# Patient Record
Sex: Male | Born: 1990 | Race: Black or African American | Hispanic: No | Marital: Single | Smoking: Never smoker
Health system: Southern US, Community
[De-identification: ages and names within clinical notes are randomized; demographics above are authoritative.]

## PROBLEM LIST (undated history)

## (undated) DIAGNOSIS — F959 Tic disorder, unspecified: Secondary | ICD-10-CM

## (undated) DIAGNOSIS — F79 Unspecified intellectual disabilities: Secondary | ICD-10-CM

## (undated) DIAGNOSIS — Q6689 Other  specified congenital deformities of feet: Secondary | ICD-10-CM

## (undated) DIAGNOSIS — F952 Tourette's disorder: Secondary | ICD-10-CM

## (undated) DIAGNOSIS — R6889 Other general symptoms and signs: Secondary | ICD-10-CM

## (undated) DIAGNOSIS — M419 Scoliosis, unspecified: Secondary | ICD-10-CM

## (undated) HISTORY — PX: EYE MUSCLE SURGERY: SHX370

## (undated) HISTORY — PX: FEMORAL HERNIA REPAIR: SHX632

---
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
## 2012-04-29 ENCOUNTER — Other Ambulatory Visit: Payer: Self-pay | Admitting: Family

## 2012-04-29 DIAGNOSIS — F988 Other specified behavioral and emotional disorders with onset usually occurring in childhood and adolescence: Secondary | ICD-10-CM

## 2012-04-29 MED ORDER — AMPHETAMINE-DEXTROAMPHET ER 30 MG PO CP24: ORAL_CAPSULE | ORAL | Status: DC

## 2012-04-29 NOTE — Telephone Encounter (Signed)
I called Mom, [name], and left her a message that Jurgen's prescription was ready to be picked up.

## 2012-07-17 ENCOUNTER — Other Ambulatory Visit: Payer: Self-pay

## 2012-07-17 DIAGNOSIS — F988 Other specified behavioral and emotional disorders with onset usually occurring in childhood and adolescence: Secondary | ICD-10-CM

## 2012-07-17 MED ORDER — AMPHETAMINE-DEXTROAMPHET ER 30 MG PO CP24: ORAL_CAPSULE | ORAL | Status: DC

## 2012-07-17 NOTE — Telephone Encounter (Signed)
[name] lvm stating that she was in the area taking pt to an appt and wanted to stop by and get Rx. Please call mom at [phone].

## 2012-07-23 ENCOUNTER — Telehealth: Payer: Self-pay

## 2012-07-23 NOTE — Telephone Encounter (Signed)
[name] asking if [name] could mail her the form so that she can apply for another handicap placard. She said that she has lost the placard. Please call [name] at [phone].

## 2012-07-24 NOTE — Telephone Encounter (Signed)
The form is ready. Please call and ask Mom if she wants it mailed to her or if she wants to pick it up. Thanks, Inetta Fermo

## 2012-07-25 NOTE — Telephone Encounter (Signed)
Thank you, I will mail it to her today. TG

## 2012-07-25 NOTE — Telephone Encounter (Signed)
I lvm asking mom to call and let us know.

## 2012-07-25 NOTE — Telephone Encounter (Signed)
Mom called back and would like the form mailed to her home, demographics are correct in the system.

## 2012-08-29 ENCOUNTER — Telehealth: Payer: Self-pay

## 2012-08-29 DIAGNOSIS — F988 Other specified behavioral and emotional disorders with onset usually occurring in childhood and adolescence: Secondary | ICD-10-CM

## 2012-08-29 MED ORDER — AMPHETAMINE-DEXTROAMPHET ER 30 MG PO CP24: ORAL_CAPSULE | ORAL | Status: DC

## 2012-08-29 NOTE — Telephone Encounter (Signed)
Left message on [name]'s answering machine letting her know that the Rx would be maield out Monday and that the letter would not be ready until next week.

## 2012-08-29 NOTE — Telephone Encounter (Addendum)
[name] lvm stating that she would like a Rx for generic Adderall mailed to her address, he is doing well on it. Would like a letter for Medicaid to pay for a motorized scooter for pt. With one leg being shorter then the other this will help him get around easier. Had CAP meeting yesterday and they said if she could get a letter from a provider at our office indicating why he needs it, Medicaid will pay for it. Marylene Land asked that the letter be mailed to her home when it is finished. She can be reached for any questions at [phone].

## 2012-09-02 ENCOUNTER — Encounter: Payer: Self-pay | Admitting: Family

## 2012-09-03 NOTE — Telephone Encounter (Signed)
09/03/2012 - mailed completed letter to home address as requested.

## 2012-09-22 ENCOUNTER — Telehealth: Payer: Self-pay

## 2012-09-22 DIAGNOSIS — G8310 Monoplegia of lower limb affecting unspecified side: Secondary | ICD-10-CM

## 2012-09-22 NOTE — Telephone Encounter (Signed)
Order has been placed. Marcelino Duster, would you follow up on this on Tuesday? Thanks, Inetta Fermo

## 2012-09-22 NOTE — Telephone Encounter (Signed)
Angie lvm stating that she spoke w [facility]. She said that they told her to call and have Dr.H fax over the order for pt to have PT eval. After the eval is complete, they will write the recommendation for pt to get motorized scooter. The fax number they gave her to have orders sent is [phone], the phone number is [phone]. Angie can be reached at [phone].

## 2012-09-23 NOTE — Telephone Encounter (Signed)
I spoke with Alcario Drought at [facility] and she confirmed that she was able to see the order that was put in for this patient by Sula Soda. MB

## 2012-09-26 ENCOUNTER — Other Ambulatory Visit: Payer: Self-pay | Admitting: Family

## 2012-09-26 DIAGNOSIS — G2569 Other tics of organic origin: Secondary | ICD-10-CM

## 2012-09-30 ENCOUNTER — Telehealth: Payer: Self-pay

## 2012-09-30 DIAGNOSIS — G2569 Other tics of organic origin: Secondary | ICD-10-CM

## 2012-09-30 MED ORDER — CLONIDINE HCL 0.1 MG PO TABS: ORAL_TABLET | ORAL | Status: DC

## 2012-09-30 NOTE — Telephone Encounter (Signed)
Rx was sent in electronically. TG 

## 2012-09-30 NOTE — Telephone Encounter (Signed)
[name], mom, called me back and scheduled appt for 10/07/12. I told her to check with her pharmacy later today for the refill.

## 2012-09-30 NOTE — Telephone Encounter (Signed)
[name] lvm stating that pt needs refill on Clonidine and that she will p/u when ready. She also stated that she knew he was due for an office visit and asked that it be scheduled on a Monday or Wednesday after 3:00 om. Called mom and lvm letting her know that Inetta Fermo does not have appts after 3:00 pm on those days but does have a few 2:45 pm appts on Thursdays. I asked her to call me so that we can schedule the appt.

## 2012-10-07 ENCOUNTER — Ambulatory Visit (INDEPENDENT_AMBULATORY_CARE_PROVIDER_SITE_OTHER): Payer: Medicaid Other | Admitting: Family

## 2012-10-07 ENCOUNTER — Encounter: Payer: Self-pay | Admitting: Family

## 2012-10-07 VITALS — BP 134/84 | HR 82 | Ht <= 58 in | Wt 141.0 lb

## 2012-10-07 DIAGNOSIS — G8310 Monoplegia of lower limb affecting unspecified side: Secondary | ICD-10-CM

## 2012-10-07 DIAGNOSIS — G4700 Insomnia, unspecified: Secondary | ICD-10-CM

## 2012-10-07 DIAGNOSIS — H905 Unspecified sensorineural hearing loss: Secondary | ICD-10-CM

## 2012-10-07 DIAGNOSIS — G2569 Other tics of organic origin: Secondary | ICD-10-CM

## 2012-10-07 DIAGNOSIS — F988 Other specified behavioral and emotional disorders with onset usually occurring in childhood and adolescence: Secondary | ICD-10-CM

## 2012-10-07 NOTE — Progress Notes (Signed)
Patient: [name] MRN: [id number] Sex: male DOB: [birth year]  Provider: Elveria Rising, NP Location of Care: [facility]  Note type: Routine return visit  History of Present Illness: Referral Source: Dr. 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 His tics have not been problematic. He is taking Clonidine for the motor tics and for difficulty sleeping. He takes Adderall for his attentional difficulties.  He lives in an apartment with a roommate and is supervised by a Merchandiser, retail. He uses [employer] transportation.  He is enrolled in a [employer] at a program called [employer].  He has been healthy since last seen.  [name] has difficulty with ambulation and is awaiting an evaluation at physical therapy for a motorized scooter.   Review of Systems: 12 system review was unremarkable  No past medical history on file. Hospitalizations: yes, Head Injury: no, Nervous System Infections: no, Immunizations up to date: yes Past Medical History Comments: has been hospitalized in the past for surgeries.  Surgical History Past Surgical History  Procedure Laterality Date  . Foot surgery Right 2002  . Eye muscle surgery Bilateral 1998 and 2002  . Femoral hernia repair      Done when he was an infant  . Harrington rod surgery  2003    For Scoliosis    Family History family history includes Breast cancer in his maternal grandmother; Prostate cancer in his maternal grandfather. Family History is negative migraines, seizures, cognitive impairment, blindness, deafness, birth defects, chromosomal disorder, autism.  Social History History    Social History  . Marital Status: Single    Spouse Name: N/A    Number of Children: N/A  . Years of Education: N/A   Social History Main Topics  . Smoking status: Never Smoker   . Smokeless tobacco: None  . Alcohol Use: No  . Drug Use: No  . Sexual Activity: None   Other Topics Concern  . None   Social History Narrative  . None   Educational level: enrolled in day program at [employer] Attending: Haroldine Laws  Occupation: Student  Living with maternal cousin  School comments [name] is taking Engineer, maintenance (IT) Studies at [employer].  Current Outpatient Prescriptions on File Prior to Visit  Medication Sig Dispense Refill  . amphetamine-dextroamphetamine (ADDERALL XR) 30 MG 24 hr capsule Take one by mouth every morning  30 capsule  0  . cloNIDine (CATAPRES) 0.1 MG tablet TAKE 3 TABLETS BY MOUTH EVERY NIGHT AT BEDTIME  90 tablet  0   No current facility-administered medications on file prior to visit.   The medication list was reviewed and reconciled. All changes or newly prescribed medications were explained.  A complete medication list was provided to the patient/caregiver.  No Known Allergies  Physical Exam BP 134/84  Pulse 82  Ht 4' 7.75" (1.416 m)  Wt 141 lb (63.957 kg)  BMI 31.9 kg/m2 General: alert, well developed, well nourished short statured young man, in no acute distress, right-handed Head: normocephalic, no dysmorphic features Ears, Nose and Throat: Otoscopic: tympanic membranes normal .  Pharynx: oropharynx is pink without exudates or tonsillar hypertrophy. Neck:  short neck, diminished range of motion, no cranial or cervical bruits Respiratory: auscultation clear Cardiovascular: no murmurs,  pulses are normal Musculoskeletal: right hemiatrophy most evident in the distal leg which is wasted. He wears shoes buildup. He is repaired thoracolumbar scoliosis Skin: no rashes or neurocutaneous lesions Trunk:  he has a healed gastrostomy scar in his abdomen..  Neurologic  Exam  Mental Status: alert; Oriented to place and name.He is talkative and attentive during the examination. His speech was rapid but he has no dysphasia or dysarthria Cranial Nerves: visual fields are full to double simultaneous stimuli; extraocular movements are full and conjugate; pupils are round reactive to light; funduscopic examination shows sharp disc margins with normal vessels; symmetric facial strength; midline tongue and uvula; hearing is normal and symmetric Motor: He has mild weakness in the right leg distally but fairly good proximal strength. He has normal strength In both arms and left leg. His fine motor skills are equal. 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 He has difficulty with ambulation and needs a scooter for mobility. He has an upcoming physical therapy evaluation for that. He will continue his medications without change and will return for follow up in 1 year or sooner if needed.

## 2012-10-09 ENCOUNTER — Encounter: Payer: Self-pay | Admitting: Family

## 2012-10-09 DIAGNOSIS — G8310 Monoplegia of lower limb affecting unspecified side: Secondary | ICD-10-CM | POA: Insufficient documentation

## 2012-10-09 DIAGNOSIS — G4700 Insomnia, unspecified: Secondary | ICD-10-CM | POA: Insufficient documentation

## 2012-10-09 DIAGNOSIS — H905 Unspecified sensorineural hearing loss: Secondary | ICD-10-CM | POA: Insufficient documentation

## 2012-10-09 DIAGNOSIS — F988 Other specified behavioral and emotional disorders with onset usually occurring in childhood and adolescence: Secondary | ICD-10-CM | POA: Insufficient documentation

## 2012-10-09 DIAGNOSIS — G2569 Other tics of organic origin: Secondary | ICD-10-CM | POA: Insufficient documentation

## 2012-10-09 NOTE — Patient Instructions (Signed)
Continue your medications without change. Let me know if you need anything else to help get the motorized scooter.  Plan to return for follow up in 1 year or sooner if needed.

## 2012-10-24 ENCOUNTER — Telehealth: Payer: Self-pay

## 2012-10-24 DIAGNOSIS — F988 Other specified behavioral and emotional disorders with onset usually occurring in childhood and adolescence: Secondary | ICD-10-CM

## 2012-10-24 MED ORDER — AMPHETAMINE-DEXTROAMPHET ER 30 MG PO CP24: ORAL_CAPSULE | ORAL | Status: DC

## 2012-10-24 NOTE — Telephone Encounter (Signed)
[name], mom, lvm asking for Rx for generic Adderall be mailed to the home. I called mom and lvm to let her know we received the message and will be mailing it on Monday.

## 2012-11-06 ENCOUNTER — Other Ambulatory Visit: Payer: Self-pay

## 2012-11-06 DIAGNOSIS — G2569 Other tics of organic origin: Secondary | ICD-10-CM

## 2012-11-06 MED ORDER — CLONIDINE HCL 0.1 MG PO TABS: ORAL_TABLET | ORAL | Status: DC

## 2012-11-06 NOTE — Telephone Encounter (Signed)
Angie lvm asking for refills on child's Clonidine 0.1 mg. I called mom and told her to check with her pharmacy later today.

## 2012-11-27 ENCOUNTER — Ambulatory Visit: Payer: Self-pay | Admitting: Rehabilitative and Restorative Service Providers"

## 2012-12-09 ENCOUNTER — Telehealth: Payer: Self-pay

## 2012-12-09 DIAGNOSIS — F988 Other specified behavioral and emotional disorders with onset usually occurring in childhood and adolescence: Secondary | ICD-10-CM

## 2012-12-09 MED ORDER — AMPHETAMINE-DEXTROAMPHET ER 30 MG PO CP24: ORAL_CAPSULE | ORAL | Status: DC

## 2012-12-09 NOTE — Telephone Encounter (Signed)
Angie called and would like Rx for child's generic Adderall mailed to her home. He is doing well on it. I told her that it would be mailed today.

## 2013-01-15 ENCOUNTER — Telehealth: Payer: Self-pay

## 2013-01-15 DIAGNOSIS — F988 Other specified behavioral and emotional disorders with onset usually occurring in childhood and adolescence: Secondary | ICD-10-CM

## 2013-01-15 MED ORDER — AMPHETAMINE-DEXTROAMPHET ER 30 MG PO CP24: ORAL_CAPSULE | ORAL | Status: DC

## 2013-01-15 NOTE — Telephone Encounter (Signed)
[name], mom, lvm stating that pt needed Rx for generic Adderall XR 30 mg. She said that he is doing well on it. I will call mom when ready for p/u at [phone].

## 2013-01-15 NOTE — Telephone Encounter (Signed)
I called [name] and let her know Rx was ready for p/u and told her our hours of operations.

## 2013-01-30 ENCOUNTER — Encounter (HOSPITAL_COMMUNITY): Payer: Self-pay | Admitting: Emergency Medicine

## 2013-01-30 ENCOUNTER — Emergency Department (HOSPITAL_COMMUNITY): Payer: Medicaid Other

## 2013-01-30 ENCOUNTER — Telehealth: Payer: Self-pay | Admitting: *Deleted

## 2013-01-30 ENCOUNTER — Inpatient Hospital Stay (HOSPITAL_COMMUNITY)
Admission: EM | Admit: 2013-01-30 | Discharge: 2013-02-01 | DRG: 071 | Disposition: A | Discharge status: Home / Self Care | Payer: Medicaid Other | Attending: Internal Medicine | Admitting: Internal Medicine

## 2013-01-30 DIAGNOSIS — Q7649 Other congenital malformations of spine, not associated with scoliosis: Secondary | ICD-10-CM

## 2013-01-30 DIAGNOSIS — R4182 Altered mental status, unspecified: Secondary | ICD-10-CM

## 2013-01-30 DIAGNOSIS — M4120 Other idiopathic scoliosis, site unspecified: Secondary | ICD-10-CM | POA: Diagnosis present

## 2013-01-30 DIAGNOSIS — G9340 Encephalopathy, unspecified: Principal | ICD-10-CM | POA: Diagnosis present

## 2013-01-30 DIAGNOSIS — I10 Essential (primary) hypertension: Secondary | ICD-10-CM | POA: Diagnosis present

## 2013-01-30 DIAGNOSIS — Q6689 Other  specified congenital deformities of feet: Secondary | ICD-10-CM

## 2013-01-30 DIAGNOSIS — G4700 Insomnia, unspecified: Secondary | ICD-10-CM | POA: Diagnosis present

## 2013-01-30 DIAGNOSIS — F988 Other specified behavioral and emotional disorders with onset usually occurring in childhood and adolescence: Secondary | ICD-10-CM | POA: Diagnosis present

## 2013-01-30 DIAGNOSIS — F952 Tourette's disorder: Secondary | ICD-10-CM | POA: Diagnosis present

## 2013-01-30 DIAGNOSIS — G8310 Monoplegia of lower limb affecting unspecified side: Secondary | ICD-10-CM

## 2013-01-30 DIAGNOSIS — R030 Elevated blood-pressure reading, without diagnosis of hypertension: Secondary | ICD-10-CM

## 2013-01-30 DIAGNOSIS — F411 Generalized anxiety disorder: Secondary | ICD-10-CM | POA: Diagnosis present

## 2013-01-30 HISTORY — DX: Scoliosis, unspecified: M41.9

## 2013-01-30 HISTORY — DX: Other specified congenital deformities of feet: Q66.89

## 2013-01-30 HISTORY — DX: Other general symptoms and signs: R68.89

## 2013-01-30 HISTORY — DX: Tourette's disorder: F95.2

## 2013-01-30 HISTORY — DX: Tic disorder, unspecified: F95.9

## 2013-01-30 LAB — CBC WITH DIFFERENTIAL/PLATELET
Basophils Relative: 0 % (ref 0–1)
Eosinophils Absolute: 0 10*3/uL (ref 0.0–0.7)
Eosinophils Relative: 0 % (ref 0–5)
Lymphocytes Relative: 15 % (ref 12–46)
Lymphs Abs: 1.5 10*3/uL (ref 0.7–4.0)
MCH: 30.4 pg (ref 26.0–34.0)
MCHC: 33.2 g/dL (ref 30.0–36.0)
MCV: 91.6 fL (ref 78.0–100.0)
Monocytes Relative: 10 % (ref 3–12)
Neutro Abs: 7.2 10*3/uL (ref 1.7–7.7)
Neutrophils Relative %: 74 % (ref 43–77)
Platelets: 258 10*3/uL (ref 150–400)
RDW: 12.6 % (ref 11.5–15.5)
WBC: 9.7 10*3/uL (ref 4.0–10.5)

## 2013-01-30 LAB — COMPREHENSIVE METABOLIC PANEL
ALT: 11 U/L (ref 0–53)
Albumin: 4.6 g/dL (ref 3.5–5.2)
BUN: 13 mg/dL (ref 6–23)
Calcium: 9.3 mg/dL (ref 8.4–10.5)
Chloride: 101 mEq/L (ref 96–112)
GFR calc Af Amer: >90 mL/min (ref >90)
GFR calc non Af Amer: >90 mL/min (ref >90)
Glucose, Bld: 88 mg/dL (ref 70–99)
Potassium: 3.9 mEq/L (ref 3.5–5.1)
Sodium: 139 mEq/L (ref 135–145)
Total Protein: 7.9 g/dL (ref 6.0–8.3)

## 2013-01-30 LAB — GLUCOSE, CAPILLARY: Glucose-Capillary: 89 mg/dL (ref 70–99)

## 2013-01-30 MED ORDER — VANCOMYCIN HCL 1000 MG IV SOLR: 15.0000 mg/kg | Freq: Once | INTRAVENOUS | Status: DC

## 2013-01-30 MED ORDER — HYDROMORPHONE HCL PF 1 MG/ML IJ SOLN: 0.5000 mg | INTRAMUSCULAR | Status: DC | PRN

## 2013-01-30 MED ORDER — DEXTROSE 5 % IV SOLN
2.0000 g | Freq: Once | INTRAVENOUS | Status: AC
  Administered 2013-01-30: 2 g via INTRAVENOUS
  Filled 2013-01-30 (×2): qty 2

## 2013-01-30 MED ORDER — VANCOMYCIN HCL IN DEXTROSE 1-5 GM/200ML-% IV SOLN
1000.0000 mg | Freq: Once | INTRAVENOUS | Status: DC
  Filled 2013-01-30: qty 200

## 2013-01-30 MED ORDER — MIDAZOLAM HCL 5 MG/ML IJ SOLN: 2.5000 mg | Freq: Once | INTRAMUSCULAR | Status: DC

## 2013-01-30 MED ORDER — HYDRALAZINE HCL 20 MG/ML IJ SOLN: 10.0000 mg | Freq: Four times a day (QID) | INTRAMUSCULAR | Status: DC | PRN

## 2013-01-30 MED ORDER — DEXTROSE 5 % IV SOLN
2.0000 g | INTRAVENOUS | Status: DC
  Filled 2013-01-30: qty 2

## 2013-01-30 MED ORDER — OXYCODONE HCL 5 MG PO TABS: 5.0000 mg | ORAL_TABLET | ORAL | Status: DC | PRN

## 2013-01-30 MED ORDER — ACETAMINOPHEN 325 MG PO TABS: 650.0000 mg | ORAL_TABLET | Freq: Four times a day (QID) | ORAL | Status: DC | PRN

## 2013-01-30 MED ORDER — ONDANSETRON HCL 4 MG/2ML IJ SOLN: 4.0000 mg | Freq: Four times a day (QID) | INTRAMUSCULAR | Status: DC | PRN

## 2013-01-30 MED ORDER — LORAZEPAM 2 MG/ML IJ SOLN: 2.0000 mg | Freq: Once | INTRAMUSCULAR | Status: DC

## 2013-01-30 MED ORDER — SODIUM CHLORIDE 0.9 % IV BOLUS (SEPSIS)
1000.0000 mL | Freq: Once | INTRAVENOUS | Status: AC
  Administered 2013-01-30: 1000 mL via INTRAVENOUS

## 2013-01-30 MED ORDER — LORAZEPAM 2 MG/ML IJ SOLN
1.0000 mg | Freq: Once | INTRAMUSCULAR | Status: AC
  Administered 2013-01-30: 1 mg via INTRAVENOUS
  Filled 2013-01-30: qty 1

## 2013-01-30 MED ORDER — ACETAMINOPHEN 650 MG RE SUPP: 650.0000 mg | Freq: Four times a day (QID) | RECTAL | Status: DC | PRN

## 2013-01-30 MED ORDER — DEXTROSE-NACL 5-0.45 % IV SOLN
INTRAVENOUS | Status: DC
  Administered 2013-01-30: 1000 mL via INTRAVENOUS

## 2013-01-30 MED ORDER — ONDANSETRON HCL 4 MG PO TABS: 4.0000 mg | ORAL_TABLET | Freq: Four times a day (QID) | ORAL | Status: DC | PRN

## 2013-01-30 MED ORDER — VANCOMYCIN HCL IN DEXTROSE 750-5 MG/150ML-% IV SOLN
750.0000 mg | Freq: Three times a day (TID) | INTRAVENOUS | Status: DC
  Administered 2013-01-31 – 2013-02-01 (×5): 750 mg via INTRAVENOUS
  Filled 2013-01-30 (×8): qty 150

## 2013-01-30 MED ORDER — ALUM & MAG HYDROXIDE-SIMETH 200-200-20 MG/5ML PO SUSP: 30.0000 mL | Freq: Four times a day (QID) | ORAL | Status: DC | PRN

## 2013-01-30 MED ORDER — ACYCLOVIR SODIUM 50 MG/ML IV SOLN
500.0000 mg | Freq: Three times a day (TID) | INTRAVENOUS | Status: DC
  Administered 2013-01-30 – 2013-02-01 (×5): 500 mg via INTRAVENOUS
  Filled 2013-01-30 (×8): qty 10

## 2013-01-30 NOTE — Progress Notes (Signed)
ANTIBIOTIC CONSULT NOTE - INITIAL  Pharmacy Consult for Vancomycin  Indication: R/O meningitis  No Known Allergies  Patient Measurements: Height: 4' 7.91" (142 cm) Weight: 141 lb 1.5 oz (64 kg) IBW/kg (Calculated) : 40.58  Vital Signs: Temp: 98.1 F (36.7 C) (12/26 2100) Temp src: Axillary (12/26 2100) BP: 142/90 mmHg (12/26 2100) Pulse Rate: 120 (12/26 2100)  Labs:  Recent Labs  01/30/13 1702  WBC 9.7  HGB 16.3  PLT 258  CREATININE 0.78   Estimated Creatinine Clearance: 102.4 ml/min (by C-G formula based on Cr of 0.78). No results found for this basename: VANCOTROUGH, VANCOPEAK, VANCORANDOM, GENTTROUGH, GENTPEAK, GENTRANDOM, TOBRATROUGH, TOBRAPEAK, TOBRARND, AMIKACINPEAK, AMIKACINTROU, AMIKACIN,  in the last 72 hours   Medical History: Past Medical History  Diagnosis Date  . Tourette's syndrome   . Tic disorder   . Scoliosis   . Hemiatrophy of right leg   . Club foot     Right   Assessment: 22 y/o M with AMS, to start empiric antibiotics for r/o meningitis. Was supposed to receive 1000 mg Vancomycin in the ED, but never received. Labs as above.   Goal of Therapy:  Vancomycin trough level 15-20 mcg/ml  Plan:  -Will start vancomycin 750 mg IV q8h -Ceftriaxone per MD (would recommend change to q12h from q24h) -Acyclovir per previous note -Drug levels as indicated  Thank you for allowing me to take part in this patient's care,  Abran Duke, PharmD Clinical Pharmacist Phone: [phone] Pager: [phone] 01/30/2013 11:30 PM

## 2013-01-30 NOTE — Telephone Encounter (Signed)
[name] the patient's mom called and stated that she has taken him to [facility] and he has just completed a CT head, his blood pressure was a little high and they are waiting for the doctor to come back in. Mom states that on yesterday he was complaining of eye pain so she got him some eye drops and then today about 3:30 pm or so his motor skill began to decline rapidly, he was unable to walk his eyes were rolling back in his head. Mom was calling as an FYI for Dr. Sharene Skeans in case the hospital tried to contact him about this he would already be aware. [name] can be reached at [phone] if Dr. Sharene Skeans would like to speak with her regarding this matter.     Thanks,  Belenda Cruise.

## 2013-01-30 NOTE — H&P (Signed)
[facility] History and Physical  [name] ZOX:[id number] DOB: [birth year] DOA: 01/30/2013  Referring physician:   EDP PCP: Provider Not In System  Specialists:  Dr. Amada Jupiter Neurology  Chief Complaint:  Decreased Responsiveness  HPI: [name] is a 22 y.o. male with a history of Tourettes Syndrome who was brought to the ED due to sudden onset of "babbling" and not making sense followed by complaints that he felt like he was drowning and choking.    Then over time in the afternoon , he had a decrease in responsiveness described by his family as not being able to verbalize and not being able to walk or move.   When he was attempting to walk he was described as tightening up all over his body, and he appeared by report to have small seizure like activity.  Per his mother he has not had any history of seizures, and he has not had fevers or chills.  Yesterday, he did have complaints of eye pain.  He was taken to the ED and his Neurologist was contacted Dr. Sharene Skeans and he was seen by Neurology in the ED, by Dr. Amada Jupiter.   A chest X-ray and Ct scan of the Head were performed and were negative for acute findings.  An LP was attempted by the EDp and radiology to no avail and patient was placed on Empiric Antibiotic therapy of IV Vancomycin Rocephin and Acyclovir.      Review of Systems: The patient denies anorexia, fever, chills, headaches, weight loss, vision loss, diplopia, dizziness, decreased hearing, rhinitis, hoarseness, chest pain, syncope, dyspnea on exertion, peripheral edema, balance deficits, cough, hemoptysis, abdominal pain, nausea, vomiting, diarrhea, constipation, hematemesis, melena, hematochezia, severe indigestion/heartburn, dysuria, hematuria, incontinence, muscle weakness, suspicious skin lesions, transient blindness, difficulty walking, depression, unusual weight change, abnormal bleeding, enlarged lymph nodes, angioedema, and breast masses.    Past Medical History:      Tourette's Syndrome  Tic Disorder  Right Hemiatrophy  Scoliosis  Right Club Foot  Insomnia     Past Surgical History  Procedure Laterality Date  . Foot surgery Right 2002  . Eye muscle surgery Bilateral 1998 and 2002  . Femoral hernia repair      Done when he was an infant  . Harrington rod surgery  2003    For Scoliosis    Prior to Admission medications   Medication Sig Start Date End Date Taking? Authorizing Provider  amphetamine-dextroamphetamine (ADDERALL XR) 30 MG 24 hr capsule Take one by mouth every morning 01/15/13  Yes Elveria Rising, NP  cloNIDine (CATAPRES) 0.1 MG tablet Take 0.3 mg by mouth at bedtime.   Yes Historical Provider, MD  Melatonin 5 MG TABS Take 5 mg by mouth at bedtime.   Yes Historical Provider, MD  Multiple Vitamin (MULTIVITAMIN WITH MINERALS) TABS tablet Take 1 tablet by mouth daily.   Yes Historical Provider, MD  Tetrahydroz-Glyc-Hyprom-PEG (VISINE MAXIMUM REDNESS RELIEF) 0.05-0.2-0.36-1 % SOLN Apply 2 drops to eye daily as needed (for dry eyes).   Yes Historical Provider, MD    No Known Allergies  Social History:  reports that he has never smoked. He does not have any smokeless tobacco history on file. He reports that he does not drink alcohol or use illicit drugs.     Family History  Problem Relation Age of Onset  . Breast cancer Maternal Grandmother     Died in her mid 45's  . Prostate cancer Maternal Grandfather     Died in his 54's    (  be sure to complete)   Physical Exam:  GEN:  Pleasant  22 y.o. male  examined  and in no acute distress; cooperative with exam Filed Vitals:   01/30/13 1900 01/30/13 1930 01/30/13 2000 01/30/13 2100  BP:  86/54 96/80 142/90  Pulse:  130 142 120  Temp:    98.1 F (36.7 C)  TempSrc:    Axillary  Resp:  24 30 22   Height: 4' 7.91" (1.42 m)     Weight: 64 kg (141 lb 1.5 oz)     SpO2:  94% 95% 96%   Blood pressure 142/90, pulse 120, temperature 98.1 F (36.7 C), temperature source Axillary,  resp. rate 22, height 4' 7.91" (1.42 m), weight 64 kg (141 lb 1.5 oz), SpO2 96.00%. PSYCH: He is alert and oriented x3; does not appear anxious does not appear depressed; affect is normal HEENT: Normocephalic and Atraumatic, Mucous membranes pink; PERRLA; EOM intact; Fundi:  Benign;  No scleral icterus, Nares: Patent, Oropharynx: Clear,  Fair Dentition, Neck:  FROM, no cervical lymphadenopathy nor thyromegaly or carotid bruit; no JVD; Breasts:: Not examined CHEST WALL: No tenderness CHEST: Normal respiration, clear to auscultation bilaterally HEART: Regular rate and rhythm; no murmurs rubs or gallops BACK: No kyphosis or scoliosis; no CVA tenderness ABDOMEN: Positive Bowel Sounds, soft non-tender; no masses, no organomegaly. Rectal Exam: Not done EXTREMITIES: No cyanosis, clubbing or edema; no ulcerations. Genitalia: not examined PULSES: 2+ and symmetric SKIN: Normal hydration no rash or ulceration CNS: Cranial nerves 2-12 grossly intact no focal neurologic deficit except for deafness in right Ear    Labs on Admission:  Basic Metabolic Panel:  Recent Labs Lab 01/30/13 1702  NA 139  K 3.9  CL 101  CO2 24  GLUCOSE 88  BUN 13  CREATININE 0.78  CALCIUM 9.3   Liver Function Tests:  Recent Labs Lab 01/30/13 1702  AST 20  ALT 11  ALKPHOS 68  BILITOT 0.3  PROT 7.9  ALBUMIN 4.6   No results found for this basename: LIPASE, AMYLASE,  in the last 168 hours No results found for this basename: AMMONIA,  in the last 168 hours CBC:  Recent Labs Lab 01/30/13 1702  WBC 9.7  NEUTROABS 7.2  HGB 16.3  HCT 49.1  MCV 91.6  PLT 258   Cardiac Enzymes: No results found for this basename: CKTOTAL, CKMB, CKMBINDEX, TROPONINI,  in the last 168 hours  BNP (last 3 results) No results found for this basename: PROBNP,  in the last 8760 hours CBG:  Recent Labs Lab 01/30/13 1532  GLUCAP 89    Radiological Exams on Admission: Ct Head Wo Contrast  01/30/2013   CLINICAL DATA:   Altered mental status, slurred speech  EXAM: CT HEAD WITHOUT CONTRAST  TECHNIQUE: Contiguous axial images were obtained from the base of the skull through the vertex without intravenous contrast.  COMPARISON:  None.  FINDINGS: The ventricles are normal in size and position. There is no intracranial hemorrhage nor intracranial mass effect. There is no evidence of an evolving ischemic infarction. The cerebellum and brainstem are normal in density. There are no abnormal intracranial calcifications.  At bone window settings the observed portions of the paranasal sinuses and mastoid air cells are clear. There is no evidence of an acute skull fracture.  IMPRESSION: There is no evidence of an acute intracranial abnormality.   Electronically Signed   By: David  [place]   On: 01/30/2013 16:23   Dg Chest Portable 1 View  01/30/2013  CLINICAL DATA:  Altered mental status.  EXAM: PORTABLE CHEST - 1 VIEW  COMPARISON:  None.  FINDINGS: There are extensive changes from spinal surgery with long stabilization rods spanning from the cervical thoracic junction tube below the included field of view. There are rib deformities, which may be developmental, postsurgical or a combination.  Cardiac silhouette is normal in overall size and configuration. The mediastinum is normal in contour. The lungs are clear.  IMPRESSION: No acute cardiopulmonary disease.   Electronically Signed   By: Amie Portland M.D.   On: 01/30/2013 17:19   Dg Fluoro Guide Lumbar Puncture  01/30/2013   Chauncey Fischer, MD     01/30/2013  9:29 PM Attempted fluoroscopic lumbar puncture.  I prepped and drapped over the lumbar spine.  I anesthetized and  attempted to access the spinal canal at L4-5 and L2-3.  It was  very difficult to identify landmarks after fusion.  I was unable  to reach the spinal canal. It was unclear if the spine is fused  with bone graft over the lamina.  The procedure was discontinued to protect the patient.  I spoke with Dr. Mort Sawyers and recommended a CT lumbar spine  without contrast to evaluate the post fusion anatomy.  If there  is a window through the bone, LP in Interventional Radiology is  recommended to accommodate the angles needed to access the canal.         Assessment/Plan Principal Problem:   Acute encephalopathy Active Problems:   Altered mental status   Tourettes syndrome   Blood pressure elevated    1.  Acute Encephalopathy- admitted to telemetry bed, and Seizure Precautions,  Neuro checks ordered q 2 hours, and placed on Empiric IV Abxs for Possible Encephalitis with IV Vancomycin, Rocephin and Acyclovir.      Dr. Amada Jupiter of Neurology saw in ED.   MRI of brain to eval for ?Encephalitis,  and EEG in AM to eval for possible Seizures.   For CT scan of Lumbar Spine to evaluate for access for LP.  IR to see in AM for LP if indicated as possible by CT scan.     2.  Tourette's Syndrome- Takes Adderall , and see Neurologist Dr. Sharene Skeans as an Outpatient.    3.  Elevated Blood Pressures- Monitor, and IV hydralazine PRN.  He takes Clonidine 0.3 mg PO qhs.     4.  Insomnia-  On Melatonin as outpt.     5.  DVT prophylaxis with SCDs.         Code Status:    FULL CODE Family Communication:   Mother and Sister at Bedside  Disposition Plan:       Inpatient  Time spent:  60 MInutes  Ron Parker [facility] Pager [phone]  If 7PM-7AM, please contact night-coverage [email or web address] Password [facility] 01/30/2013, 10:42 PM

## 2013-01-30 NOTE — Telephone Encounter (Addendum)
I reviewed the CT scan of the brain, and to my surprise it is quite normal given his right hemiatrophy.  I spoke with the emergency room physician, and he has requested a consultation from the neurohospitalist Dr. Amada Jupiter.  I will be available at [phone].  I agree with the plans to work him up for delirium.  It's possible that he is having seizures, but he's never had them before.  At baseline he is a talkative young man with motor tic disorder, right hemi-paresis and hemiatrophy.  I called mother to inform her that I am aware of his condition, and that a neurologist will come to see her son.

## 2013-01-30 NOTE — Progress Notes (Signed)
Report taken from John RN in ED at 2015, pt in IR attempting LP. Room ready and awaiting arrival of pt.

## 2013-01-30 NOTE — ED Notes (Signed)
Pt taken to IR

## 2013-01-30 NOTE — ED Provider Notes (Signed)
CSN: [id number]     Arrival date & time 01/30/13  1449 History   First MD Initiated Contact with Patient 01/30/13 1552     Chief Complaint  Patient presents with  . Altered Mental Status   (Consider location/radiation/quality/duration/timing/severity/associated sxs/prior Treatment) HPI Comments: 22 year old male with a history of Tourette's syndrome presents with gradually worsening mental status over the last 2 days. Family knows that he was talking about strange things, such as being afraid of water, feel like he traveling, and being afraid of dogs. These are not things he typically states. Typically he is able to have a normal conversation and is able to go to college. He has had progressive confusion over these last couple days. No fevers noted. Not had any cough. Today he has also been having more trouble speaking and her last couple hours had intermittent episodes of stiffening of his legs were is unable to walk. He also closes eyes like he is having a seizure. Patient is unable to provide any history due to mental status.   History reviewed. No pertinent past medical history. Past Surgical History  Procedure Laterality Date  . Foot surgery Right 2002  . Eye muscle surgery Bilateral 1998 and 2002  . Femoral hernia repair      Done when he was an infant  . Harrington rod surgery  2003    For Scoliosis   Family History  Problem Relation Age of Onset  . Breast cancer Maternal Grandmother     Died in her mid 76's  . Prostate cancer Maternal Grandfather     Died in his 108's   History  Substance Use Topics  . Smoking status: Never Smoker   . Smokeless tobacco: Not on file  . Alcohol Use: No    Review of Systems  Unable to perform ROS: Mental status change    Allergies  Review of patient's allergies indicates no known allergies.  Home Medications   Current Outpatient Rx  Name  Route  Sig  Dispense  Refill  . amphetamine-dextroamphetamine (ADDERALL XR) 30 MG 24 hr  capsule      Take one by mouth every morning   30 capsule   0   . cloNIDine (CATAPRES) 0.1 MG tablet      TAKE 3 TABLETS BY MOUTH EVERY NIGHT AT BEDTIME   90 tablet   5   . Melatonin 5 MG TABS   Oral   Take 5 mg by mouth at bedtime.          BP 160/94  Pulse 115  Temp(Src) 98.8 F (37.1 C) (Oral)  Resp 20 Physical Exam  Nursing note and vitals reviewed. Constitutional: He is oriented to person, place, and time.  HENT:  Head: Normocephalic and atraumatic.  Right Ear: External ear normal.  Left Ear: External ear normal.  Nose: Nose normal.  Eyes: EOM are normal. Pupils are equal, round, and reactive to light. Right eye exhibits no discharge. Left eye exhibits no discharge.  Neck: Neck supple.  Cardiovascular: Normal rate, regular rhythm, normal heart sounds and intact distal pulses.   Pulmonary/Chest: Tachypnea noted.  Abdominal: Soft. There is no tenderness.  Musculoskeletal: He exhibits no edema.  Neurological: He is alert and oriented to person, place, and time. No cranial nerve deficit or sensory deficit. He exhibits normal muscle tone.  Diffuse weakness, worse in his lower extremities. However he is able to grossly move all 4 extremities. Patient nonverbal but follows commands.  Skin: Skin is warm and dry.  ED Course  Procedures (including critical care time) Labs Review Labs Reviewed  CSF CULTURE  GRAM STAIN  HERPES SIMPLEX VIRUS CULTURE  CULTURE, BLOOD (ROUTINE X 2)  CULTURE, BLOOD (ROUTINE X 2)  GLUCOSE, CAPILLARY  CBC WITH DIFFERENTIAL  COMPREHENSIVE METABOLIC PANEL  URINALYSIS, ROUTINE W REFLEX MICROSCOPIC  URINE RAPID DRUG SCREEN (HOSP PERFORMED)  CSF CELL COUNT WITH DIFFERENTIAL  CSF CELL COUNT WITH DIFFERENTIAL  GLUCOSE, CSF  PROTEIN, CSF  CG4 I-STAT (LACTIC ACID)   Imaging Review Ct Head Wo Contrast  01/30/2013   CLINICAL DATA:  Altered mental status, slurred speech  EXAM: CT HEAD WITHOUT CONTRAST  TECHNIQUE: Contiguous axial images were  obtained from the base of the skull through the vertex without intravenous contrast.  COMPARISON:  None.  FINDINGS: The ventricles are normal in size and position. There is no intracranial hemorrhage nor intracranial mass effect. There is no evidence of an evolving ischemic infarction. The cerebellum and brainstem are normal in density. There are no abnormal intracranial calcifications.  At bone window settings the observed portions of the paranasal sinuses and mastoid air cells are clear. There is no evidence of an acute skull fracture.  IMPRESSION: There is no evidence of an acute intracranial abnormality.   Electronically Signed   By: David  [place]   On: 01/30/2013 16:23   Dg Chest Portable 1 View  01/30/2013   CLINICAL DATA:  Altered mental status.  EXAM: PORTABLE CHEST - 1 VIEW  COMPARISON:  None.  FINDINGS: There are extensive changes from spinal surgery with long stabilization rods spanning from the cervical thoracic junction tube below the included field of view. There are rib deformities, which may be developmental, postsurgical or a combination.  Cardiac silhouette is normal in overall size and configuration. The mediastinum is normal in contour. The lungs are clear.  IMPRESSION: No acute cardiopulmonary disease.   Electronically Signed   By: Amie Portland M.D.   On: 01/30/2013 17:19    EKG Interpretation   None       MDM   1. Altered mental status    The patient here has a nonfocal exam but does not have normal mental status. As her no localizing symptoms and has been on for over 2 days is not a code stroke. The patient has a low-grade temperature of 99.5 rectally, and with no other explanation I feel workup for encephalitis meningitis is indicated. There is no sign of pneumonia the patient has not given [place] a urine sample to suggest UTI. I attempted an LP briefly, but due to patient's physical status he is unable to bend his legs well and he has both a spinal fusion and scoliosis. After 2  attempts was unsuccessful in getting fluid. At this time and discussed with the hospitalist, Dr. Lovell Sheehan, who recommends the full meningitis encephalitis treatment with bank, Rocephin, and acyclovir. Will referr to fluoroscopy for LP under fluoroscopy guidance.    Audree Camel, MD 01/31/13 0100

## 2013-01-30 NOTE — ED Notes (Signed)
DR. Criss Alvine NOTIFIED OF PATIENTS LAB RESULTS OF CG4 LACTIC ACID  = 1.48 mmoI/L, @17 ;45 PM ,01/30/2013.

## 2013-01-30 NOTE — ED Notes (Signed)
Attempted heplock insertion X2 to Lt. Hand, unsuccessful, dressing applied bleeding controlled. Family at the bedside. IV nurse paged.  Dr. Petra Kuba at the bedside talking with the family.

## 2013-01-30 NOTE — ED Notes (Signed)
Per pt family, pt had sudden onset of altered mental status staring a few hours ago. 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 Pt very confused and talking about strange things. Denies any injuries. Pt having slurred speech.

## 2013-01-30 NOTE — Consult Note (Signed)
Neurology Consultation Reason for Consult: Altered mental status Referring Physician: Criss Alvine, S  CC: Altered mental status  History is obtained from: Family  HPI: Marquese Lara is a 22 y.o. male with a history of right hemiatrophy, scoliosis, dysphagia, ADD, tic disorder. At baseline, he is able to converse well. Yesterday, he began having repetitive speech including stating I'm sorry over and over. Today, had intermittent stiffening of his legs and has been unable to walk. He keeps his eyes tightly closed.  Of note, he has no history of seizures. His tics or motor tics.   ROS: A 14 point ROS was performed and is negative except as noted in the HPI.  PMH Right hemiatrophy Tic disorder ADD Right hemiatrophy Dysphagia requiring PEG  Family History: No history of seizures  Social History: Enrolled at the [employer]. program called beyond [employer] with a goal of being able to get a job.  Exam: Current vital signs: BP 96/80  Pulse 142  Temp(Src) 99.4 F (37.4 C) (Rectal)  Resp 30  Ht 4' 7.91" (1.42 m)  Wt 64 kg (141 lb 1.5 oz)  BMI 31.74 kg/m2  SpO2 95% Vital signs in last 24 hours: Temp:  [98.8 F (37.1 C)-99.4 F (37.4 C)] 99.4 F (37.4 C) (12/26 1725) Pulse Rate:  [108-142] 142 (12/26 2000) Resp:  [20-56] 30 (12/26 2000) BP: (86-179)/(54-100) 96/80 mmHg (12/26 2000) SpO2:  [93 %-97 %] 95 % (12/26 2000) Weight:  [64 kg (141 lb 1.5 oz)] 64 kg (141 lb 1.5 oz) (12/26 1900)  General: In bed, keeps eyes tightly closed CV: Regular rate and rhythm Mental Status: Patient is awake, but keeps eyes tightly closed. He does not speak at all during the interview. Cranial Nerves: II: Blinks to threat bilaterally. Pupils are equal, round, and reactive to light.  Discs are difficult to visualize. III,IV, VI: When holding his eyes open, he does look right and left on command. V/VII: Blinks to eyelid stimulation bilaterally VIII, X, XI, XII: Unable to assess secondary to patient's  altered mental status.  Motor: He does not comply with formal testing, but does follow commands to show 2 fingers in both the right and the left hand. He is able to withdrawal all extremities to noxious stimuli. Sensory: Sensation is symmetric to light touch and temperature in the arms and legs. Cerebellar: Patient does not comply with testing Gait: Not tested due to altered mental status  I have reviewed labs in epic and the results pertinent to this consultation are: CBC, CMP unremarkable  I have reviewed the images obtained: CT head-unremarkable  Impression: 22 year old male with history of right hemiatrophy, scoliosis, dysphagia, ADD, tic disorder who presents with progressive altered mental status over the past day. He is afebrile, though his temperature does read 99.4. I do think that infection needs to be ruled out and therefore recommended a lumbar puncture. I do not have a high suspicion for bacterial meningitis, but encephalitis is possible. Psychiatric component is possible but other possibilities need to be excluded prior to this being considered.  Recommendations: 1) lumbar puncture, it appears that ptosis is present then would pursue treatment with acyclovir until HSV by PCR returns 2) EEG in the morning 3) will continue to follow  Ritta Slot, MD [facility] [phone]  If 7pm- 7am, please page neurology on call at [phone].

## 2013-01-30 NOTE — Procedures (Signed)
Attempted fluoroscopic lumbar puncture.  I prepped and drapped over the lumbar spine.  I anesthetized and attempted to access the spinal canal at L4-5 and L2-3.  It was very difficult to identify landmarks after fusion.  I was unable to reach the spinal canal. It was unclear if the spine is fused with bone graft over the lamina.  The procedure was discontinued to protect the patient.  I spoke with Dr. Mort Sawyers and recommended a CT lumbar spine without contrast to evaluate the post fusion anatomy.  If there is a window through the bone, LP in Interventional Radiology is recommended to accommodate the angles needed to access the canal.

## 2013-01-30 NOTE — Progress Notes (Signed)
ANTIBIOTIC CONSULT NOTE - INITIAL  Pharmacy Consult for acyclovir Indication: r/o HSV encephalitis  No Known Allergies  Patient Measurements: Height: 4' 7.91" (142 cm) Weight: 141 lb 1.5 oz (64 kg) IBW/kg (Calculated) : 40.58 Adjusted Body Weight:   Vital Signs: Temp: 99.4 F (37.4 C) (12/26 1725) Temp src: Rectal (12/26 1725) BP: 174/96 mmHg (12/26 1815) Pulse Rate: 115 (12/26 1815) Intake/Output from previous day:   Intake/Output from this shift:    Labs:  Recent Labs  01/30/13 1702  WBC 9.7  HGB 16.3  PLT 258  CREATININE 0.78   Estimated Creatinine Clearance: 102.4 ml/min (by C-G formula based on Cr of 0.78). No results found for this basename: VANCOTROUGH, VANCOPEAK, VANCORANDOM, GENTTROUGH, GENTPEAK, GENTRANDOM, TOBRATROUGH, TOBRAPEAK, TOBRARND, AMIKACINPEAK, AMIKACINTROU, AMIKACIN,  in the last 72 hours   Microbiology: No results found for this or any previous visit (from the past 720 hour(s)).  Medical History: History reviewed. No pertinent past medical history.  Medications:  Anti-infectives   Start     Dose/Rate Route Frequency Ordered Stop   01/30/13 2100  acyclovir (ZOVIRAX) 500 mg in dextrose 5 % 100 mL IVPB     500 mg 110 mL/hr over 60 Minutes Intravenous Every 8 hours 01/30/13 2005     01/30/13 2015  vancomycin (VANCOCIN) IVPB 1000 mg/200 mL premix     1,000 mg 200 mL/hr over 60 Minutes Intravenous  Once 01/30/13 2005     01/30/13 2000  vancomycin (VANCOCIN) 15 mg/kg in sodium chloride 0.9 % 100 mL IVPB  Status:  Discontinued     15 mg/kg 100 mL/hr over 60 Minutes Intravenous  Once 01/30/13 1956 01/30/13 2004   01/30/13 2000  cefTRIAXone (ROCEPHIN) 2 g in dextrose 5 % 50 mL IVPB     2 g 100 mL/hr over 30 Minutes Intravenous  Once 01/30/13 1956       Assessment: 22 yom presented to the ED with AMS. To start empiric acyclovir for possible encephalitis. Also ordered 1x dose of vanc + ceftriaxone. Pt is afebrile and WBC is WNL. Pt with good  renal fxn.   Acyclovir 12/26>> Vanc 12/26 x 1 CTX 12/26 x 1  Goal of Therapy:  Eradication of infection  Plan:  1. Acyclovir 500mg  IV Q8H 2. F/u renal fxn, C&S, clinical status 3. F/u continuation of vancomycin + ceftriaxone   Selwyn Reason, Drake Leach 01/30/2013,8:06 PM

## 2013-01-30 NOTE — ED Notes (Signed)
Report Attempted

## 2013-01-31 ENCOUNTER — Inpatient Hospital Stay (HOSPITAL_COMMUNITY): Payer: Medicaid Other

## 2013-01-31 LAB — URINALYSIS, ROUTINE W REFLEX MICROSCOPIC
Bilirubin Urine: NEGATIVE
Glucose, UA: 500 mg/dL — AB
Leukocytes, UA: NEGATIVE
Nitrite: NEGATIVE
Specific Gravity, Urine: 1.02 (ref 1.005–1.030)
pH: 6 (ref 5.0–8.0)

## 2013-01-31 LAB — RAPID URINE DRUG SCREEN, HOSP PERFORMED
Amphetamines: POSITIVE — AB
Benzodiazepines: NOT DETECTED
Cocaine: NOT DETECTED

## 2013-01-31 LAB — CBC
Hemoglobin: 15.7 g/dL (ref 13.0–17.0)
MCH: 29.9 pg (ref 26.0–34.0)
MCHC: 33.3 g/dL (ref 30.0–36.0)
MCV: 89.7 fL (ref 78.0–100.0)
Platelets: 260 10*3/uL (ref 150–400)

## 2013-01-31 LAB — BASIC METABOLIC PANEL
BUN: 9 mg/dL (ref 6–23)
CO2: 27 mEq/L (ref 19–32)
Calcium: 8.8 mg/dL (ref 8.4–10.5)
GFR calc non Af Amer: >90 mL/min (ref >90)
Glucose, Bld: 120 mg/dL — ABNORMAL HIGH (ref 70–99)
Potassium: 3.5 mEq/L (ref 3.5–5.1)

## 2013-01-31 LAB — URINE MICROSCOPIC-ADD ON

## 2013-01-31 MED ORDER — CLONIDINE HCL 0.3 MG PO TABS
0.3000 mg | ORAL_TABLET | Freq: Every day | ORAL | Status: DC
  Filled 2013-01-31 (×3): qty 1

## 2013-01-31 MED ORDER — ADULT MULTIVITAMIN W/MINERALS CH
1.0000 | ORAL_TABLET | Freq: Every day | ORAL | Status: DC
  Administered 2013-01-31 – 2013-02-01 (×2): 1 via ORAL
  Filled 2013-01-31 (×2): qty 1

## 2013-01-31 MED ORDER — DEXTROSE 5 % IV SOLN
2.0000 g | Freq: Two times a day (BID) | INTRAVENOUS | Status: DC
  Administered 2013-01-31 – 2013-02-01 (×2): 2 g via INTRAVENOUS
  Filled 2013-01-31 (×5): qty 2

## 2013-01-31 MED ORDER — IOHEXOL 300 MG/ML  SOLN
100.0000 mL | Freq: Once | INTRAMUSCULAR | Status: AC | PRN
  Administered 2013-01-31: 100 mL via INTRAVENOUS

## 2013-01-31 NOTE — Progress Notes (Signed)
[facility] PROGRESS NOTE  [name] ZOX:[id number] DOB: 22-Aug-1990 DOA: 01/30/2013 PCP: Provider Not In System   Brief narrative 22 year old male with history of Tourette's syndrome, right hemiatrophy, scoliosis, ADD,  who was brought to the ED for change in mental status with decreased responsiveness and repetitive speech. Patient admitted for further workup Head CT was unremarkable. Chest x-ray was negative. Patient  had a low-grade temperature on presentation. An LP was attempted under fluoroscopy but was unsuccessful. Patient admitted for further workup.    Assessment/Plan: Acute encephalopathy Continue to monitor on telemetry. Seizure precautions. Neuro checks. Patient placed on empiric antibiotic with IV vancomycin, Rocephin and acyclovir for possible encephalitis. CT of the lumbar spine done to evaluate for access for he has fluoroscopy guided therapy was unsuccessful initially. As per radiologist, patient has thoracolumbar dextroscoliosis with multiple segmentation anomalies and solid osseous fusion throughout the thoracic and lumbar spine was does not allow any space for LP. -Discussed with neurology. Plan for MRI been with contrast and following EEG -Ativan when necessary for seizures  Tourette's syndrome Patient on adderall as outpt. Follows with Dr Sharene Skeans.  HTN Prn hydralazine. He is on clonidine 0.3 mg qhs which i will resume   Code Status: Full code Family Communication: Cousin  the bedside Disposition Plan: Home once improved and workup completed   Consultants:  Neurology  Procedures:  LP  Antibiotics:  IV vancomycin, Rocephin and acyclovir  HPI/Subjective: Patient seen and examined this morning. Denies any speech impairment or weakness.  Objective: Filed Vitals:   01/31/13 0851  BP: 152/92  Pulse: 120  Temp: 97.7 F (36.5 C)  Resp: 20    Intake/Output Summary (Last 24 hours) at 01/31/13 1254 Last data filed at 01/31/13 8119  Gross  per 24 hour  Intake    120 ml  Output    125 ml  Net     -5 ml   Filed Weights   01/30/13 1900  Weight: 64 kg (141 lb 1.5 oz)    Exam:   General:  Young male in no acute distress  HEENT: No pallor, moist oral mucosa  Chest: Clear to auscultation bilaterally, no added sounds  CVS: Normal S1-S2, no murmurs rub or gallop  Abdomen: Soft, nontender, nondistended, bowel sounds present  Extremities: Warm, no edema  CNS: AAO x3   Data Reviewed: Basic Metabolic Panel:  Recent Labs Lab 01/30/13 1702  NA 139  K 3.9  CL 101  CO2 24  GLUCOSE 88  BUN 13  CREATININE 0.78  CALCIUM 9.3   Liver Function Tests:  Recent Labs Lab 01/30/13 1702  AST 20  ALT 11  ALKPHOS 68  BILITOT 0.3  PROT 7.9  ALBUMIN 4.6   No results found for this basename: LIPASE, AMYLASE,  in the last 168 hours No results found for this basename: AMMONIA,  in the last 168 hours CBC:  Recent Labs Lab 01/30/13 1702  WBC 9.7  NEUTROABS 7.2  HGB 16.3  HCT 49.1  MCV 91.6  PLT 258   Cardiac Enzymes: No results found for this basename: CKTOTAL, CKMB, CKMBINDEX, TROPONINI,  in the last 168 hours BNP (last 3 results) No results found for this basename: PROBNP,  in the last 8760 hours CBG:  Recent Labs Lab 01/30/13 1532  GLUCAP 89    No results found for this or any previous visit (from the past 240 hour(s)).   Studies: Ct Head Wo Contrast  01/30/2013   CLINICAL DATA:  Altered mental status, slurred speech  EXAM: CT HEAD WITHOUT CONTRAST  TECHNIQUE: Contiguous axial images were obtained from the base of the skull through the vertex without intravenous contrast.  COMPARISON:  None.  FINDINGS: The ventricles are normal in size and position. There is no intracranial hemorrhage nor intracranial mass effect. There is no evidence of an evolving ischemic infarction. The cerebellum and brainstem are normal in density. There are no abnormal intracranial calcifications.  At bone window settings the  observed portions of the paranasal sinuses and mastoid air cells are clear. There is no evidence of an acute skull fracture.  IMPRESSION: There is no evidence of an acute intracranial abnormality.   Electronically Signed   By: David  [place]   On: 01/30/2013 16:23   Ct Lumbar Spine W Contrast  01/31/2013   CLINICAL DATA:  Severe scoliosis and prior lumbar fusion. Unsuccessful LP.  EXAM: CT LUMBAR SPINE WITH CONTRAST  TECHNIQUE: Multidetector CT imaging of the lumbar spine was performed with intravenous contrast administration. Multiplanar CT image reconstructions were also generated.  CONTRAST:  OMNIPAQUE IOHEXOL 300 MG/ML  SOLN  COMPARISON:  None.  FINDINGS: There is moderate lumbar dextroscoliosis with apex at approximately L2-3. 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 Butterfly type vertebra is noted at T11. Harrington rods extend from the thoracic spine into the sacrum with cerclage wires at multiple levels. There is complete, solid osseous fusion of the posterior elements throughout the visualized lower thoracic and lumbar spine. No acute fracture is identified. No listhesis is present. Evaluation of the spinal canal is limited due to streak artifact without gross stenosis identified. Visualized paraspinal soft tissues are unremarkable.  IMPRESSION: Thoracolumbar dextroscoliosis with multiple segmentation anomalies as above. There is solid osseous fusion of the posterior elements throughout the visualized lower thoracic and lumbar spine.   Electronically Signed   By: Sebastian Ache   On: 01/31/2013 08:52   Dg Chest Portable 1 View  01/30/2013   CLINICAL DATA:  Altered mental status.  EXAM: PORTABLE CHEST - 1 VIEW  COMPARISON:  None.  FINDINGS: There are extensive changes from spinal surgery with long stabilization rods spanning from the cervical thoracic junction tube  below the included field of view. There are rib deformities, which may be developmental, postsurgical or a combination.  Cardiac silhouette is normal in overall size and configuration. The mediastinum is normal in contour. The lungs are clear.  IMPRESSION: No acute cardiopulmonary disease.   Electronically Signed   By: Amie Portland M.D.   On: 01/30/2013 17:19   Dg Fluoro Guide Lumbar Puncture  01/30/2013   Chauncey Fischer, MD     01/30/2013  9:29 PM Attempted fluoroscopic lumbar puncture.  I prepped and drapped over the lumbar spine.  I anesthetized and  attempted to access the spinal canal at L4-5 and L2-3.  It was  very difficult to identify landmarks after fusion.  I was unable  to reach the spinal canal. It was unclear if the spine is fused  with bone graft over the lamina.  The procedure was discontinued to protect the patient.  I spoke with Dr. Mort Sawyers and recommended a CT lumbar spine  without contrast to evaluate the post fusion anatomy.  If there  is a window through the bone, LP in Interventional Radiology is  recommended to accommodate the angles needed to access the canal.     Scheduled Meds: . acyclovir  500 mg Intravenous Q8H  . cefTRIAXone (ROCEPHIN)  IV  2 g Intravenous Q12H  . vancomycin  750 mg Intravenous Q8H   Continuous Infusions: . dextrose 5 % and 0.45% NaCl 1,000 mL (01/30/13 2132)      Time spent: 25 minutes    Mahaila Tischer  [facility] Pager [phone] If 7PM-7AM, please contact night-coverage at [email or web address], password [facility] 01/31/2013, 12:54 PM  LOS: 1 day

## 2013-01-31 NOTE — Progress Notes (Signed)
Subjective: LP unable to be performed. He has markedly improved, mother states was improving   Exam: Filed Vitals:   01/31/13 0851  BP: 152/92  Pulse: 120  Temp: 97.7 F (36.5 C)  Resp: 20   Gen: In bed, NAD ZO:XWRUE, alert, answers questions. Appears to have some cognitive difficulty, but mother thinks that he is his normal self.  CN: PERRL, left eye externally deviated compared to right(baseline per mom).  Motor: right hemiatrophy, but able to give good strength to confrontation bilaterally.  Sensory:intact to lt  Impression: 22 yo M with AMS on arrival that has significantly improved. No fever(tmax 99.4) LP not possible. His improvement overnight is argument against meningitis or encephalitis, though without LP it is difficult to be sure. It was a very prolonged episode progressive over a day, so I think that seizure with post-ictal state is unlikely, though seizure could be a possibility. Psychiatric contribution is also difficult to rule out.   Recommendations: 1) EEG 2) MRI brain w/wo contrast 3) Further recommendations following these tests.   Ritta Slot, MD [facility] [phone]  If 7pm- 7am, please page neurology on call at [phone].

## 2013-01-31 NOTE — Progress Notes (Signed)
Routine EEG completed.  

## 2013-01-31 NOTE — Progress Notes (Signed)
During change of shift, writer went to pt room for end of shift assessment and noted pt needing help in walking to the BR. Pt noted to be independent during beginning of shift but walking one at a time with difficulty during this time. Pt was walking in robotic movement needing help with moving BLE. Pt A&O x4 and responsive at time. Pt denies any pain but upon finishing using the BR pt noted to be walking normally with no difficulty. This movement was witness along with NA at time. Incoming RN notified and aware of pt condition.

## 2013-01-31 NOTE — Procedures (Addendum)
History: 22 yo M presented with delirium  Sedation: none  Background: Relatively low voltage study. There is a well defined posterior dominant rhythm of 10 Hz that attenuates with eye opening. The background consists of predominantly alpha frequencies. There was some mild delta associated with drowsiness.   Photic stimulation: Physiologic driving is absen  EEG Abnormalities: None  Clinical Interpretation: This normal EEG is recorded in the waking and drowsy. There was no seizure or seizure predisposition recorded on this study.   Ritta Slot, MD [facility] [phone]  If 7pm- 7am, please page neurology on call at [phone].

## 2013-02-01 ENCOUNTER — Inpatient Hospital Stay (HOSPITAL_COMMUNITY): Payer: Medicaid Other

## 2013-02-01 DIAGNOSIS — I10 Essential (primary) hypertension: Secondary | ICD-10-CM | POA: Diagnosis present

## 2013-02-01 MED ORDER — LORAZEPAM 2 MG/ML IJ SOLN
1.0000 mg | Freq: Once | INTRAMUSCULAR | Status: AC
  Administered 2013-02-01: 1 mg via INTRAMUSCULAR
  Filled 2013-02-01: qty 1

## 2013-02-01 MED ORDER — INFLUENZA VAC SPLIT QUAD 0.5 ML IM SUSP
0.5000 mL | Freq: Once | INTRAMUSCULAR | Status: AC
  Administered 2013-02-01: 0.5 mL via INTRAMUSCULAR
  Filled 2013-02-01: qty 0.5

## 2013-02-01 MED ORDER — GADOBENATE DIMEGLUMINE 529 MG/ML IV SOLN
13.0000 mL | Freq: Once | INTRAVENOUS | Status: AC | PRN
  Administered 2013-02-01: 13 mL via INTRAVENOUS

## 2013-02-01 NOTE — Discharge Summary (Signed)
Physician Discharge Summary  [name] ZOX:[id number] DOB: 1990/10/04 DOA: 01/30/2013  PCP: Provider Not In System  Admit date: 01/30/2013 Discharge date: 02/01/2013  Time spent: 40 minutes  Recommendations for Outpatient Follow-up:  1. Home with outpt follow up with his neurologist in 1 week  Discharge Diagnoses:  Principal Problem:   Acute encephalopathy  Active Problems:   Tourettes syndrome   Accelerated hypertension   Discharge Condition: fair  Diet recommendation: Regular  Filed Weights   01/30/13 1900  Weight: 64 kg (141 lb 1.5 oz)    History of present illness:  22 year old male with history of Tourette's syndrome, right hemiatrophy, scoliosis, ADD, who was brought to the ED for change in mental status with decreased responsiveness and repetitive speech. Patient admitted for further workup  Head CT was unremarkable. Chest x-ray was negative. Patient had a low-grade temperature on presentation. An LP was attempted under fluoroscopy but was unsuccessful. Patient admitted for further workup.   Hospital Course:  Acute encephalopathy  Continue to monitor on telemetry. Seizure precautions. Neuro checks. Patient placed on empiric antibiotic with IV vancomycin, Rocephin and acyclovir for possible encephalitis.  CT of the lumbar spine done to evaluate for access for he has fluoroscopy guided therapy was unsuccessful initially. As per radiologist, patient has thoracolumbar dextroscoliosis with multiple segmentation anomalies and solid osseous fusion throughout the thoracic and lumbar spine was does not allow any space for LP.  -Discussed with neurology. MRI brain with contrast done to check for encephalopathy and was negative. EEG unremarkable. -Upon my discussion with the neurologist it appears that the patient received a dose of Ativan in the ED his symptoms had resolved and since he remains afebrile (MAXIMUM TEMPERATURE since presentation was only 99.30F), symptoms appear  unlikely to be due to meningitis and more likely behavioral due to stress with school and death in his family. Will not continue further antibiotics. -Patient does not have any worsened neurological ranges. Discussed with family and recommended following up with his neurologist in one week.  Tourette's syndrome  Patient on adderall as outpt. Follows with Dr Sharene Skeans.   HTN  Continue home dose clonidine. Blood pressure elevated today was I think is more likely due to anxiety. Followup BP as outpatient.  Code Status: Full code  Family Communication: father and cousin at  the bedside  Disposition Plan: Home with outpatient neurology followup  Consultants:  Neurology Procedures:  LP Antibiotics:  IV vancomycin, Rocephin and acyclovir   Discharge Exam: Filed Vitals:   02/01/13 1437  BP: 116/112  Pulse: 104  Temp: 98 F (36.7 C)  Resp: 20    General: Young male in no acute distress  HEENT: No pallor, moist oral mucosa  Chest: Clear to auscultation bilaterally, no added sounds  CVS: Normal S1-S2, no murmurs rub or gallop  Abdomen: Soft, nontender, nondistended, bowel sounds present  Extremities: Warm, no edema  CNS: Alert and awake, right hemiatrophy   Discharge Instructions     Medication List         amphetamine-dextroamphetamine 30 MG 24 hr capsule  Commonly known as:  ADDERALL XR  Take one by mouth every morning     cloNIDine 0.1 MG tablet  Commonly known as:  CATAPRES  Take 0.3 mg by mouth at bedtime.     Melatonin 5 MG Tabs  Take 5 mg by mouth at bedtime.     multivitamin with minerals Tabs tablet  Take 1 tablet by mouth daily.     VISINE MAXIMUM REDNESS RELIEF  0.05-0.2-0.36-1 % Soln  Generic drug:  Tetrahydroz-Glyc-Hyprom-PEG  Apply 2 drops to eye daily as needed (for dry eyes).       No Known Allergies     Follow-up Information   Follow up with Deetta Perla, MD. Schedule an appointment as soon as possible for a visit in 1 week.    Specialty:  Pediatrics   Contact information:   [address] [place] [address] [phone]        The results of significant diagnostics from this hospitalization (including imaging, microbiology, ancillary and laboratory) are listed below for reference.    Significant Diagnostic Studies: Ct Head Wo Contrast  01/30/2013   CLINICAL DATA:  Altered mental status, slurred speech  EXAM: CT HEAD WITHOUT CONTRAST  TECHNIQUE: Contiguous axial images were obtained from the base of the skull through the vertex without intravenous contrast.  COMPARISON:  None.  FINDINGS: The ventricles are normal in size and position. There is no intracranial hemorrhage nor intracranial mass effect. There is no evidence of an evolving ischemic infarction. The cerebellum and brainstem are normal in density. There are no abnormal intracranial calcifications.  At bone window settings the observed portions of the paranasal sinuses and mastoid air cells are clear. There is no evidence of an acute skull fracture.  IMPRESSION: There is no evidence of an acute intracranial abnormality.   Electronically Signed   By: David  [place]   On: 01/30/2013 16:23   Ct Lumbar Spine W Contrast  01/31/2013   CLINICAL DATA:  Severe scoliosis and prior lumbar fusion. Unsuccessful LP.  EXAM: CT LUMBAR SPINE WITH CONTRAST  TECHNIQUE: Multidetector CT imaging of the lumbar spine was performed with intravenous contrast administration. Multiplanar CT image reconstructions were also generated.  CONTRAST:  OMNIPAQUE IOHEXOL 300 MG/ML  SOLN  COMPARISON:  None.  FINDINGS: There is moderate lumbar dextroscoliosis with apex at approximately L2-3. 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 Butterfly type vertebra is noted at T11. Harrington rods extend from the thoracic spine into the sacrum with  cerclage wires at multiple levels. There is complete, solid osseous fusion of the posterior elements throughout the visualized lower thoracic and lumbar spine. No acute fracture is identified. No listhesis is present. Evaluation of the spinal canal is limited due to streak artifact without gross stenosis identified. Visualized paraspinal soft tissues are unremarkable.  IMPRESSION: Thoracolumbar dextroscoliosis with multiple segmentation anomalies as above. There is solid osseous fusion of the posterior elements throughout the visualized lower thoracic and lumbar spine.   Electronically Signed   By: Sebastian Ache   On: 01/31/2013 08:52   Mr Brain W Wo Contrast  02/01/2013   CLINICAL DATA:  Encephalitis.  Fever.  Mental status changes.  EXAM: MRI HEAD WITHOUT AND WITH CONTRAST  TECHNIQUE: Multiplanar, multiecho pulse sequences of the brain and surrounding structures were obtained without and with intravenous contrast.  CONTRAST:  13mL MULTIHANCE GADOBENATE DIMEGLUMINE 529 MG/ML IV SOLN  COMPARISON:  Head CT 12/26  FINDINGS: Study suffers from motion degradation. The brain has normal appearance without evidence of malformation, atrophy, old or acute infarction, mass lesion, hemorrhage, hydrocephalus or extra-axial collection. No abnormal contrast enhancement occurs. No pituitary mass. Sinuses, middle ears and mastoids are clear.  IMPRESSION: Normal examination.  Moderate motion degradation.   Electronically Signed   By: Paulina Fusi M.D.   On: 02/01/2013 13:58  Dg Chest Portable 1 View  01/30/2013   CLINICAL DATA:  Altered mental status.  EXAM: PORTABLE CHEST - 1 VIEW  COMPARISON:  None.  FINDINGS: There are extensive changes from spinal surgery with long stabilization rods spanning from the cervical thoracic junction tube below the included field of view. There are rib deformities, which may be developmental, postsurgical or a combination.  Cardiac silhouette is normal in overall size and configuration. The  mediastinum is normal in contour. The lungs are clear.  IMPRESSION: No acute cardiopulmonary disease.   Electronically Signed   By: Amie Portland M.D.   On: 01/30/2013 17:19   Dg Fluoro Guide Lumbar Puncture  01/30/2013   Chauncey Fischer, MD     01/30/2013  9:29 PM Attempted fluoroscopic lumbar puncture.  I prepped and drapped over the lumbar spine.  I anesthetized and  attempted to access the spinal canal at L4-5 and L2-3.  It was  very difficult to identify landmarks after fusion.  I was unable  to reach the spinal canal. It was unclear if the spine is fused  with bone graft over the lamina.  The procedure was discontinued to protect the patient.  I spoke with Dr. Mort Sawyers and recommended a CT lumbar spine  without contrast to evaluate the post fusion anatomy.  If there  is a window through the bone, LP in Interventional Radiology is  recommended to accommodate the angles needed to access the canal.     Microbiology: Recent Results (from the past 240 hour(s))  CULTURE, BLOOD (ROUTINE X 2)     Status: None   Collection Time    01/30/13 12:15 AM      Result Value Range Status   Specimen Description BLOOD RIGHT ARM   Final   Special Requests BOTTLES DRAWN AEROBIC ONLY 2CC   Final   Culture  Setup Time     Final   Value: 01/31/2013 12:50     Performed at [facility]   Culture     Final   Value:        BLOOD CULTURE RECEIVED NO GROWTH TO DATE CULTURE WILL BE HELD FOR 5 DAYS BEFORE ISSUING A FINAL NEGATIVE REPORT     Performed at [facility]   Report Status PENDING   Incomplete  CULTURE, BLOOD (ROUTINE X 2)     Status: None   Collection Time    01/30/13 12:24 AM      Result Value Range Status   Specimen Description BLOOD RIGHT HAND   Final   Special Requests BOTTLES DRAWN AEROBIC ONLY 2CC   Final   Culture  Setup Time     Final   Value: 01/31/2013 12:50     Performed at [facility]   Culture     Final   Value:        BLOOD CULTURE RECEIVED NO GROWTH TO  DATE CULTURE WILL BE HELD FOR 5 DAYS BEFORE ISSUING A FINAL NEGATIVE REPORT     Performed at [facility]   Report Status PENDING   Incomplete     Labs: Basic Metabolic Panel:  Recent Labs Lab 01/30/13 1702 01/31/13 1850  NA 139 137  K 3.9 3.5  CL 101 98  CO2 24 27  GLUCOSE 88 120*  BUN 13 9  CREATININE 0.78 0.73  CALCIUM 9.3 8.8   Liver Function Tests:  Recent Labs Lab 01/30/13 1702  AST 20  ALT 11  ALKPHOS 68  BILITOT  0.3  PROT 7.9  ALBUMIN 4.6   No results found for this basename: LIPASE, AMYLASE,  in the last 168 hours No results found for this basename: AMMONIA,  in the last 168 hours CBC:  Recent Labs Lab 01/30/13 1702 01/31/13 1850  WBC 9.7 7.4  NEUTROABS 7.2  --   HGB 16.3 15.7  HCT 49.1 47.1  MCV 91.6 89.7  PLT 258 260   Cardiac Enzymes: No results found for this basename: CKTOTAL, CKMB, CKMBINDEX, TROPONINI,  in the last 168 hours BNP: BNP (last 3 results) No results found for this basename: PROBNP,  in the last 8760 hours CBG:  Recent Labs Lab 01/30/13 1532  GLUCAP 89       Signed:  Rosi Secrist  [facility] 02/01/2013, 3:05 PM

## 2013-02-01 NOTE — Progress Notes (Signed)
Pt had one episode of urinary incontinence during the night, pt's mother insisted pt clean himself up without assistance. Wash clothes and towels provided and pt was able to wash himself with minimal assistance. Pt did appear somewhat unsteady standing up; this writer reminded pt and mom to call for assistance when getting out of bed to prevent fall. Pt's mother voiced understanding.

## 2013-02-01 NOTE — Progress Notes (Signed)
Subjective: Pt back to baseline per mother and father.  Per family, he actually improved shortly after ativan administration, BEFORE, antibiotics were given.   Exam: Filed Vitals:   02/01/13 0600  BP: 124/82  Pulse: 110  Temp: 98.5 F (36.9 C)  Resp: 20   Gen: In bed, NAD YN:WGNFA, alert, answers questions. CN: PERRL, left eye externally deviated compared to right(baseline per mom).  Motor: right hemiatrophy, but able to give good strength to confrontation bilaterally.  Sensory:intact to lt  Impression: 22 yo M with AMS on arrival that has significantly improved. No fever(tmax 99.4) LP not possible. His improvement so quickly is argument against meningitis or encephalitis. His improvement prior to antibiotics I think essentially rules this out.  Though if he were to develop fevers, worsening symptoms as outpatient family knows to bring him back.   Frontal status epilepticus can cause prolonged AMS that resolves with ativan administration, but spontaneous onset usually occurs in the elderly, and he has no other reasons to suspect this. This is his first episode like this.   Finally, I think that the most likely diagnosis is behavioral related to the stress at the end of the semester and recent death in the family.   Recommendations: 1) No further need for antibiotics.  2) Pt can follow up with Dr. Sharene Skeans as an outpatient.    Ritta Slot, MD [facility] [phone]  If 7pm- 7am, please page neurology on call at [phone].

## 2013-02-06 LAB — CULTURE, BLOOD (ROUTINE X 2)
Culture: NO GROWTH
Culture: NO GROWTH

## 2013-02-09 ENCOUNTER — Telehealth: Payer: Self-pay

## 2013-02-09 DIAGNOSIS — F952 Tourette's disorder: Secondary | ICD-10-CM

## 2013-02-09 DIAGNOSIS — G2569 Other tics of organic origin: Secondary | ICD-10-CM

## 2013-02-09 MED ORDER — CLONIDINE HCL 0.1 MG PO TABS: 0.3000 mg | ORAL_TABLET | Freq: Every day | ORAL | Status: DC

## 2013-02-09 NOTE — Telephone Encounter (Signed)
[name], lvm stating that pt needs Cl[facility]. I called mom and told her to check with the pharmacy later today.

## 2013-02-09 NOTE — Telephone Encounter (Signed)
Rx sent electronically. TG 

## 2013-03-03 ENCOUNTER — Encounter: Payer: Self-pay | Admitting: Pediatrics

## 2013-03-03 ENCOUNTER — Ambulatory Visit (INDEPENDENT_AMBULATORY_CARE_PROVIDER_SITE_OTHER): Payer: Medicaid Other | Admitting: Pediatrics

## 2013-03-03 VITALS — BP 110/70 | HR 84 | Ht <= 58 in | Wt 124.8 lb

## 2013-03-03 DIAGNOSIS — H93299 Other abnormal auditory perceptions, unspecified ear: Secondary | ICD-10-CM

## 2013-03-03 DIAGNOSIS — F988 Other specified behavioral and emotional disorders with onset usually occurring in childhood and adolescence: Secondary | ICD-10-CM

## 2013-03-03 DIAGNOSIS — G4700 Insomnia, unspecified: Secondary | ICD-10-CM

## 2013-03-03 DIAGNOSIS — Q675 Congenital deformity of spine: Secondary | ICD-10-CM

## 2013-03-03 DIAGNOSIS — G2569 Other tics of organic origin: Secondary | ICD-10-CM

## 2013-03-03 DIAGNOSIS — F70 Mild intellectual disabilities: Secondary | ICD-10-CM

## 2013-03-03 DIAGNOSIS — F952 Tourette's disorder: Secondary | ICD-10-CM

## 2013-03-03 MED ORDER — AMPHETAMINE-DEXTROAMPHETAMINE 10 MG PO TABS: ORAL_TABLET | ORAL | Status: DC

## 2013-03-03 MED ORDER — CLONIDINE HCL 0.1 MG PO TABS: 0.3000 mg | ORAL_TABLET | Freq: Every day | ORAL | Status: DC

## 2013-03-03 NOTE — Progress Notes (Signed)
Patient: [name] MRN: [id number] Sex: male DOB: August 08, 1990  Provider: Deetta Perla, MD Location of Care: [facility]  Note type: Routine return visit  History of Present Illness: Referral Source: Dr. Lerry Liner History from: caregiver, patient, hospital chart and [facility] chart Chief Complaint: Hospital Follow Up/Altered Mental Status   [name] is a 23 y.o. 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  The patient was seen on March 03, 2013.  He was last evaluated on October 07, 2012.  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  He was hospitalized on January 30, 2013, through February 01, 2013, at [facility] because of an acute encephalopathy with altered mental status.  He presented in the afternoon on January 30, 2013, with a 2-day history of altered mental status.  He showed increasing confusion, difficulty speaking, and episodes of stiffening of his legs without being able to walk.  He talked about being afraid of water and dogs and felt as if he was "traveling".  In the emergency department he appeared to be alert and oriented to person, but he showed weakness that was worse in his lower extremities despite the fact that he could move them.  He was nonverbal and following commands.  He showed no constitutional signs of infection including fever.  He had a workup that included comprehensive metabolic panel that was normal including liver functions.  White blood cell count was not elevated, CBC was otherwise normal.  CT scan of the brain failed to show evidence of an acute intracranial abnormality with normal ventricles.  Attempts to perform a lumbar  puncture were unsuccessful because of previous spinal fusion.  He had thoracolumbar dextroscoliosis with multiple segmentation abnormalities.  He had a solid osseous fusion.  During this time, the patient's mental status improved and a decision was made to not persist and perform a lumbar puncture in his cervical region.  He initially was treated with broad-spectrum acyclovir, Rocephin, and vancomycin.  These were discontinued and he was observed for a day without change.  EEG was performed and was interpreted as normal.  MRI of the brain was normal, although was impaired by movement artifact.  This was performed without and with contrast.  The meninges did not enhance.  He returns today for evaluation.  His mother believes that the delirium was caused because of change from generic Adderall to trade drug Adderall at the same dose.  I think that this is highly unlikely, but she seems determined that the patient should be returned to generic Adderall.  She discontinued the medication and with it, he has experienced increasing problems with attention span at [name].  He attends [employer]  He works out twice a week in [name] on a treadmill and [name].  His weight has dropped over 16 pounds since I saw him last, this is the healthiest that I have seen him.  He tells me that he is eating more carefully and is working out.  I have no explanation for the behavior.  It is known that amphetamines can cause delirium and psychosis, but he has been taking neuro-stimulant medications for years without problem and I doubt very much that the difference between generic and trade drug have anything to do with his delirium.  His motor tics are minimal.  Mother had no other concerns today.  Review of Systems: 12 system review was remarkable for hearing loss, allergies, depression, anxiety, disinterest in past activities and racing thoughts  Past Medical History  Diagnosis Date  . Tourette's  syndrome   . Tic disorder   . Scoliosis   . Hemiatrophy of right leg   . Club foot     Right   Hospitalizations: yes, Head Injury: no, Nervous System Infections: no, Immunizations up to date: yes Past Medical History Comments: Patient was hospitalized in December 2014 due to altered mental status.  Behavior History none  Surgical History Past Surgical History  Procedure Laterality Date  . Foot surgery Right 2002  . Eye muscle surgery Bilateral 1998 and 2002  . Femoral hernia repair      Done when he was an infant  . Harrington rod surgery  2003    For Scoliosis    Family History family history includes Breast cancer in his maternal grandmother; Hypertension in an other family member; Prostate cancer in his maternal grandfather. Family History is negative migraines, seizures, cognitive impairment, blindness, deafness, birth defects, chromosomal disorder, autism.  Social History History   Social History  . Marital Status: Single    Spouse Name: N/A    Number of Children: N/A  . Years of Education: N/A   Social History Main Topics  . Smoking status: Never Smoker   . Smokeless tobacco: Never Used  . Alcohol Use: No  . Drug Use: No  . Sexual Activity: No   Other Topics Concern  . None   Social History Narrative  . None   Educational level [name] Attending: [name] Occupation: Student  Living with [name] his AFL Home provider   Hobbies/Interest: Enjoys going bowling, eating, being on his computer listening to music and playing video games. [name] comments [name] is attending [name], he's an Human resources officerexcellent student and doing very well academically. 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 He graduated from [name] in 2011.  Current Outpatient Prescriptions on File Prior to Visit  Medication Sig Dispense Refill  . cloNIDine (CATAPRES) 0.1 MG tablet Take 3 tablets (0.3 mg total) by  mouth at bedtime.  60 tablet  5  . Melatonin 5 MG TABS Take 5 mg by mouth at bedtime.      . Multiple Vitamin (MULTIVITAMIN WITH MINERALS) TABS tablet Take 1 tablet by mouth daily.       No current facility-administered medications on file prior to visit.   The medication list was reviewed and reconciled. All changes or newly prescribed medications were explained.  A complete medication list was provided to the patient/caregiver.  No Known Allergies  Physical Exam BP 110/70  Pulse 84  Ht 4' 7.75" (1.416 m)  Wt 124 lb 12.8 oz (56.609 kg)  BMI 28.23 kg/m2  General: alert, well developed, well nourished short statured young man, in no acute distress, right-handed  Head: normocephalic, no dysmorphic features  Ears, Nose and Throat: Otoscopic: tympanic membranes normal . Pharynx: oropharynx is pink without exudates or tonsillar hypertrophy,Short with a Klipel- Feil abnormality Neck: short neck, diminished range of motion, no cranial or cervical bruits  Respiratory: auscultation clear  Cardiovascular: no murmurs, pulses are normal  Musculoskeletal: right hemiatrophy most evident in the distal leg which is wasted. He wears shoes buildup. He is repaired dextro thoracolumbar scoliosis  Skin: no rashes or neurocutaneous lesions  Trunk: he has a healed gastrostomy scar in his abdomen. .  Neurologic Exam   Mental Status: alert; Oriented to place and name.He is talkative and attentive during the examination. His speech was rapid but he has no dysphasia.  He has mild dysarthria.  Cranial Nerves: visual fields are full to double simultaneous stimuli; extraocular movements are full and conjugate; pupils are round reactive to light; funduscopic examination shows sharp disc margins with normal vessels; symmetric facial strength; midline tongue and uvula; hearing is normal and symmetric  Motor: He has mild weakness in the right leg distally but fairly good proximal strength. He has normal strength In  both arms and left leg. His fine motor skills are equal.  Sensory: intact responses to cold, vibration, proprioception and stereognosis  Coordination: good finger-to-nose, rapid repetitive alternating movements and finger apposition  Gait and Station: The patient has a right hemiparetic gait with the right leg externally rotated and the right arm held away  Reflexes: Diminished to absent bilaterally; no clonus; bilateral flexor plantar responses   Assessment 1. Attention deficit disorder without mention of hyperactivity (314.00). 2. 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 3. 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 4. Insomnia (780.52). 5. 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 6. 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  Plan Continue clonidine at its current dose and switch to generic Adderall starting at 10 mg a day, we will gradually increase this over the next three weeks to 30 mg a day, if he seems to need the medication and tolerates it.  Once we can determine the appropriate dose, I will shift him from 10 mg tablets to that dose.  I spent 30 minutes of face-to-face time with Mehtab and his mother, more than half of it in consultation.  Deetta Perla MD

## 2013-03-10 ENCOUNTER — Telehealth: Payer: Self-pay | Admitting: Family

## 2013-03-10 NOTE — Telephone Encounter (Addendum)
Mom [name] called. She said that he has been on 10 mg since Thurs and he is slowing down again. He is acting like he is out of it. Mom asks is 10mg  is too much - maybe he doesn't need it any more? Her number is [phone].  I called and talked to mom. She said that after his recent episode of delirium, Dr Sharene SkeansHickling restarted [name] on generic Adderall (IR) with a plan to titrate his dose. Mom said that [name] was doing well off the medication but since restarting it, he has been acting like he did before he was hospitalized. He needs redirection, is slow to speak, is not as alert and engaged. She wonders if they can try for him to not take any of the Adderall for awhile. His workers at school are willing to work with him off medication. I told her that I would talk with Dr Sharene SkeansHickling and one of us would call her back. Mom's number is [phone]. TG

## 2013-03-10 NOTE — Telephone Encounter (Signed)
We will discontinue the Adderall.  I understand why he's having trouble with it.  If he does not improve, then we will reassess him.  He improved last time once he came off the medicine.  What is hard to understand his that he tolerated this medicine previously.Mother will call me back if he does not improve.

## 2013-03-16 ENCOUNTER — Encounter: Payer: Self-pay | Admitting: Family

## 2013-03-16 ENCOUNTER — Telehealth: Payer: Self-pay

## 2013-03-16 NOTE — Telephone Encounter (Signed)
I called and talked to Mom. She said that [name] is just about back to his usual self, not needing help with all his daily activities, more alert etc. Mom is not giving him any medications and is pleased with how he is doing. She feels that he is ready [facility] [employer] [facility] [employer] [facility] and needs a note from Dr Sharene SkeansHickling stating that is ok for him to return. Mom will pick up note tomorrow. Note is written and ready for signature. TG

## 2013-03-16 NOTE — Telephone Encounter (Signed)
Signed and returned to [place] to place in the mail.

## 2013-03-16 NOTE — Telephone Encounter (Signed)
[name], mom, lvm stating that pt's Adderall was dc's 5 days ago. He is with mom today and is doing much better. He was able to perform all of ADL's w/o difficulty. Mom said that pt would like to go back to school on Wednesday. [employer] is asking for release from Dr.H before he can go back. Mom would like to speak w Inetta Fermoina so that she may go into greater detail. Please call Marylene Landngela at [phone].

## 2013-04-21 ENCOUNTER — Telehealth: Payer: Self-pay | Admitting: *Deleted

## 2013-04-21 MED ORDER — FOCALIN XR 5 MG PO CP24: ORAL_CAPSULE | ORAL | Status: DC

## 2013-04-21 NOTE — Telephone Encounter (Signed)
I attempted to call Mom but the voicemail was full and would not accept messages. I will try again later. TG

## 2013-04-21 NOTE — Telephone Encounter (Signed)
Marylene Landngela the patient's mom called and stated that the patient is having problems focusing in class and he needs something mild to help with this issue. Mom says they do not wont to go back on Adderall however Lashon needs something that will help him. Mom can be reached at [phone]. She says that he is doing much better than he was he's just having this small issue at this time.      Thanks,  Belenda CruiseMichelle B.

## 2013-04-21 NOTE — Telephone Encounter (Signed)
What about Focalin XR 5 or 10 mg?

## 2013-04-21 NOTE — Telephone Encounter (Signed)
I called Mom back to get more information. She said that [name] was in a [facility] [employer] [facility] [employer] [employer] and that he had supported work 3 days per week and classes 1 day per week. He is having trouble both on work days and on the class days with focus and attention. His day goes from 8-4. Mom wonders if there is a different medication that he could try. She is very fearful of him going back on Adderall after his recent experience this winter with altered mental status. I told her that I would talk with Dr Sharene SkeansHickling and one of us would call her back. TG

## 2013-04-21 NOTE — Telephone Encounter (Signed)
I called Mom and told her that we would try Focalin XR 5mg . I explained that we were starting at low dose and asked her to let us know how he tolerates it and how it works for attention. She agreed with this plan and will pick up the Rx tomorrow. TG

## 2013-04-21 NOTE — Telephone Encounter (Signed)
Mom called and lvm, said that she was wondering what the Dr.H and Inetta Fermoina have decided. She said that she can be reached at [phone].

## 2013-05-01 ENCOUNTER — Telehealth: Payer: Self-pay

## 2013-05-01 NOTE — Telephone Encounter (Signed)
[name], mom, lvm stating that pt has been taking the Focalin XR 5 mg and is still having a difficult time focusing. She wants to know if he can increase to 10 mg? [name] can be reached at [phone].

## 2013-05-01 NOTE — Telephone Encounter (Signed)
I left a message for Mom, and told her that I will call her back later today. TG

## 2013-05-01 NOTE — Telephone Encounter (Signed)
I called Mom back. She said that [name] had not had any side effects on the Focalin XR 5mg  but that it had also not helped any with focus an attention. She knows that we had intentionally started on low dose to see how he tolerated it. I told her to increase to 2 capsules since he has medication remaining and to call me next week to tell me how he did on the 10mg  dose. She agreed with this plan. TG

## 2013-05-01 NOTE — Telephone Encounter (Signed)
I reviewed the notes and agree with this plan.

## 2013-05-01 NOTE — Telephone Encounter (Signed)
Mom called and lvm asking that you call her back at [phone]. Apologized for missing call. I called mom to let her know that Inetta Fermoina was with pts and would return call later today. She will be sure to have phone with her to receive call.

## 2013-05-06 ENCOUNTER — Telehealth: Payer: Self-pay

## 2013-05-06 MED ORDER — DEXMETHYLPHENIDATE HCL ER 5 MG PO CP24: ORAL_CAPSULE | ORAL | Status: DC

## 2013-05-06 NOTE — Telephone Encounter (Signed)
[name], mom, lvm stating that pt started taking Focalin XR 5 mg 2 po qd. This increase was started on 05/02/13. Mom said that he may be increasing to 3 po on 05/09/13. She is asking for a Rx to accommodate the increase bc they are almost out. Mom said that call when the Rx is ready and that she will pick it up. Her number is [phone].

## 2013-05-06 NOTE — Telephone Encounter (Signed)
I called and talked to Mom. She said that [name] had tolerated the increase to Focalin XR 10mg  without side effects but that it had not helped his focus and attention. I told her that I would talk with Dr Sharene SkeansHickling and call her back. I consulted with Dr Sharene SkeansHickling and we will increase his dose to Focalin XR 15mg , using 5mg  capules so that we can increase it again if needed. I will put a new Rx at front desk for her to pick up. I called Mom and let her know. TG

## 2013-05-26 ENCOUNTER — Telehealth: Payer: Self-pay

## 2013-05-26 MED ORDER — DEXMETHYLPHENIDATE HCL ER 25 MG PO CP24: ORAL_CAPSULE | ORAL | Status: DC

## 2013-05-26 NOTE — Telephone Encounter (Signed)
I called and talked to Mom. She said that [name] has been taking generic Focalin XR 5mg  - 4 capsules every morning. She said that it has helped but that he is still restless, not focused on his work enough. He has had no side effects. She wants to try to increase again. I agreed but told her that if this is not sufficient that he will need to come in for an appointment. Mom agreed to this plan. TG

## 2013-05-26 NOTE — Telephone Encounter (Signed)
[name], mom, lvm stating that she would like to increase pt's Focalin XR from 20 mg to 25 mg. She said that he will also need a refill and to call her when ready for pick up [phone].

## 2013-06-03 ENCOUNTER — Telehealth: Payer: Self-pay | Admitting: *Deleted

## 2013-06-03 ENCOUNTER — Encounter (HOSPITAL_COMMUNITY): Payer: Self-pay | Admitting: Emergency Medicine

## 2013-06-03 ENCOUNTER — Emergency Department (HOSPITAL_COMMUNITY)
Admission: EM | Admit: 2013-06-03 | Discharge: 2013-06-03 | Disposition: A | Discharge status: Home / Self Care | Payer: Medicaid Other | Attending: Emergency Medicine | Admitting: Emergency Medicine

## 2013-06-03 DIAGNOSIS — Z79899 Other long term (current) drug therapy: Secondary | ICD-10-CM | POA: Insufficient documentation

## 2013-06-03 DIAGNOSIS — Y9389 Activity, other specified: Secondary | ICD-10-CM | POA: Insufficient documentation

## 2013-06-03 DIAGNOSIS — T43634A Poisoning by methylphenidate, undetermined, initial encounter: Secondary | ICD-10-CM | POA: Insufficient documentation

## 2013-06-03 DIAGNOSIS — F952 Tourette's disorder: Secondary | ICD-10-CM | POA: Insufficient documentation

## 2013-06-03 DIAGNOSIS — Y929 Unspecified place or not applicable: Secondary | ICD-10-CM | POA: Insufficient documentation

## 2013-06-03 DIAGNOSIS — T50901A Poisoning by unspecified drugs, medicaments and biological substances, accidental (unintentional), initial encounter: Secondary | ICD-10-CM

## 2013-06-03 DIAGNOSIS — R000 Tachycardia, unspecified: Secondary | ICD-10-CM | POA: Insufficient documentation

## 2013-06-03 DIAGNOSIS — Z8739 Personal history of other diseases of the musculoskeletal system and connective tissue: Secondary | ICD-10-CM | POA: Insufficient documentation

## 2013-06-03 DIAGNOSIS — Z8776 Personal history of (corrected) congenital malformations of integument, limbs and musculoskeletal system: Secondary | ICD-10-CM | POA: Insufficient documentation

## 2013-06-03 DIAGNOSIS — T43601A Poisoning by unspecified psychostimulants, accidental (unintentional), initial encounter: Secondary | ICD-10-CM | POA: Insufficient documentation

## 2013-06-03 DIAGNOSIS — M6281 Muscle weakness (generalized): Secondary | ICD-10-CM | POA: Insufficient documentation

## 2013-06-03 DIAGNOSIS — Z87768 Personal history of other specified (corrected) congenital malformations of integument, limbs and musculoskeletal system: Secondary | ICD-10-CM | POA: Insufficient documentation

## 2013-06-03 NOTE — Telephone Encounter (Signed)
At 10:23 am, Marylene Landngela called because [name] took 5 pills of the Focalin this morning. [facility] was [facility]. They flushed his system out. He is doing well, he is drinking fluids and resting. She is with him at the hospital now, Rm 10A. You can reach her at [phone]

## 2013-06-03 NOTE — Discharge Instructions (Signed)

## 2013-06-03 NOTE — Telephone Encounter (Signed)
I have attempted to call Mom again - continue to receive messages saying that her voicemail is full. I will try again later. TG

## 2013-06-03 NOTE — ED Provider Notes (Signed)
CSN: [id number]     Arrival date & time 06/03/13  0813 History   First MD Initiated Contact with Patient 06/03/13 [phone]     Chief Complaint  Patient presents with  . Ingestion     (Consider location/radiation/quality/duration/timing/severity/associated sxs/prior Treatment) Patient is a 23 y.o. male presenting with Ingested Medication. The history is provided by the patient.  Ingestion This is a new problem. Pertinent negatives include no chest pain, no abdominal pain, no headaches and no shortness of breath.   patient is on Focalyn XR, he was on 5 25 mg tablets a day. His pills were changed to 25 mg tabs yesterday and today he still took his 5 pills, not knowing that it was changed and took 125 mg just prior to arrival instead of his 25 mg. He feels heart going a little fast but it was feels good. No nausea vomiting. No chest pain. No other ingestion.  Past Medical History  Diagnosis Date  . Tourette's syndrome   . Tic disorder   . Scoliosis   . Hemiatrophy of right leg   . Club foot     Right   Past Surgical History  Procedure Laterality Date  . Foot surgery Right 2002  . Eye muscle surgery Bilateral 1998 and 2002  . Femoral hernia repair      Done when he was an infant  . Harrington rod surgery  2003    For Scoliosis   Family History  Problem Relation Age of Onset  . Breast cancer Maternal Grandmother     Died in her mid 1670's  . Prostate cancer Maternal Grandfather     Died in his 3980's  . Hypertension     History  Substance Use Topics  . Smoking status: Never Smoker   . Smokeless tobacco: Never Used  . Alcohol Use: No    Review of Systems  Constitutional: Negative for activity change and appetite change.  Eyes: Negative for pain.  Respiratory: Negative for chest tightness and shortness of breath.   Cardiovascular: Negative for chest pain and leg swelling.  Gastrointestinal: Negative for nausea, vomiting, abdominal pain and diarrhea.  Genitourinary: Negative for  flank pain.  Musculoskeletal: Negative for back pain and neck stiffness.  Skin: Negative for rash.  Neurological: Negative for weakness, numbness and headaches.  Psychiatric/Behavioral: Negative for behavioral problems.      Allergies  Review of patient's allergies indicates no known allergies.  Home Medications   Prior to Admission medications   Medication Sig Start Date End Date Taking? Authorizing Provider  cloNIDine (CATAPRES) 0.1 MG tablet Take 3 tablets (0.3 mg total) by mouth at bedtime. 03/03/13  Yes Deetta PerlaWilliam H Hickling, MD  Dexmethylphenidate HCl (FOCALIN XR) 25 MG CP24 Take 1 capsule every morning 05/26/13  Yes Elveria Risingina Goodpasture, NP  Melatonin 5 MG TABS Take 5 mg by mouth at bedtime.   Yes Historical Provider, MD   BP 136/85  Pulse 114  Temp(Src) 98.8 F (37.1 C) (Oral)  Resp 24  SpO2 96% Physical Exam  Constitutional: He is oriented to person, place, and time. He appears well-developed and well-nourished.  Eyes: Pupils are equal, round, and reactive to light.  Neck: Neck supple.  Cardiovascular:  Tachycardia  Pulmonary/Chest: Effort normal and breath sounds normal.  Musculoskeletal:  Previous history scoliosis. Chronically weak on the left side  Neurological: He is alert and oriented to person, place, and time.  Skin: Skin is warm.    ED Course  Procedures (including critical care time) Labs  Review Labs Reviewed - No data to display  Imaging Review No results found.   EKG Interpretation   Date/Time:  Wednesday June 03 2013 08:47:38 EDT Ventricular Rate:  121 PR Interval:  146 QRS Duration: 93 QT Interval:  323 QTC Calculation: 458 R Axis:   78 Text Interpretation:  Sinus tachycardia LVH by voltage Nonspecific T  abnormalities, inferior leads Confirmed by Rubin PayorPICKERING  MD, Yeraldy Spike [phone]([id number])  on 06/03/2013 9:34:22 AM      MDM   Final diagnoses:  Accidental overdose    Patient with accidental overdose on his stimulant medication. Discussed with  poison control. Recommended 3 hours of watching. Patient's tachycardia has improved somewhat. He will likely get another peak later. He'll be discharged home he'll followup with his neurologist. He'll skip his medication tomorrow.    Juliet RudeNathan R. Rubin PayorPickering, MD 06/03/13 [phone]

## 2013-06-03 NOTE — ED Notes (Signed)
Pt drinking ginger ale at the time. No signs of distress noted. Placed on cardiac monitor. Family at bedside.

## 2013-06-03 NOTE — ED Notes (Signed)
Dr. Rubin PayorPickering aware of pt HR. Ok to discharge.

## 2013-06-03 NOTE — ED Notes (Signed)
Pt was brought back to room with family in tow; pt instructed to undress and place on gown; NT aware

## 2013-06-03 NOTE — ED Notes (Signed)
Pt states he accidentally took 125mg  of Focalin this morning. Now states he feels anxious and nervous. Denies pain. He is alert and oriented x4. Respirations unlabored. Family is concerned that he could of overdosed.

## 2013-06-03 NOTE — ED Notes (Signed)
Pt presents to department for evaluation of accidental overdose. Pt states he took (5) capsules of Focalin (125mg  total). States he was unaware that dosage was changed. Now states he feels anxious/nervous. Denies pain. Respirations unlabored. Pt is alert and oriented x4. NAD.

## 2013-06-03 NOTE — Telephone Encounter (Signed)
I tried again to call Mom but again reached a message saying that her voicemail was full. I will try again tomorrow. TG

## 2013-06-03 NOTE — Telephone Encounter (Signed)
I attempted to call Mom - received message saying mailbox was full. I will try again later. TG

## 2013-06-04 NOTE — Telephone Encounter (Signed)
I left a message for Mom and asked her to call back. TG 

## 2013-06-05 ENCOUNTER — Encounter: Payer: Self-pay | Admitting: Family

## 2013-06-05 NOTE — Telephone Encounter (Addendum)
I attempted to call Jerrard's mother again this morning and received a message saying that the mailbox was full. I will mail a letter asking her to call me. TG

## 2013-06-26 ENCOUNTER — Telehealth: Payer: Self-pay | Admitting: *Deleted

## 2013-06-26 MED ORDER — DEXMETHYLPHENIDATE HCL ER 25 MG PO CP24: ORAL_CAPSULE | ORAL | Status: DC

## 2013-06-26 NOTE — Telephone Encounter (Signed)
I called mom and she would like to pick the Rx up when ready. I told her that Inetta Fermo would like to speak to her and to be expecting a call from her. She expressed understanding.

## 2013-06-26 NOTE — Telephone Encounter (Signed)
I called and talked with Mom. She said that [name] was doing fine and that he was doing well on this dose. She will pick up this Rx later today. TG

## 2013-06-26 NOTE — Telephone Encounter (Signed)
The mother stated she received your letter and the pt is doing great on the Focalin 25 mg. She is happy about the medication, he is  and does not need to go up on it. She needs a refill on the Focalin. The mother can be reached at [phone].

## 2013-07-31 ENCOUNTER — Telehealth: Payer: Self-pay

## 2013-07-31 DIAGNOSIS — F909 Attention-deficit hyperactivity disorder, unspecified type: Secondary | ICD-10-CM

## 2013-07-31 MED ORDER — DEXMETHYLPHENIDATE HCL ER 25 MG PO CP24: ORAL_CAPSULE | ORAL | Status: DC

## 2013-07-31 NOTE — Telephone Encounter (Signed)
I called mom and let her know the Rx was at the front desk for p/u.

## 2013-07-31 NOTE — Telephone Encounter (Signed)
[name], mom, lvm stating that pt needs Rx for his Focalin XR. She stated that she can pick it up on Monday. I will call mom to let her know when it is ready [phone].

## 2013-09-04 ENCOUNTER — Other Ambulatory Visit: Payer: Self-pay | Admitting: Family

## 2013-09-04 DIAGNOSIS — F909 Attention-deficit hyperactivity disorder, unspecified type: Secondary | ICD-10-CM

## 2013-09-04 MED ORDER — DEXMETHYLPHENIDATE HCL ER 25 MG PO CP24: ORAL_CAPSULE | ORAL | Status: DC

## 2013-09-04 NOTE — Telephone Encounter (Signed)
Wants to pick up Rx for generic Focalin this AM. I called Mom to pick up Rx. TG

## 2013-09-25 ENCOUNTER — Other Ambulatory Visit: Payer: Self-pay | Admitting: Family

## 2013-09-25 DIAGNOSIS — F909 Attention-deficit hyperactivity disorder, unspecified type: Secondary | ICD-10-CM

## 2013-09-25 MED ORDER — DEXMETHYLPHENIDATE HCL ER 25 MG PO CP24: ORAL_CAPSULE | ORAL | Status: DC

## 2013-09-25 NOTE — Telephone Encounter (Signed)
[name] left a message that he needed to pick up a refill on generic Focalin. I called him back and let him know that he could pick Rx up. TG

## 2013-09-25 NOTE — Telephone Encounter (Signed)
Previous Rx did not print. TG 

## 2013-09-25 NOTE — Addendum Note (Signed)
Addended by: Princella IonGOODPASTURE, Martino Tompson P on: 09/25/2013 06:24 PM   Modules accepted: Orders

## 2013-09-29 ENCOUNTER — Other Ambulatory Visit: Payer: Self-pay | Admitting: Family

## 2013-09-29 DIAGNOSIS — G2569 Other tics of organic origin: Secondary | ICD-10-CM

## 2013-09-29 MED ORDER — CLONIDINE HCL 0.1 MG PO TABS: ORAL_TABLET | ORAL | Status: DC

## 2013-10-01 ENCOUNTER — Telehealth: Payer: Self-pay | Admitting: Family

## 2013-10-01 ENCOUNTER — Encounter: Payer: Self-pay | Admitting: Family

## 2013-10-01 MED ORDER — DEXMETHYLPHENIDATE HCL ER 5 MG PO CP24: ORAL_CAPSULE | ORAL | Status: DC

## 2013-10-01 NOTE — Telephone Encounter (Signed)
Mom Malka So left message saying that Jernard was having behavioral problems and wanted to know if Focalin XR dose could increase. I called her back and she said that he had been doing very well on Focalin for a while, but recently Floyde has been less focused, not able to listen and pay attention to instructions, has had outbursts with supervisors, argues with adults/leaders. None of these things are usual behaviors for him when the medication works well. Mom said that it occurs all during the day and doesn't seem to be a wearing off effect. He is sleeping at night with Melatonin and Clonidine, and has been otherwise healthy. 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 He was doing well at it, being paid on hourly wage, but recently has had the problems above a mentioned. He is taking Focalin XR  and Mom asked if the dose could increase slightly to see if it would help until his revisit here on Sept 11, 2015. Mom can be reached at [phone].   Dr Sharene Skeans, are you ok with increasing to Focalin XR 35 by adding XR  for 1 week and then 2 of the XR  on the following week if his behavior has not improved? He has almost a month's supply of the Focalin XR  to use so we could add to that until his appointment if you agree. Inetta Fermo

## 2013-10-01 NOTE — Telephone Encounter (Signed)
I called instructions to Mom. She will pick up Rx tomorrow. TG

## 2013-10-01 NOTE — Telephone Encounter (Signed)
That would be fine, I wonder whether or not we should consider Intuniv.  I also wonder if there other issues such as anxiety, depression, or some other reason why he is acting out and argumentative.

## 2013-10-16 ENCOUNTER — Ambulatory Visit (INDEPENDENT_AMBULATORY_CARE_PROVIDER_SITE_OTHER): Payer: Medicaid Other | Admitting: Family

## 2013-10-16 ENCOUNTER — Encounter: Payer: Self-pay | Admitting: Family

## 2013-10-16 VITALS — BP 134/84 | HR 90 | Ht <= 58 in | Wt 144.8 lb

## 2013-10-16 DIAGNOSIS — G2569 Other tics of organic origin: Secondary | ICD-10-CM

## 2013-10-16 DIAGNOSIS — G8310 Monoplegia of lower limb affecting unspecified side: Secondary | ICD-10-CM

## 2013-10-16 DIAGNOSIS — F988 Other specified behavioral and emotional disorders with onset usually occurring in childhood and adolescence: Secondary | ICD-10-CM

## 2013-10-16 DIAGNOSIS — H905 Unspecified sensorineural hearing loss: Secondary | ICD-10-CM

## 2013-10-16 DIAGNOSIS — G4700 Insomnia, unspecified: Secondary | ICD-10-CM

## 2013-10-16 MED ORDER — CLONIDINE HCL 0.1 MG PO TABS: ORAL_TABLET | ORAL | Status: DC

## 2013-10-16 NOTE — Progress Notes (Signed)
Patient: [name] MRN: [id number] Sex: male DOB: Jun 07, 1990  Provider: Elveria Rising, NP Location of Care: [facility]  Note type: Routine return visit  History of Present Illness: Referral Source: Dr. Lerry Liner History from: his mother Chief Complaint: ADHD  [name] is a 23 y.o. 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 He was last seen March 03, 2013. [name] used to take Adderall for his problems with attention but had and episode of delirium that his mother thought was related to the change from generic to brand Adderall. He was tried off any neurostimulant medication and had increasing difficulties with attention span. Ultimately, [name] was started on generic Focalin XR. The dose has been gradually increased and he has tolerated it well. His mother called on October 01, 2013 saying that [name] was having problems with behavior. She said that he was less focused, having angry outbursts, and was arguing with his supervisors and other adults. These behaviors were not typical for him and he had been terminated from his employment as a result of the behaviors. Mom asked if the Focalin XR dose could increase. The dose was increased but I asked Mom to bring [name] in for an evaluation as several increases had occurred as a result of phone calls.   Today Mom reports that the recent increase to generic Focalin XR 35 has been effective. She said that [name] mood and behavior has been appropriate and that he has been more attentive and less argumentative. She has been helping him to look for new employment. [name] has not had an increase in tics as a result of increase in generic Focalin XR.   [name] has been healthy since last seen. He says that he has been exercising  in a gym regularly.  He takes Clonidine to help him sleep and says that the current dose works well.   Review of Systems: 12 system review was unremarkable  Past Medical History  Diagnosis Date  . Tourette's syndrome   . Tic disorder   . Scoliosis   . Hemiatrophy of right leg   . Club foot     Right   Hospitalizations: No., Head Injury: No., Nervous System Infections: No., Immunizations up to date: Yes.   Past Medical History Comments: see Hx.  Surgical History Past Surgical History  Procedure Laterality Date  . Foot surgery Right 2002  . Eye muscle surgery Bilateral 1998 and 2002  . Femoral hernia repair      Done when he was an infant  . Harrington rod surgery  2003    For Scoliosis    Family History family history includes Breast cancer in his maternal grandmother; Hypertension in an other family member; Prostate cancer in his maternal grandfather. Family History is otherwise negative for migraines, seizures, cognitive impairment, blindness, deafness, birth defects, chromosomal disorder, autism.  Social History History   Social History  . Marital Status: Single    Spouse Name: N/A    Number of Children: N/A  . Years of Education: N/A   Social History Main Topics  . Smoking status: Never Smoker   . Smokeless tobacco: Never Used  . Alcohol Use: No  . Drug Use: No  . Sexual Activity: No   Other Topics Concern  . None   Social History Narrative  . 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  Physical Exam BP 134/84  Pulse 90  Ht 4' 7.75" (1.416 m)  Wt 144 lb 12.8 oz (65.681 kg)  BMI 32.76 kg/m2 General: alert, well developed, well nourished short statured young man, in no acute distress, right-handed  Head: normocephalic, no dysmorphic features  Ears, Nose and Throat: Otoscopic:  tympanic membranes normal . Pharynx: oropharynx is pink without exudates or tonsillar hypertrophy.  Neck: short neck, diminished range of motion, no cranial or cervical bruits  Respiratory: auscultation clear  Cardiovascular: no murmurs, pulses are normal  Musculoskeletal: right hemiatrophy most evident in the distal leg which is wasted. He wears custom orthopedic lift shoes. He has repaired thoracolumbar scoliosis  Skin: no rashes or neurocutaneous lesions  Trunk: he has a healed gastrostomy scar in his abdomen.  Neurologic Exam  Mental Status: alert; Oriented to place and name. He was quiet and attentive during the examination. His speech was rapid but he has no dysphasia or dysarthria  Cranial Nerves: visual fields are full to double simultaneous stimuli; extraocular movements are full and conjugate; pupils are round reactive to light; funduscopic examination shows sharp disc margins with normal vessels; symmetric facial strength; midline tongue and uvula; hearing is normal and symmetric  Motor: He has mild weakness in the right leg distally but fairly good proximal strength. He has normal strength In both arms and left leg. His fine motor skills are equal.  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 He has been having increasing problems with attention span and the generic Focalin XR dose was  increased on October 01, 2013. Thus far, this has worked to help with attention and has decreased problems with outbursts and defiance. If he continues to have problems with behavior, the next medication we will consider for him will be Intuniv. Mikael's blood pressure today was 134/84, which is higher than he has had in the past. I asked his mother to recheck it at a later time to see if it is still elevated. If it is, I instructed her to have him follow up with his PCP. I will see Lloyd back in follow up in 6 months or sooner if needed. Mom agreed with these plans.

## 2013-10-18 ENCOUNTER — Encounter: Payer: Self-pay | Admitting: Family

## 2013-10-18 NOTE — Patient Instructions (Signed)
Continue [name]'s generic Focalin XR  (using  and  capsules to total ). When he needs a refill again, I will give him a new prescription for generic Focalin XR 35 mg so he will not have to take so many pills.   Let me know if he has problems with attention or behavior. If that occurs, the next medication that we will try is adding Intuniv to the generic Focalin XR.   [name]'s blood pressure is slightly elevated today at 134/84. This is higher than it has been at this office in the past. I recommend rechecking that in the next few days and if it remains elevated, follow up with is primary care provider.   I will see [name] back in follow up in 6 months or sooner if needed. Call me for any questions or concerns.

## 2013-11-03 ENCOUNTER — Other Ambulatory Visit: Payer: Self-pay | Admitting: Family

## 2013-11-03 MED ORDER — DEXMETHYLPHENIDATE HCL ER 35 MG PO CP24: ORAL_CAPSULE | ORAL | Status: DC

## 2013-11-03 NOTE — Telephone Encounter (Signed)
I called Mom to pick up the generic Focalin XR 35mg  Rx TG

## 2013-11-09 ENCOUNTER — Telehealth: Payer: Self-pay | Admitting: Family

## 2013-11-09 NOTE — Telephone Encounter (Signed)
I called and no one picked up.  I let the phone ring for a minute.

## 2013-11-09 NOTE — Telephone Encounter (Signed)
[name] returned the call. She said she will pick up the next call. She can be reached at [phone].

## 2013-11-09 NOTE — Telephone Encounter (Signed)
Mom Malka Songela Lane left message about [name]. She said that he is showing very disturbing behavior. He is very emotional and is "all over the place". He has been cursing people, calling them bad names, has struck out at some people. But then over past day says that he wants to kill people and saying how he is going to do it. Mom says that his behavior has been different since he had the problem with the Adderall back in December, and Mom wonders if he needs he needs to have tests done or psychiatric evaluation. Mom said that she called [facility] and they told her to call Dr Sharene SkeansHickling since you prescribe Focalin. Mom said that family that was allowing him to do part time work and spend time during the day with him, now feels unsafe with him. He has had a niece as an [employer] provider and he has been directing aggression and hostility toward her - something he would have not done in the past. He doesn't tend to be as bad with his parents as he is with everyone else. Mom said that he does sleep at night - is not up pacing or aggressive at night. Mom says that he is very angry about not having a job and not getting paid for tasks that he does, and doesn't understand that he is not going to be paid with the behavior he is showing, but that does not seem to be all that is causing the angry behavior. The [employer] worker doesn't feel safe to keep him any more and parents are taking him back home until his behavior improves. Mom feels that they are in crisis and do not know what to do since Behavioral Health said to call this office. Mom wants to talk to Dr Sharene SkeansHickling. She can be reached at [phone]. TG

## 2013-11-09 NOTE — Telephone Encounter (Signed)
I have attempted to contact [name] unsuccessfully, because I was not able to leave a message.  My recommendation would be that the patient be taken to [name].  If they won't see him, then he should be taken to the emergency room for evaluation.  This is not an issue of Focalin.  It is clearly an issue of behavior, and we need the assistance of [name] with this young man.  If she calls back, I will be happy to speak with her.

## 2013-11-10 NOTE — Telephone Encounter (Signed)
I spoke with mother, [name] and recommended that she contact her primary physician for a psychiatric evaluation.  If the situation escalates, they may need to bring him to the emergency room for evaluation.  If the situation escalates beyond that, they may need need to call the police to help diffuses situation and transport him.

## 2013-12-09 ENCOUNTER — Telehealth: Payer: Self-pay

## 2013-12-09 MED ORDER — DEXMETHYLPHENIDATE HCL ER 35 MG PO CP24: ORAL_CAPSULE | ORAL | Status: DC

## 2013-12-09 NOTE — Telephone Encounter (Signed)
[name], mom, lvm requesting Rx for patient's Dexmethylphenidate HCI 35 mg. She stated that he is doing well and she will p/u Rx at our office tomorrow.

## 2013-12-09 NOTE — Telephone Encounter (Signed)
Called [name] and let her know the Rx was placed at the front desk for p/u.

## 2013-12-16 ENCOUNTER — Telehealth: Payer: Self-pay

## 2013-12-16 DIAGNOSIS — G2569 Other tics of organic origin: Secondary | ICD-10-CM

## 2013-12-16 MED ORDER — CLONIDINE HCL 0.1 MG PO TABS: ORAL_TABLET | ORAL | Status: DC

## 2013-12-16 NOTE — Telephone Encounter (Signed)
Rx sent electronically. TG 

## 2013-12-16 NOTE — Telephone Encounter (Signed)
Marylene LandAngela, mom, called requesting refill on pt's Clonidine and wants [facility] [address]. Mom was told to check with pharmacy later today for the refill.

## 2014-01-11 ENCOUNTER — Telehealth: Payer: Self-pay

## 2014-01-11 MED ORDER — DEXMETHYLPHENIDATE HCL ER 35 MG PO CP24: ORAL_CAPSULE | ORAL | Status: DC

## 2014-01-11 NOTE — Telephone Encounter (Signed)
Mom said that she would be in tomorrow to get the Rx.

## 2014-01-11 NOTE — Telephone Encounter (Signed)
I put Focalin Rx at front desk for Mom. Would you let her know? Thanks [facility]

## 2014-01-11 NOTE — Telephone Encounter (Signed)
[name], mom, called requesting Rx for son's generic Focalin 35 mg. She asked that it be placed at the front desk and she will pick it up tomorrow.

## 2014-02-08 ENCOUNTER — Other Ambulatory Visit: Payer: Self-pay

## 2014-02-08 MED ORDER — DEXMETHYLPHENIDATE HCL ER 35 MG PO CP24: ORAL_CAPSULE | ORAL | Status: DC

## 2014-02-08 NOTE — Telephone Encounter (Signed)
Placed at front desk for pick up.

## 2014-02-08 NOTE — Telephone Encounter (Signed)
[name], mom, called and requested Rx for Focalin 35 mg, and Clonidine 0.1 mg. I let her know that the Clonidine still has refills on it and refill can be requested through the pharmacy. She expressed understanding. [name] would like to pick up the Focalin Rx at our office on Wednesday. I told her that it will be placed at the front desk for her.

## 2014-03-11 ENCOUNTER — Other Ambulatory Visit: Payer: Self-pay | Admitting: Family

## 2014-03-11 ENCOUNTER — Telehealth: Payer: Self-pay | Admitting: Family

## 2014-03-11 MED ORDER — DEXMETHYLPHENIDATE HCL ER 35 MG PO CP24: ORAL_CAPSULE | ORAL | Status: DC

## 2014-03-11 NOTE — Telephone Encounter (Signed)
Mom Malka Songela Lane left message about [name]. Mom said that she wants to pick up Focalin Rx tomorrow. I called MOm and let her know that the Rx was ready. TG

## 2014-03-31 ENCOUNTER — Emergency Department (HOSPITAL_COMMUNITY): Payer: Medicaid Other

## 2014-03-31 ENCOUNTER — Other Ambulatory Visit (HOSPITAL_COMMUNITY): Payer: Self-pay

## 2014-03-31 ENCOUNTER — Inpatient Hospital Stay (HOSPITAL_COMMUNITY)
Admission: EM | Admit: 2014-03-31 | Discharge: 2014-04-28 | DRG: 004 | Disposition: A | Discharge status: Home / Self Care | Payer: Medicaid Other | Attending: Internal Medicine | Admitting: Internal Medicine

## 2014-03-31 ENCOUNTER — Inpatient Hospital Stay (HOSPITAL_COMMUNITY): Payer: Medicaid Other

## 2014-03-31 ENCOUNTER — Encounter (HOSPITAL_COMMUNITY): Payer: Self-pay | Admitting: Emergency Medicine

## 2014-03-31 DIAGNOSIS — J9602 Acute respiratory failure with hypercapnia: Secondary | ICD-10-CM

## 2014-03-31 DIAGNOSIS — Q6689 Other  specified congenital deformities of feet: Secondary | ICD-10-CM

## 2014-03-31 DIAGNOSIS — G4700 Insomnia, unspecified: Secondary | ICD-10-CM | POA: Diagnosis present

## 2014-03-31 DIAGNOSIS — M2609 Other specified anomalies of jaw size: Secondary | ICD-10-CM | POA: Diagnosis present

## 2014-03-31 DIAGNOSIS — I10 Essential (primary) hypertension: Secondary | ICD-10-CM | POA: Diagnosis present

## 2014-03-31 DIAGNOSIS — K5900 Constipation, unspecified: Secondary | ICD-10-CM | POA: Diagnosis present

## 2014-03-31 DIAGNOSIS — J9601 Acute respiratory failure with hypoxia: Secondary | ICD-10-CM | POA: Diagnosis present

## 2014-03-31 DIAGNOSIS — M419 Scoliosis, unspecified: Secondary | ICD-10-CM | POA: Diagnosis present

## 2014-03-31 DIAGNOSIS — R1313 Dysphagia, pharyngeal phase: Secondary | ICD-10-CM | POA: Diagnosis present

## 2014-03-31 DIAGNOSIS — J189 Pneumonia, unspecified organism: Secondary | ICD-10-CM

## 2014-03-31 DIAGNOSIS — R069 Unspecified abnormalities of breathing: Secondary | ICD-10-CM | POA: Diagnosis present

## 2014-03-31 DIAGNOSIS — J9691 Respiratory failure, unspecified with hypoxia: Secondary | ICD-10-CM | POA: Diagnosis present

## 2014-03-31 DIAGNOSIS — N179 Acute kidney failure, unspecified: Secondary | ICD-10-CM | POA: Diagnosis present

## 2014-03-31 DIAGNOSIS — E872 Acidosis: Secondary | ICD-10-CM | POA: Diagnosis present

## 2014-03-31 DIAGNOSIS — F952 Tourette's disorder: Secondary | ICD-10-CM | POA: Diagnosis present

## 2014-03-31 DIAGNOSIS — R1310 Dysphagia, unspecified: Secondary | ICD-10-CM | POA: Diagnosis not present

## 2014-03-31 DIAGNOSIS — J9600 Acute respiratory failure, unspecified whether with hypoxia or hypercapnia: Secondary | ICD-10-CM

## 2014-03-31 DIAGNOSIS — R1311 Dysphagia, oral phase: Secondary | ICD-10-CM | POA: Diagnosis present

## 2014-03-31 DIAGNOSIS — J984 Other disorders of lung: Secondary | ICD-10-CM | POA: Diagnosis present

## 2014-03-31 DIAGNOSIS — D649 Anemia, unspecified: Secondary | ICD-10-CM | POA: Diagnosis present

## 2014-03-31 DIAGNOSIS — R451 Restlessness and agitation: Secondary | ICD-10-CM | POA: Diagnosis present

## 2014-03-31 DIAGNOSIS — E870 Hyperosmolality and hypernatremia: Secondary | ICD-10-CM | POA: Diagnosis present

## 2014-03-31 DIAGNOSIS — Z981 Arthrodesis status: Secondary | ICD-10-CM

## 2014-03-31 DIAGNOSIS — Q761 Klippel-Feil syndrome: Secondary | ICD-10-CM

## 2014-03-31 DIAGNOSIS — R0600 Dyspnea, unspecified: Secondary | ICD-10-CM | POA: Diagnosis not present

## 2014-03-31 DIAGNOSIS — R400 Somnolence: Secondary | ICD-10-CM | POA: Insufficient documentation

## 2014-03-31 DIAGNOSIS — T85598A Other mechanical complication of other gastrointestinal prosthetic devices, implants and grafts, initial encounter: Secondary | ICD-10-CM

## 2014-03-31 DIAGNOSIS — R0902 Hypoxemia: Secondary | ICD-10-CM

## 2014-03-31 DIAGNOSIS — Z4659 Encounter for fitting and adjustment of other gastrointestinal appliance and device: Secondary | ICD-10-CM

## 2014-03-31 DIAGNOSIS — Z9289 Personal history of other medical treatment: Secondary | ICD-10-CM | POA: Diagnosis not present

## 2014-03-31 DIAGNOSIS — G9341 Metabolic encephalopathy: Secondary | ICD-10-CM | POA: Diagnosis present

## 2014-03-31 DIAGNOSIS — F988 Other specified behavioral and emotional disorders with onset usually occurring in childhood and adolescence: Secondary | ICD-10-CM | POA: Diagnosis present

## 2014-03-31 DIAGNOSIS — J80 Acute respiratory distress syndrome: Secondary | ICD-10-CM | POA: Diagnosis not present

## 2014-03-31 DIAGNOSIS — R0602 Shortness of breath: Secondary | ICD-10-CM

## 2014-03-31 DIAGNOSIS — J9690 Respiratory failure, unspecified, unspecified whether with hypoxia or hypercapnia: Secondary | ICD-10-CM

## 2014-03-31 DIAGNOSIS — Z930 Tracheostomy status: Secondary | ICD-10-CM

## 2014-03-31 LAB — BASIC METABOLIC PANEL
ANION GAP: 11 (ref 5–15)
BUN: 16 mg/dL (ref 6–23)
CALCIUM: 9.2 mg/dL (ref 8.4–10.5)
CHLORIDE: 101 mmol/L (ref 96–112)
CO2: 30 mmol/L (ref 19–32)
Creatinine, Ser: 0.95 mg/dL (ref 0.50–1.35)
GFR calc Af Amer: >90 mL/min (ref >90)
GFR calc non Af Amer: >90 mL/min (ref >90)
GLUCOSE: 97 mg/dL (ref 70–99)
Potassium: 4.8 mmol/L (ref 3.5–5.1)
SODIUM: 142 mmol/L (ref 135–145)

## 2014-03-31 LAB — CBC WITH DIFFERENTIAL/PLATELET
BASOS PCT: 0 % (ref 0–1)
Basophils Absolute: 0 10*3/uL (ref 0.0–0.1)
EOS PCT: 1 % (ref 0–5)
Eosinophils Absolute: 0.1 10*3/uL (ref 0.0–0.7)
HEMATOCRIT: 53.4 % — AB (ref 39.0–52.0)
Hemoglobin: 16.6 g/dL (ref 13.0–17.0)
Lymphocytes Relative: 16 % (ref 12–46)
Lymphs Abs: 1.3 10*3/uL (ref 0.7–4.0)
MCH: 29.5 pg (ref 26.0–34.0)
MCHC: 31.1 g/dL (ref 30.0–36.0)
MCV: 95 fL (ref 78.0–100.0)
MONO ABS: 1.2 10*3/uL — AB (ref 0.1–1.0)
Monocytes Relative: 15 % — ABNORMAL HIGH (ref 3–12)
NEUTROS PCT: 68 % (ref 43–77)
Neutro Abs: 5.7 10*3/uL (ref 1.7–7.7)
Platelets: 227 10*3/uL (ref 150–400)
RBC: 5.62 MIL/uL (ref 4.22–5.81)
RDW: 14 % (ref 11.5–15.5)
WBC: 8.3 10*3/uL (ref 4.0–10.5)

## 2014-03-31 LAB — I-STAT ARTERIAL BLOOD GAS, ED
Acid-Base Excess: 4 mmol/L — ABNORMAL HIGH (ref 0.0–2.0)
Acid-Base Excess: 3 mmol/L — ABNORMAL HIGH (ref 0.0–2.0)
Bicarbonate: 34.6 mEq/L — ABNORMAL HIGH (ref 20.0–24.0)
Bicarbonate: 34.7 mEq/L — ABNORMAL HIGH (ref 20.0–24.0)
O2 Saturation: 100 %
O2 Saturation: 79 %
PH ART: 7.275 — AB (ref 7.350–7.450)
PO2 ART: 315 mmHg — AB (ref 80.0–100.0)
PO2 ART: 51 mmHg — AB (ref 80.0–100.0)
Patient temperature: 97.2
TCO2: 37 mmol/L (ref 0–100)
TCO2: 37 mmol/L (ref 0–100)
pCO2 arterial: 74.4 mmHg (ref 35.0–45.0)
pCO2 arterial: 79.5 mmHg (ref 35.0–45.0)
pH, Arterial: 7.243 — ABNORMAL LOW (ref 7.350–7.450)

## 2014-03-31 LAB — BLOOD GAS, ARTERIAL
Acid-Base Excess: 2.2 mmol/L — ABNORMAL HIGH (ref 0.0–2.0)
Bicarbonate: 30.4 mEq/L — ABNORMAL HIGH (ref 20.0–24.0)
Drawn by: 39866
FIO2: 1 %
MECHVT: 320 mL
O2 Saturation: 99 %
PCO2 ART: 86.1 mmHg — AB (ref 35.0–45.0)
PEEP: 5 cmH2O
Patient temperature: 96.9
RATE: 18 resp/min
TCO2: 33.2 mmol/L (ref 0–100)
pH, Arterial: 7.166 — CL (ref 7.350–7.450)
pO2, Arterial: 353 mmHg — ABNORMAL HIGH (ref 80.0–100.0)

## 2014-03-31 LAB — I-STAT CG4 LACTIC ACID, ED: Lactic Acid, Venous: 1.27 mmol/L (ref 0.5–2.0)

## 2014-03-31 MED ORDER — PROPOFOL 10 MG/ML IV BOLUS
100.0000 mg | Freq: Once | INTRAVENOUS | Status: AC
  Administered 2014-03-31: 100 mg via INTRAVENOUS

## 2014-03-31 MED ORDER — SUCCINYLCHOLINE CHLORIDE 20 MG/ML IJ SOLN
INTRAMUSCULAR | Status: AC
  Administered 2014-03-31: 120 mg via INTRAVENOUS
  Filled 2014-03-31: qty 1

## 2014-03-31 MED ORDER — ROCURONIUM BROMIDE 50 MG/5ML IV SOLN
INTRAVENOUS | Status: AC
  Filled 2014-03-31: qty 2

## 2014-03-31 MED ORDER — FENTANYL CITRATE 0.05 MG/ML IJ SOLN
100.0000 ug | INTRAMUSCULAR | Status: DC | PRN
  Administered 2014-03-31: 100 ug via INTRAVENOUS
  Filled 2014-03-31: qty 2

## 2014-03-31 MED ORDER — PROPOFOL 10 MG/ML IV EMUL
0.0000 ug/kg/min | INTRAVENOUS | Status: DC
  Administered 2014-03-31: 10 ug/kg/min via INTRAVENOUS
  Administered 2014-04-01: 50 ug/kg/min via INTRAVENOUS
  Filled 2014-03-31 (×2): qty 100

## 2014-03-31 MED ORDER — ETOMIDATE 2 MG/ML IV SOLN: 20.0000 mg | Freq: Once | INTRAVENOUS | Status: AC

## 2014-03-31 MED ORDER — ETOMIDATE 2 MG/ML IV SOLN
INTRAVENOUS | Status: AC
  Filled 2014-03-31: qty 20

## 2014-03-31 MED ORDER — IOHEXOL 350 MG/ML SOLN
80.0000 mL | Freq: Once | INTRAVENOUS | Status: AC | PRN
  Administered 2014-03-31: 80 mL via INTRAVENOUS

## 2014-03-31 MED ORDER — ETOMIDATE 2 MG/ML IV SOLN
INTRAVENOUS | Status: DC | PRN
  Administered 2014-03-31 (×2): 20 mg via INTRAVENOUS

## 2014-03-31 MED ORDER — PROPOFOL 10 MG/ML IV EMUL
INTRAVENOUS | Status: AC
  Administered 2014-03-31: 100 mg via INTRAVENOUS
  Filled 2014-03-31: qty 100

## 2014-03-31 MED ORDER — IOHEXOL 350 MG/ML SOLN: 100.0000 mL | Freq: Once | INTRAVENOUS | Status: AC | PRN

## 2014-03-31 MED ORDER — HEPARIN SODIUM (PORCINE) 5000 UNIT/ML IJ SOLN
5000.0000 [IU] | Freq: Three times a day (TID) | INTRAMUSCULAR | Status: DC
  Administered 2014-04-01 – 2014-04-09 (×26): 5000 [IU] via SUBCUTANEOUS
  Filled 2014-03-31 (×29): qty 1

## 2014-03-31 MED ORDER — PANTOPRAZOLE SODIUM 40 MG IV SOLR
40.0000 mg | Freq: Every day | INTRAVENOUS | Status: DC
  Administered 2014-04-01 (×2): 40 mg via INTRAVENOUS
  Filled 2014-03-31 (×3): qty 40

## 2014-03-31 MED ORDER — SODIUM CHLORIDE 0.9 % IV SOLN
INTRAVENOUS | Status: DC
  Administered 2014-03-31: 100 mL/h via INTRAVENOUS

## 2014-03-31 MED ORDER — LIDOCAINE HCL (CARDIAC) 20 MG/ML IV SOLN
INTRAVENOUS | Status: AC
  Filled 2014-03-31: qty 5

## 2014-03-31 MED ORDER — CHLORHEXIDINE GLUCONATE 0.12 % MT SOLN
15.0000 mL | Freq: Two times a day (BID) | OROMUCOSAL | Status: DC
  Administered 2014-04-01 – 2014-04-24 (×45): 15 mL via OROMUCOSAL
  Filled 2014-03-31 (×49): qty 15

## 2014-03-31 MED ORDER — CETYLPYRIDINIUM CHLORIDE 0.05 % MT LIQD
7.0000 mL | Freq: Four times a day (QID) | OROMUCOSAL | Status: DC
  Administered 2014-03-31 – 2014-04-25 (×93): 7 mL via OROMUCOSAL

## 2014-03-31 MED ORDER — SUCCINYLCHOLINE CHLORIDE 20 MG/ML IJ SOLN
120.0000 mg | Freq: Once | INTRAMUSCULAR | Status: AC
  Administered 2014-03-31: 120 mg via INTRAVENOUS

## 2014-03-31 MED ORDER — SODIUM CHLORIDE 0.9 % IV SOLN
250.0000 mL | INTRAVENOUS | Status: DC | PRN
  Administered 2014-04-05: 250 mL via INTRAVENOUS

## 2014-03-31 MED ORDER — PROPOFOL 10 MG/ML IV BOLUS
INTRAVENOUS | Status: AC
  Filled 2014-03-31: qty 20

## 2014-03-31 NOTE — H&P (Signed)
PULMONARY / CRITICAL CARE MEDICINE   Name: [name] MRN: [id number] DOB: 07/05/1990    ADMISSION DATE:  03/31/2014 CONSULTATION DATE:  03/31/2014  REFERRING MD :  EDP  CHIEF COMPLAINT:  SOB  INITIAL PRESENTATION:  24 y.o. M [facility] with SOB.  In ED, he was hypoxic with sats in 60's.  He was placed on BiPAP but repeat ABG 3 hours later showed progressive respiratory decline.  Given his hx of cervical fusion, micrognathia, prior trach, etc. (which all predispose him to difficult airway), he was intubated prior to ICU admission.  STUDIES:  CTA chest 2/24 >>> negative for PE, limited study due to motion.  SIGNIFICANT EVENTS: 2/24 - admit   HISTORY OF PRESENT ILLNESS:  Pt is encephalopathic; therefore, this HPI is obtained from chart review. [name] is a 24 y.o. 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  [facility] with SOB that began earlier that day.  Mother reports that he was unable to walk more than a few feet before becoming SOB and that this is not typical for him despite his musculoskeletal deformities.  She reports that although he has shorter right leg compared to left and right club foot, he still walks and goes about daily activities independently. When his SOB began and did not resolve, mother decided to bring him to ED for further evaluation. In triage, his SpO2 was noted to be in 60's on room air.  ABG revealed hypercarbic respiratory failure for which he was placed on BiPAP.  After 3 hours on BiPAP, repeat ABG revealed worsening hypercarbia and pt was gradually beginning to get anxious due to BiPAP discomfort.  He was requesting that he have a break from mask and began trying to manipulate face mask. Due to his underlying medical problems including multiple musculoskeletal  deformities, micrognathia, and hx of tracheostomy, I had discussion with pt's mother and expressed my concern for the potential of continued respiratory decline leading to possibility of requiring emergent intubation in a patient who more than likely will be a difficult airway. She understood the concern and agreed that elective intubation would be safer option.  Pt was intubated by EDP prior to being transported to the ICU.   PAST MEDICAL HISTORY :   has a past medical history of Tourette's syndrome; Tic disorder; Scoliosis; Hemiatrophy of right leg; and Club foot.  has past surgical history that includes Foot surgery (Right, 2002); Eye muscle surgery (Bilateral, 1998 and 2002); Femoral hernia repair; and Harrington Rod Surgery (2003). Prior to Admission medications   Medication Sig Start Date End Date Taking? Authorizing Provider  amphetamine-dextroamphetamine (ADDERALL) 10 MG tablet Take 5 mg by mouth daily with breakfast.   Yes Historical Provider, MD  cloNIDine (CATAPRES) 0.1 MG tablet TAKE 3 TABLETS BY MOUTH AT BEDTIME 03/11/14  Yes Elveria Risingina Goodpasture, NP  Dexmethylphenidate HCl 35 MG CP24 Take 1 capsule every morning 03/11/14  Yes Elveria Risingina Goodpasture, NP   No Known Allergies  FAMILY HISTORY:  Family History  Problem Relation Age of Onset  . Breast cancer Maternal Grandmother     Died in her mid 6070's  . Prostate cancer Maternal Grandfather     Died in his 2980's  . Hypertension      SOCIAL HISTORY:  reports that he has never smoked. He has never  used smokeless tobacco. He reports that he does not drink alcohol or use illicit drugs.  REVIEW OF SYSTEMS:  Unable to obtain as pt is on BiPAP and not able to communicate clearly.  SUBJECTIVE:   VITAL SIGNS: Temp:  [97.2 F (36.2 C)] 97.2 F (36.2 C) (02/24 1823) Pulse Rate:  [84-123] 84 (02/24 2130) Resp:  [20-38] 20 (02/24 2130) BP: (129-175)/(88-134) 155/100 mmHg (02/24 2130) SpO2:  [63 %-100 %] 95 % (02/24 2130) FiO2 (%):  [50 %-70 %]  50 % (02/24 2031) Weight:  [68.7 kg (151 lb 7.3 oz)] 68.7 kg (151 lb 7.3 oz) (02/24 2130) HEMODYNAMICS:   VENTILATOR SETTINGS: Vent Mode:  [-] BIPAP FiO2 (%):  [50 %-70 %] 50 % Set Rate:  [8 bmp] 8 bmp PEEP:  [5 cmH20] 5 cmH20 INTAKE / OUTPUT: Intake/Output      02/24 0701 - 02/25 0700   Urine (mL/kg/hr) 150   Total Output 150   Net -150         PHYSICAL EXAMINATION: General: Young male, in NAD. Neuro: Somnolent but awakens to physical stimuli. HEENT: [place]/AT. PERRL, sclerae anicteric.  Micrognathia. Cardiovascular: RRR, no M/R/G.  Lungs: Respirations shallow and unlabored, decreased BS on L.  Otherwise CTA. Abdomen: BS x 4, soft, NT/ND.  Musculoskeletal: R club foot, R hemiatrophy. Skin: Intact, warm, no rashes.  LABS:  CBC  Recent Labs Lab 03/31/14 1815  WBC 8.3  HGB 16.6  HCT 53.4*  PLT 227   Coag's No results for input(s): APTT, INR in the last 168 hours. BMET  Recent Labs Lab 03/31/14 1815  NA 142  K 4.8  CL 101  CO2 30  BUN 16  CREATININE 0.95  GLUCOSE 97   Electrolytes  Recent Labs Lab 03/31/14 1815  CALCIUM 9.2   Sepsis Markers  Recent Labs Lab 03/31/14 1837  LATICACIDVEN 1.27   ABG  Recent Labs Lab 03/31/14 1919 03/31/14 2211  PHART 7.275* 7.243*  PCO2ART 74.4* 79.5*  PO2ART 315.0* 51.0*   Liver Enzymes No results for input(s): AST, ALT, ALKPHOS, BILITOT, ALBUMIN in the last 168 hours. Cardiac Enzymes No results for input(s): TROPONINI, PROBNP in the last 168 hours. Glucose No results for input(s): GLUCAP in the last 168 hours.  Imaging Ct Angio Chest Pe W/cm &/or Wo Cm  03/31/2014   CLINICAL DATA:  Shortness of breath, hypoxia  EXAM: CT ANGIOGRAPHY CHEST WITH CONTRAST  TECHNIQUE: Multidetector CT imaging of the chest was performed using the standard protocol during bolus administration of intravenous contrast. Multiplanar CT image reconstructions and MIPs were obtained to evaluate the vascular anatomy.  CONTRAST:  80mL  OMNIPAQUE IOHEXOL 350 MG/ML SOLN  COMPARISON:  None.  FINDINGS: Evaluation is severely limited due to motion degradation as well as streak artifact from the patient's spinal fixation hardware.  Within that constraint, there is no evidence of pulmonary embolism.  Mediastinum/Nodes: Cardiomegaly.  No pericardial effusion.  No suspicious mediastinal, hilar, or axillary lymphadenopathy.  Lungs/Pleura: Evaluation of the lung parenchyma is constrained by respiratory motion.  Mild patchy opacity in the lingula/lateral left lower lobe and superior right lower lobe, likely atelectasis.  No pleural effusion or pneumothorax.  Upper abdomen: Visualized upper abdomen is unremarkable.  Musculoskeletal: Thoracolumbar scoliosis with spinal fixation rods. Multiple chronic rib deformities.  Review of the MIP images confirms the above findings.  IMPRESSION: Superior limited evaluation due to motion degradation.  No evidence of pulmonary embolism.  No evidence of acute cardiopulmonary disease.   Electronically Signed   By:  Charline Bills M.D.   On: 03/31/2014 21:00   Dg Chest Portable 1 View  03/31/2014   CLINICAL DATA:  Extreme shortness of breath  EXAM: PORTABLE CHEST - 1 VIEW  COMPARISON:  01/30/2013  FINDINGS: Low lung volumes.  Left lung base is obscured.  Cardiomegaly.  Multiple rib deformities, chronic.  Harrington spinal fixation rods.  IMPRESSION: Low lung volumes.  No evidence of acute cardiopulmonary disease.   Electronically Signed   By: Charline Bills M.D.   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  Wean as able. VAP bundle. SBT in AM if able. ABG and CXR in AM.  CARDIOVASCULAR A:  No acute issues P:  Monitor hemodynamics.  RENAL A:   No acute issues P:    NS @ 100. BMP in AM.  GASTROINTESTINAL A:   GI prophylaxis Nutrition P:   SUP: Pantoprazole. NPO. TF if remains NPO > 24 hours.  HEMATOLOGIC A:   VTE Prophylaxis P:  SCD's / Heparin. CBC in AM.  INFECTIOUS A:   No indication of infection P:   Monitor clinically.  ENDOCRINE A:   No known issues P:   SSI if glucose consistently > 180.  NEUROLOGIC A:   Acute metabolic encephalopathy Hx Insomnia, Tourette's syndrome, tic disorder, scoliosis s/p surgical fixation, right hemiatrophy, right club foot P:   Sedation:  Propofol gtt / Fentanyl PRN. RASS goal: 0 to -1. Daily WUA. Hold outpatient clonidine, adderall, dexmethylphenidate.  Family updated: Mother and grandmother at bedside.  Interdisciplinary Family Meeting v Palliative Care Meeting:  Due by: 3/1.  CC time: 40 minutes.   Rutherford Guys, [place] - [address] [facility] Pager: [phone]  or [phone] 03/31/2014, 10:39 PM

## 2014-03-31 NOTE — ED Notes (Signed)
Report attempted 

## 2014-03-31 NOTE — ED Notes (Signed)
Pt c/o sob x 3-4 days with difficulty sleeping dt breathing. Pt tachypnic taking short shallow breathes. Father sts pt has a significally smaller lung and a significantly larger lung.

## 2014-03-31 NOTE — ED Provider Notes (Signed)
CSN: [id number]     Arrival date & time 03/31/14  1736 History   First MD Initiated Contact with Patient 03/31/14 1801     Chief Complaint  Patient presents with  . Shortness of Breath   (Consider location/radiation/quality/duration/timing/severity/associated sxs/prior Treatment) Patient is a 24 y.o. male presenting with shortness of breath. The history is provided by the EMS personnel and a parent. No language interpreter was used.  Shortness of Breath Severity:  Severe Onset quality:  Gradual Duration: 2 weeks, worse in the last 3 days. Timing:  Constant Progression:  Worsening Context: URI   Relieved by:  Nothing Worsened by:  Nothing tried Ineffective treatments:  Position changes, rest and sitting up Associated symptoms: chest pain and cough   Associated symptoms: no abdominal pain, no diaphoresis, no fever, no headaches, no hemoptysis, no neck pain, no rash, no sore throat, no sputum production and no vomiting   Chest pain:    Quality:  Aching   Severity:  Mild   Onset quality:  Gradual   Duration:  3 days   Timing:  Constant   Progression:  Unchanged   Chronicity:  New Cough:    Cough characteristics:  Non-productive   Severity:  Moderate   Onset quality:  Gradual   Cough duration: 2 weeks, worse in the last 3 days.   Timing:  Constant   Progression:  Worsening   Chronicity:  New Risk factors: no family hx of DVT, no hx of PE/DVT, no obesity, no prolonged immobilization and no recent surgery     Past Medical History  Diagnosis Date  . Tourette's syndrome   . Tic disorder   . Scoliosis   . Hemiatrophy of right leg   . Club foot     Right   Past Surgical History  Procedure Laterality Date  . Foot surgery Right 2002  . Eye muscle surgery Bilateral 1998 and 2002  . Femoral hernia repair      Done when he was an infant  . Harrington rod surgery  2003    For Scoliosis   Family History  Problem Relation Age of Onset  . Breast cancer Maternal Grandmother     Died in her mid 25's  . Prostate cancer Maternal Grandfather     Died in his 16's  . Hypertension     History  Substance Use Topics  . Smoking status: Never Smoker   . Smokeless tobacco: Never Used  . Alcohol Use: No    Review of Systems  Constitutional: Negative for fever and diaphoresis.  HENT: Negative for sore throat.   Respiratory: Positive for cough and shortness of breath. Negative for hemoptysis and sputum production.   Cardiovascular: Positive for chest pain.  Gastrointestinal: Negative for nausea, vomiting, abdominal pain and diarrhea.  Musculoskeletal: Negative for neck pain.  Skin: Negative for rash.  Neurological: Negative for dizziness, weakness, light-headedness, numbness and headaches.  Hematological: Negative for adenopathy. Does not bruise/bleed easily.  All other systems reviewed and are negative.     Allergies  Review of patient's allergies indicates no known allergies.  Home Medications   Prior to Admission medications   Medication Sig Start Date End Date Taking? Authorizing Provider  amphetamine-dextroamphetamine (ADDERALL) 10 MG tablet Take 5 mg by mouth daily with breakfast.   Yes Historical Provider, MD  cloNIDine (CATAPRES) 0.1 MG tablet TAKE 3 TABLETS BY MOUTH AT BEDTIME 03/11/14  Yes Elveria Rising, NP  Dexmethylphenidate HCl 35 MG CP24 Take 1 capsule every morning 03/11/14  Yes Elveria Risingina Goodpasture, NP   BP 129/110 mmHg  Pulse 109  Temp(Src) 97.2 F (36.2 C) (Oral)  Resp 32  Ht 4\' 8"  (1.422 m)  SpO2 100% Physical Exam  Constitutional: He is oriented to person, place, and time.  Small for age  HENT:  Head: Normocephalic and atraumatic.  Right Ear: External ear normal.  Left Ear: External ear normal.  Mouth/Throat: Oropharynx is clear and moist.  Eyes: Conjunctivae and EOM are normal. Pupils are equal, round, and reactive to light.  Neck:  Short neck, limited flexion and extension  Cardiovascular: Regular rhythm, normal heart sounds and  intact distal pulses.   Tachycardia  Pulmonary/Chest: He is in respiratory distress. He has no wheezes. He has no rales. He exhibits no tenderness.  Tachypnea, respiratory distress, decreased breath sounds bilaterally   Abdominal: Soft. Bowel sounds are normal. He exhibits no distension and no mass. There is no tenderness. There is no rebound and no guarding.  Musculoskeletal:  Atrophy of R leg  Neurological: He is alert and oriented to person, place, and time.  Skin: Skin is warm and dry.  Nursing note and vitals reviewed.   ED Course  INTUBATION Date/Time: 04/01/2014 2:17 AM Performed by: Jon GillsWEBB, Deantre Bourdon Authorized by: Jon GillsWEBB, Caelan Branden Consent: Verbal consent obtained. Risks and benefits: risks, benefits and alternatives were discussed Consent given by: parent Required items: required blood products, implants, devices, and special equipment available Patient identity confirmed: verbally with patient, arm band, provided demographic data and hospital-assigned identification number Time out: Immediately prior to procedure a "time out" was called to verify the correct patient, procedure, equipment, support staff and site/side marked as required. Indications: respiratory distress Intubation method: video-assisted Patient status: paralyzed (RSI) Preoxygenation: BVM Sedatives: etomidate Paralytic: succinylcholine Laryngoscope size: Glidescope 3. Tube size: 7.5 mm Tube type: cuffed Number of attempts: 2 Ventilation between attempts: BVM Cricoid pressure: no Cords visualized: yes Post-procedure assessment: chest rise and CO2 detector Breath sounds: equal and absent over the epigastrium Cuff inflated: yes ETT to lip: 23 cm Tube secured with: ETT holder Chest x-ray interpreted by me and radiologist. Chest x-ray findings: endotracheal tube in appropriate position Patient tolerance: Patient tolerated the procedure well with no immediate complications   (including critical care time) Labs  Review Labs Reviewed  CBC WITH DIFFERENTIAL/PLATELET - Abnormal; Notable for the following:    HCT 53.4 (*)    Monocytes Relative 15 (*)    Monocytes Absolute 1.2 (*)    All other components within normal limits  URINALYSIS, ROUTINE W REFLEX MICROSCOPIC - Abnormal; Notable for the following:    Hgb urine dipstick TRACE (*)    All other components within normal limits  BLOOD GAS, ARTERIAL - Abnormal; Notable for the following:    pH, Arterial 7.166 (*)    pCO2 arterial 86.1 (*)    pO2, Arterial 353.0 (*)    Bicarbonate 30.4 (*)    Acid-Base Excess 2.2 (*)    All other components within normal limits  GLUCOSE, CAPILLARY - Abnormal; Notable for the following:    Glucose-Capillary 134 (*)    All other components within normal limits  I-STAT ARTERIAL BLOOD GAS, ED - Abnormal; Notable for the following:    pH, Arterial 7.275 (*)    pCO2 arterial 74.4 (*)    pO2, Arterial 315.0 (*)    Bicarbonate 34.6 (*)    Acid-Base Excess 4.0 (*)    All other components within normal limits  I-STAT ARTERIAL BLOOD GAS, ED - Abnormal; Notable for the  following:    pH, Arterial 7.243 (*)    pCO2 arterial 79.5 (*)    pO2, Arterial 51.0 (*)    Bicarbonate 34.7 (*)    Acid-Base Excess 3.0 (*)    All other components within normal limits  CULTURE, BLOOD (ROUTINE X 2)  CULTURE, BLOOD (ROUTINE X 2)  MRSA PCR SCREENING  CULTURE, RESPIRATORY (NON-EXPECTORATED)  BASIC METABOLIC PANEL  CBC  CREATININE, SERUM  TRIGLYCERIDES  URINE MICROSCOPIC-ADD ON  BLOOD GAS, ARTERIAL  CBC  BASIC METABOLIC PANEL  MAGNESIUM  PHOSPHORUS  BLOOD GAS, ARTERIAL  INFLUENZA PANEL BY PCR (TYPE A & B, H1N1)  STREP PNEUMONIAE URINARY ANTIGEN  I-STAT CG4 LACTIC ACID, ED    Imaging Review Ct Angio Chest Pe W/cm &/or Wo Cm  03/31/2014   CLINICAL DATA:  Shortness of breath, hypoxia  EXAM: CT ANGIOGRAPHY CHEST WITH CONTRAST  TECHNIQUE: Multidetector CT imaging of the chest was performed using the standard protocol during  bolus administration of intravenous contrast. Multiplanar CT image reconstructions and MIPs were obtained to evaluate the vascular anatomy.  CONTRAST:  80mL OMNIPAQUE IOHEXOL 350 MG/ML SOLN  COMPARISON:  None.  FINDINGS: Evaluation is severely limited due to motion degradation as well as streak artifact from the patient's spinal fixation hardware.  Within that constraint, there is no evidence of pulmonary embolism.  Mediastinum/Nodes: Cardiomegaly.  No pericardial effusion.  No suspicious mediastinal, hilar, or axillary lymphadenopathy.  Lungs/Pleura: Evaluation of the lung parenchyma is constrained by respiratory motion.  Mild patchy opacity in the lingula/lateral left lower lobe and superior right lower lobe, likely atelectasis.  No pleural effusion or pneumothorax.  Upper abdomen: Visualized upper abdomen is unremarkable.  Musculoskeletal: Thoracolumbar scoliosis with spinal fixation rods. Multiple chronic rib deformities.  Review of the MIP images confirms the above findings.  IMPRESSION: Superior limited evaluation due to motion degradation.  No evidence of pulmonary embolism.  No evidence of acute cardiopulmonary disease.   Electronically Signed   By: Charline Bills M.D.   On: 03/31/2014 21:00   Dg Chest Port 1 View  03/31/2014   CLINICAL DATA:  Endotracheal tube and central line placements.  EXAM: PORTABLE CHEST - 1 VIEW  COMPARISON:  03/31/2014  FINDINGS: Postoperative changes with posterior fixation in the thoracolumbar spine. Fixation hardware limits visualization of the mediastinum. Endotracheal tube tip is visualized and appears to rest about 2.4 cm above the carinal. Low lung volumes. Cardiac enlargement. Suggestion of infiltration or atelectasis in the right mid lung. Left lung base is obscured. No pneumothorax. Multiple rib deformities.  IMPRESSION: Enteric tube tip measures 2.4 cm above the carina. Otherwise no change in appearance to the chest. Cardiac enlargement. Possible infiltration or  atelectasis in the right base.   Electronically Signed   By: Burman Nieves M.D.   On: 03/31/2014 23:11   Dg Chest Portable 1 View  03/31/2014   CLINICAL DATA:  Extreme shortness of breath  EXAM: PORTABLE CHEST - 1 VIEW  COMPARISON:  01/30/2013  FINDINGS: Low lung volumes.  Left lung base is obscured.  Cardiomegaly.  Multiple rib deformities, chronic.  Harrington spinal fixation rods.  IMPRESSION: Low lung volumes.  No evidence of acute cardiopulmonary disease.   Electronically Signed   By: Charline Bills M.D.   On: 03/31/2014 18:39   Dg Abd Portable 1v  04/01/2014   CLINICAL DATA:  OG tube placement  EXAM: PORTABLE ABDOMEN - 1 VIEW  COMPARISON:  None.  FINDINGS: Enteric tube in place, tip and side port below the  diaphragm in the stomach. There are no dilated bowel loops to suggest bowel obstruction. Small volume of colonic stool. A catheter projects over the pelvis. Extensive thoracolumbar fusion hardware is present.  IMPRESSION: Tip and side port of the enteric tube below the diaphragm in the stomach.   Electronically Signed   By: Rubye Oaks M.D.   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   Father states pt has been ill with cough and some SOB over the last 2 weeks, thought he just had a cold.  Over the last three days symptoms have worsened, and pt became acutely dyspneic at rest today. Pt came through triage with O2 sats of 60% on RA.  Pt placed on NRB with improvement to 100% sats.  Pt transitioned to BiPAP.  CXR did not show any acute disease per radiology read.  Initial blood gas demonstrated hypercarbic respiratory acidosis.  Other labs demonstrated  normal LA, normal BMP, Normal CBC.  CT PE study ordered, which did not demonstrate PE, PNA, or pulmonary edema.    Critical care consulted, given unknown source of patient's acute respiratory failure.  Pt initially tolerated BiPAP, but then worsened while on it.  Decision made to intubate pt made.  Pt was intubated with etomidate, succinylcholine, and propofol as above.  Post intubation CXR demonstrates good tube position.    Pt to be admitted to Critical Care for further evaluation and management.  Family updated and understands plan.   Jon Gills  Discussed pt with my attending Dr. Fayrene Fearing.    Jon Gills, MD 04/01/14 1610  Rolland Porter, MD 04/10/14 [phone]

## 2014-03-31 NOTE — ED Notes (Signed)
Admit Doctor at bedside.  

## 2014-03-31 NOTE — ED Notes (Signed)
EDP,  Resident,  at bedside talking to family member regarding intubation

## 2014-03-31 NOTE — ED Notes (Signed)
Respiratory, Resident, Nurse and Father at bedside attempting bipap.

## 2014-03-31 NOTE — ED Notes (Signed)
Pt transported to CT scan with BiPap and respiratory therapist Morrie Sheldonshley at bedside.

## 2014-03-31 NOTE — Progress Notes (Signed)
eLink Physician-Brief Progress Note Patient Name: Scot [name] DOB: 02/19/1990 MRN: [id number]   Date of Service  03/31/2014  HPI/Events of Note  Called by EDP for admission to the ICU for a patient who per description of the airway by EDP and from reviewing the scan will be an extremely difficult airway.  Patient has an extremely complicated PMH that was reviewed with EDP over the phone.  The patient has improved with use of BiPAP.  Patient has a fused neck per EDP.  He is microacnathic and will be an extremely difficult airway.  eICU Interventions  Given EDP's description, the fact that patient is now stable to transport per EDP, [facility] and the fact that difficult airway teams will be more readily available in [facility], for patient's safety due to how difficult his airway is, I would feel much safer [facility] where difficult [facility] teams will be more readily available.  [facility].        [name],WESAM 03/31/2014, 9:42 PM

## 2014-04-01 ENCOUNTER — Inpatient Hospital Stay (HOSPITAL_COMMUNITY): Payer: Medicaid Other

## 2014-04-01 DIAGNOSIS — J189 Pneumonia, unspecified organism: Secondary | ICD-10-CM

## 2014-04-01 DIAGNOSIS — J810 Acute pulmonary edema: Secondary | ICD-10-CM

## 2014-04-01 LAB — BLOOD GAS, ARTERIAL
Acid-Base Excess: 5 mmol/L — ABNORMAL HIGH (ref 0.0–2.0)
Bicarbonate: 29.6 mEq/L — ABNORMAL HIGH (ref 20.0–24.0)
DRAWN BY: 36496
FIO2: 0.5 %
LHR: 26 {breaths}/min
MECHVT: 320 mL
O2 SAT: 99 %
PEEP/CPAP: 5 cmH2O
Patient temperature: 98.6
TCO2: 31.1 mmol/L (ref 0–100)
pCO2 arterial: 48.3 mmHg — ABNORMAL HIGH (ref 35.0–45.0)
pH, Arterial: 7.404 (ref 7.350–7.450)
pO2, Arterial: 160 mmHg — ABNORMAL HIGH (ref 80.0–100.0)

## 2014-04-01 LAB — CBC
HCT: 51 % (ref 39.0–52.0)
HEMATOCRIT: 47.3 % (ref 39.0–52.0)
Hemoglobin: 15.9 g/dL (ref 13.0–17.0)
Hemoglobin: 14.7 g/dL (ref 13.0–17.0)
MCH: 29.6 pg (ref 26.0–34.0)
MCH: 28.8 pg (ref 26.0–34.0)
MCHC: 31.2 g/dL (ref 30.0–36.0)
MCHC: 31.1 g/dL (ref 30.0–36.0)
MCV: 95 fL (ref 78.0–100.0)
MCV: 92.7 fL (ref 78.0–100.0)
PLATELETS: 237 10*3/uL (ref 150–400)
Platelets: 214 10*3/uL (ref 150–400)
RBC: 5.37 MIL/uL (ref 4.22–5.81)
RBC: 5.1 MIL/uL (ref 4.22–5.81)
RDW: 13.9 % (ref 11.5–15.5)
RDW: 14 % (ref 11.5–15.5)
WBC: 8.9 10*3/uL (ref 4.0–10.5)
WBC: 8.7 10*3/uL (ref 4.0–10.5)

## 2014-04-01 LAB — URINALYSIS, ROUTINE W REFLEX MICROSCOPIC
BILIRUBIN URINE: NEGATIVE
Glucose, UA: NEGATIVE mg/dL
Ketones, ur: NEGATIVE mg/dL
Leukocytes, UA: NEGATIVE
Nitrite: NEGATIVE
PH: 5 (ref 5.0–8.0)
Protein, ur: NEGATIVE mg/dL
SPECIFIC GRAVITY, URINE: 1.015 (ref 1.005–1.030)
Urobilinogen, UA: 0.2 mg/dL (ref 0.0–1.0)

## 2014-04-01 LAB — MRSA PCR SCREENING: MRSA BY PCR: NEGATIVE

## 2014-04-01 LAB — BASIC METABOLIC PANEL
ANION GAP: 10 (ref 5–15)
BUN: 15 mg/dL (ref 6–23)
CALCIUM: 8.7 mg/dL (ref 8.4–10.5)
CO2: 27 mmol/L (ref 19–32)
CREATININE: 0.94 mg/dL (ref 0.50–1.35)
Chloride: 106 mmol/L (ref 96–112)
GFR calc non Af Amer: >90 mL/min (ref >90)
Glucose, Bld: 105 mg/dL — ABNORMAL HIGH (ref 70–99)
Potassium: 3.8 mmol/L (ref 3.5–5.1)
SODIUM: 143 mmol/L (ref 135–145)

## 2014-04-01 LAB — INFLUENZA PANEL BY PCR (TYPE A & B)
H1N1 flu by pcr: NOT DETECTED
INFLBPCR: NEGATIVE
Influenza A By PCR: NEGATIVE

## 2014-04-01 LAB — GLUCOSE, CAPILLARY
GLUCOSE-CAPILLARY: 115 mg/dL — AB (ref 70–99)
GLUCOSE-CAPILLARY: 134 mg/dL — AB (ref 70–99)
GLUCOSE-CAPILLARY: 89 mg/dL (ref 70–99)

## 2014-04-01 LAB — CREATININE, SERUM
Creatinine, Ser: 0.87 mg/dL (ref 0.50–1.35)
GFR calc Af Amer: >90 mL/min (ref >90)
GFR calc non Af Amer: >90 mL/min (ref >90)

## 2014-04-01 LAB — URINE MICROSCOPIC-ADD ON

## 2014-04-01 LAB — TRIGLYCERIDES: TRIGLYCERIDES: 29 mg/dL (ref <150)

## 2014-04-01 LAB — STREP PNEUMONIAE URINARY ANTIGEN: Strep Pneumo Urinary Antigen: NEGATIVE

## 2014-04-01 LAB — MAGNESIUM: Magnesium: 2 mg/dL (ref 1.5–2.5)

## 2014-04-01 LAB — PHOSPHORUS: Phosphorus: 3.1 mg/dL (ref 2.3–4.6)

## 2014-04-01 MED ORDER — MIDAZOLAM HCL 2 MG/2ML IJ SOLN
1.0000 mg | INTRAMUSCULAR | Status: DC | PRN
  Administered 2014-04-01 – 2014-04-09 (×27): 2 mg via INTRAVENOUS
  Filled 2014-04-01 (×29): qty 2

## 2014-04-01 MED ORDER — AMPHETAMINE-DEXTROAMPHETAMINE 10 MG PO TABS
5.0000 mg | ORAL_TABLET | Freq: Every day | ORAL | Status: DC
  Administered 2014-04-01 – 2014-04-05 (×5): 5 mg via ORAL
  Filled 2014-04-01 (×5): qty 1

## 2014-04-01 MED ORDER — VITAL AF 1.2 CAL PO LIQD
1000.0000 mL | ORAL | Status: DC
  Administered 2014-04-01 – 2014-04-07 (×7): 1000 mL
  Filled 2014-04-01 (×11): qty 1000

## 2014-04-01 MED ORDER — AZITHROMYCIN 500 MG IV SOLR
500.0000 mg | Freq: Every day | INTRAVENOUS | Status: AC
  Administered 2014-04-01 – 2014-04-05 (×6): 500 mg via INTRAVENOUS
  Filled 2014-04-01 (×6): qty 500

## 2014-04-01 MED ORDER — PRO-STAT SUGAR FREE PO LIQD
30.0000 mL | Freq: Two times a day (BID) | ORAL | Status: DC
  Administered 2014-04-01 – 2014-04-08 (×15): 30 mL
  Filled 2014-04-01 (×16): qty 30

## 2014-04-01 MED ORDER — MIDAZOLAM HCL 5 MG/ML IJ SOLN
1.0000 mg/h | INTRAMUSCULAR | Status: DC
  Administered 2014-04-01 (×2): 2 mg/h via INTRAVENOUS
  Filled 2014-04-01 (×5): qty 10

## 2014-04-01 MED ORDER — CEFTRIAXONE SODIUM IN DEXTROSE 20 MG/ML IV SOLN
1.0000 g | Freq: Every day | INTRAVENOUS | Status: DC
  Administered 2014-04-01 – 2014-04-04 (×5): 1 g via INTRAVENOUS
  Filled 2014-04-01 (×6): qty 50

## 2014-04-01 MED ORDER — POTASSIUM CHLORIDE 20 MEQ/15ML (10%) PO SOLN
40.0000 meq | Freq: Three times a day (TID) | ORAL | Status: AC
  Administered 2014-04-01 (×2): 40 meq
  Filled 2014-04-01 (×2): qty 30

## 2014-04-01 MED ORDER — FENTANYL CITRATE 0.05 MG/ML IJ SOLN
25.0000 ug | INTRAMUSCULAR | Status: DC | PRN
  Administered 2014-04-01 – 2014-04-06 (×3): 100 ug via INTRAVENOUS
  Administered 2014-04-08 (×2): 50 ug via INTRAVENOUS
  Filled 2014-04-01: qty 2

## 2014-04-01 MED ORDER — SODIUM CHLORIDE 0.9 % IV SOLN
0.0000 ug/h | INTRAVENOUS | Status: DC
  Administered 2014-04-01: 200 ug/h via INTRAVENOUS
  Administered 2014-04-01 – 2014-04-02 (×2): 50 ug/h via INTRAVENOUS
  Administered 2014-04-02: 200 ug/h via INTRAVENOUS
  Administered 2014-04-03: 150 ug/h via INTRAVENOUS
  Administered 2014-04-03 – 2014-04-08 (×9): 200 ug/h via INTRAVENOUS
  Filled 2014-04-01 (×14): qty 50

## 2014-04-01 MED ORDER — PROPOFOL 10 MG/ML IV EMUL
INTRAVENOUS | Status: AC
  Filled 2014-04-01: qty 100

## 2014-04-01 MED ORDER — VITAL HIGH PROTEIN PO LIQD
1000.0000 mL | ORAL | Status: DC
  Filled 2014-04-01 (×4): qty 1000

## 2014-04-01 MED ORDER — FUROSEMIDE 10 MG/ML IJ SOLN
20.0000 mg | Freq: Three times a day (TID) | INTRAMUSCULAR | Status: AC
  Administered 2014-04-01 (×2): 20 mg via INTRAVENOUS
  Filled 2014-04-01 (×2): qty 2

## 2014-04-01 NOTE — Progress Notes (Signed)
PULMONARY / CRITICAL CARE MEDICINE   Name: [name] MRN: [id number] DOB: 05/17/1990    ADMISSION DATE:  03/31/2014 CONSULTATION DATE:  04/01/2014  REFERRING MD :  EDP  CHIEF COMPLAINT:  SOB  INITIAL PRESENTATION:  24 y.o. M [facility] with [facility].  In ED, he was hypoxic with sats in 60's.  He was placed on BiPAP but repeat ABG 3 hours later showed progressive respiratory decline.  Given his hx of cervical fusion, micrognathia, prior trach, etc. (which all predispose him to difficult airway), he was intubated prior to ICU admission.  STUDIES:  CTA chest 2/24 >>> negative for PE, limited study due to motion.  SIGNIFICANT EVENTS: 2/24 - admit  SUBJECTIVE: no events overnight, remains sedated and intubated.  VITAL SIGNS: Temp:  [94 F (34.4 C)-97.8 F (36.6 C)] 97.8 F (36.6 C) (02/25 0741) Pulse Rate:  [61-123] 61 (02/25 0845) Resp:  [18-38] 26 (02/25 0800) BP: (95-175)/(54-134) 95/61 mmHg (02/25 0845) SpO2:  [63 %-100 %] 100 % (02/25 0845) FiO2 (%):  [40 %-100 %] 40 % (02/25 0845) Weight:  [68 kg (149 lb 14.6 oz)-68.7 kg (151 lb 7.3 oz)] 68 kg (149 lb 14.6 oz) (02/25 0451) HEMODYNAMICS:   VENTILATOR SETTINGS: Vent Mode:  [-] PRVC FiO2 (%):  [40 %-100 %] 40 % Set Rate:  [8 bmp-26 bmp] 26 bmp Vt Set:  [320 mL] 320 mL PEEP:  [5 cmH20] 5 cmH20 Plateau Pressure:  [22 cmH20-28 cmH20] 23 cmH20 INTAKE / OUTPUT: Intake/Output      02/24 0701 - 02/25 0700 02/25 0701 - 02/26 0700   I.V. (mL/kg) 1103.2 (16.2) 140.6 (2.1)   IV Piggyback 300    Total Intake(mL/kg) 1403.2 (20.6) 140.6 (2.1)   Urine (mL/kg/hr) 365 60 (0.2)   Emesis/NG output 400    Total Output 765 60   Net +638.2 +80.6          PHYSICAL EXAMINATION: General: Young male, in NAD. Neuro: Somnolent but awakens to physical stimuli. HEENT: /AT. PERRL, sclerae anicteric.  Micrognathia. Cardiovascular: RRR, no M/R/G.  Lungs: Respirations shallow and unlabored, decreased BS on L.  Otherwise CTA. Abdomen:  BS x 4, soft, NT/ND.  Musculoskeletal: R club foot, R hemiatrophy. Skin: Intact, warm, no rashes.  LABS:  CBC  Recent Labs Lab 03/31/14 1815 03/31/14 2235 04/01/14 0236  WBC 8.3 8.9 8.7  HGB 16.6 15.9 14.7  HCT 53.4* 51.0 47.3  PLT 227 214 237   Coag's No results for input(s): APTT, INR in the last 168 hours. BMET  Recent Labs Lab 03/31/14 1815 03/31/14 2235 04/01/14 0236  NA 142  --  143  K 4.8  --  3.8  CL 101  --  106  CO2 30  --  27  BUN 16  --  15  CREATININE 0.95 0.87 0.94  GLUCOSE 97  --  105*   Electrolytes  Recent Labs Lab 03/31/14 1815 04/01/14 0236  CALCIUM 9.2 8.7  MG  --  2.0  PHOS  --  3.1   Sepsis Markers  Recent Labs Lab 03/31/14 1837  LATICACIDVEN 1.27   ABG  Recent Labs Lab 03/31/14 2211 03/31/14 2345 04/01/14 0442  PHART 7.243* 7.166* 7.404  PCO2ART 79.5* 86.1* 48.3*  PO2ART 51.0* 353.0* 160.0*   Liver Enzymes No results for input(s): AST, ALT, ALKPHOS, BILITOT, ALBUMIN in the last 168 hours. Cardiac Enzymes No results for input(s): TROPONINI, PROBNP in the last 168 hours. Glucose  Recent Labs Lab 03/31/14 2348 04/01/14 0421  GLUCAP 134* 115*    Imaging Ct Angio Chest Pe W/cm &/or Wo Cm  03/31/2014   CLINICAL DATA:  Shortness of breath, hypoxia  EXAM: CT ANGIOGRAPHY CHEST WITH CONTRAST  TECHNIQUE: Multidetector CT imaging of the chest was performed using the standard protocol during bolus administration of intravenous contrast. Multiplanar CT image reconstructions and MIPs were obtained to evaluate the vascular anatomy.  CONTRAST:  80mL OMNIPAQUE IOHEXOL 350 MG/ML SOLN  COMPARISON:  None.  FINDINGS: Evaluation is severely limited due to motion degradation as well as streak artifact from the patient's spinal fixation hardware.  Within that constraint, there is no evidence of pulmonary embolism.  Mediastinum/Nodes: Cardiomegaly.  No pericardial effusion.  No suspicious mediastinal, hilar, or axillary lymphadenopathy.   Lungs/Pleura: Evaluation of the lung parenchyma is constrained by respiratory motion.  Mild patchy opacity in the lingula/lateral left lower lobe and superior right lower lobe, likely atelectasis.  No pleural effusion or pneumothorax.  Upper abdomen: Visualized upper abdomen is unremarkable.  Musculoskeletal: Thoracolumbar scoliosis with spinal fixation rods. Multiple chronic rib deformities.  Review of the MIP images confirms the above findings.  IMPRESSION: Superior limited evaluation due to motion degradation.  No evidence of pulmonary embolism.  No evidence of acute cardiopulmonary disease.   Electronically Signed   By: Charline Bills M.D.   On: 03/31/2014 21:00   Dg Chest Port 1 View  04/01/2014   CLINICAL DATA:  Assess endotracheal tube position  EXAM: PORTABLE CHEST - 1 VIEW  COMPARISON:  Portable chest x-ray of March 31, 2014  FINDINGS: The endotracheal tube tip is obscured by the superimposition of the spinal hardware. The tip may lie at the level of the inferior margin of the clavicular heads which would place it 4.3 cm above thecarina. The esophagogastric tube tip projects below the inferior margin of the image. There are cardiac monitoring leads present. The lung volumes remain low. The retrocardiac region remains dense.  IMPRESSION: As best as can be determined the tip of the endotracheal tube lies approximately 4.3 cm above the crotch of the carina. There has not been significant interval change in the appearance of the chest since yesterday's study. Persistent bibasilar atelectasis or pneumonia.   Electronically Signed   By: David  [place]   On: 04/01/2014 07:35   Dg Chest Port 1 View  03/31/2014   CLINICAL DATA:  Endotracheal tube and central line placements.  EXAM: PORTABLE CHEST - 1 VIEW  COMPARISON:  03/31/2014  FINDINGS: Postoperative changes with posterior fixation in the thoracolumbar spine. Fixation hardware limits visualization of the mediastinum. Endotracheal tube tip is  visualized and appears to rest about 2.4 cm above the carinal. Low lung volumes. Cardiac enlargement. Suggestion of infiltration or atelectasis in the right mid lung. Left lung base is obscured. No pneumothorax. Multiple rib deformities.  IMPRESSION: Enteric tube tip measures 2.4 cm above the carina. Otherwise no change in appearance to the chest. Cardiac enlargement. Possible infiltration or atelectasis in the right base.   Electronically Signed   By: Burman Nieves M.D.   On: 03/31/2014 23:11   Dg Chest Portable 1 View  03/31/2014   CLINICAL DATA:  Extreme shortness of breath  EXAM: PORTABLE CHEST - 1 VIEW  COMPARISON:  01/30/2013  FINDINGS: Low lung volumes.  Left lung base is obscured.  Cardiomegaly.  Multiple rib deformities, chronic.  Harrington spinal fixation rods.  IMPRESSION: Low lung volumes.  No evidence of acute cardiopulmonary disease.   Electronically Signed   By: Charline Bills  M.D.   On: 03/31/2014 18:39   Dg Abd Portable 1v  04/01/2014   CLINICAL DATA:  OG tube placement  EXAM: PORTABLE ABDOMEN - 1 VIEW  COMPARISON:  None.  FINDINGS: Enteric tube in place, tip and side port below the diaphragm in the stomach. There are no dilated bowel loops to suggest bowel obstruction. Small volume of colonic stool. A catheter projects over the pelvis. Extensive thoracolumbar fusion hardware is present.  IMPRESSION: Tip and side port of the enteric tube below the diaphragm in the stomach.   Electronically Signed   By: Rubye Oaks M.D.   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  Wean as able. VAP bundle. SBT in AM if able. ABG and CXR in AM. Diureses as below.  CARDIOVASCULAR A:  Marginal BP with propofol. P:  Monitor hemodynamics. D/C  propofol. Monitor BP.  RENAL A:   No acute issues P:   KVO IVF. BMP in AM. Lasix 20 mg IV q8 x2 doses. Replace electrolytes as indicated.  GASTROINTESTINAL A:   GI prophylaxis Nutrition P:   SUP: Pantoprazole. TF as per nutrition.  HEMATOLOGIC A:   VTE Prophylaxis P:  SCD's / Heparin. CBC in AM.  INFECTIOUS A:   No indication of infection P:   Monitor clinically.  ENDOCRINE A:   No known issues P:   SSI if glucose consistently > 180.  NEUROLOGIC A:   Acute metabolic encephalopathy Hx Insomnia, Tourette's syndrome, tic disorder, scoliosis s/p surgical fixation, right hemiatrophy, right club foot P:   Sedation:  D/C Propofol for versed / Fentanyl drip. RASS goal: 0 to -1. Daily WUA. Hold outpatient clonidine and dexmethylphenidate Restart adderall 5 mg PO daily.  Family updated: Father updated bedside  Interdisciplinary Family Meeting v Palliative Care Meeting:  Due by: 3/1.  The patient is critically ill with multiple organ systems failure and requires high complexity decision making for assessment and support, frequent evaluation and titration of therapies, application of advanced monitoring technologies and extensive interpretation of multiple databases.   Critical Care Time devoted to patient care services described in this note is  35  Minutes. This time reflects time of care of this signee Dr Koren Bound. This critical care time does not reflect procedure time, or teaching time or supervisory time of PA/NP/Med student/Med Resident etc but could involve care discussion time.  Alyson Reedy, M.D. [facility] Pulmonary/Critical Care Medicine. Pager: [phone]. After hours pager: [phone].

## 2014-04-01 NOTE — Progress Notes (Signed)
UR Completed.  [phone]  

## 2014-04-01 NOTE — Progress Notes (Signed)
eLink Physician-Brief Progress Note Patient Name: [name] DOB: 04/07/1990 MRN: [id number]   Date of Service  04/01/2014  HPI/Events of Note  Respiratory acidosis on first ABG post intubation  eICU Interventions  Vent settings adjusted     Intervention Category Major Interventions: Acid-Base disturbance - evaluation and management;Respiratory failure - evaluation and management  Billy FischerDavid Simonds 04/01/2014, 12:01 AM

## 2014-04-01 NOTE — Progress Notes (Signed)
Patient ID: [name], male   DOB: 05/14/1990, 24 y.o.   MRN: [id number]   Extreme agitation not controlled with max dose of propofol and intermittent fentanyl  Fentanyl infusion ordered  Billy Fischeravid Shevy Yaney, MD ; [facility] [phone].  After 5:30 PM or weekends, call [phone]

## 2014-04-01 NOTE — Progress Notes (Signed)
INITIAL NUTRITION ASSESSMENT  DOCUMENTATION CODES Per approved criteria  -Not Applicable   INTERVENTION: Initiate TF via OGT with Vital AF at 25 ml/hr and Prostat 30 ml BID on day 1; on day 2, provide Prostat 30 ml BID and increase to goal rate of 30 ml/hr (720 ml per day) to provide 1064 kcals, 84 grams of protein, 584 ml free water daily.   Above TF and propofol provides 1608 kcal (100% of estimated needs)  NUTRITION DIAGNOSIS: Inadequate oral intake related to inability to eat as evidenced by NPO status.   Goal: Pt to meet >/= 90% of estimated energy requirements  Monitor:  Tube feed tolerance, weight, labs  Reason for Assessment: C/s for enteral/tube feeding initiation and management  24 y.o. male  Admitting Dx: SOB  ASSESSMENT:  24 [facility]. He was intubated prior to ICU admission. PMH of Tourette's, Tic disorder, Scoliosis, hemiatrophy of right leg, and Club foot.   Labs and medications reviewed. Pt is currently intubated on ventilator support. Minute Ventilation: 8.1 L/min Temperature: 36.7 Propofol: 20.6 ml/hr (544 kcal)  Current needs difficult to assess due to scoliosis and hemiatrophy of right foot.  NFPE did not show subcutaneous body fat or muscle mass depletion. Pt appeared well developed.   Height: Ht Readings from Last 1 Encounters:  03/31/14 4\' 10"  (1.473 m)    Weight: Wt Readings from Last 1 Encounters:  04/01/14 149 lb 14.6 oz (68 kg)    Ideal Body Weight: 94 lb (42.7 kg)  % Ideal Body Weight: 158%  Wt Readings from Last 10 Encounters:  04/01/14 149 lb 14.6 oz (68 kg)  10/16/13 144 lb 12.8 oz (65.681 kg)  03/03/13 124 lb 12.8 oz (56.609 kg)  01/30/13 141 lb 1.5 oz (64 kg)  10/07/12 141 lb (63.957 kg)    Usual Body Weight: unknown  % Usual Body Weight: -  BMI:  Body mass index is 31.34 kg/(m^2).   Estimated Nutritional Needs: Kcal: 1609 Protein: 82-95 grams Fluid: >/= 1.5L daily  Skin: intact  Diet  Order: Diet NPO time specified  EDUCATION NEEDS: -No education needs identified at this time   Intake/Output Summary (Last 24 hours) at 04/01/14 1120 Last data filed at 04/01/14 0800  Gross per 24 hour  Intake 1543.79 ml  Output    825 ml  Net 718.79 ml    Last BM: not recorded  Labs:   Recent Labs Lab 03/31/14 1815 03/31/14 2235 04/01/14 0236  NA 142  --  143  K 4.8  --  3.8  CL 101  --  106  CO2 30  --  27  BUN 16  --  15  CREATININE 0.95 0.87 0.94  CALCIUM 9.2  --  8.7  MG  --   --  2.0  PHOS  --   --  3.1  GLUCOSE 97  --  105*    CBG (last 3)   Recent Labs  03/31/14 2348 04/01/14 0421  GLUCAP 134* 115*    Scheduled Meds: . amphetamine-dextroamphetamine  5 mg Oral Q breakfast  . antiseptic oral rinse  7 mL Mouth Rinse QID  . azithromycin  500 mg Intravenous QHS  . cefTRIAXone (ROCEPHIN)  IV  1 g Intravenous QHS  . chlorhexidine  15 mL Mouth Rinse BID  . furosemide  20 mg Intravenous Q8H  . heparin  5,000 Units Subcutaneous 3 times per day  . pantoprazole (PROTONIX) IV  40 mg Intravenous QHS  .  potassium chloride  40 mEq Per Tube TID    Continuous Infusions: . fentaNYL infusion INTRAVENOUS 200 mcg/hr (04/01/14 0800)    Past Medical History  Diagnosis Date  . Tourette's syndrome   . Tic disorder   . Scoliosis   . Hemiatrophy of right leg   . Club foot     Right    Past Surgical History  Procedure Laterality Date  . Foot surgery Right 2002  . Eye muscle surgery Bilateral 1998 and 2002  . Femoral hernia repair      Done when he was an infant  . Harrington rod surgery  2003    For Scoliosis    Cristela Felt, MS Dietetic Intern Pager: [phone]

## 2014-04-02 ENCOUNTER — Inpatient Hospital Stay (HOSPITAL_COMMUNITY): Payer: Medicaid Other

## 2014-04-02 DIAGNOSIS — I959 Hypotension, unspecified: Secondary | ICD-10-CM

## 2014-04-02 LAB — GLUCOSE, CAPILLARY
GLUCOSE-CAPILLARY: 112 mg/dL — AB (ref 70–99)
GLUCOSE-CAPILLARY: 159 mg/dL — AB (ref 70–99)
GLUCOSE-CAPILLARY: 99 mg/dL (ref 70–99)
Glucose-Capillary: 106 mg/dL — ABNORMAL HIGH (ref 70–99)
Glucose-Capillary: 140 mg/dL — ABNORMAL HIGH (ref 70–99)
Glucose-Capillary: 121 mg/dL — ABNORMAL HIGH (ref 70–99)

## 2014-04-02 LAB — BLOOD GAS, ARTERIAL
ACID-BASE EXCESS: 5.2 mmol/L — AB (ref 0.0–2.0)
BICARBONATE: 29 meq/L — AB (ref 20.0–24.0)
Drawn by: 252031
FIO2: 0.4 %
O2 SAT: 98.2 %
PATIENT TEMPERATURE: 98.6
PEEP: 5 cmH2O
PH ART: 7.464 — AB (ref 7.350–7.450)
RATE: 26 resp/min
TCO2: 30.3 mmol/L (ref 0–100)
VT: 320 mL
pCO2 arterial: 40.9 mmHg (ref 35.0–45.0)
pO2, Arterial: 115 mmHg — ABNORMAL HIGH (ref 80.0–100.0)

## 2014-04-02 LAB — CBC
HCT: 49.1 % (ref 39.0–52.0)
HEMOGLOBIN: 15.3 g/dL (ref 13.0–17.0)
MCH: 28.4 pg (ref 26.0–34.0)
MCHC: 31.2 g/dL (ref 30.0–36.0)
MCV: 91.3 fL (ref 78.0–100.0)
PLATELETS: 254 10*3/uL (ref 150–400)
RBC: 5.38 MIL/uL (ref 4.22–5.81)
RDW: 14.5 % (ref 11.5–15.5)
WBC: 9.4 10*3/uL (ref 4.0–10.5)

## 2014-04-02 LAB — MAGNESIUM: Magnesium: 2.3 mg/dL (ref 1.5–2.5)

## 2014-04-02 LAB — BASIC METABOLIC PANEL
ANION GAP: 9 (ref 5–15)
BUN: 17 mg/dL (ref 6–23)
CO2: 28 mmol/L (ref 19–32)
Calcium: 8.9 mg/dL (ref 8.4–10.5)
Chloride: 106 mmol/L (ref 96–112)
Creatinine, Ser: 1.16 mg/dL (ref 0.50–1.35)
GFR calc Af Amer: >90 mL/min (ref >90)
GFR calc non Af Amer: 88 mL/min — ABNORMAL LOW (ref >90)
Glucose, Bld: 102 mg/dL — ABNORMAL HIGH (ref 70–99)
POTASSIUM: 3.8 mmol/L (ref 3.5–5.1)
Sodium: 143 mmol/L (ref 135–145)

## 2014-04-02 LAB — PHOSPHORUS: PHOSPHORUS: 2.8 mg/dL (ref 2.3–4.6)

## 2014-04-02 MED ORDER — FUROSEMIDE 10 MG/ML IJ SOLN
20.0000 mg | Freq: Three times a day (TID) | INTRAMUSCULAR | Status: AC
  Administered 2014-04-02 (×2): 20 mg via INTRAVENOUS
  Filled 2014-04-02 (×2): qty 2

## 2014-04-02 MED ORDER — POTASSIUM CHLORIDE 20 MEQ/15ML (10%) PO SOLN
40.0000 meq | Freq: Three times a day (TID) | ORAL | Status: AC
  Administered 2014-04-02 (×2): 40 meq
  Filled 2014-04-02 (×3): qty 30

## 2014-04-02 MED ORDER — DEXMEDETOMIDINE HCL IN NACL 200 MCG/50ML IV SOLN
0.4000 ug/kg/h | INTRAVENOUS | Status: DC
Start: 2014-04-02 — End: 2014-04-03
  Administered 2014-04-02: 0.6 ug/kg/h via INTRAVENOUS
  Administered 2014-04-02: 0.4 ug/kg/h via INTRAVENOUS
  Administered 2014-04-03: 1.2 ug/kg/h via INTRAVENOUS
  Administered 2014-04-03: 0.6 ug/kg/h via INTRAVENOUS
  Administered 2014-04-03: 0.4 ug/kg/h via INTRAVENOUS
  Filled 2014-04-02 (×6): qty 50

## 2014-04-02 MED ORDER — PANTOPRAZOLE SODIUM 40 MG PO PACK
40.0000 mg | PACK | Freq: Every day | ORAL | Status: DC
  Administered 2014-04-02 – 2014-04-21 (×12): 40 mg
  Filled 2014-04-02 (×21): qty 20

## 2014-04-02 NOTE — Progress Notes (Signed)
Placed patient on PSV per MD.  Patient titrated to 20ps, 5 peep per his comfort level and Vt.  VTR 368, Ve 5.0 RR 14.  RN is aware and patient is tolerating at this time, although slightly agitated.  Will continue on PSV as patient tolerates.

## 2014-04-02 NOTE — Progress Notes (Signed)
   04/02/14 1640  Vent Select  Invasive or Noninvasive Invasive  Adult Vent Y  Airway 7.5 mm  Placement Date/Time: 03/31/14 2256   Difficult airway due to:: Difficulty was anticipated;Difficult airway - due to reduced neck mobility  Grade View: Grade 2  Placed By: ED Physician  Airway Device: Endotracheal Tube  Laryngoscope Blade: MAC;3  ETT T...  Secured at (cm) 21 cm  Measured From Lips  Secured Location Left  Secured By Brewing technologistCommercial Tube Holder  Tube Holder Repositioned Yes  Adult Ventilator Settings  Vent Type Servo i  Humidity HME  Vent Mode PRVC  Vt Set 320 mL  Set Rate 18 bmp  FiO2 (%) 40 %  I Time 0.8 Sec(s)  PEEP 5 cmH20  Adult Ventilator Measurements  Peak Airway Pressure 20 L/min  Mean Airway Pressure 9 cmH20  Resp Rate Spontaneous 18 br/min  Resp Rate Total 19 br/min  Exhaled Vt 347 mL  Measured Ve 6.4 mL  HOB> 30 Degrees Y  Adult Ventilator Alarms  Alarms On Y  Ve High Alarm 15 L/min  Ve Low Alarm 4 L/min  Resp Rate High Alarm 35 br/min  Resp Rate Low Alarm 8  PEEP Low Alarm 4 cmH2O  Press High Alarm 45 cmH2O  T Apnea 20 sec(s)  Placed patient back on PRVC due to agitation

## 2014-04-02 NOTE — Progress Notes (Signed)
   04/02/14 1559  Vent Select  Invasive or Noninvasive Invasive  Adult Vent Y  Airway 7.5 mm  Placement Date/Time: 03/31/14 2256   Difficult airway due to:: Difficulty was anticipated;Difficult airway - due to reduced neck mobility  Grade View: Grade 2  Placed By: ED Physician  Airway Device: Endotracheal Tube  Laryngoscope Blade: MAC;3  ETT T...  Secured at (cm) 21 cm  Measured From Lips  Secured Location Left  Secured By Brewing technologistCommercial Tube Holder  Tube Holder Repositioned Yes  Adult Ventilator Settings  Vent Type Servo i  Humidity HME  Vent Mode PSV  FiO2 (%) 40 %  Pressure Support 15 cmH20  PEEP 5 cmH20  Adult Ventilator Alarms  Alarms On Y  Ve High Alarm 15 L/min  Ve Low Alarm 4 L/min  Resp Rate High Alarm 35 br/min  Resp Rate Low Alarm 8  PEEP Low Alarm 4 cmH2O  Press High Alarm 45 cmH2O  T Apnea 20 sec(s)  Placed patient back on pressure support.  Patient is more awake and tolerating well.

## 2014-04-02 NOTE — Progress Notes (Signed)
PULMONARY / CRITICAL CARE MEDICINE   Name: [name] MRN: [id number] DOB: 1991/02/01    ADMISSION DATE:  03/31/2014 CONSULTATION DATE:  04/02/2014  REFERRING MD :  EDP  CHIEF COMPLAINT:  SOB  INITIAL PRESENTATION:  24 y.o. M brought to [facility] ED 2/24 with SOB.  In ED, he was hypoxic with sats in 60's.  He was placed on BiPAP but repeat ABG 3 hours later showed progressive respiratory decline.  Given his hx of cervical fusion, micrognathia, prior trach, etc. (which all predispose him to difficult airway), he was intubated prior to ICU admission.  STUDIES:  CTA chest 2/24 >>> negative for PE, limited study due to motion.  SIGNIFICANT EVENTS: 2/24 - admit  SUBJECTIVE: no events overnight, remains sedated and intubated.  VITAL SIGNS: Temp:  [98.1 F (36.7 C)-100 F (37.8 C)] 100 F (37.8 C) (02/26 0839) Pulse Rate:  [56-114] 96 (02/26 1000) Resp:  [23-28] 26 (02/26 1000) BP: (88-131)/(46-77) 101/56 mmHg (02/26 1000) SpO2:  [94 %-100 %] 98 % (02/26 1000) FiO2 (%):  [40 %] 40 % (02/26 0800) Weight:  [66.9 kg (147 lb 7.8 oz)] 66.9 kg (147 lb 7.8 oz) (02/26 0409) HEMODYNAMICS:   VENTILATOR SETTINGS: Vent Mode:  [-] PRVC FiO2 (%):  [40 %] 40 % Set Rate:  [26 bmp] 26 bmp Vt Set:  [320 mL] 320 mL PEEP:  [5 cmH20] 5 cmH20 Plateau Pressure:  [20 cmH20-25 cmH20] 21 cmH20 INTAKE / OUTPUT: Intake/Output      02/25 0701 - 02/26 0700 02/26 0701 - 02/27 0700   I.V. (mL/kg) 621.1 (9.3) 72 (1.1)   NG/GT 265 90   IV Piggyback 300    Total Intake(mL/kg) 1186.1 (17.7) 162 (2.4)   Urine (mL/kg/hr) 2295 (1.4) 100 (0.4)   Emesis/NG output     Total Output 2295 100   Net -1108.9 +62          PHYSICAL EXAMINATION: General: Young male, in NAD. Neuro: Somnolent but awakens to physical stimuli. HEENT: [place]/AT. PERRL, sclerae anicteric.  Micrognathia. Cardiovascular: RRR, no M/R/G.  Lungs: Respirations shallow and unlabored, decreased BS on L.  Otherwise CTA. Abdomen: BS x 4, soft, NT/ND.   Musculoskeletal: R club foot, R hemiatrophy. Skin: Intact, warm, no rashes.  LABS:  CBC  Recent Labs Lab 03/31/14 2235 04/01/14 0236 04/02/14 0240  WBC 8.9 8.7 9.4  HGB 15.9 14.7 15.3  HCT 51.0 47.3 49.1  PLT 214 237 254   Coag's No results for input(s): APTT, INR in the last 168 hours. BMET  Recent Labs Lab 03/31/14 1815 03/31/14 2235 04/01/14 0236 04/02/14 0240  NA 142  --  143 143  K 4.8  --  3.8 3.8  CL 101  --  106 106  CO2 30  --  27 28  BUN 16  --  15 17  CREATININE 0.95 0.87 0.94 1.16  GLUCOSE 97  --  105* 102*   Electrolytes  Recent Labs Lab 03/31/14 1815 04/01/14 0236 04/02/14 0240  CALCIUM 9.2 8.7 8.9  MG  --  2.0 2.3  PHOS  --  3.1 2.8   Sepsis Markers  Recent Labs Lab 03/31/14 1837  LATICACIDVEN 1.27   ABG  Recent Labs Lab 03/31/14 2345 04/01/14 0442 04/02/14 0313  PHART 7.166* 7.404 7.464*  PCO2ART 86.1* 48.3* 40.9  PO2ART 353.0* 160.0* 115.0*   Liver Enzymes No results for input(s): AST, ALT, ALKPHOS, BILITOT, ALBUMIN in the last 168 hours. Cardiac Enzymes No results for input(s): TROPONINI, PROBNP in the  last 168 hours. Glucose  Recent Labs Lab 03/31/14 2348 04/01/14 0421 04/01/14 1952 04/02/14 0009 04/02/14 0352 04/02/14 0838  GLUCAP 134* 115* 89 159* 99 106*    Imaging Ct Angio Chest Pe W/cm &/or Wo Cm  03/31/2014   CLINICAL DATA:  Shortness of breath, hypoxia  EXAM: CT ANGIOGRAPHY CHEST WITH CONTRAST  TECHNIQUE: Multidetector CT imaging of the chest was performed using the standard protocol during bolus administration of intravenous contrast. Multiplanar CT image reconstructions and MIPs were obtained to evaluate the vascular anatomy.  CONTRAST:  80mL OMNIPAQUE IOHEXOL 350 MG/ML SOLN  COMPARISON:  None.  FINDINGS: Evaluation is severely limited due to motion degradation as well as streak artifact from the patient's spinal fixation hardware.  Within that constraint, there is no evidence of pulmonary embolism.   Mediastinum/Nodes: Cardiomegaly.  No pericardial effusion.  No suspicious mediastinal, hilar, or axillary lymphadenopathy.  Lungs/Pleura: Evaluation of the lung parenchyma is constrained by respiratory motion.  Mild patchy opacity in the lingula/lateral left lower lobe and superior right lower lobe, likely atelectasis.  No pleural effusion or pneumothorax.  Upper abdomen: Visualized upper abdomen is unremarkable.  Musculoskeletal: Thoracolumbar scoliosis with spinal fixation rods. Multiple chronic rib deformities.  Review of the MIP images confirms the above findings.  IMPRESSION: Superior limited evaluation due to motion degradation.  No evidence of pulmonary embolism.  No evidence of acute cardiopulmonary disease.   Electronically Signed   By: Charline BillsSriyesh  Krishnan M.D.   On: 03/31/2014 21:00   Dg Chest Port 1 View  04/02/2014   CLINICAL DATA:  Hypoxia  EXAM: PORTABLE CHEST - 1 VIEW  COMPARISON:  April 01, 2014  FINDINGS: The tip of the endotracheal tube is difficult to confirm given the overlying metallic fixation in the thoracic spine. The endotracheal tube does not extend to the level of the carina and is probably not significantly changed compared to 1 day prior. Nasogastric tube extends below the diaphragm. No pneumothorax. There is stable cardiomegaly. There is no appreciable edema or consolidation. The pulmonary vascularity is normal. There is evidence of old rib trauma on the right.  IMPRESSION: The tip of the endotracheal tube is difficult to discern due to the overlying metallic fixation in the thoracic spine. The tip is above the carina. Nasogastric tube tip and side port below the diaphragm. No pneumothorax. Stable cardiomegaly. No edema or consolidation.   Electronically Signed   By: Bretta BangWilliam  Woodruff III M.D.   On: 04/02/2014 07:13   Dg Chest Port 1 View  04/01/2014   CLINICAL DATA:  Assess endotracheal tube position  EXAM: PORTABLE CHEST - 1 VIEW  COMPARISON:  Portable chest x-ray of March 31, 2014  FINDINGS: The endotracheal tube tip is obscured by the superimposition of the spinal hardware. The tip may lie at the level of the inferior margin of the clavicular heads which would place it 4.3 cm above thecarina. The esophagogastric tube tip projects below the inferior margin of the image. There are cardiac monitoring leads present. The lung volumes remain low. The retrocardiac region remains dense.  IMPRESSION: As best as can be determined the tip of the endotracheal tube lies approximately 4.3 cm above the crotch of the carina. There has not been significant interval change in the appearance of the chest since yesterday's study. Persistent bibasilar atelectasis or pneumonia.   Electronically Signed   By: David  SwazilandJordan   On: 04/01/2014 07:35   Dg Chest Port 1 View  03/31/2014   CLINICAL DATA:  Endotracheal  tube and central line placements.  EXAM: PORTABLE CHEST - 1 VIEW  COMPARISON:  03/31/2014  FINDINGS: Postoperative changes with posterior fixation in the thoracolumbar spine. Fixation hardware limits visualization of the mediastinum. Endotracheal tube tip is visualized and appears to rest about 2.4 cm above the carinal. Low lung volumes. Cardiac enlargement. Suggestion of infiltration or atelectasis in the right mid lung. Left lung base is obscured. No pneumothorax. Multiple rib deformities.  IMPRESSION: Enteric tube tip measures 2.4 cm above the carina. Otherwise no change in appearance to the chest. Cardiac enlargement. Possible infiltration or atelectasis in the right base.   Electronically Signed   By: Burman Nieves M.D.   On: 03/31/2014 23:11   Dg Chest Portable 1 View  03/31/2014   CLINICAL DATA:  Extreme shortness of breath  EXAM: PORTABLE CHEST - 1 VIEW  COMPARISON:  01/30/2013  FINDINGS: Low lung volumes.  Left lung base is obscured.  Cardiomegaly.  Multiple rib deformities, chronic.  Harrington spinal fixation rods.  IMPRESSION: Low lung volumes.  No evidence of acute  cardiopulmonary disease.   Electronically Signed   By: Charline Bills M.D.   On: 03/31/2014 18:39   Dg Abd Portable 1v  04/01/2014   CLINICAL DATA:  OG tube placement  EXAM: PORTABLE ABDOMEN - 1 VIEW  COMPARISON:  None.  FINDINGS: Enteric tube in place, tip and side port below the diaphragm in the stomach. There are no dilated bowel loops to suggest bowel obstruction. Small volume of colonic stool. A catheter projects over the pelvis. Extensive thoracolumbar fusion hardware is present.  IMPRESSION: Tip and side port of the enteric tube below the diaphragm in the stomach.   Electronically Signed   By: Rubye Oaks M.D.   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 VAP bundle. PS trials now and SBT in AM, extubation only when completely ready. ABG and CXR in AM. Diureses as below.  CARDIOVASCULAR A:  Marginal BP with propofol. P:  Monitor hemodynamics. Monitor BP.  RENAL A:   No acute issues P:   KVO IVF. BMP in AM. Lasix 20 mg IV q8 x2 doses. Replace electrolytes as indicated.  GASTROINTESTINAL A:   GI prophylaxis Nutrition P:   SUP: Pantoprazole. TF as per nutrition.  HEMATOLOGIC A:   VTE Prophylaxis P:  SCD's / Heparin. CBC in AM.  INFECTIOUS A:   No indication of infection P:   Monitor clinically.  ENDOCRINE A:   No known issues P:   SSI if glucose consistently > 180.  NEUROLOGIC A:   Acute metabolic encephalopathy Hx Insomnia, Tourette's syndrome, tic disorder, scoliosis s/p surgical fixation, right hemiatrophy, right club foot P:   Versed/fentanyl drips as ordered.  Decrease dose for weaning trials. RASS goal: 0 to -1. Daily WUA. Hold outpatient clonidine and dexmethylphenidate Continue adderall 5 mg PO  daily.  Family updated: No family bedside.  Interdisciplinary Family Meeting v Palliative Care Meeting:  Due by: 3/1.  Goal for today is to continue gentle diureses, will begin PS trials assuming mental status allows, SBT in AM, if not clear that extubation will be successful would rather trach and perform a slow wean rather than extubate given difficulty of airway.  The patient is critically ill with multiple organ systems failure and requires  high complexity decision making for assessment and support, frequent evaluation and titration of therapies, application of advanced monitoring technologies and extensive interpretation of multiple databases.   Critical Care Time devoted to patient care services described in this note is  35  Minutes. This time reflects time of care of this signee Dr Jennet Maduro. This critical care time does not reflect procedure time, or teaching time or supervisory time of PA/NP/Med student/Med Resident etc but could involve care discussion time.  Rush Farmer, M.D. [facility] Pulmonary/Critical Care Medicine. Pager: [phone]. After hours pager: [phone].

## 2014-04-02 NOTE — Progress Notes (Signed)
   04/02/14 1046  Adult Ventilator Settings  Set Rate 18 bmp  Decreased rate from 26 to 18 per MD order

## 2014-04-02 NOTE — Progress Notes (Signed)
   04/02/14 1430  Vent Select  Invasive or Noninvasive Invasive  Adult Vent Y  Adult Ventilator Settings  Vent Type Servo i  Humidity HME  Vent Mode PSV  Vt Set 320 mL  Set Rate 18 bmp  FiO2 (%) 40 %  I Time 0.8 Sec(s)  PEEP 5 cmH20  Returned patient to previous settings.  He does not have an adequate minute ventilation to remain on PSV.  Will coordinate with RN to try PSV at a later time.

## 2014-04-02 NOTE — Progress Notes (Signed)
   04/02/14 1413  Vent Select  Invasive or Noninvasive Invasive  Adult Vent Y  Adult Ventilator Settings  Vent Type Servo i  Humidity HME  Vent Mode PSV  FiO2 (%) 40 %  Pressure Support 20 cmH20  PEEP 5 cmH20  Adult Ventilator Measurements  Resp Rate Spontaneous 12 br/min  Resp Rate Total 12 br/min  Exhaled Vt 392 mL  Measured Ve 5 mL  HOB> 30 Degrees Y  Adult Ventilator Alarms  Alarms On Y  Ve High Alarm 15 L/min  Ve Low Alarm 4 L/min  Resp Rate High Alarm 35 br/min  Resp Rate Low Alarm 8  PEEP Low Alarm 4 cmH2O  Press High Alarm 45 cmH2O  T Apnea 20 sec(s)  Placed patient on PSV per MD

## 2014-04-02 NOTE — Progress Notes (Deleted)
Placed patient on PSV 15/5.  Patient is tolerating well.

## 2014-04-03 ENCOUNTER — Inpatient Hospital Stay (HOSPITAL_COMMUNITY): Payer: Medicaid Other

## 2014-04-03 DIAGNOSIS — J9621 Acute and chronic respiratory failure with hypoxia: Secondary | ICD-10-CM

## 2014-04-03 LAB — BLOOD GAS, ARTERIAL
Acid-Base Excess: 3.3 mmol/L — ABNORMAL HIGH (ref 0.0–2.0)
Bicarbonate: 28.8 mEq/L — ABNORMAL HIGH (ref 20.0–24.0)
DRAWN BY: 36496
FIO2: 0.4 %
LHR: 18 {breaths}/min
MECHVT: 320 mL
O2 Saturation: 93.7 %
PATIENT TEMPERATURE: 98.8
PEEP/CPAP: 5 cmH2O
PO2 ART: 77.4 mmHg — AB (ref 80.0–100.0)
TCO2: 30.6 mmol/L (ref 0–100)
pCO2 arterial: 57.5 mmHg (ref 35.0–45.0)
pH, Arterial: 7.322 — ABNORMAL LOW (ref 7.350–7.450)

## 2014-04-03 LAB — MAGNESIUM: Magnesium: 2.6 mg/dL — ABNORMAL HIGH (ref 1.5–2.5)

## 2014-04-03 LAB — CBC
HEMATOCRIT: 48.4 % (ref 39.0–52.0)
HEMOGLOBIN: 14.7 g/dL (ref 13.0–17.0)
MCH: 28.7 pg (ref 26.0–34.0)
MCHC: 30.4 g/dL (ref 30.0–36.0)
MCV: 94.3 fL (ref 78.0–100.0)
Platelets: 235 10*3/uL (ref 150–400)
RBC: 5.13 MIL/uL (ref 4.22–5.81)
RDW: 14.5 % (ref 11.5–15.5)
WBC: 13.7 10*3/uL — ABNORMAL HIGH (ref 4.0–10.5)

## 2014-04-03 LAB — GLUCOSE, CAPILLARY
Glucose-Capillary: 101 mg/dL — ABNORMAL HIGH (ref 70–99)
Glucose-Capillary: 117 mg/dL — ABNORMAL HIGH (ref 70–99)
Glucose-Capillary: 121 mg/dL — ABNORMAL HIGH (ref 70–99)
Glucose-Capillary: 121 mg/dL — ABNORMAL HIGH (ref 70–99)
Glucose-Capillary: 190 mg/dL — ABNORMAL HIGH (ref 70–99)

## 2014-04-03 LAB — PHOSPHORUS: PHOSPHORUS: 6.2 mg/dL — AB (ref 2.3–4.6)

## 2014-04-03 LAB — BASIC METABOLIC PANEL
Anion gap: 10 (ref 5–15)
BUN: 34 mg/dL — ABNORMAL HIGH (ref 6–23)
CO2: 29 mmol/L (ref 19–32)
Calcium: 8.7 mg/dL (ref 8.4–10.5)
Chloride: 109 mmol/L (ref 96–112)
Creatinine, Ser: 1.77 mg/dL — ABNORMAL HIGH (ref 0.50–1.35)
GFR calc Af Amer: 61 mL/min — ABNORMAL LOW (ref >90)
GFR, EST NON AFRICAN AMERICAN: 53 mL/min — AB (ref >90)
Glucose, Bld: 113 mg/dL — ABNORMAL HIGH (ref 70–99)
Potassium: 4.9 mmol/L (ref 3.5–5.1)
Sodium: 148 mmol/L — ABNORMAL HIGH (ref 135–145)

## 2014-04-03 LAB — CULTURE, RESPIRATORY W GRAM STAIN

## 2014-04-03 LAB — CULTURE, RESPIRATORY

## 2014-04-03 LAB — TRIGLYCERIDES: Triglycerides: 109 mg/dL (ref <150)

## 2014-04-03 MED ORDER — SODIUM CHLORIDE 0.9 % IV BOLUS (SEPSIS)
500.0000 mL | Freq: Once | INTRAVENOUS | Status: AC
  Administered 2014-04-03: 500 mL via INTRAVENOUS

## 2014-04-03 MED ORDER — PROPOFOL 10 MG/ML IV EMUL
0.0000 ug/kg/min | INTRAVENOUS | Status: DC
  Administered 2014-04-03: 10 ug/kg/min via INTRAVENOUS
  Administered 2014-04-03: 20 ug/kg/min via INTRAVENOUS
  Administered 2014-04-04 (×2): 50 ug/kg/min via INTRAVENOUS
  Administered 2014-04-04: 40 ug/kg/min via INTRAVENOUS
  Administered 2014-04-04: 50 ug/kg/min via INTRAVENOUS
  Administered 2014-04-05: 45 ug/kg/min via INTRAVENOUS
  Administered 2014-04-05 – 2014-04-07 (×13): 50 ug/kg/min via INTRAVENOUS
  Administered 2014-04-08: 40 ug/kg/min via INTRAVENOUS
  Administered 2014-04-08: 50 ug/kg/min via INTRAVENOUS
  Filled 2014-04-03 (×23): qty 100

## 2014-04-03 NOTE — Progress Notes (Signed)
I came into pts room and vent was alarming.  According to the vent pt wasn't receiving his set Vt and his sats were slowly dropping.  I took the pt off the vent and started bagging him.  Sats quickly returned to upper 90s.   No secretions noted, RN was at bedside and ordered stat CXR.  BBS heard on auscultation but slightly better on the right side.  ETT pulled back 2cm and pt was not auto peeping.  ETT secured at 19cm at lip.  Pt placed on a new ventilator on same vent settings. RT will continue to monitor and MD will be notified.  VSS at this time, awaiting CXR.  RT will continue to monitor.

## 2014-04-03 NOTE — Progress Notes (Signed)
PULMONARY / CRITICAL CARE MEDICINE   Name: [name] MRN: [id number] DOB: 03/27/1990    ADMISSION DATE:  03/31/2014 CONSULTATION DATE:  04/03/2014  REFERRING MD :  EDP  CHIEF COMPLAINT:  SOB  INITIAL PRESENTATION:  24 y.o. M brought to [facility] ED 2/24 with SOB.  In ED, he was hypoxic with sats in 60's.  He was placed on BiPAP but repeat ABG 3 hours later showed progressive respiratory decline.  Given his hx of cervical fusion, micrognathia, prior trach, etc. (which all predispose him to difficult airway), he was intubated prior to ICU admission.  STUDIES:  CTA chest 2/24 >>> negative for PE, limited study due to motion.  SIGNIFICANT EVENTS: 2/24  Admit 2/27  Awake, follows commands on fent / precedex   SUBJECTIVE: RN reports intermittent agitation despite fentanyl / precedex gtt's.  Weaning on 15/5 - note ABG  VITAL SIGNS: Temp:  [97.6 F (36.4 C)-100.4 F (38 C)] 99.1 F (37.3 C) (02/27 0743) Pulse Rate:  [86-110] 91 (02/27 0820) Resp:  [11-28] 28 (02/27 0820) BP: (82-109)/(45-73) 100/54 mmHg (02/27 0820) SpO2:  [92 %-99 %] 97 % (02/27 0820) FiO2 (%):  [40 %] 40 % (02/27 0820) Weight:  [139 lb 12.4 oz (63.4 kg)] 139 lb 12.4 oz (63.4 kg) (02/27 0500)   HEMODYNAMICS:     VENTILATOR SETTINGS: Vent Mode:  [-] CPAP;PSV FiO2 (%):  [40 %] 40 % Set Rate:  [18 bmp] 18 bmp Vt Set:  [320 mL] 320 mL PEEP:  [5 cmH20] 5 cmH20 Pressure Support:  [15 cmH20-20 cmH20] 15 cmH20 Plateau Pressure:  [18 cmH20-23 cmH20] 23 cmH20   INTAKE / OUTPUT: Intake/Output      02/26 0701 - 02/27 0700 02/27 0701 - 02/28 0700   I.V. (mL/kg) 398 (6.3) 54.6 (0.9)   NG/GT 320    IV Piggyback 300    Total Intake(mL/kg) 1018 (16.1) 54.6 (0.9)   Urine (mL/kg/hr) 720 (0.5)    Emesis/NG output 200 (0.1)    Total Output 920     Net +98 +54.6          PHYSICAL EXAMINATION: General: Young male, in NAD. Neuro: awake, alert, follows commands, MAE HEENT: [place]/AT. PERRL, sclerae anicteric.   Micrognathia. Cardiovascular: RRR, no M/R/G.  Lungs: Respirations shallow and unlabored, decreased BS on L.  Otherwise CTA. Abdomen: BS x 4, soft, NT/ND.  Musculoskeletal: R club foot, R hemiatrophy. Skin: Intact, warm, no rashes.  LABS:  CBC  Recent Labs Lab 04/01/14 0236 04/02/14 0240 04/03/14 0313  WBC 8.7 9.4 13.7*  HGB 14.7 15.3 14.7  HCT 47.3 49.1 48.4  PLT 237 254 235   Coag's No results for input(s): APTT, INR in the last 168 hours.   BMET  Recent Labs Lab 04/01/14 0236 04/02/14 0240 04/03/14 0313  NA 143 143 148*  K 3.8 3.8 4.9  CL 106 106 109  CO2 BUN 15 17 34*  CREATININE 0.94 1.16 1.77*  GLUCOSE 105* 102* 113*   Electrolytes  Recent Labs Lab 04/01/14 0236 04/02/14 0240 04/03/14 0313  CALCIUM 8.7 8.9 8.7  MG 2.0 2.3 2.6*  PHOS 3.1 2.8 6.2*   Sepsis Markers  Recent Labs Lab 03/31/14 1837  LATICACIDVEN 1.27   ABG  Recent Labs Lab 04/01/14 0442 04/02/14 0313 04/03/14 0500  PHART 7.404 7.464* 7.322*  PCO2ART 48.3* 40.9 57.5*  PO2ART 160.0* 115.0* 77.4*   Liver Enzymes No results for input(s): AST, ALT, ALKPHOS, BILITOT, ALBUMIN in the last 168 hours.  Cardiac Enzymes No results for input(s): TROPONINI, PROBNP in the last 168 hours.   Glucose  Recent Labs Lab 04/02/14 0838 04/02/14 1209 04/02/14 1554 04/02/14 1927 04/02/14 2358 04/03/14 0713  GLUCAP 106* 112* 140* 121* 190* 117*    Imaging Dg Chest Port 1 View  04/03/2014   CLINICAL DATA:  Shortness of breath  EXAM: PORTABLE CHEST - 1 VIEW  COMPARISON:  04/02/2014  FINDINGS: Nasogastric catheter and endotracheal tube are again identified and stable in appearance. Significant postoperative change in the thoracic spine is noted. Chest wall deformity is again seen and stable. The overall inspiratory effort is poor. Cardiac shadow remains enlarged. No focal confluent infiltrate is seen.  IMPRESSION: Poor inspiratory effort.  No definitive acute abnormality is noted.   Tubes and lines as described.   Electronically Signed   By: Alcide CleverMark  Lukens M.D.   On: 04/03/2014 06:48   Dg Chest Port 1 View  04/02/2014   CLINICAL DATA:  Hypoxia  EXAM: PORTABLE CHEST - 1 VIEW  COMPARISON:  April 01, 2014  FINDINGS: The tip of the endotracheal tube is difficult to confirm given the overlying metallic fixation in the thoracic spine. The endotracheal tube does not extend to the level of the carina and is probably not significantly changed compared to 1 day prior. Nasogastric tube extends below the diaphragm. No pneumothorax. There is stable cardiomegaly. There is no appreciable edema or consolidation. The pulmonary vascularity is normal. There is evidence of old rib trauma on the right.  IMPRESSION: The tip of the endotracheal tube is difficult to discern due to the overlying metallic fixation in the thoracic spine. The tip is above the carina. Nasogastric tube tip and side port below the diaphragm. No pneumothorax. Stable cardiomegaly. No edema or consolidation.   Electronically Signed   By: Bretta BangWilliam  Woodruff III M.D.   On: 04/02/2014 07:13   ASSESSMENT / PLAN:  PULMONARY OETT 2/24 >>> A: Acute hypercarbic and hypoxic respiratory failure - failed BiPAP and required intubation Restrictive lung disease due to severe scoliosis s/p surgical fixation Concern for CAP  P:   PRVC, 320/18/5 Wean PEEP/FiO2 for sats > 90% Daily SBT / WUA  See ID  Cautious extubation given difficult airway.  Extubation vs early trach.  Does not meet criteria for extubation 2/27.    CARDIOVASCULAR A:  Marginal BP with propofol. P:  Monitor hemodynamics. Monitor BP.  RENAL A:   AKI  P:   KVO IVF. Monitor BMP  Lasix 20 mg IV q8 x2 on 2/26, hold further with rise in sr cr  Replace electrolytes as indicated.  GASTROINTESTINAL A:   GI prophylaxis Nutrition P:   SUP: Pantoprazole. TF per nutrition.  HEMATOLOGIC A:   VTE Prophylaxis P:  SCD's / Heparin. Trend CBC   INFECTIOUS A:    Possible CAP - question of prior infiltrate on CXR.   P:   Rocephin, start date 2/25, D3/x Azithro, start date 2/25, D3/x  Consider d/c abx 2/28 if cxr unchanged  ENDOCRINE A:   No known issues P:   SSI if glucose consistently > 180.  NEUROLOGIC A:   Acute metabolic encephalopathy Hx Insomnia, Tourette's syndrome, tic disorder, scoliosis s/p surgical fixation, right hemiatrophy, right club foot P:   Change to propofol  Fentanyl drip for pain  RASS goal: 0 to -1. Daily WUA. Hold outpatient clonidine and dexmethylphenidate Continue adderall 5 mg PO daily.  Family updated: No family bedside 2/27  Interdisciplinary Family Meeting v Palliative Care Meeting:  Due by: 3/1.    Canary Brim, NP-C [place] [facility] Pgr: [phone] or [phone]  Reviewed above, examined.  He remains with respiratory acidosis >> vent settings adjusted.  Changed sedation >> will give fluid to adjust for hypotension.  Don't think he will be able to come off vent on his own >> likely will need trach.  Continue full vent support for now.  CC time by me independent of APP time is 35 minutes.  Coralyn Helling, MD [facility] Pulmonary/Critical Care 04/03/2014, 12:07 PM Pager:  [phone] After 3pm call: [phone]

## 2014-04-03 NOTE — Progress Notes (Signed)
Elink MD notified re: ABG results

## 2014-04-03 NOTE — Progress Notes (Signed)
Patient thrasing around in bed, agitated. OGT taped to ETT pulled out, end of OGT in patient mouth. OGT removed. Will attempt reinsertion when patient stops thrashing in bed.

## 2014-04-04 ENCOUNTER — Inpatient Hospital Stay (HOSPITAL_COMMUNITY): Payer: Medicaid Other

## 2014-04-04 LAB — GLUCOSE, CAPILLARY
GLUCOSE-CAPILLARY: 112 mg/dL — AB (ref 70–99)
GLUCOSE-CAPILLARY: 90 mg/dL (ref 70–99)
Glucose-Capillary: 102 mg/dL — ABNORMAL HIGH (ref 70–99)
Glucose-Capillary: 107 mg/dL — ABNORMAL HIGH (ref 70–99)
Glucose-Capillary: 109 mg/dL — ABNORMAL HIGH (ref 70–99)

## 2014-04-04 LAB — CBC
HCT: 46.5 % (ref 39.0–52.0)
HEMOGLOBIN: 13.9 g/dL (ref 13.0–17.0)
MCH: 29.1 pg (ref 26.0–34.0)
MCHC: 29.9 g/dL — ABNORMAL LOW (ref 30.0–36.0)
MCV: 97.3 fL (ref 78.0–100.0)
PLATELETS: 241 10*3/uL (ref 150–400)
RBC: 4.78 MIL/uL (ref 4.22–5.81)
RDW: 14.7 % (ref 11.5–15.5)
WBC: 11.6 10*3/uL — ABNORMAL HIGH (ref 4.0–10.5)

## 2014-04-04 LAB — BLOOD GAS, ARTERIAL
ACID-BASE EXCESS: 3.3 mmol/L — AB (ref 0.0–2.0)
Bicarbonate: 28.8 mEq/L — ABNORMAL HIGH (ref 20.0–24.0)
Drawn by: 404151
FIO2: 0.4 %
MECHVT: 320 mL
O2 SAT: 90.1 %
PCO2 ART: 56.6 mmHg — AB (ref 35.0–45.0)
PEEP: 5 cmH2O
PO2 ART: 63.5 mmHg — AB (ref 80.0–100.0)
Patient temperature: 99.1
RATE: 18 resp/min
TCO2: 30.5 mmol/L (ref 0–100)
pH, Arterial: 7.328 — ABNORMAL LOW (ref 7.350–7.450)

## 2014-04-04 LAB — BASIC METABOLIC PANEL
ANION GAP: 10 (ref 5–15)
BUN: 42 mg/dL — ABNORMAL HIGH (ref 6–23)
CHLORIDE: 112 mmol/L (ref 96–112)
CO2: 29 mmol/L (ref 19–32)
CREATININE: 1.41 mg/dL — AB (ref 0.50–1.35)
Calcium: 8.4 mg/dL (ref 8.4–10.5)
GFR, EST AFRICAN AMERICAN: 80 mL/min — AB (ref >90)
GFR, EST NON AFRICAN AMERICAN: 69 mL/min — AB (ref >90)
Glucose, Bld: 118 mg/dL — ABNORMAL HIGH (ref 70–99)
POTASSIUM: 4.7 mmol/L (ref 3.5–5.1)
Sodium: 151 mmol/L — ABNORMAL HIGH (ref 135–145)

## 2014-04-04 NOTE — Progress Notes (Signed)
CRITICAL VALUE ALERT  Critical value received:  PCO2 56.6 PO2 63.5   Date of notification:  04/04/14  Time of notification: 0620  Critical value read back: No Nurse who received alert: Hazel Samshristian Annabeth Tortora RN   MD notified (1st page): MD Deterding   Time of first page:  [phone]  MD notified (2nd page):  Time of second page:  Responding MD: MD Deterding   Time MD responded: [phone]

## 2014-04-04 NOTE — Progress Notes (Signed)
PULMONARY / CRITICAL CARE MEDICINE   Name: [name] MRN: [id number] DOB: December 12, 1990    ADMISSION DATE:  03/31/2014 CONSULTATION DATE:  04/04/2014  REFERRING MD :  EDP  CHIEF COMPLAINT:  SOB  INITIAL PRESENTATION:  24 y.o. M brought to [facility] ED 2/24 with SOB.  In ED, he was hypoxic with sats in 60's.  He was placed on BiPAP but repeat ABG 3 hours later showed progressive respiratory decline.  Given his hx of cervical fusion, micrognathia, prior trach, etc. (which all predispose him to difficult airway), he was intubated prior to ICU admission.  STUDIES:  CTA chest 2/24 >>> negative for PE, limited study due to motion.  SIGNIFICANT EVENTS: 2/24  Admit 2/27  Awake, follows commands on fent / precedex  2/28  Remains on PRVC, agitation / sedation issues.  Weaned briefly on 15/5  SUBJECTIVE: RN reports no acute events. Tolerating propofol / fent but continues to have periods of intermittent agitation  VITAL SIGNS: Temp:  [98.9 F (37.2 C)-100.6 F (38.1 C)] 98.9 F (37.2 C) (02/28 0727) Pulse Rate:  [78-99] 78 (02/28 0807) Resp:  [0-20] 18 (02/28 0807) BP: (81-106)/(43-79) 100/55 mmHg (02/28 0800) SpO2:  [62 %-100 %] 99 % (02/28 0807) FiO2 (%):  [40 %] 40 % (02/28 0807) Weight:  [148 lb 5.9 oz (67.3 kg)] 148 lb 5.9 oz (67.3 kg) (02/28 0500)   HEMODYNAMICS:     VENTILATOR SETTINGS: Vent Mode:  [-] PRVC FiO2 (%):  [40 %] 40 % Set Rate:  [18 bmp] 18 bmp Vt Set:  [320 mL] 320 mL PEEP:  [5 cmH20] 5 cmH20 Plateau Pressure:  [22 cmH20-26 cmH20] 22 cmH20   INTAKE / OUTPUT: Intake/Output      02/27 0701 - 02/28 0700 02/28 0701 - 02/29 0700   I.V. (mL/kg) 688.8 (10.2) 19 (0.3)   NG/GT 630 30   IV Piggyback 300    Total Intake(mL/kg) 1618.8 (24.1) 49 (0.7)   Urine (mL/kg/hr) 1380 (0.9) 40 (0.2)   Emesis/NG output     Total Output 1380 40   Net +238.8 +9          PHYSICAL EXAMINATION: General: Young male, in NAD. Neuro: sedate on vent  HEENT: [place]/AT. PERRL, sclerae  anicteric.  Micrognathia. Cardiovascular: RRR, no M/R/G.  Lungs: Respirations shallow and unlabored, decreased BS on L.  Otherwise CTA. Abdomen: BS x 4, soft, NT/ND.  Musculoskeletal: R club foot, R hemiatrophy. Skin: Intact, warm, no rashes.  LABS:  CBC  Recent Labs Lab 04/02/14 0240 04/03/14 0313 04/04/14 0314  WBC 9.4 13.7* 11.6*  HGB 15.3 14.7 13.9  HCT 49.1 48.4 46.5  PLT 254 235 241   Coag's No results for input(s): APTT, INR in the last 168 hours.   BMET  Recent Labs Lab 04/02/14 0240 04/03/14 0313 04/04/14 0314  NA 143 148* 151*  K 3.8 4.9 4.7  CL 106 109 112  CO2 BUN 17 34* 42*  CREATININE 1.16 1.77* 1.41*  GLUCOSE 102* 113* 118*   Electrolytes  Recent Labs Lab 04/01/14 0236 04/02/14 0240 04/03/14 0313 04/04/14 0314  CALCIUM 8.7 8.9 8.7 8.4  MG 2.0 2.3 2.6*  --   PHOS 3.1 2.8 6.2*  --    Sepsis Markers  Recent Labs Lab 03/31/14 1837  LATICACIDVEN 1.27   ABG  Recent Labs Lab 04/02/14 0313 04/03/14 0500 04/04/14 0510  PHART 7.464* 7.322* 7.328*  PCO2ART 40.9 57.5* 56.6*  PO2ART 115.0* 77.4* 63.5*   Liver Enzymes  No results for input(s): AST, ALT, ALKPHOS, BILITOT, ALBUMIN in the last 168 hours.   Cardiac Enzymes No results for input(s): TROPONINI, PROBNP in the last 168 hours.   Glucose  Recent Labs Lab 04/03/14 0713 04/03/14 1115 04/03/14 1527 04/03/14 1946 04/04/14 0016 04/04/14 0705  GLUCAP 117* 121* 121* 101* 107* 109*    Imaging Dg Chest Port 1 View  04/04/2014   CLINICAL DATA:  Acute respiratory failure  EXAM: PORTABLE CHEST - 1 VIEW  COMPARISON:  Radiograph 04/03/2014  FINDINGS: Endotracheal tip is difficult to define again posterior spinal fusion. NG tube extends to the stomach. Stable cardiac silhouette low lung volumes. Left basilar atelectasis.  IMPRESSION: 1. No interval change. 2. Support apparatus appears stable. 3. Low lung volumes and left basilar atelectasis.   Electronically Signed   By:  Genevive BiStewart  Edmunds M.D.   On: 04/04/2014 09:07   Dg Chest Port 1 View  04/03/2014   CLINICAL DATA:  ETT adjusted  EXAM: PORTABLE CHEST - 1 VIEW  COMPARISON:  04/03/2014  FINDINGS: Endotracheal tube is poorly visualized but likely terminates 5 cm above the carina.  Low lung volumes. Left lung base is obscured. No pleural effusion or pneumothorax.  Cardiomegaly.  Enteric tube terminates in the stomach.  Multiple rib deformities, chronic.  Thoracic spinal rods.  IMPRESSION: Endotracheal tube is poorly visualized but likely terminates 5 cm above the carina.   Electronically Signed   By: Charline BillsSriyesh  Krishnan M.D.   On: 04/03/2014 13:42   Dg Chest Port 1 View  04/03/2014   CLINICAL DATA:  Shortness of breath  EXAM: PORTABLE CHEST - 1 VIEW  COMPARISON:  04/02/2014  FINDINGS: Nasogastric catheter and endotracheal tube are again identified and stable in appearance. Significant postoperative change in the thoracic spine is noted. Chest wall deformity is again seen and stable. The overall inspiratory effort is poor. Cardiac shadow remains enlarged. No focal confluent infiltrate is seen.  IMPRESSION: Poor inspiratory effort.  No definitive acute abnormality is noted.  Tubes and lines as described.   Electronically Signed   By: Alcide CleverMark  Lukens M.D.   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  Extubation vs early trach.  Does not meet criteria for extubation 2/28.    CARDIOVASCULAR A:  Marginal BP with propofol. P:  Monitor hemodynamics. Monitor BP  RENAL A:   AKI  P:   KVO IVF. Monitor BMP  Replace electrolytes as indicated.  GASTROINTESTINAL A:   GI prophylaxis Nutrition P:    SUP: Pantoprazole. TF per nutrition.  HEMATOLOGIC A:   VTE Prophylaxis P:  SCD's / Heparin. Trend CBC   INFECTIOUS A:   Possible CAP - question of prior infiltrate on CXR.   P:   Rocephin, start date 2/25, D4/4 Azithro, start date 2/25, D4/4  CXR unchanged, d/c abx 2/28 and monitor clinically   ENDOCRINE A:   No known issues P:   SSI if glucose consistently > 180.  NEUROLOGIC A:   Acute metabolic encephalopathy Hx Insomnia, Tourette's syndrome, tic disorder, scoliosis s/p surgical fixation, right hemiatrophy, right club foot P:   Continue propofol  Fentanyl drip for pain  RASS goal: 0 to -1.  Daily WUA. Hold outpatient clonidine and dexmethylphenidate Continue adderall 5 mg PO daily.  Family updated: No family bedside 2/28.  Spoke with Aunt in late afternoon 2/27.  Pt will likely need tracheostomy per ENT.    Interdisciplinary Family Meeting v Palliative Care Meeting:  Due by: 3/1.   Canary Brim, NP-C [place] [facility] Pgr: [phone] or [phone]   Reviewed above, examined.  Remains on full vent support and sedation.  RASS -1.  Scattered rhonchi.  Continue vent support >> will continue to d/w family about probable need for tracheostomy and long term intermittent vent support.  CC time by me independent of APP time 35 minutes.  Updated pt's family at bedside.  Coralyn Helling, MD [facility] Pulmonary/Critical Care 04/04/2014, 12:01 PM Pager:  [phone] After 3pm call: [phone]

## 2014-04-05 ENCOUNTER — Inpatient Hospital Stay (HOSPITAL_COMMUNITY): Payer: Medicaid Other

## 2014-04-05 DIAGNOSIS — R0600 Dyspnea, unspecified: Secondary | ICD-10-CM

## 2014-04-05 DIAGNOSIS — J9600 Acute respiratory failure, unspecified whether with hypoxia or hypercapnia: Secondary | ICD-10-CM | POA: Diagnosis present

## 2014-04-05 DIAGNOSIS — Z4659 Encounter for fitting and adjustment of other gastrointestinal appliance and device: Secondary | ICD-10-CM | POA: Diagnosis present

## 2014-04-05 DIAGNOSIS — Z9289 Personal history of other medical treatment: Secondary | ICD-10-CM

## 2014-04-05 DIAGNOSIS — J9601 Acute respiratory failure with hypoxia: Secondary | ICD-10-CM | POA: Diagnosis present

## 2014-04-05 LAB — BASIC METABOLIC PANEL
Anion gap: 6 (ref 5–15)
BUN: 28 mg/dL — ABNORMAL HIGH (ref 6–23)
CO2: 31 mmol/L (ref 19–32)
CREATININE: 1.12 mg/dL (ref 0.50–1.35)
Calcium: 8.3 mg/dL — ABNORMAL LOW (ref 8.4–10.5)
Chloride: 113 mmol/L — ABNORMAL HIGH (ref 96–112)
GFR calc Af Amer: >90 mL/min (ref >90)
GFR calc non Af Amer: >90 mL/min (ref >90)
GLUCOSE: 114 mg/dL — AB (ref 70–99)
Potassium: 4.5 mmol/L (ref 3.5–5.1)
Sodium: 150 mmol/L — ABNORMAL HIGH (ref 135–145)

## 2014-04-05 LAB — GLUCOSE, CAPILLARY
GLUCOSE-CAPILLARY: 87 mg/dL (ref 70–99)
GLUCOSE-CAPILLARY: 122 mg/dL — AB (ref 70–99)
Glucose-Capillary: 102 mg/dL — ABNORMAL HIGH (ref 70–99)
Glucose-Capillary: 108 mg/dL — ABNORMAL HIGH (ref 70–99)
Glucose-Capillary: 117 mg/dL — ABNORMAL HIGH (ref 70–99)
Glucose-Capillary: 160 mg/dL — ABNORMAL HIGH (ref 70–99)

## 2014-04-05 LAB — URINALYSIS, ROUTINE W REFLEX MICROSCOPIC
BILIRUBIN URINE: NEGATIVE
Glucose, UA: NEGATIVE mg/dL
Ketones, ur: NEGATIVE mg/dL
LEUKOCYTES UA: NEGATIVE
NITRITE: NEGATIVE
PH: 5.5 (ref 5.0–8.0)
Protein, ur: NEGATIVE mg/dL
SPECIFIC GRAVITY, URINE: 1.027 (ref 1.005–1.030)
UROBILINOGEN UA: 0.2 mg/dL (ref 0.0–1.0)

## 2014-04-05 LAB — CBC
HEMATOCRIT: 43.9 % (ref 39.0–52.0)
Hemoglobin: 13 g/dL (ref 13.0–17.0)
MCH: 28.5 pg (ref 26.0–34.0)
MCHC: 29.6 g/dL — ABNORMAL LOW (ref 30.0–36.0)
MCV: 96.3 fL (ref 78.0–100.0)
Platelets: 238 10*3/uL (ref 150–400)
RBC: 4.56 MIL/uL (ref 4.22–5.81)
RDW: 14.7 % (ref 11.5–15.5)
WBC: 9.7 10*3/uL (ref 4.0–10.5)

## 2014-04-05 LAB — URINE MICROSCOPIC-ADD ON

## 2014-04-05 MED ORDER — VANCOMYCIN HCL 10 G IV SOLR
1250.0000 mg | Freq: Once | INTRAVENOUS | Status: AC
  Administered 2014-04-05: 1250 mg via INTRAVENOUS
  Filled 2014-04-05: qty 1250

## 2014-04-05 MED ORDER — ACETAMINOPHEN 160 MG/5ML PO SOLN
650.0000 mg | ORAL | Status: DC | PRN
  Administered 2014-04-05 – 2014-04-08 (×5): 650 mg
  Filled 2014-04-05 (×6): qty 20.3

## 2014-04-05 MED ORDER — RISPERIDONE 1 MG/ML PO SOLN
1.0000 mg | Freq: Two times a day (BID) | ORAL | Status: DC
  Administered 2014-04-05 – 2014-04-06 (×4): 1 mg
  Filled 2014-04-05 (×7): qty 1

## 2014-04-05 MED ORDER — VANCOMYCIN HCL IN DEXTROSE 750-5 MG/150ML-% IV SOLN
750.0000 mg | Freq: Two times a day (BID) | INTRAVENOUS | Status: DC
  Administered 2014-04-06 – 2014-04-07 (×3): 750 mg via INTRAVENOUS
  Filled 2014-04-05 (×4): qty 150

## 2014-04-05 MED ORDER — DEXTROSE 5 % IV SOLN
1.0000 g | Freq: Three times a day (TID) | INTRAVENOUS | Status: DC
  Administered 2014-04-05 – 2014-04-07 (×6): 1 g via INTRAVENOUS
  Filled 2014-04-05 (×8): qty 1

## 2014-04-05 MED ORDER — FREE WATER
200.0000 mL | Freq: Four times a day (QID) | Status: DC
  Administered 2014-04-05 – 2014-04-06 (×3): 200 mL

## 2014-04-05 NOTE — Progress Notes (Signed)
PULMONARY / CRITICAL CARE MEDICINE   Name: Graciano Gittins MRN: [id number] DOB: [birth year]    ADMISSION DATE:  03/31/2014 CONSULTATION DATE:  04/05/2014  REFERRING MD :  EDP  CHIEF COMPLAINT:  SOB  INITIAL PRESENTATION:  24 y.o. M brought to [facility] ED 2/24 with SOB.  In ED, he was hypoxic with sats in 60's.  He was placed on BiPAP but repeat ABG 3 hours later showed progressive respiratory decline.  Given his hx of cervical fusion, micrognathia, prior trach, etc. (which all predispose him to difficult airway), he was intubated prior to ICU admission.  STUDIES:  CTA chest 2/24 >>> negative for PE, limited study due to motion.  SIGNIFICANT EVENTS: 2/24  Admit 2/27  Awake, follows commands on fent / precedex  2/28  Remains on PRVC, agitation / sedation issues.  Weaned briefly on 15/5  SUBJECTIVE: RN reports no acute events. Tolerating propofol / fent but continues to have periods of intermittent agitation  VITAL SIGNS: Temp:  [98.2 F (36.8 C)-100.9 F (38.3 C)] 100.6 F (38.1 C) (02/29 1100) Pulse Rate:  [80-124] 94 (02/29 1130) Resp:  [16-33] 20 (02/29 1130) BP: (99-130)/(43-73) 111/50 mmHg (02/29 1130) SpO2:  [92 %-100 %] 99 % (02/29 1130) FiO2 (%):  [40 %] 40 % (02/29 0826) Weight:  [65.3 kg (143 lb 15.4 oz)-66.5 kg (146 lb 9.7 oz)] 65.3 kg (143 lb 15.4 oz) (02/29 0900)   HEMODYNAMICS:     VENTILATOR SETTINGS: Vent Mode:  [-] PRVC FiO2 (%):  [40 %] 40 % Set Rate:  [20 bmp] 20 bmp Vt Set:  [320 mL] 320 mL PEEP:  [5 cmH20] 5 cmH20 Plateau Pressure:  [23 cmH20-27 cmH20] 24 cmH20   INTAKE / OUTPUT: Intake/Output      02/28 0701 - 02/29 0700 02/29 0701 - 03/01 0700   I.V. (mL/kg) 858 (12.9) 153.5 (2.4)   NG/GT 660 210   IV Piggyback 300    Total Intake(mL/kg) 1818 (27.3) 363.5 (5.6)   Urine (mL/kg/hr) 945 (0.6) 75 (0.2)   Emesis/NG output 500 (0.3)    Total Output 1445 75   Net +373 +288.5          PHYSICAL EXAMINATION: General: Young male, in NAD. Neuro: sedate on  vent rass -2 HEENT: [place]/AT. PERRL, sclerae anicteric.  Micrognathia. Cardiovascular: RRR, no M/R/G.  Lungs: reduced Abdomen: BS x 4, soft, NT/ND.  Musculoskeletal: R club foot, R hemiatrophy. Skin: Intact, warm, no rashes.  LABS:  CBC  Recent Labs Lab 04/03/14 0313 04/04/14 0314 04/05/14 0230  WBC 13.7* 11.6* 9.7  HGB 14.7 13.9 13.0  HCT 48.4 46.5 43.9  PLT 235 241 238   Coag's No results for input(s): APTT, INR in the last 168 hours.   BMET  Recent Labs Lab 04/03/14 0313 04/04/14 0314 04/05/14 0230  NA 148* 151* 150*  K 4.9 4.7 4.5  CL 109 112 113*  CO2 BUN 34* 42* 28*  CREATININE 1.77* 1.41* 1.12  GLUCOSE 113* 118* 114*   Electrolytes  Recent Labs Lab 04/01/14 0236 04/02/14 0240 04/03/14 0313 04/04/14 0314 04/05/14 0230  CALCIUM 8.7 8.9 8.7 8.4 8.3*  MG 2.0 2.3 2.6*  --   --   PHOS 3.1 2.8 6.2*  --   --    Sepsis Markers  Recent Labs Lab 03/31/14 1837  LATICACIDVEN 1.27   ABG  Recent Labs Lab 04/02/14 0313 04/03/14 0500 04/04/14 0510  PHART 7.464* 7.322* 7.328*  PCO2ART 40.9 57.5* 56.6*  PO2ART 115.0*  77.4* 63.5*   Liver Enzymes No results for input(s): AST, ALT, ALKPHOS, BILITOT, ALBUMIN in the last 168 hours.   Cardiac Enzymes No results for input(s): TROPONINI, PROBNP in the last 168 hours.   Glucose  Recent Labs Lab 04/04/14 1123 04/04/14 1507 04/04/14 1938 04/05/14 0003 04/05/14 0406 04/05/14 0751  GLUCAP 112* 102* 90 160* 87 108*    Imaging Dg Chest Port 1 View  04/05/2014   CLINICAL DATA:  Acute respiratory failure, shortness of breath  EXAM: PORTABLE CHEST - 1 VIEW  COMPARISON:  Portable chest x-ray of April 04, 2014  FINDINGS: The lung volumes remain low. The cardiac silhouette remains enlarged. The retrocardiac region remains dense and the left hemidiaphragm obscured. Somewhat increased interstitial density in the right mid and lower lung has developed. The endotracheal tube tip lies 6.7 cm above the  carina. The esophagogastric tube tip projects below the inferior margin of the image. The proximal port is at the level of the GE junction. The Harrington rods are unchanged in appearance.  IMPRESSION: There has been slight interval deterioration in the appearance of the pulmonary interstitium consistent with pulmonary interstitial edema with bibasilar atelectasis or pneumonia.   Electronically Signed   By: David  [place]   On: 04/05/2014 07:17   Dg Chest Port 1 View  04/04/2014   CLINICAL DATA:  Acute respiratory failure  EXAM: PORTABLE CHEST - 1 VIEW  COMPARISON:  Radiograph 04/03/2014  FINDINGS: Endotracheal tip is difficult to define again posterior spinal fusion. NG tube extends to the stomach. Stable cardiac silhouette low lung volumes. Left basilar atelectasis.  IMPRESSION: 1. No interval change. 2. Support apparatus appears stable. 3. Low lung volumes and left basilar atelectasis.   Electronically Signed   By: Genevive Bi M.D.   On: 04/04/2014 09:07   Dg Chest Port 1 View  04/03/2014   CLINICAL DATA:  ETT adjusted  EXAM: PORTABLE CHEST - 1 VIEW  COMPARISON:  04/03/2014  FINDINGS: Endotracheal tube is poorly visualized but likely terminates 5 cm above the carina.  Low lung volumes. Left lung base is obscured. No pleural effusion or pneumothorax.  Cardiomegaly.  Enteric tube terminates in the stomach.  Multiple rib deformities, chronic.  Thoracic spinal rods.  IMPRESSION: Endotracheal tube is poorly visualized but likely terminates 5 cm above the carina.   Electronically Signed   By: Charline Bills M.D.   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 P:  Monitor hemodynamics. Monitor BP consider echo assessment with pcxr findings  RENAL A:   AKI, hypernatremia P:   Add free water  Chem in am  May need d5w  GASTROINTESTINAL A:   GI prophylaxis Nutrition P:   SUP: Pantoprazole. TF per nutrition.  HEMATOLOGIC A:   VTE Prophylaxis P:  SCD's / Heparin. Trend CBC   INFECTIOUS A:   Possible CAP - question of prior infiltrate on CXR.   P:   Rocephin, start date 2/25>>>2/29 ceftaz 2/29>>> Azithro, start date 2/25>>>2/29  Remains with NF, culture neg but has fever, increased infiltrates Change ceftr  to ceftaz Add vanc Repeat sputum  ENDOCRINE A:   No known issues P:   SSI if glucose consistently > 180.  NEUROLOGIC A:   Acute metabolic encephalopathy Hx Insomnia, Tourette's syndrome, tic disorder, scoliosis s/p surgical fixation, right hemiatrophy, right club foot P:   Continue propofol with WUA Fentanyl drip for pain  RASS goal: 0 to -1. Hold outpatient clonidine and dexmethylphenidate Dc adderall 5 mg PO daily. Add Risperdal   Family updated: updated family 2/29  Interdisciplinary Family Meeting v Palliative Care Meeting:  Due by: 3/1.  Ccm time 30 min   Mcarthur Rossettianiel J. Tyson AliasFeinstein, MD, FACP Pgr: [phone] [place] [facility]

## 2014-04-05 NOTE — Progress Notes (Signed)
ANTIBIOTIC CONSULT NOTE - INITIAL  Pharmacy Consult for vancomycin + ceftazidime Indication: rule out pneumonia  No Known Allergies  Patient Measurements: Height: 4\' 10"  (147.3 cm) Weight: 143 lb 15.4 oz (65.3 kg) (new weight- propofol setup updated) IBW/kg (Calculated) : 45.4 Adjusted Body Weight:   Vital Signs: Temp: 100.6 F (38.1 C) (02/29 1100) Temp Source: Oral (02/29 1100) BP: 111/50 mmHg (02/29 1130) Pulse Rate: 94 (02/29 1130) Intake/Output from previous day: 02/28 0701 - 02/29 0700 In: 1818 [I.V.:858; NG/GT:660; IV Piggyback:300] Out: 1445 [Urine:945; Emesis/NG output:500] Intake/Output from this shift: Total I/O In: 363.5 [I.V.:153.5; NG/GT:210] Out: 75 [Urine:75]  Labs:  Recent Labs  04/03/14 0313 04/04/14 0314 04/05/14 0230  WBC 13.7* 11.6* 9.7  HGB 14.7 13.9 13.0  PLT 235 241 238  CREATININE 1.77* 1.41* 1.12   Estimated Creatinine Clearance: 77.5 mL/min (by C-G formula based on Cr of 1.12). No results for input(s): VANCOTROUGH, VANCOPEAK, VANCORANDOM, GENTTROUGH, GENTPEAK, GENTRANDOM, TOBRATROUGH, TOBRAPEAK, TOBRARND, AMIKACINPEAK, AMIKACINTROU, AMIKACIN in the last 72 hours.   Microbiology: Recent Results (from the past 720 hour(s))  Blood culture (routine x 2)     Status: None (Preliminary result)   Collection Time: 03/31/14  7:50 PM  Result Value Ref Range Status   Specimen Description BLOOD RIGHT HAND  Final   Special Requests BOTTLES DRAWN AEROBIC ONLY 10CC  Final   Culture   Final           BLOOD CULTURE RECEIVED NO GROWTH TO DATE CULTURE WILL BE HELD FOR 5 DAYS BEFORE ISSUING A FINAL NEGATIVE REPORT Performed at [facility]    Report Status PENDING  Incomplete  Blood culture (routine x 2)     Status: None (Preliminary result)   Collection Time: 03/31/14  8:00 PM  Result Value Ref Range Status   Specimen Description BLOOD LEFT ARM  Final   Special Requests BOTTLES DRAWN AEROBIC ONLY 10CC  Final   Culture   Final           BLOOD  CULTURE RECEIVED NO GROWTH TO DATE CULTURE WILL BE HELD FOR 5 DAYS BEFORE ISSUING A FINAL NEGATIVE REPORT Performed at [facility]    Report Status PENDING  Incomplete  MRSA PCR Screening     Status: None   Collection Time: 03/31/14 11:47 PM  Result Value Ref Range Status   MRSA by PCR NEGATIVE NEGATIVE Final    Comment:        The GeneXpert MRSA Assay (FDA approved for NASAL specimens only), is one component of a comprehensive MRSA colonization surveillance program. It is not intended to diagnose MRSA infection nor to guide or monitor treatment for MRSA infections.   Culture, respiratory (NON-Expectorated)     Status: None   Collection Time: 04/01/14  2:33 AM  Result Value Ref Range Status   Specimen Description TRACHEAL ASPIRATE  Final   Special Requests NONE  Final   Gram Stain   Final    FEW WBC PRESENT, PREDOMINANTLY PMN FEW SQUAMOUS EPITHELIAL CELLS PRESENT FEW GRAM POSITIVE COCCI IN PAIRS FEW GRAM POSITIVE RODS Performed at [facility]    Culture   Final    Non-Pathogenic Oropharyngeal-type Flora Isolated. Performed at [facility]    Report Status 04/03/2014 FINAL  Final    Medical History: Past Medical History  Diagnosis Date  . Tourette's syndrome   . Tic disorder   . Scoliosis   . Hemiatrophy of right leg   . Club foot  Right    Medications:  Anti-infectives    Start     Dose/Rate Route Frequency Ordered Stop   04/06/14 0300  vancomycin (VANCOCIN) IVPB 750 mg/150 ml premix     750 mg 150 mL/hr over 60 Minutes Intravenous Every 12 hours 04/05/14 1240     04/05/14 1400  cefTAZidime (FORTAZ) 1 g in dextrose 5 % 50 mL IVPB     1 g 100 mL/hr over 30 Minutes Intravenous 3 times per day 04/05/14 1238     04/05/14 1330  vancomycin (VANCOCIN) 1,250 mg in sodium chloride 0.9 % 250 mL IVPB     1,250 mg 166.7 mL/hr over 90 Minutes Intravenous  Once 04/05/14 1240     04/01/14 0230  cefTRIAXone (ROCEPHIN) 1 g in dextrose 5 % 50 mL  IVPB - Premix  Status:  Discontinued     1 g 100 mL/hr over 30 Minutes Intravenous Daily at bedtime 04/01/14 0155 04/05/14 1234   04/01/14 0230  azithromycin (ZITHROMAX) 500 mg in dextrose 5 % 250 mL IVPB     500 mg 250 mL/hr over 60 Minutes Intravenous Daily at bedtime 04/01/14 0155 04/05/14 2359     Assessment: 23 yom presented to the hospital with SOB. Initially started on ceftriaxone + azithromycin for treatment of CAP. Now broadening coverage due to increasing infiltrates per MD and low-grade fever. WBC is WNL. Scr has now normalized.   Ceftaz 2/29>> Vanc 2/29>> Rocephin 2/25>>2/29 Azith 2/25>>2/29  Goal of Therapy:  Vancomycin trough level 15-20 mcg/ml  Plan:  1. Vancomycin  IV x 1 then  IV Q12H 2. Ceftazidime 1gm IV Q8H 3. F/u renal fxn, C&S, clinical status and trough at [facility], Drake Leach 04/05/2014,12:43 PM

## 2014-04-05 NOTE — Progress Notes (Signed)
  Echocardiogram 2D Echocardiogram has been performed.  Leta JunglingCooper, Taeshaun Rames M 04/05/2014, 4:33 PM

## 2014-04-06 ENCOUNTER — Inpatient Hospital Stay (HOSPITAL_COMMUNITY): Payer: Medicaid Other

## 2014-04-06 DIAGNOSIS — R0602 Shortness of breath: Secondary | ICD-10-CM

## 2014-04-06 LAB — COMPREHENSIVE METABOLIC PANEL
ALBUMIN: 3.2 g/dL — AB (ref 3.5–5.2)
ALK PHOS: 48 U/L (ref 39–117)
ALT: 30 U/L (ref 0–53)
AST: 55 U/L — ABNORMAL HIGH (ref 0–37)
Anion gap: 7 (ref 5–15)
BUN: 24 mg/dL — ABNORMAL HIGH (ref 6–23)
CO2: 32 mmol/L (ref 19–32)
Calcium: 8.7 mg/dL (ref 8.4–10.5)
Chloride: 112 mmol/L (ref 96–112)
Creatinine, Ser: 1.05 mg/dL (ref 0.50–1.35)
GFR calc Af Amer: >90 mL/min (ref >90)
GFR calc non Af Amer: >90 mL/min (ref >90)
GLUCOSE: 152 mg/dL — AB (ref 70–99)
Potassium: 4.5 mmol/L (ref 3.5–5.1)
Sodium: 151 mmol/L — ABNORMAL HIGH (ref 135–145)
TOTAL PROTEIN: 6.7 g/dL (ref 6.0–8.3)
Total Bilirubin: 0.6 mg/dL (ref 0.3–1.2)

## 2014-04-06 LAB — CBC WITH DIFFERENTIAL/PLATELET
BASOS PCT: 0 % (ref 0–1)
Basophils Absolute: 0 10*3/uL (ref 0.0–0.1)
EOS PCT: 2 % (ref 0–5)
Eosinophils Absolute: 0.2 10*3/uL (ref 0.0–0.7)
HCT: 47.5 % (ref 39.0–52.0)
HEMOGLOBIN: 13.6 g/dL (ref 13.0–17.0)
Lymphocytes Relative: 13 % (ref 12–46)
Lymphs Abs: 1.4 10*3/uL (ref 0.7–4.0)
MCH: 28.5 pg (ref 26.0–34.0)
MCHC: 28.6 g/dL — AB (ref 30.0–36.0)
MCV: 99.6 fL (ref 78.0–100.0)
Monocytes Absolute: 1.7 10*3/uL — ABNORMAL HIGH (ref 0.1–1.0)
Monocytes Relative: 16 % — ABNORMAL HIGH (ref 3–12)
NEUTROS PCT: 69 % (ref 43–77)
Neutro Abs: 7.3 10*3/uL (ref 1.7–7.7)
PLATELETS: 244 10*3/uL (ref 150–400)
RBC: 4.77 MIL/uL (ref 4.22–5.81)
RDW: 14.4 % (ref 11.5–15.5)
WBC: 10.6 10*3/uL — ABNORMAL HIGH (ref 4.0–10.5)

## 2014-04-06 LAB — GLUCOSE, CAPILLARY
GLUCOSE-CAPILLARY: 91 mg/dL (ref 70–99)
GLUCOSE-CAPILLARY: 109 mg/dL — AB (ref 70–99)
GLUCOSE-CAPILLARY: 102 mg/dL — AB (ref 70–99)
Glucose-Capillary: 120 mg/dL — ABNORMAL HIGH (ref 70–99)
Glucose-Capillary: 163 mg/dL — ABNORMAL HIGH (ref 70–99)
Glucose-Capillary: 98 mg/dL (ref 70–99)

## 2014-04-06 LAB — TRIGLYCERIDES: Triglycerides: 174 mg/dL — ABNORMAL HIGH (ref <150)

## 2014-04-06 MED ORDER — CHLORHEXIDINE GLUCONATE 4 % EX LIQD
1.0000 "application " | Freq: Once | CUTANEOUS | Status: AC
  Administered 2014-04-06: 1 via TOPICAL
  Filled 2014-04-06: qty 15

## 2014-04-06 MED ORDER — DEXTROSE 5 % IV SOLN
INTRAVENOUS | Status: DC
  Administered 2014-04-06: 17:00:00 via INTRAVENOUS

## 2014-04-06 MED ORDER — POLYETHYLENE GLYCOL 3350 17 G PO PACK
17.0000 g | PACK | Freq: Every day | ORAL | Status: DC
  Administered 2014-04-06 – 2014-04-28 (×10): 17 g via ORAL
  Filled 2014-04-06 (×23): qty 1

## 2014-04-06 MED ORDER — FREE WATER
200.0000 mL | Freq: Four times a day (QID) | Status: DC
  Administered 2014-04-06 – 2014-04-08 (×8): 200 mL

## 2014-04-06 MED ORDER — INFLUENZA VAC SPLIT QUAD 0.5 ML IM SUSY
0.5000 mL | PREFILLED_SYRINGE | INTRAMUSCULAR | Status: AC
  Administered 2014-04-07: 0.5 mL via INTRAMUSCULAR
  Filled 2014-04-06: qty 0.5

## 2014-04-06 MED ORDER — PNEUMOCOCCAL VAC POLYVALENT 25 MCG/0.5ML IJ INJ
0.5000 mL | INJECTION | INTRAMUSCULAR | Status: AC
  Administered 2014-04-07: 0.5 mL via INTRAMUSCULAR
  Filled 2014-04-06: qty 0.5

## 2014-04-06 NOTE — Progress Notes (Addendum)
PULMONARY / CRITICAL CARE MEDICINE   Name: [name] MRN: [id number] DOB: 04-Oct-1990    ADMISSION DATE:  03/31/2014 CONSULTATION DATE:  04/06/2014  REFERRING MD :  EDP  CHIEF COMPLAINT:  SOB  INITIAL PRESENTATION:  24 y.o. M brought to [facility] ED 2/24 with SOB.  In ED, he was hypoxic with sats in 60's.  He was placed on BiPAP but repeat ABG 3 hours later showed progressive respiratory decline.  Given his hx of cervical fusion, micrognathia, prior trach, etc. (which all predispose him to difficult airway), he was intubated prior to ICU admission.  STUDIES:  CTA chest 2/24 >>> negative for PE, limited study due to motion.  SIGNIFICANT EVENTS: 2/24  Admit 2/27  Awake, follows commands on fent / precedex  2/28  Remains on PRVC, agitation / sedation issues.  Weaned briefly on 15/5  SUBJECTIVE: NO BM, remains vented, ent consulted  VITAL SIGNS: Temp:  [98.2 F (36.8 C)-101.8 F (38.8 C)] 98.2 F (36.8 C) (03/01 1500) Pulse Rate:  [79-124] 80 (03/01 1500) Resp:  [14-27] 19 (03/01 1500) BP: (85-146)/(47-91) 119/74 mmHg (03/01 1500) SpO2:  [94 %-100 %] 100 % (03/01 1500) FiO2 (%):  [40 %] 40 % (03/01 1205) Weight:  [66.5 kg (146 lb 9.7 oz)] 66.5 kg (146 lb 9.7 oz) (03/01 0400)   HEMODYNAMICS:     VENTILATOR SETTINGS: Vent Mode:  [-] PRVC FiO2 (%):  [40 %] 40 % Set Rate:  [20 bmp] 20 bmp Vt Set:  [320 mL] 320 mL PEEP:  [5 cmH20] 5 cmH20 Plateau Pressure:  [23 cmH20-25 cmH20] 24 cmH20   INTAKE / OUTPUT: Intake/Output      02/29 0701 - 03/01 0700 03/01 0701 - 03/02 0700   I.V. (mL/kg) 992.8 (14.9) 441 (6.6)   NG/GT 1600 530   IV Piggyback 800 150   Total Intake(mL/kg) 3392.8 (51) 1121 (16.9)   Urine (mL/kg/hr) 1020 (0.6) 410 (0.7)   Emesis/NG output     Other 400 (0.3)    Total Output 1420 410   Net +1972.8 +711          PHYSICAL EXAMINATION: General: Young male, in NAD. Neuro: sedate on vent rass -3 HEENT:  PERRL, sclerae anicteric.  Micrognathia, unable to see  JVD Cardiovascular: RRR, no M/R/G.  Lungs: reduced anterior clear Abdomen: BS x 4, soft, NT/ND.  Musculoskeletal: R club foot, R hemiatrophy. Skin: Intact, warm, no rashes.  LABS:  CBC  Recent Labs Lab 04/04/14 0314 04/05/14 0230 04/06/14 0222  WBC 11.6* 9.7 10.6*  HGB 13.9 13.0 13.6  HCT 46.5 43.9 47.5  PLT 241 238 244   Coag's No results for input(s): APTT, INR in the last 168 hours.   BMET  Recent Labs Lab 04/04/14 0314 04/05/14 0230 04/06/14 0222  NA 151* 150* 151*  K 4.7 4.5 4.5  CL 112 113* 112  CO2 29 31 32  BUN 42* 28* 24*  CREATININE 1.41* 1.12 1.05  GLUCOSE 118* 114* 152*   Electrolytes  Recent Labs Lab 04/01/14 0236 04/02/14 0240 04/03/14 0313 04/04/14 0314 04/05/14 0230 04/06/14 0222  CALCIUM 8.7 8.9 8.7 8.4 8.3* 8.7  MG 2.0 2.3 2.6*  --   --   --   PHOS 3.1 2.8 6.2*  --   --   --    Sepsis Markers  Recent Labs Lab 03/31/14 1837  LATICACIDVEN 1.27   ABG  Recent Labs Lab 04/02/14 0313 04/03/14 0500 04/04/14 0510  PHART 7.464* 7.322* 7.328*  PCO2ART 40.9 57.5*  56.6*  PO2ART 115.0* 77.4* 63.5*   Liver Enzymes  Recent Labs Lab 04/06/14 0222  AST 55*  ALT 30  ALKPHOS 48  BILITOT 0.6  ALBUMIN 3.2*     Cardiac Enzymes No results for input(s): TROPONINI, PROBNP in the last 168 hours.   Glucose  Recent Labs Lab 04/05/14 1549 04/05/14 1908 04/05/14 2330 04/06/14 0355 04/06/14 0751 04/06/14 1158  GLUCAP 117* 102* 163* 91 98 109*    Imaging Dg Chest Port 1 View  04/06/2014   CLINICAL DATA:  Pneumonia.  EXAM: PORTABLE CHEST - 1 VIEW  COMPARISON:  04/05/2014.  FINDINGS: Endotracheal tube and NG tube in stable position. Persistent severe cardiomegaly. Interim slight clearing of bilateral pulmonary infiltrates consistent with partial clearing of pulmonary edema and/or pneumonia. No pleural effusion or pneumothorax. Prior cervicothoracic and thoracolumbar spine fusion.  IMPRESSION: 1. Lines and tubes in stable position. 2.  Persistent severe cardiomegaly with interim partial clearing of bilateral pulmonary infiltrates consistent with partial clearing of pulmonary edema and/or pneumonia.   Electronically Signed   By: Maisie Fus  Register   On: 04/06/2014 07:34   Dg Chest Port 1 View  04/05/2014   CLINICAL DATA:  Acute respiratory failure, shortness of breath  EXAM: PORTABLE CHEST - 1 VIEW  COMPARISON:  Portable chest x-ray of April 04, 2014  FINDINGS: The lung volumes remain low. The cardiac silhouette remains enlarged. The retrocardiac region remains dense and the left hemidiaphragm obscured. Somewhat increased interstitial density in the right mid and lower lung has developed. The endotracheal tube tip lies 6.7 cm above the carina. The esophagogastric tube tip projects below the inferior margin of the image. The proximal port is at the level of the GE junction. The Harrington rods are unchanged in appearance.  IMPRESSION: There has been slight interval deterioration in the appearance of the pulmonary interstitium consistent with pulmonary interstitial edema with bibasilar atelectasis or pneumonia.   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 P:  Echo reviewed, EF wnl tele  RENAL A:   AKI, hypernatremia P:   Add free water  Add d5w Chem in am   GASTROINTESTINAL A:   GI prophylaxis Nutrition constipation P:   SUP: Pantoprazole. TF per nutrition. Npo for trach , ent Add miralax,  colace  HEMATOLOGIC A:   VTE Prophylaxis P:  SCD's / Heparin. Trend CBC   INFECTIOUS A:   Possible CAP - question of prior infiltrate on CXR.   P:   Rocephin, start date 2/25>>>2/29 ceftaz 2/29>>> Azithro, start date 2/25>>>2/29 Keep same abx regimen And follow pcxr   ENDOCRINE A:   No known issues P:   SSI if glucose consistently > 180.  NEUROLOGIC A:   Acute metabolic encephalopathy Hx Insomnia, Tourette's syndrome, tic disorder, scoliosis s/p surgical fixation, right hemiatrophy, right club foot P:   Continue propofol with WUA Fentanyl drip for pain  RASS goal: -1. Hold outpatient clonidine and dexmethylphenidate Dc  adderall 5 mg PO daily. Maintain Risperdal atempt some reduction prop   Family updated: updated family 2/29  Interdisciplinary Family Meeting v Palliative Care Meeting:  Due by: 3/1.  Ccm time 30 min   Mcarthur Rossettianiel J. Tyson AliasFeinstein, MD, FACP Pgr: [phone] [facility]

## 2014-04-06 NOTE — Plan of Care (Signed)
Problem: Phase I Progression Outcomes Goal: VTE prophylaxis Outcome: Completed/Met Date Met:  04/06/14 SCD and Heparin SQ Goal: GIProphysixis Outcome: Completed/Met Date Met:  04/06/14 Protonix Goal: Pneumonia/flu vaccination screen completed Outcome: Completed/Met Date Met:  04/06/14 Will discuss indication for PNA vaccine with MD today 04/06/14 Goal: Pain controlled with appropriate interventions Outcome: Progressing Difficult to sedate during this critical illness. Goal: Patient tolerating weaning plan Outcome: Not Progressing Agitation and copious oral secretions are barriers Goal: Optimized method of communication. Outcome: Not Progressing Unable to wake patient to state to follow commands related to severe agitation

## 2014-04-06 NOTE — Consult Note (Signed)
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  Required intubation.  Requiring substantial sedation for agitation.    Had tracheostomy in  Infancy.  Had scoliosis repair.    ENT called for assistance with surgical tracheostomy.  CT angiogram shows no clear evidence of PE. On my review, the tracheal caliber appears nl.  Innominate artery just below sternal notch.  Possible small residual thyroid gland.  PMH: Past Medical History  Diagnosis Date  . Tourette's syndrome   . Tic disorder   . Scoliosis   . Hemiatrophy of right leg   . Club foot     Right    Surg Hx: Past Surgical History  Procedure Laterality Date  . Foot surgery Right 2002  . Eye muscle surgery Bilateral 1998 and 2002  . Femoral hernia repair      Done when he was an infant  . Harrington rod surgery  2003    For Scoliosis    FHx:   Family History  Problem Relation Age of Onset  . Breast cancer Maternal Grandmother     Died in her mid 51's  . Prostate cancer Maternal Grandfather     Died in his 29's  . Hypertension     SocHx:  reports that he has never smoked. He has never used smokeless tobacco. He reports that he does not drink alcohol or use illicit drugs.  ALLERGIES: No Known Allergies  Medications Prior to Admission  Medication Sig Dispense Refill  . amphetamine-dextroamphetamine (ADDERALL) 10 MG tablet Take 5 mg by mouth daily with breakfast.    . cloNIDine (CATAPRES) 0.1 MG tablet TAKE 3 TABLETS BY MOUTH AT BEDTIME 270 tablet 0  . Dexmethylphenidate HCl 35 MG CP24 Take 1 capsule every morning 30 capsule 0    Results for orders placed or performed during the hospital encounter of 03/31/14 (from the past 48 hour(s))  Glucose, capillary     Status: Abnormal   Collection Time: 04/04/14 11:23 AM  Result Value Ref Range   Glucose-Capillary 112 (H) 70 - 99 mg/dL   Glucose, capillary     Status: Abnormal   Collection Time: 04/04/14  3:07 PM  Result Value Ref Range   Glucose-Capillary 102 (H) 70 - 99 mg/dL  Glucose, capillary     Status: None   Collection Time: 04/04/14  7:38 PM  Result Value Ref Range   Glucose-Capillary 90 70 - 99 mg/dL  Glucose, capillary     Status: Abnormal   Collection Time: 04/05/14 12:03 AM  Result Value Ref Range   Glucose-Capillary 160 (H) 70 - 99 mg/dL   Comment 1 Notify RN   Basic metabolic panel     Status: Abnormal   Collection Time: 04/05/14  2:30 AM  Result Value Ref Range   Sodium 150 (H) 135 - 145 mmol/L   Potassium 4.5 3.5 - 5.1 mmol/L   Chloride 113 (H) 96 - 112 mmol/L   CO2 31 19 - 32 mmol/L   Glucose, Bld 114 (H) 70 - 99 mg/dL   BUN 28 (H) 6 - 23 mg/dL   Creatinine, Ser 1.12 0.50 - 1.35 mg/dL   Calcium 8.3 (L) 8.4 - 10.5 mg/dL   GFR calc non Af Amer >90 >90 mL/min   GFR calc Af Amer >90 >90 mL/min    Comment: (NOTE) The eGFR has been calculated using  the CKD EPI equation. This calculation has not been validated in all clinical situations. eGFR's persistently <90 mL/min signify possible Chronic Kidney Disease.    Anion gap 6 5 - 15  CBC     Status: Abnormal   Collection Time: 04/05/14  2:30 AM  Result Value Ref Range   WBC 9.7 4.0 - 10.5 K/uL   RBC 4.56 4.22 - 5.81 MIL/uL   Hemoglobin 13.0 13.0 - 17.0 g/dL   HCT 43.9 39.0 - 52.0 %   MCV 96.3 78.0 - 100.0 fL   MCH 28.5 26.0 - 34.0 pg   MCHC 29.6 (L) 30.0 - 36.0 g/dL   RDW 14.7 11.5 - 15.5 %   Platelets 238 150 - 400 K/uL  Glucose, capillary     Status: None   Collection Time: 04/05/14  4:06 AM  Result Value Ref Range   Glucose-Capillary 87 70 - 99 mg/dL  Glucose, capillary     Status: Abnormal   Collection Time: 04/05/14  7:51 AM  Result Value Ref Range   Glucose-Capillary 108 (H) 70 - 99 mg/dL  Culture, respiratory (NON-Expectorated)     Status: None (Preliminary result)   Collection Time: 04/05/14 12:55 PM  Result Value Ref Range    Specimen Description SPUTUM    Special Requests ET TUBE    Gram Stain      ABUNDANT WBC PRESENT,BOTH PMN AND MONONUCLEAR NO SQUAMOUS EPITHELIAL CELLS SEEN NO ORGANISMS SEEN Performed at [facility]    Culture PENDING    Report Status PENDING   Glucose, capillary     Status: Abnormal   Collection Time: 04/05/14  1:48 PM  Result Value Ref Range   Glucose-Capillary 122 (H) 70 - 99 mg/dL  Urinalysis, Routine w reflex microscopic     Status: Abnormal   Collection Time: 04/05/14  2:45 PM  Result Value Ref Range   Color, Urine YELLOW YELLOW   APPearance HAZY (A) CLEAR   Specific Gravity, Urine 1.027 1.005 - 1.030   pH 5.5 5.0 - 8.0   Glucose, UA NEGATIVE NEGATIVE mg/dL   Hgb urine dipstick LARGE (A) NEGATIVE   Bilirubin Urine NEGATIVE NEGATIVE   Ketones, ur NEGATIVE NEGATIVE mg/dL   Protein, ur NEGATIVE NEGATIVE mg/dL   Urobilinogen, UA 0.2 0.0 - 1.0 mg/dL   Nitrite NEGATIVE NEGATIVE   Leukocytes, UA NEGATIVE NEGATIVE  Urine microscopic-add on     Status: Abnormal   Collection Time: 04/05/14  2:45 PM  Result Value Ref Range   Squamous Epithelial / LPF RARE RARE   WBC, UA 0-2 <3 WBC/hpf   RBC / HPF TOO NUMEROUS TO COUNT <3 RBC/hpf   Bacteria, UA FEW (A) RARE   Crystals URIC ACID CRYSTALS (A) NEGATIVE   Urine-Other MUCOUS PRESENT   Glucose, capillary     Status: Abnormal   Collection Time: 04/05/14  3:49 PM  Result Value Ref Range   Glucose-Capillary 117 (H) 70 - 99 mg/dL  Glucose, capillary     Status: Abnormal   Collection Time: 04/05/14  7:08 PM  Result Value Ref Range   Glucose-Capillary 102 (H) 70 - 99 mg/dL  Glucose, capillary     Status: Abnormal   Collection Time: 04/05/14 11:30 PM  Result Value Ref Range   Glucose-Capillary 163 (H) 70 - 99 mg/dL   Comment 1 Notify RN   Comprehensive metabolic panel     Status: Abnormal   Collection Time: 04/06/14  2:22 AM  Result Value Ref Range   Sodium 151 (  H) 135 - 145 mmol/L   Potassium 4.5 3.5 - 5.1 mmol/L    Chloride 112 96 - 112 mmol/L   CO2 32 19 - 32 mmol/L   Glucose, Bld 152 (H) 70 - 99 mg/dL   BUN 24 (H) 6 - 23 mg/dL   Creatinine, Ser 1.05 0.50 - 1.35 mg/dL   Calcium 8.7 8.4 - 10.5 mg/dL   Total Protein 6.7 6.0 - 8.3 g/dL   Albumin 3.2 (L) 3.5 - 5.2 g/dL   AST 55 (H) 0 - 37 U/L   ALT 30 0 - 53 U/L   Alkaline Phosphatase 48 39 - 117 U/L   Total Bilirubin 0.6 0.3 - 1.2 mg/dL   GFR calc non Af Amer >90 >90 mL/min   GFR calc Af Amer >90 >90 mL/min    Comment: (NOTE) The eGFR has been calculated using the CKD EPI equation. This calculation has not been validated in all clinical situations. eGFR's persistently <90 mL/min signify possible Chronic Kidney Disease.    Anion gap 7 5 - 15  CBC with Differential/Platelet     Status: Abnormal   Collection Time: 04/06/14  2:22 AM  Result Value Ref Range   WBC 10.6 (H) 4.0 - 10.5 K/uL   RBC 4.77 4.22 - 5.81 MIL/uL   Hemoglobin 13.6 13.0 - 17.0 g/dL   HCT 47.5 39.0 - 52.0 %   MCV 99.6 78.0 - 100.0 fL   MCH 28.5 26.0 - 34.0 pg   MCHC 28.6 (L) 30.0 - 36.0 g/dL   RDW 14.4 11.5 - 15.5 %   Platelets 244 150 - 400 K/uL   Neutrophils Relative % 69 43 - 77 %   Lymphocytes Relative 13 12 - 46 %   Monocytes Relative 16 (H) 3 - 12 %   Eosinophils Relative 2 0 - 5 %   Basophils Relative 0 0 - 1 %   Neutro Abs 7.3 1.7 - 7.7 K/uL   Lymphs Abs 1.4 0.7 - 4.0 K/uL   Monocytes Absolute 1.7 (H) 0.1 - 1.0 K/uL   Eosinophils Absolute 0.2 0.0 - 0.7 K/uL   Basophils Absolute 0.0 0.0 - 0.1 K/uL   WBC Morphology ATYPICAL LYMPHOCYTES   Triglycerides     Status: Abnormal   Collection Time: 04/06/14  2:22 AM  Result Value Ref Range   Triglycerides 174 (H) <150 mg/dL  Glucose, capillary     Status: None   Collection Time: 04/06/14  3:55 AM  Result Value Ref Range   Glucose-Capillary 91 70 - 99 mg/dL  Glucose, capillary     Status: None   Collection Time: 04/06/14  7:51 AM  Result Value Ref Range   Glucose-Capillary 98 70 - 99 mg/dL   Dg Chest Port 1  View  04/06/2014   CLINICAL DATA:  Pneumonia.  EXAM: PORTABLE CHEST - 1 VIEW  COMPARISON:  04/05/2014.  FINDINGS: Endotracheal tube and NG tube in stable position. Persistent severe cardiomegaly. Interim slight clearing of bilateral pulmonary infiltrates consistent with partial clearing of pulmonary edema and/or pneumonia. No pleural effusion or pneumothorax. Prior cervicothoracic and thoracolumbar spine fusion.  IMPRESSION: 1. Lines and tubes in stable position. 2. Persistent severe cardiomegaly with interim partial clearing of bilateral pulmonary infiltrates consistent with partial clearing of pulmonary edema and/or pneumonia.   Electronically Signed   By: Marcello Moores  Register   On: 04/06/2014 07:34   Dg Chest Port 1 View  04/05/2014   CLINICAL DATA:  Acute respiratory failure, shortness of breath  EXAM:  PORTABLE CHEST - 1 VIEW  COMPARISON:  Portable chest x-ray of April 04, 2014  FINDINGS: The lung volumes remain low. The cardiac silhouette remains enlarged. The retrocardiac region remains dense and the left hemidiaphragm obscured. Somewhat increased interstitial density in the right mid and lower lung has developed. The endotracheal tube tip lies 6.7 cm above the carina. The esophagogastric tube tip projects below the inferior margin of the image. The proximal port is at the level of the GE junction. The Harrington rods are unchanged in appearance.  IMPRESSION: There has been slight interval deterioration in the appearance of the pulmonary interstitium consistent with pulmonary interstitial edema with bibasilar atelectasis or pneumonia.   Electronically Signed   By: David  [place]   On: 04/05/2014 07:17    Blood pressure 140/74, pulse 115, temperature 100.7 F (38.2 C), temperature source Core (Comment), resp. rate 20, height $RemoveBe'4\' 10"'[id number]$  (1.473 m), weight 66.5 kg (146 lb 9.7 oz), SpO2 100 %.  PHYSICAL EXAM: Overall appearance:  Sedated.  OT tube in position. Head:  NCAT Ears:  Not examined Nose:  Not  examined Oral Cavity:  Not examined Oral Pharynx/Hypopharynx/Larynx:  Not examined Neuro: could not assess Neck: poor extension.  Deep scar above sternal notch c/w  Prior tracheostomy.  Cricoid cartilage palpable above sternal notch.    Studies Reviewed:  CXR, CT chest    Assessment/Plan Prolonged intubation.  Anticipate difficult airway management and further duration of need.    Concerned about poor neck extension, and previous tracheostomy.  Standard tracheostomy tube may reach to carina.  May need adjustable length tube.  Trach flange may also cause soft tissue impingement and erosion externally.  Discussed with Dr. Titus Mould.  Will discuss with family as desired.    On schedule tomorrow AM.  Orders written.    Jodi Marble 02/09/5206, 9:36 AM

## 2014-04-07 ENCOUNTER — Inpatient Hospital Stay (HOSPITAL_COMMUNITY): Payer: Medicaid Other

## 2014-04-07 ENCOUNTER — Encounter (HOSPITAL_COMMUNITY): Payer: Self-pay | Admitting: Certified Registered Nurse Anesthetist

## 2014-04-07 ENCOUNTER — Encounter (HOSPITAL_COMMUNITY)
Admission: EM | Disposition: A | Discharge status: Home / Self Care | Payer: Self-pay | Source: Home / Self Care | Attending: Internal Medicine

## 2014-04-07 ENCOUNTER — Inpatient Hospital Stay (HOSPITAL_COMMUNITY): Payer: Medicaid Other | Admitting: Anesthesiology

## 2014-04-07 DIAGNOSIS — J989 Respiratory disorder, unspecified: Secondary | ICD-10-CM

## 2014-04-07 DIAGNOSIS — R069 Unspecified abnormalities of breathing: Secondary | ICD-10-CM | POA: Diagnosis present

## 2014-04-07 DIAGNOSIS — J189 Pneumonia, unspecified organism: Secondary | ICD-10-CM | POA: Diagnosis present

## 2014-04-07 HISTORY — PX: TRACHEOSTOMY TUBE PLACEMENT: SHX814

## 2014-04-07 LAB — POCT I-STAT 3, ART BLOOD GAS (G3+)
Acid-Base Excess: 6 mmol/L — ABNORMAL HIGH (ref 0.0–2.0)
Bicarbonate: 34.5 mEq/L — ABNORMAL HIGH (ref 20.0–24.0)
O2 Saturation: 89 %
PCO2 ART: 69.9 mmHg — AB (ref 35.0–45.0)
TCO2: 37 mmol/L (ref 0–100)
pH, Arterial: 7.301 — ABNORMAL LOW (ref 7.350–7.450)
pO2, Arterial: 64 mmHg — ABNORMAL LOW (ref 80.0–100.0)

## 2014-04-07 LAB — CBC WITH DIFFERENTIAL/PLATELET
BASOS ABS: 0 10*3/uL (ref 0.0–0.1)
Basophils Relative: 0 % (ref 0–1)
EOS PCT: 1 % (ref 0–5)
Eosinophils Absolute: 0.1 10*3/uL (ref 0.0–0.7)
HCT: 50.4 % (ref 39.0–52.0)
Hemoglobin: 14.8 g/dL (ref 13.0–17.0)
LYMPHS ABS: 1.1 10*3/uL (ref 0.7–4.0)
Lymphocytes Relative: 11 % — ABNORMAL LOW (ref 12–46)
MCH: 29.3 pg (ref 26.0–34.0)
MCHC: 29.4 g/dL — ABNORMAL LOW (ref 30.0–36.0)
MCV: 99.8 fL (ref 78.0–100.0)
MONO ABS: 1 10*3/uL (ref 0.1–1.0)
Monocytes Relative: 10 % (ref 3–12)
NEUTROS PCT: 78 % — AB (ref 43–77)
Neutro Abs: 7.7 10*3/uL (ref 1.7–7.7)
PLATELETS: 230 10*3/uL (ref 150–400)
RBC: 5.05 MIL/uL (ref 4.22–5.81)
RDW: 14.5 % (ref 11.5–15.5)
WBC: 9.9 10*3/uL (ref 4.0–10.5)

## 2014-04-07 LAB — BASIC METABOLIC PANEL
ANION GAP: 9 (ref 5–15)
BUN: 17 mg/dL (ref 6–23)
CO2: 31 mmol/L (ref 19–32)
Calcium: 8.7 mg/dL (ref 8.4–10.5)
Chloride: 105 mmol/L (ref 96–112)
Creatinine, Ser: 0.83 mg/dL (ref 0.50–1.35)
GFR calc Af Amer: >90 mL/min (ref >90)
GLUCOSE: 161 mg/dL — AB (ref 70–99)
POTASSIUM: 4.6 mmol/L (ref 3.5–5.1)
SODIUM: 145 mmol/L (ref 135–145)

## 2014-04-07 LAB — GLUCOSE, CAPILLARY
GLUCOSE-CAPILLARY: 120 mg/dL — AB (ref 70–99)
GLUCOSE-CAPILLARY: 87 mg/dL (ref 70–99)
GLUCOSE-CAPILLARY: 87 mg/dL (ref 70–99)
Glucose-Capillary: 101 mg/dL — ABNORMAL HIGH (ref 70–99)
Glucose-Capillary: 104 mg/dL — ABNORMAL HIGH (ref 70–99)
Glucose-Capillary: 117 mg/dL — ABNORMAL HIGH (ref 70–99)

## 2014-04-07 LAB — POCT I-STAT 7, (LYTES, BLD GAS, ICA,H+H)
ACID-BASE EXCESS: 8 mmol/L — AB (ref 0.0–2.0)
BICARBONATE: 33.4 meq/L — AB (ref 20.0–24.0)
Calcium, Ion: 1.11 mmol/L — ABNORMAL LOW (ref 1.12–1.23)
HCT: 43 % (ref 39.0–52.0)
Hemoglobin: 14.6 g/dL (ref 13.0–17.0)
O2 SAT: 100 %
PO2 ART: 251 mmHg — AB (ref 80.0–100.0)
Patient temperature: 35
Potassium: 3.4 mmol/L — ABNORMAL LOW (ref 3.5–5.1)
Sodium: 144 mmol/L (ref 135–145)
TCO2: 35 mmol/L (ref 0–100)
pCO2 arterial: 43.1 mmHg (ref 35.0–45.0)
pH, Arterial: 7.489 — ABNORMAL HIGH (ref 7.350–7.450)

## 2014-04-07 LAB — CULTURE, RESPIRATORY W GRAM STAIN

## 2014-04-07 LAB — CULTURE, RESPIRATORY: CULTURE: NO GROWTH

## 2014-04-07 LAB — CULTURE, BLOOD (ROUTINE X 2)
CULTURE: NO GROWTH
Culture: NO GROWTH

## 2014-04-07 SURGERY — CREATION, TRACHEOSTOMY
Anesthesia: General | Site: Neck

## 2014-04-07 MED ORDER — 0.9 % SODIUM CHLORIDE (POUR BTL) OPTIME
TOPICAL | Status: DC | PRN
  Administered 2014-04-07: 1000 mL

## 2014-04-07 MED ORDER — FENTANYL CITRATE 0.05 MG/ML IJ SOLN
INTRAMUSCULAR | Status: AC
  Filled 2014-04-07: qty 5

## 2014-04-07 MED ORDER — MIDAZOLAM HCL 2 MG/2ML IJ SOLN
INTRAMUSCULAR | Status: AC
  Filled 2014-04-07: qty 2

## 2014-04-07 MED ORDER — ROCURONIUM BROMIDE 50 MG/5ML IV SOLN
INTRAVENOUS | Status: AC
  Filled 2014-04-07: qty 1

## 2014-04-07 MED ORDER — CLONIDINE HCL 0.3 MG PO TABS
0.3000 mg | ORAL_TABLET | Freq: Every day | ORAL | Status: DC
  Administered 2014-04-07 – 2014-04-08 (×2): 0.3 mg via ORAL
  Filled 2014-04-07 (×3): qty 1

## 2014-04-07 MED ORDER — HEPARIN SODIUM (PORCINE) 5000 UNIT/ML IJ SOLN: 5000.0000 [IU] | Freq: Three times a day (TID) | INTRAMUSCULAR | Status: DC

## 2014-04-07 MED ORDER — LIDOCAINE HCL (CARDIAC) 20 MG/ML IV SOLN
INTRAVENOUS | Status: AC
  Filled 2014-04-07: qty 5

## 2014-04-07 MED ORDER — LIDOCAINE-EPINEPHRINE 1 %-1:100000 IJ SOLN
INTRAMUSCULAR | Status: AC
  Filled 2014-04-07: qty 1

## 2014-04-07 MED ORDER — POTASSIUM CHLORIDE 20 MEQ/15ML (10%) PO SOLN
40.0000 meq | Freq: Once | ORAL | Status: AC
Start: 2014-04-07 — End: 2014-04-07
  Administered 2014-04-07: 40 meq
  Filled 2014-04-07: qty 30

## 2014-04-07 MED ORDER — PROPOFOL 10 MG/ML IV BOLUS
INTRAVENOUS | Status: AC
  Filled 2014-04-07: qty 20

## 2014-04-07 MED ORDER — ARTIFICIAL TEARS OP OINT
TOPICAL_OINTMENT | OPHTHALMIC | Status: AC
  Filled 2014-04-07: qty 3.5

## 2014-04-07 MED ORDER — RISPERIDONE 1 MG/ML PO SOLN
2.0000 mg | Freq: Two times a day (BID) | ORAL | Status: DC
  Administered 2014-04-07 – 2014-04-08 (×4): 2 mg
  Filled 2014-04-07 (×7): qty 2

## 2014-04-07 MED ORDER — ROCURONIUM BROMIDE 100 MG/10ML IV SOLN
INTRAVENOUS | Status: DC | PRN
  Administered 2014-04-07: 30 mg via INTRAVENOUS

## 2014-04-07 MED ORDER — BISACODYL 10 MG RE SUPP
10.0000 mg | Freq: Every day | RECTAL | Status: DC | PRN
  Filled 2014-04-07: qty 1

## 2014-04-07 MED ORDER — LACTATED RINGERS IV SOLN
INTRAVENOUS | Status: DC | PRN
  Administered 2014-04-07: 10:00:00 via INTRAVENOUS

## 2014-04-07 MED ORDER — FENTANYL CITRATE 0.05 MG/ML IJ SOLN
INTRAMUSCULAR | Status: DC | PRN
  Administered 2014-04-07 (×3): 50 ug via INTRAVENOUS

## 2014-04-07 MED ORDER — AMPHETAMINE-DEXTROAMPHETAMINE 10 MG PO TABS: 5.0000 mg | ORAL_TABLET | Freq: Every day | ORAL | Status: DC

## 2014-04-07 MED ORDER — FENTANYL 50 MCG/HR TD PT72
75.0000 ug | MEDICATED_PATCH | TRANSDERMAL | Status: DC
  Administered 2014-04-07 – 2014-04-19 (×5): 75 ug via TRANSDERMAL
  Filled 2014-04-07 (×9): qty 1

## 2014-04-07 MED ORDER — MIDAZOLAM HCL 5 MG/5ML IJ SOLN
INTRAMUSCULAR | Status: DC | PRN
  Administered 2014-04-07: 2 mg via INTRAVENOUS

## 2014-04-07 MED ORDER — LIDOCAINE-EPINEPHRINE 1 %-1:100000 IJ SOLN
INTRAMUSCULAR | Status: DC | PRN
  Administered 2014-04-07: 10 mL

## 2014-04-07 SURGICAL SUPPLY — 34 items
BLADE SURG ROTATE 9660 (MISCELLANEOUS) IMPLANT
CANISTER SUCTION 2500CC (MISCELLANEOUS) ×3 IMPLANT
CLEANER TIP ELECTROSURG 2X2 (MISCELLANEOUS) ×3 IMPLANT
COVER SURGICAL LIGHT HANDLE (MISCELLANEOUS) ×3 IMPLANT
CRADLE DONUT ADULT HEAD (MISCELLANEOUS) IMPLANT
DECANTER SPIKE VIAL GLASS SM (MISCELLANEOUS) ×3 IMPLANT
ELECT COATED BLADE 2.86 ST (ELECTRODE) ×3 IMPLANT
ELECT REM PT RETURN 9FT ADLT (ELECTROSURGICAL) ×3
ELECTRODE REM PT RTRN 9FT ADLT (ELECTROSURGICAL) ×1 IMPLANT
GLOVE BIOGEL PI IND STRL 6.5 (GLOVE) IMPLANT
GLOVE BIOGEL PI INDICATOR 6.5 (GLOVE) ×2
GLOVE ECLIPSE 6.5 STRL STRAW (GLOVE) ×2 IMPLANT
GLOVE ECLIPSE 8.0 STRL XLNG CF (GLOVE) ×6 IMPLANT
GOWN STRL REUS W/ TWL LRG LVL3 (GOWN DISPOSABLE) ×1 IMPLANT
GOWN STRL REUS W/ TWL XL LVL3 (GOWN DISPOSABLE) ×1 IMPLANT
GOWN STRL REUS W/TWL LRG LVL3 (GOWN DISPOSABLE) ×3
GOWN STRL REUS W/TWL XL LVL3 (GOWN DISPOSABLE) ×3
KIT BASIN OR (CUSTOM PROCEDURE TRAY) ×3 IMPLANT
KIT ROOM TURNOVER OR (KITS) ×3 IMPLANT
NDL HYPO 25GX1X1/2 BEV (NEEDLE) IMPLANT
NEEDLE HYPO 25GX1X1/2 BEV (NEEDLE) ×3 IMPLANT
NS IRRIG 1000ML POUR BTL (IV SOLUTION) ×3 IMPLANT
PAD ARMBOARD 7.5X6 YLW CONV (MISCELLANEOUS) ×6 IMPLANT
PENCIL BUTTON HOLSTER BLD 10FT (ELECTRODE) ×3 IMPLANT
SPONGE DRAIN TRACH 4X4 STRL 2S (GAUZE/BANDAGES/DRESSINGS) ×3 IMPLANT
SUT CHROMIC 2 0 SH (SUTURE) ×3 IMPLANT
SUT ETHILON 2 0 FS 18 (SUTURE) ×2 IMPLANT
SUT SILK 2 0 SH CR/8 (SUTURE) ×3 IMPLANT
SYRINGE 10CC LL (SYRINGE) ×2 IMPLANT
TOWEL OR 17X24 6PK STRL BLUE (TOWEL DISPOSABLE) ×3 IMPLANT
TOWEL OR 17X26 10 PK STRL BLUE (TOWEL DISPOSABLE) ×3 IMPLANT
TRAY ENT MC OR (CUSTOM PROCEDURE TRAY) ×3 IMPLANT
TUBE TRACH SHILEY  6 DIST  CUF (TUBING) ×2 IMPLANT
WATER STERILE IRR 1000ML POUR (IV SOLUTION) ×3 IMPLANT

## 2014-04-07 NOTE — OR Nursing (Signed)
Ulis Rias McKeown Rn  In icu reported patient has no allergies and has been NPO since 12 midnight

## 2014-04-07 NOTE — Transfer of Care (Signed)
Immediate Anesthesia Transfer of Care Note  Patient: [name]  Procedure(s) Performed: Procedure(s): TRACHEOSTOMY (N/A)  Patient Location: PACU  Anesthesia Type:General  Level of Consciousness: sedated  Airway & Oxygen Therapy: Patient placed on Ventilator (see vital sign flow sheet for setting), s/p trach placement  Post-op Assessment: Report given to RN and Post -op Vital signs reviewed and stable  Post vital signs: Reviewed and stable  Last Vitals:  Filed Vitals:   04/07/14 0600  BP: 113/60  Pulse: 84  Temp: 36.8 C  Resp: 20    Complications: No apparent anesthesia complications

## 2014-04-07 NOTE — Anesthesia Postprocedure Evaluation (Signed)
Anesthesia Post Note  Patient: [name]  Procedure(s) Performed: Procedure(s) (LRB): TRACHEOSTOMY (N/A)  Anesthesia type: general  Patient location: PACU  Post pain: Pain level controlled  Post assessment: Patient's Cardiovascular Status Stable  Last Vitals:  Filed Vitals:   04/07/14 1300  BP: 150/87  Pulse: 89  Temp: 37.7 C  Resp: 27    Post vital signs: Reviewed and stable  Level of consciousness: sedated  Complications: No apparent anesthesia complications

## 2014-04-07 NOTE — Progress Notes (Signed)
PULMONARY / CRITICAL CARE MEDICINE   Name: [name] MRN: [id number] DOB: 07/29/1990    ADMISSION DATE:  03/31/2014 CONSULTATION DATE:  04/07/2014  REFERRING MD :  EDP  CHIEF COMPLAINT:  SOB  INITIAL PRESENTATION:  24 y.o. M [facility] 2/24 with SOB.  In ED, he was hypoxic with sats in 60's.  He was placed on BiPAP but repeat ABG 3 hours later showed progressive respiratory decline.  Given his hx of cervical fusion, micrognathia, prior trach, etc. (which all predispose him to difficult airway), he was intubated prior to ICU admission.  STUDIES:  CTA chest 2/24 >>> negative for PE, limited study due to motion.  SIGNIFICANT EVENTS: 2/24  Admit 2/27  Awake, follows commands on fent / precedex  2/28  Remains on PRVC, agitation / sedation issues.  Weaned briefly on 15/5 3/02  Trach planned per ENT    SUBJECTIVE: No acute events overnight.  Planned trach per ENT.  Remains on propofol / fentanyl gtt   VITAL SIGNS: Temp:  [98.2 F (36.8 C)-102.1 F (38.9 C)] 98.3 F (36.8 C) (03/02 0600) Pulse Rate:  [79-109] 84 (03/02 0600) Resp:  [19-23] 20 (03/02 0600) BP: (103-141)/(53-78) 113/60 mmHg (03/02 0600) SpO2:  [97 %-100 %] 97 % (03/02 0600) FiO2 (%):  [40 %] 40 % (03/02 0600) Weight:  [147 lb 11.3 oz (67 kg)] 147 lb 11.3 oz (67 kg) (03/02 0322)   HEMODYNAMICS:     VENTILATOR SETTINGS: Vent Mode:  [-] PRVC FiO2 (%):  [40 %] 40 % Set Rate:  [20 bmp] 20 bmp Vt Set:  [320 mL] 320 mL PEEP:  [5 cmH20] 5 cmH20 Plateau Pressure:  [24 cmH20-26 cmH20] 26 cmH20   INTAKE / OUTPUT: Intake/Output      03/01 0701 - 03/02 0700 03/02 0701 - 03/03 0700   I.V. (mL/kg) 1998 (29.8)    NG/GT 1230    IV Piggyback 400    Total Intake(mL/kg) 3628 (54.1)    Urine (mL/kg/hr) 1095 (0.7)    Other     Total Output 1095     Net +2533            PHYSICAL EXAMINATION: General: Young male, in NAD on vent Neuro: sedate on vent rass -3 HEENT:  PERRL, sclerae anicteric.  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  Lungs: even/non-labored, diminished breath sounds but clear Abdomen: BS x 4, soft, NT/ND.  Musculoskeletal: R club foot, R hemiatrophy. Skin: Intact, warm, no rashes.  LABS:  CBC  Recent Labs Lab 04/05/14 0230 04/06/14 0222 04/07/14 0320  WBC 9.7 10.6* 9.9  HGB 13.0 13.6 14.8  HCT 43.9 47.5 50.4  PLT 238 244 230   BMET  Recent Labs Lab 04/05/14 0230 04/06/14 0222 04/07/14 0320  NA 150* 151* 145  K 4.5 4.5 4.6  CL 113* 112 105  CO2 31 32 31  BUN 28* 24* 17  CREATININE 1.12 1.05 0.83  GLUCOSE 114* 152* 161*   Electrolytes  Recent Labs Lab 04/01/14 0236 04/02/14 0240 04/03/14 0313  04/05/14 0230 04/06/14 0222 04/07/14 0320  CALCIUM 8.7 8.9 8.7  < > 8.3* 8.7 8.7  MG 2.0 2.3 2.6*  --   --   --   --   PHOS 3.1 2.8 6.2*  --   --   --   --   < > = values in this interval not displayed.   Sepsis Markers  Recent Labs Lab 03/31/14 1837  LATICACIDVEN 1.27  ABG  Recent Labs Lab 04/02/14 0313 04/03/14 0500 04/04/14 0510  PHART 7.464* 7.322* 7.328*  PCO2ART 40.9 57.5* 56.6*  PO2ART 115.0* 77.4* 63.5*   Liver Enzymes  Recent Labs Lab 04/06/14 0222  AST 55*  ALT 30  ALKPHOS 48  BILITOT 0.6  ALBUMIN 3.2*    Glucose  Recent Labs Lab 04/06/14 1158 04/06/14 1537 04/06/14 1928 04/06/14 2352 04/07/14 0413 04/07/14 0830  GLUCAP 109* 120* 102* 104* 120* 101*    Imaging Dg Chest Port 1 View  04/07/2014   CLINICAL DATA:  Pneumonia.  EXAM: PORTABLE CHEST - 1 VIEW  COMPARISON:  04/06/2014.  FINDINGS: Endotracheal tube and NG tube in stable position. Stable cardiomegaly. No pulmonary venous congestion on today's exam. Left lower lobe atelectasis and or infiltrate. No pneumothorax. Prior spinal fusion. Stable deformity of the chest wall.  IMPRESSION: 1. Lines and tubes in stable position. 2. Stable cardiomegaly. No evidence of pulmonary venous congestion on today's exam. 3. Persistent left lower lobe  atelectasis and/or infiltrate. Left lower lobe pneumonia should be considered.   Electronically Signed   By: Maisie Fus  Register   On: 04/07/2014 07:18   Dg Chest Port 1 View  04/06/2014   CLINICAL DATA:  Pneumonia.  EXAM: PORTABLE CHEST - 1 VIEW  COMPARISON:  04/05/2014.  FINDINGS: Endotracheal tube and NG tube in stable position. Persistent severe cardiomegaly. Interim slight clearing of bilateral pulmonary infiltrates consistent with partial clearing of pulmonary edema and/or pneumonia. No pleural effusion or pneumothorax. Prior cervicothoracic and thoracolumbar spine fusion.  IMPRESSION: 1. Lines and tubes in stable position. 2. Persistent severe cardiomegaly with interim partial clearing of bilateral pulmonary infiltrates consistent with partial clearing of pulmonary edema and/or pneumonia.   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 TF per nutrition. NPO for trach per ENT 3/2 Continue miralax, colace - hold for diarrhea May need dulc May need peg  HEMATOLOGIC A:   VTE Prophylaxis P:  SCD's / Heparin. Trend CBC   INFECTIOUS A:   Possible CAP - question of prior infiltrate on CXR.   P:   Rocephin, start date 2/25>>>2/29 Azithro, start date 2/25>>>2/29 Ceftaz  2/29>>>  Abx as above, D7/x.  Consider d/c  Flu neg, dc isolation  ENDOCRINE A:   No known issues P:   SSI if glucose consistently > 180.  NEUROLOGIC A:   Acute metabolic encephalopathy Hx Insomnia, Tourette's syndrome, tic disorder, scoliosis s/p surgical fixation, right hemiatrophy, right club foot P:   Continue propofol  Fentanyl drip for pain  RASS goal: -1. Hold outpatient clonidine, dexmethylphenidate and adderall Risperdal, increase  Family updated: no family at bedside 3/2   Canary Brim, NP-C [facility] Pgr: [phone] or [phone]  STAFF NOTE: I, Rory Percy, MD FACP have personally reviewed patient's available data, including medical history, events of note, physical examination and test results as part of my evaluation. I have discussed with resident/NP and other care providers such as pharmacist, RN and RRT. In addition, I personally evaluated patient and elicited key findings of: just back frmo trach, consider trach collar if sedation needs can be reduced, dc d5w, dc abxm, course completed, need a BM, likley to need peg, dad updated The patient is critically ill with multiple organ systems failure and requires high complexity decision making for assessment and support, frequent evaluation and titration of therapies, application of advanced monitoring technologies and extensive interpretation of multiple databases.   Critical Care Time devoted to patient care services described in this note is30 Minutes. This time reflects time of care of this signee: Rory Percy, MD FACP. This critical care time does not reflect procedure time, or teaching time or supervisory time of PA/NP/Med student/Med Resident etc but could involve care discussion time. Rest per NP/medical resident whose note is outlined above and that I agree with   Mcarthur Rossetti. Tyson Alias, MD, FACP Pgr: [phone] [facility] 04/07/2014 12:15 PM

## 2014-04-07 NOTE — Op Note (Signed)
04/07/2014 11:22 AM  [name],  [name] [id number]  Pre-Op Dx: prolonged intubation.  Klippel Feil syndrome  Post-Op Dx:  same  Proc:  Tracheostomy  Surg:  Flo ShanksWOLICKI, Lennyx Verdell  Anes:  GOT  EBL:  min  Comp:  none  Findings:  Dimpled skin above sternal notch with subcutaneous fibrosis and scarring or anterior tracheal wall.  Trach tube tip above carina by flexible laryngoscopy.    Procedure:  The patient was brought from the intensive care unit to the operating room and transferred to an operating table.  Anesthesia was administered per indwelling orotracheal tube.  The patient was placed in a slight reverse Trendelenburg.  Neck extension was achieved as possible.  The lower neck was palpated with the findings as described above.  1% Xylocaine with 1:100,000 epinephrine, 10 cc's, was infiltrated into the surgical field for intraoperative hemostasis.  Several minutes were allowed for this to take effect.  A Hibiclens sterile preparation  of the lower neck and upper chest was performed in the standard fashion.  Sterile draping was accomplished in the standard fashion.  A  4 cm transverse incision was made sharply above the skin dimple and the skin was elevated from the subcutaneous scar tissue.   Scar tissue was dissected away from the palpable trachea.  Tracheal rings were difficulty to feel.   The pretracheal plane was developed bluntly.    The anterior face of the trachea was cleared.  In the  ?1-2 interspace, a transverse incision was made between cartilage rings into the tracheal lumen.    Mucosal edges were cauterized for hemostasis. The opening was dilated with a trach spreader.  A previously tested  # 6 Shiley cuffed tracheostomy tube was brought into the field.  With the endotracheal tube under direct visualization through the tracheostomy, it was gently backed up.  The tracheostomy tube was inserted into the tracheal lumen.  Hemostasis was observed. The cuff was inflated and observed to be intact  and containing pressure. The inner cannula was placed and ventilation assumed per tracheostomy tube.  Good chest wall motion was observed, and CO2 was documented per anesthesia.  The trach tube was secured in the standard fashion with cotton twill ties.  Hemostasis was observed again.  A trach dressing was placed in the standard fashion.    The flexible laryngoscope was inserted into the tracheostomy tube.  The trach tube tip looked to be 3-4 cm above the carina.      When satisfactory ventilation was assured, the orotracheal tube was removed. The OG tube was also removed and a #16 [place] Sump NG tube was placed bloody stomach contents were identified.   At this point the procedure was completed.  The patient was returned to anesthesia, awakened as possible, and transferred back to the intensive care unit in stable condition.  Comment: 24 y.o. bm with prolonged ventilation was the indication for today's procedure.  Anticipate a routine postoperative recovery including standard tracheal hygiene.  We will change the trach ties at 3 days but not use Velcro ties until seven days.  When the patient no longer requires ventilator or pressure support, the cuff should be deflated.  Change to an uncuffed tube and downsizing will be according to the clinical condition of the patient.

## 2014-04-07 NOTE — Anesthesia Preprocedure Evaluation (Signed)
Anesthesia Evaluation  Patient identified by MRN, date of birth, ID band Patient unresponsive    Reviewed: Allergy & Precautions, Patient's Chart, lab work & pertinent test results, Unable to perform ROS - Chart review only  Airway Mallampati: Intubated       Dental   Pulmonary          Cardiovascular hypertension, Pt. on medications     Neuro/Psych    GI/Hepatic   Endo/Other    Renal/GU      Musculoskeletal   Abdominal   Peds  Hematology   Anesthesia Other Findings   Reproductive/Obstetrics                             Anesthesia Physical Anesthesia Plan  ASA: IV  Anesthesia Plan: General   Post-op Pain Management:    Induction: Intravenous  Airway Management Planned: Oral ETT and Tracheostomy  Additional Equipment:   Intra-op Plan:   Post-operative Plan: Post-operative intubation/ventilation  Informed Consent: I have reviewed the patients History and Physical, chart, labs and discussed the procedure including the risks, benefits and alternatives for the proposed anesthesia with the patient or authorized representative who has indicated his/her understanding and acceptance.     Plan Discussed with: CRNA and Surgeon  Anesthesia Plan Comments:         Anesthesia Quick Evaluation

## 2014-04-07 NOTE — Care Management Note (Signed)
    Page 1 of 2   04/07/2014     2:44:15 PM CARE MANAGEMENT NOTE 04/07/2014  Patient:  [name],[name]   Account Number:  [id number]  Date Initiated:  04/01/2014  Documentation initiated by:  [name],[name]  Subjective/Objective Assessment:   Admitted with resp failure requiring intubation.     Action/Plan:   Anticipated DC Date:  04/07/2014   Anticipated DC Plan:  HOME W HOME HEALTH SERVICES      DC Planning Services  CM consult      Choice offered to / List presented to:             Status of service:  In process, will continue to follow Medicare Important Message given?   (If response is "NO", the following Medicare IM given date fields will be blank) Date Medicare IM given:   Medicare IM given by:   Date Additional Medicare IM given:   Additional Medicare IM given by:    Discharge Disposition:    Per UR Regulation:  Reviewed for med. necessity/level of care/duration of stay  If discussed at Long Length of Stay Meetings, dates discussed:    Comments:  Contact:  [name],[name] Father [phone]      [name],[name] Mother [phone]  [phone]  04-07-14 2:30pm Aaron ArenasSarah Navdeep [name], [name] [phone] Trached today in OR.   Talked with Mom.  Confirmed that he does live with her and is independent - she does work. Patient has been on the vent/oxygen and trach when he was a baby.  She has taken care of trach prior.  Doesn't want to think of him not being liberated from vent and is only thinking at this time of him coming home with a trach which she is comfortable with.  Pateint does have [employer] and has a case worker Ship brokermary Rucker that she feels she could definitely get more services from if needed.   ?? [employer] case if difficulty weaning......Marland Kitchen.  Will follow next week for weaning progress.  CM will continue to follow.   04-05-14 11am [name], [name] [phone] Talked with father and grandmother at bedside.  patient lives at home with his mom and is independent now.  He is actually  talking classes for how to be more independent and live on his own.  Dad states he was doing really well. Unfortunately now continues on vent - failed weaning and talking about traching.

## 2014-04-08 ENCOUNTER — Encounter (HOSPITAL_COMMUNITY): Payer: Self-pay | Admitting: Otolaryngology

## 2014-04-08 ENCOUNTER — Inpatient Hospital Stay (HOSPITAL_COMMUNITY): Payer: Medicaid Other

## 2014-04-08 LAB — GLUCOSE, CAPILLARY
GLUCOSE-CAPILLARY: 100 mg/dL — AB (ref 70–99)
GLUCOSE-CAPILLARY: 126 mg/dL — AB (ref 70–99)
GLUCOSE-CAPILLARY: 145 mg/dL — AB (ref 70–99)
Glucose-Capillary: 117 mg/dL — ABNORMAL HIGH (ref 70–99)
Glucose-Capillary: 180 mg/dL — ABNORMAL HIGH (ref 70–99)
Glucose-Capillary: 186 mg/dL — ABNORMAL HIGH (ref 70–99)

## 2014-04-08 LAB — BASIC METABOLIC PANEL
ANION GAP: 6 (ref 5–15)
Anion gap: 8 (ref 5–15)
BUN: 22 mg/dL (ref 6–23)
BUN: 21 mg/dL (ref 6–23)
CHLORIDE: 104 mmol/L (ref 96–112)
CHLORIDE: 100 mmol/L (ref 96–112)
CO2: 33 mmol/L — ABNORMAL HIGH (ref 19–32)
CO2: 39 mmol/L — ABNORMAL HIGH (ref 19–32)
Calcium: 8.6 mg/dL (ref 8.4–10.5)
Calcium: 8.9 mg/dL (ref 8.4–10.5)
Creatinine, Ser: 0.73 mg/dL (ref 0.50–1.35)
Creatinine, Ser: 0.79 mg/dL (ref 0.50–1.35)
GFR calc Af Amer: >90 mL/min (ref >90)
GFR calc Af Amer: >90 mL/min (ref >90)
GLUCOSE: 124 mg/dL — AB (ref 70–99)
Glucose, Bld: 138 mg/dL — ABNORMAL HIGH (ref 70–99)
POTASSIUM: 3.7 mmol/L (ref 3.5–5.1)
POTASSIUM: 3.9 mmol/L (ref 3.5–5.1)
SODIUM: 145 mmol/L (ref 135–145)
SODIUM: 145 mmol/L (ref 135–145)

## 2014-04-08 LAB — CBC
HCT: 45 % (ref 39.0–52.0)
Hemoglobin: 13.1 g/dL (ref 13.0–17.0)
MCH: 28.2 pg (ref 26.0–34.0)
MCHC: 29.1 g/dL — AB (ref 30.0–36.0)
MCV: 96.8 fL (ref 78.0–100.0)
PLATELETS: 260 10*3/uL (ref 150–400)
RBC: 4.65 MIL/uL (ref 4.22–5.81)
RDW: 13.8 % (ref 11.5–15.5)
WBC: 7.5 10*3/uL (ref 4.0–10.5)

## 2014-04-08 MED ORDER — FENTANYL CITRATE 0.05 MG/ML IJ SOLN
50.0000 ug | INTRAMUSCULAR | Status: DC | PRN
  Administered 2014-04-08 – 2014-04-12 (×15): 50 ug via INTRAVENOUS
  Filled 2014-04-08 (×17): qty 2

## 2014-04-08 MED ORDER — FUROSEMIDE 10 MG/ML IJ SOLN
40.0000 mg | Freq: Two times a day (BID) | INTRAMUSCULAR | Status: DC
  Administered 2014-04-08 – 2014-04-12 (×10): 40 mg via INTRAVENOUS
  Filled 2014-04-08 (×14): qty 4

## 2014-04-08 MED ORDER — LORAZEPAM 0.5 MG PO TABS
1.0000 mg | ORAL_TABLET | Freq: Two times a day (BID) | ORAL | Status: DC
  Administered 2014-04-08 (×2): 1 mg
  Filled 2014-04-08: qty 1
  Filled 2014-04-08: qty 2

## 2014-04-08 MED ORDER — VITAL AF 1.2 CAL PO LIQD
1000.0000 mL | ORAL | Status: DC
  Administered 2014-04-08 – 2014-04-20 (×5): 1000 mL
  Filled 2014-04-08 (×19): qty 1000

## 2014-04-08 MED ORDER — POTASSIUM CHLORIDE 20 MEQ/15ML (10%) PO SOLN
20.0000 meq | ORAL | Status: AC
  Administered 2014-04-08 (×2): 20 meq
  Filled 2014-04-08 (×2): qty 15

## 2014-04-08 NOTE — Progress Notes (Signed)
[facility] FOR [facility] LAB REPLACEMENT ONLY  The patient does apply for the [facility] Electrolyte Re[facility] Protocol based on the criteria listed below:   1. Is GFR >/= 40 ml/min? Yes.    Patient's GFR today is >90 2. Is urine output >/= 0.5 ml/kg/hr for the last 6 hours? Yes.   Patient's UOP is .7 ml/kg/hr 3. Is BUN < 60 mg/dL? Yes.    Patient's BUN today is 22 4. Abnormal electrolyte  K 3.7 5. Ordered repletion with: per protocol 6. If a panic level lab has been reported, has the CCM MD in charge been notified? Yes.  .   Physician:  Lawerance SabalYacoub  Andree Golphin McEachran 04/08/2014 3:40 AM

## 2014-04-08 NOTE — Progress Notes (Signed)
NUTRITION FOLLOW-UP  INTERVENTION: D/C Prostat 30 ml BID  Increase Vital AF to goal rate of 55 ml/hr (1320 ml per day)   Provides 1584 kcal (98% estimated needs), 99 grams of protein, 1070 ml free water daily.   NUTRITION DIAGNOSIS: Inadequate oral intake related to inability to eat as evidenced by NPO status; ongoing  Goal: Pt to meet >/= 90% of estimated energy requirements; ongoing  Monitor:  Tube feed tolerance/adeqaucy, weight, labs  ASSESSMENT:  24 y/[facility]. He was intubated prior to ICU admission. PMH of Tourette's, Tic disorder, Scoliosis, hemiatrophy of right leg, and Club foot.   Labs and medications reviewed. Pt is currently intubated on ventilator support. Minute Ventilation: 8.3 L/min Temperature: 37.8 Propofol: off  Height: Ht Readings from Last 1 Encounters:  03/31/14 4\' 10"  (1.473 m)    Weight: Wt Readings from Last 1 Encounters:  04/08/14 149 lb 14.6 oz (68 kg)    BMI:  Body mass index is 31.34 kg/(m^2).   Estimated Nutritional Needs: Kcal: 1609 Protein: 82-95 grams Fluid: >/= 1.5L daily  Skin: intact  Diet Order:    Intake/Output Summary (Last 24 hours) at 04/08/14 1247 Last data filed at 04/08/14 1230  Gross per 24 hour  Intake 2944.99 ml  Output   2720 ml  Net 224.99 ml    Last BM: 3/3  Labs:   Recent Labs Lab 04/02/14 0240 04/03/14 0313  04/06/14 0222 04/07/14 0320 04/07/14 1108 04/08/14 0231  NA 143 148*  < > 151* 145 144 145  K 3.8 4.9  < > 4.5 4.6 3.4* 3.7  CL 106 109  < > 112 105  --  104  CO2 28 29  < > 32 31  --  33*  BUN 17 34*  < > 24* 17  --  22  CREATININE 1.16 1.77*  < > 1.05 0.83  --  0.73  CALCIUM 8.9 8.7  < > 8.7 8.7  --  8.6  MG 2.3 2.6*  --   --   --   --   --   PHOS 2.8 6.2*  --   --   --   --   --   GLUCOSE 102* 113*  < > 152* 161*  --  124*  < > = values in this interval not displayed.  CBG (last 3)   Recent Labs  04/08/14 0426 04/08/14 0823 04/08/14 1202  GLUCAP  100* 126* 117*    Scheduled Meds: . antiseptic oral rinse  7 mL Mouth Rinse QID  . chlorhexidine  15 mL Mouth Rinse BID  . cloNIDine  0.3 mg Oral QHS  . feeding supplement (PRO-STAT SUGAR FREE 64)  30 mL Per Tube BID  . feeding supplement (VITAL AF 1.2 CAL)  1,000 mL Per Tube Q24H  . fentaNYL  75 mcg Transdermal Q72H  . furosemide  40 mg Intravenous Q12H  . heparin  5,000 Units Subcutaneous 3 times per day  . LORazepam  1 mg Per Tube BID  . pantoprazole sodium  40 mg Per Tube Daily  . polyethylene glycol  17 g Oral Daily  . risperiDONE  2 mg Per Tube BID     Aaron FeltElisa Maui Britten, MS Dietetic Intern Pager: [phone]

## 2014-04-08 NOTE — Progress Notes (Signed)
PULMONARY / CRITICAL CARE MEDICINE   Name: [name] MRN: [id number] DOB: 08-17-1990    ADMISSION DATE:  03/31/2014 CONSULTATION DATE:  04/08/2014  REFERRING MD :  EDP  CHIEF COMPLAINT:  SOB  INITIAL PRESENTATION:  24 y.o. M brought to [facility] ED 2/24 with SOB.  In ED, he was hypoxic with sats in 60's.  He was placed on BiPAP but repeat ABG 3 hours later showed progressive respiratory decline.  Given his hx of cervical fusion, micrognathia, prior trach, etc. (which all predispose him to difficult airway), he was intubated prior to ICU admission.  STUDIES:  CTA chest 2/24 >>> negative for PE, limited study due to motion.  SIGNIFICANT EVENTS: 2/24  Admit 2/27  Awake, follows commands on fent / precedex  2/28  Remains on PRVC, agitation / sedation issues.  Weaned briefly on 15/5 3/02  Trach planned per ENT   SUBJECTIVE: calm on vent   VITAL SIGNS: Temp:  [97.6 F (36.4 C)-100.9 F (38.3 C)] 98.9 F (37.2 C) (03/03 0900) Pulse Rate:  [80-131] 131 (03/03 0900) Resp:  [11-27] 16 (03/03 0900) BP: (103-158)/(51-87) 150/87 mmHg (03/03 0900) SpO2:  [95 %-100 %] 100 % (03/03 1001) FiO2 (%):  [40 %] 40 % (03/03 1001) Weight:  [68 kg (149 lb 14.6 oz)] 68 kg (149 lb 14.6 oz) (03/03 0435)   HEMODYNAMICS:     VENTILATOR SETTINGS: Vent Mode:  [-] PRVC FiO2 (%):  [40 %] 40 % Set Rate:  [14 bmp-20 bmp] 20 bmp Vt Set:  [320 mL-360 mL] 360 mL PEEP:  [5 cmH20] 5 cmH20 Plateau Pressure:  [23 cmH20-35 cmH20] 28 cmH20   INTAKE / OUTPUT: Intake/Output      03/02 0701 - 03/03 0700 03/03 0701 - 03/04 0700   I.V. (mL/kg) 1149.9 (16.9) 62.9 (0.9)   NG/GT 1884 120   IV Piggyback     Total Intake(mL/kg) 3033.9 (44.6) 182.9 (2.7)   Urine (mL/kg/hr) 1325 (0.8) 145 (0.6)   Stool 0 (0) 0 (0)   Total Output 1325 145   Net +1708.9 +37.9        Stool Occurrence 1 x 1 x     PHYSICAL EXAMINATION: General: Young male, in NAD on vent Neuro: sedate on vent rass -3 HEENT:  PERRL, trach site clean,  Micrognathia Cardiovascular: RRR, no M/R/G.  Lungs: less ronchi Abdomen: BS x 4, soft, NT/ND.  Musculoskeletal: R club foot, R hemiatrophy. Skin: Intact, warm, no rashes.  LABS:  CBC  Recent Labs Lab 04/06/14 0222 04/07/14 0320 04/07/14 1108 04/08/14 0231  WBC 10.6* 9.9  --  7.5  HGB 13.6 14.8 14.6 13.1  HCT 47.5 50.4 43.0 45.0  PLT 244 230  --  260   BMET  Recent Labs Lab 04/06/14 0222 04/07/14 0320 04/07/14 1108 04/08/14 0231  NA 151* 145 144 145  K 4.5 4.6 3.4* 3.7  CL 112 105  --  104  CO2 32 31  --  33*  BUN 24* 17  --  22  CREATININE 1.05 0.83  --  0.73  GLUCOSE 152* 161*  --  124*   Electrolytes  Recent Labs Lab 04/02/14 0240 04/03/14 0313  04/06/14 0222 04/07/14 0320 04/08/14 0231  CALCIUM 8.9 8.7  < > 8.7 8.7 8.6  MG 2.3 2.6*  --   --   --   --   PHOS 2.8 6.2*  --   --   --   --   < > = values in this interval  not displayed.   Sepsis Markers No results for input(s): LATICACIDVEN, PROCALCITON, O2SATVEN in the last 168 hours. ABG  Recent Labs Lab 04/04/14 0510 04/07/14 1108 04/07/14 1620  PHART 7.328* 7.489* 7.301*  PCO2ART 56.6* 43.1 69.9*  PO2ART 63.5* 251.0* 64.0*   Liver Enzymes  Recent Labs Lab 04/06/14 0222  AST 55*  ALT 30  ALKPHOS 48  BILITOT 0.6  ALBUMIN 3.2*    Glucose  Recent Labs Lab 04/07/14 1201 04/07/14 1554 04/07/14 1934 04/08/14 0033 04/08/14 0426 04/08/14 0823  GLUCAP 117* 87 87 145* 100* 126*    Imaging Dg Chest Port 1 View  04/08/2014   CLINICAL DATA:  Acute respiratory failure and axial or chest x-ray of April 07, 2014  EXAM: PORTABLE CHEST - 1 VIEW  COMPARISON:  Portable chest x-ray of April 07, 2014 1343 hr  FINDINGS: The lung volumes are low. The pulmonary interstitium is more dense bilaterally. The tracheostomy appliance tip lies at the level of the inferior margin of the clavicular heads approximately 3.7 cm above the crotch of the carina. The cardiac silhouette remains enlarged. The right heart  border and the hemidiaphragms are less distinct today. The esophagogastric tube tip projects below the inferior margin of the image.  IMPRESSION: Bilateral hypoinflation with progressive increase in interstitial edema. Lower lobe atelectasis and/or pleural effusion on the left is suspected.   Electronically Signed   By: David  [place]   On: 04/08/2014 07:23   Dg Chest Port 1 View  04/07/2014   CLINICAL DATA:  Tracheostomy and orogastric tube placement  EXAM: PORTABLE CHEST - 1 VIEW  COMPARISON:  Portable chest x-ray of April 07, 2014  FINDINGS: A tracheostomy has been placed following removal of the endotracheal tube. The tip of the tracheostomy appliance is approximately 3.3 cm above the carina. An esophagogastric tube is present whose tip and proximal port project below the expected level of the GE junction. The examination is otherwise stable.  IMPRESSION: Interval placement of a tracheostomy appliance and an esophagogastric tube with good positioning radiographically.   Electronically Signed   By: David  [place]   On: 04/07/2014 15:45   Dg Chest Port 1 View  04/07/2014   CLINICAL DATA:  Pneumonia.  EXAM: PORTABLE CHEST - 1 VIEW  COMPARISON:  04/06/2014.  FINDINGS: Endotracheal tube and NG tube in stable position. Stable cardiomegaly. No pulmonary venous congestion on today's exam. Left lower lobe atelectasis and or infiltrate. No pneumothorax. Prior spinal fusion. Stable deformity of the chest wall.  IMPRESSION: 1. Lines and tubes in stable position. 2. Stable cardiomegaly. No evidence of pulmonary venous congestion on today's exam. 3. Persistent left lower lobe atelectasis and/or infiltrate. Left lower lobe pneumonia should be considered.   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 TF per nutrition. Continue miralax May need peg, assess TRach collar attempts prior  HEMATOLOGIC A:   VTE Prophylaxis P:  SCD's / Heparin. Trend CBC   INFECTIOUS A:   Possible CAP - question of prior infiltrate on CXR.   P:   Rocephin, start date 2/25>>>2/29 Azithro, start date 2/25>>>2/29 Ceftaz 2/29>>>3/2 Monitor for temps  ENDOCRINE A:   No known issues P:   SSI if glucose consistently > 180.  NEUROLOGIC A:   Acute metabolic encephalopathy Hx Insomnia, Tourette's syndrome, tic disorder, scoliosis s/p surgical fixation, right hemiatrophy, right club foot P:   Continue propofol but wua and hold as plan Fentanyl drip for pain  RASS goal: -1. Hold outpatient dexmethylphenidate and adderall Risperdal, maxed, maintain To get off drips, consider low dose ativan   Family updated: no family at bedside 3/2, 3/3  Ccm time 30 min   Mcarthur RossettiDaniel J. Tyson AliasFeinstein, MD, FACP Pgr: [phone] [facility]

## 2014-04-08 NOTE — Progress Notes (Signed)
Report called to Barbara CowerJason, RN on 2 [facility]. Pt being transferred to room 5 per MD order. Contacted pts mother and she was informed of the transfer. Tried the pts father using the number that is listed on the chart and unable to reach pts father.  Pt transferred via bed with personal belongings.

## 2014-04-08 NOTE — Progress Notes (Signed)
UR Completed.  [phone]  

## 2014-04-08 NOTE — Progress Notes (Signed)
eLink Physician-Brief Progress Note Patient Name: Alphons [name] DOB: 04/01/1990 MRN: [id number]   Date of Service  04/08/2014  HPI/Events of Note  Patient is actively being diuresed and has put out 2+ liters today.   eICU Interventions  Will check BMP now. Keep potassium >= 4.0 if renal function is normal.     Intervention Category Minor Interventions: Clinical assessment - ordering diagnostic tests  [name],[name] 04/08/2014, 7:30 PM

## 2014-04-08 NOTE — Trach Care Team (Signed)
Trach Care Progression Note   Patient Details Name: [name] MRN: [id number] DOB: 07/08/1990 Today's Date: 04/08/2014   Tracheostomy Assessment    Tracheostomy Shiley 6 mm Cuffed (Active)  Status Secured 04/08/2014 12:27 PM  Site Assessment Bleeding;Oozing secretions 04/08/2014 12:27 PM  Site Care Cleansed;Dried 04/08/2014 12:27 PM  Inner Cannula Care Changed/new 04/08/2014  3:15 AM  Ties Assessment Secure 04/08/2014 12:27 PM  Emergency Equipment at bedside Yes 04/08/2014 12:27 PM     Care Needs     Respiratory Therapy O2 Device: Ventilator FiO2 (%): 40 % SpO2: 99 % Education:  (not needed) Follow up recommendations:  (will follow for pt progress) Respiratory barriers to progression:  (pt remains on full vent support)    Speech Language Pathology      Physical Therapy      Occupational Therapy      Nutritional Patient's Current Diet: Tube feeding Tube Feeding: Vital AF 1.2 Cal Tube Feeding Frequency: Continuous Tube Feeding Strength: Full strength    Case Management/Social Work      Theatre managerrovider Trach Care Team/Provider Recommendations Trach Care Team Members Present-  Cherylin MylarLauren Doyle, RT, Shon BatonJenna Holloman, SW, Joaquin CourtsKimberly Harris, RD  Anders SimmondsPete Babcock, NP New trach yesterday by ENT. Continue to follow.           Keliyah Spillman, Silva BandyDebra Anita (scribe for team) 04/08/2014, 1:49 PM

## 2014-04-08 NOTE — Progress Notes (Signed)
Transferred patient while connected to vent to 2C05 with no complications.

## 2014-04-08 NOTE — Progress Notes (Signed)
100ml fentanyl infusion wasted. Vanessa BarbaraJulian Bebraut RN witnessed.  Jacqulyn Canehristopher Scott Janesia Joswick RN, BSN, CCRN

## 2014-04-09 ENCOUNTER — Inpatient Hospital Stay (HOSPITAL_COMMUNITY): Payer: Medicaid Other

## 2014-04-09 DIAGNOSIS — Z930 Tracheostomy status: Secondary | ICD-10-CM

## 2014-04-09 DIAGNOSIS — R451 Restlessness and agitation: Secondary | ICD-10-CM

## 2014-04-09 LAB — BASIC METABOLIC PANEL
ANION GAP: 14 (ref 5–15)
BUN: 22 mg/dL (ref 6–23)
CALCIUM: 9.3 mg/dL (ref 8.4–10.5)
CHLORIDE: 103 mmol/L (ref 96–112)
CO2: 29 mmol/L (ref 19–32)
CREATININE: 0.86 mg/dL (ref 0.50–1.35)
GFR calc non Af Amer: >90 mL/min (ref >90)
Glucose, Bld: 136 mg/dL — ABNORMAL HIGH (ref 70–99)
POTASSIUM: 4.2 mmol/L (ref 3.5–5.1)
Sodium: 146 mmol/L — ABNORMAL HIGH (ref 135–145)

## 2014-04-09 LAB — CBC WITH DIFFERENTIAL/PLATELET
BASOS ABS: 0 10*3/uL (ref 0.0–0.1)
BASOS PCT: 0 % (ref 0–1)
Eosinophils Absolute: 0.1 10*3/uL (ref 0.0–0.7)
Eosinophils Relative: 1 % (ref 0–5)
HCT: 45.1 % (ref 39.0–52.0)
Hemoglobin: 13.7 g/dL (ref 13.0–17.0)
LYMPHS PCT: 8 % — AB (ref 12–46)
Lymphs Abs: 0.8 10*3/uL (ref 0.7–4.0)
MCH: 29 pg (ref 26.0–34.0)
MCHC: 30.4 g/dL (ref 30.0–36.0)
MCV: 95.6 fL (ref 78.0–100.0)
Monocytes Absolute: 1.2 10*3/uL — ABNORMAL HIGH (ref 0.1–1.0)
Monocytes Relative: 12 % (ref 3–12)
NEUTROS ABS: 7.7 10*3/uL (ref 1.7–7.7)
NEUTROS PCT: 78 % — AB (ref 43–77)
Platelets: 269 10*3/uL (ref 150–400)
RBC: 4.72 MIL/uL (ref 4.22–5.81)
RDW: 13.8 % (ref 11.5–15.5)
WBC: 9.8 10*3/uL (ref 4.0–10.5)

## 2014-04-09 LAB — GLUCOSE, CAPILLARY
GLUCOSE-CAPILLARY: 139 mg/dL — AB (ref 70–99)
GLUCOSE-CAPILLARY: 124 mg/dL — AB (ref 70–99)
Glucose-Capillary: 134 mg/dL — ABNORMAL HIGH (ref 70–99)
Glucose-Capillary: 142 mg/dL — ABNORMAL HIGH (ref 70–99)
Glucose-Capillary: 144 mg/dL — ABNORMAL HIGH (ref 70–99)
Glucose-Capillary: 158 mg/dL — ABNORMAL HIGH (ref 70–99)

## 2014-04-09 MED ORDER — HALOPERIDOL LACTATE 5 MG/ML IJ SOLN
1.0000 mg | INTRAMUSCULAR | Status: DC | PRN
  Administered 2014-04-09 – 2014-04-10 (×3): 2 mg via INTRAVENOUS
  Filled 2014-04-09 (×4): qty 1

## 2014-04-09 MED ORDER — CLONIDINE HCL 0.1 MG PO TABS
0.1000 mg | ORAL_TABLET | Freq: Three times a day (TID) | ORAL | Status: DC
  Administered 2014-04-17 – 2014-04-20 (×10): 0.1 mg
  Filled 2014-04-09 (×35): qty 1

## 2014-04-09 MED ORDER — FREE WATER: 200.0000 mL | Freq: Three times a day (TID) | Status: DC

## 2014-04-09 MED ORDER — CLONAZEPAM 1 MG PO TABS
1.0000 mg | ORAL_TABLET | Freq: Two times a day (BID) | ORAL | Status: DC
  Administered 2014-04-17 – 2014-04-20 (×7): 1 mg
  Filled 2014-04-09 (×7): qty 1

## 2014-04-09 MED ORDER — ENOXAPARIN SODIUM 40 MG/0.4ML ~~LOC~~ SOLN
40.0000 mg | SUBCUTANEOUS | Status: DC
  Administered 2014-04-09 – 2014-04-27 (×16): 40 mg via SUBCUTANEOUS
  Filled 2014-04-09 (×20): qty 0.4

## 2014-04-09 MED ORDER — QUETIAPINE FUMARATE 100 MG PO TABS
100.0000 mg | ORAL_TABLET | Freq: Two times a day (BID) | ORAL | Status: DC
Start: 2014-04-09 — End: 2014-04-12
  Filled 2014-04-09 (×8): qty 1

## 2014-04-09 NOTE — Progress Notes (Signed)
Found pt with NG tube removed. Pt denies removing NG tube. The last time I check on him at 0130, the NG tube was in and infusing. MD was notified and two nurses failed to replace NG tube. The rapid response nurse was called to assist with placements.    Barbara CowerJason Carel Schnee

## 2014-04-09 NOTE — Progress Notes (Signed)
Patient educated on need for NG tube and nutritional support.  He refused to have NG tube placed at this time.  Will discuss with MD when making rounds.  Patient is stable at this time.  Will continue to monitor.

## 2014-04-09 NOTE — Progress Notes (Signed)
Placed back on full support due to decreased VE.  Placed pt on new vent settings ordered by MD

## 2014-04-09 NOTE — Progress Notes (Signed)
04/09/2014 10:45 AM  [name], [name] [id number]  Post-Op Day 2    Temp:  [98.9 F (37.2 C)-101.5 F (38.6 C)] 99.8 F (37.7 C) (03/04 0729) Pulse Rate:  [92-134] 98 (03/04 0834) Resp:  [10-29] 16 (03/04 0729) BP: (98-140)/(52-93) 125/78 mmHg (03/04 0834) SpO2:  [93 %-100 %] 95 % (03/04 0834) FiO2 (%):  [40 %] 40 % (03/04 0834) Weight:  [68.2 kg (150 lb 5.7 oz)] 68.2 kg (150 lb 5.7 oz) (03/04 0500),     Intake/Output Summary (Last 24 hours) at 04/09/14 1045 Last data filed at 04/09/14 0650  Gross per 24 hour  Intake 953.94 ml  Output   3280 ml  Net -2326.06 ml    Results for orders placed or performed during the hospital encounter of 03/31/14 (from the past 24 hour(s))  Glucose, capillary     Status: Abnormal   Collection Time: 04/08/14 12:02 PM  Result Value Ref Range   Glucose-Capillary 117 (H) 70 - 99 mg/dL  Glucose, capillary     Status: Abnormal   Collection Time: 04/08/14  3:58 PM  Result Value Ref Range   Glucose-Capillary 180 (H) 70 - 99 mg/dL  Glucose, capillary     Status: Abnormal   Collection Time: 04/08/14  7:40 PM  Result Value Ref Range   Glucose-Capillary 186 (H) 70 - 99 mg/dL  Basic metabolic panel     Status: Abnormal   Collection Time: 04/08/14  9:30 PM  Result Value Ref Range   Sodium 145 135 - 145 mmol/L   Potassium 3.9 3.5 - 5.1 mmol/L   Chloride 100 96 - 112 mmol/L   CO2 39 (H) 19 - 32 mmol/L   Glucose, Bld 138 (H) 70 - 99 mg/dL   BUN 21 6 - 23 mg/dL   Creatinine, Ser 7.820.79 0.50 - 1.35 mg/dL   Calcium 8.9 8.4 - 95.610.5 mg/dL   GFR calc non Af Amer >90 >90 mL/min   GFR calc Af Amer >90 >90 mL/min   Anion gap 6 5 - 15  Glucose, capillary     Status: Abnormal   Collection Time: 04/09/14 12:34 AM  Result Value Ref Range   Glucose-Capillary 158 (H) 70 - 99 mg/dL  Basic metabolic panel     Status: Abnormal   Collection Time: 04/09/14  4:16 AM  Result Value Ref Range   Sodium 146 (H) 135 - 145 mmol/L   Potassium 4.2 3.5 - 5.1 mmol/L   Chloride  103 96 - 112 mmol/L   CO2 29 19 - 32 mmol/L   Glucose, Bld 136 (H) 70 - 99 mg/dL   BUN 22 6 - 23 mg/dL   Creatinine, Ser 2.130.86 0.50 - 1.35 mg/dL   Calcium 9.3 8.4 - 08.610.5 mg/dL   GFR calc non Af Amer >90 >90 mL/min   GFR calc Af Amer >90 >90 mL/min   Anion gap 14 5 - 15  CBC with Differential/Platelet     Status: Abnormal   Collection Time: 04/09/14  4:16 AM  Result Value Ref Range   WBC 9.8 4.0 - 10.5 K/uL   RBC 4.72 4.22 - 5.81 MIL/uL   Hemoglobin 13.7 13.0 - 17.0 g/dL   HCT 57.845.1 46.939.0 - 62.952.0 %   MCV 95.6 78.0 - 100.0 fL   MCH 29.0 26.0 - 34.0 pg   MCHC 30.4 30.0 - 36.0 g/dL   RDW 52.813.8 41.311.5 - 24.415.5 %   Platelets 269 150 - 400 K/uL   Neutrophils Relative % 78 (H)  43 - 77 %   Neutro Abs 7.7 1.7 - 7.7 K/uL   Lymphocytes Relative 8 (L) 12 - 46 %   Lymphs Abs 0.8 0.7 - 4.0 K/uL   Monocytes Relative 12 3 - 12 %   Monocytes Absolute 1.2 (H) 0.1 - 1.0 K/uL   Eosinophils Relative 1 0 - 5 %   Eosinophils Absolute 0.1 0.0 - 0.7 K/uL   Basophils Relative 0 0 - 1 %   Basophils Absolute 0.0 0.0 - 0.1 K/uL  Glucose, capillary     Status: Abnormal   Collection Time: 04/09/14  4:34 AM  Result Value Ref Range   Glucose-Capillary 142 (H) 70 - 99 mg/dL  Glucose, capillary     Status: Abnormal   Collection Time: 04/09/14  7:50 AM  Result Value Ref Range   Glucose-Capillary 144 (H) 70 - 99 mg/dL    SUBJECTIVE:  More awake/alert  OBJECTIVE:  Trach clean and secure  IMPRESSION:  Satisfactory check  PLAN:  Routine trach care.  Could go to cuffless trach tube when no longer needed for vent or pressure support.  Will discuss with Critical Care about whether to leave the trach permanently.   Flo Shanks

## 2014-04-09 NOTE — Progress Notes (Signed)
PULMONARY / CRITICAL CARE MEDICINE   Name: [name] MRN: [id number] DOB: 04-02-1990    ADMISSION DATE:  03/31/2014 CONSULTATION DATE:  04/09/2014  REFERRING MD :  EDP  CHIEF COMPLAINT:  SOB  INITIAL PRESENTATION:  24 y.o. M brought to [facility] ED 2/24 with SOB.  In ED, he was hypoxic with sats in 60's.  He was placed on BiPAP but repeat ABG 3 hours later showed progressive respiratory decline.  Given his hx of cervical fusion, micrognathia, prior trach, etc. (which all predispose him to difficult airway), he was intubated prior to ICU admission.  STUDIES:  CTA chest 2/24 >>> negative for PE, limited study due to motion.  SIGNIFICANT EVENTS: 2/24 Admit 2/27 Awake, follows commands on fent / precedex  2/28 Remains on PRVC, agitation / sedation issues.  Weaned briefly on 15/5 3/02 Trach placement [facility]) 3/04 Lots of agitation. Bedside sitter ordered. PT ordered. Tolerated PS 20 cm H2O  SUBJECTIVE:  Appears to understand but uncooperative requiring bedside sitter   VITAL SIGNS: Temp:  [98.9 F (37.2 C)-101.5 F (38.6 C)] 99.6 F (37.6 C) (03/04 1156) Pulse Rate:  [94-134] 116 (03/04 1156) Resp:  [10-27] 16 (03/04 0729) BP: (98-134)/(52-93) 131/72 mmHg (03/04 1156) SpO2:  [93 %-100 %] 93 % (03/04 1156) FiO2 (%):  [40 %] 40 % (03/04 1156) Weight:  [68.2 kg (150 lb 5.7 oz)] 68.2 kg (150 lb 5.7 oz) (03/04 0500)   HEMODYNAMICS:     VENTILATOR SETTINGS: Vent Mode:  [-] PSV;CPAP FiO2 (%):  [40 %] 40 % Set Rate:  [20 bmp] 20 bmp Vt Set:  [360 mL] 360 mL PEEP:  [5 cmH20] 5 cmH20 Pressure Support:  [20 cmH20] 20 cmH20 Plateau Pressure:  [23 cmH20-25 cmH20] 25 cmH20   INTAKE / OUTPUT: Intake/Output      03/03 0701 - 03/04 0700 03/04 0701 - 03/05 0700   I.V. (mL/kg) 115.1 (1.7)    NG/GT 1051.7    Total Intake(mL/kg) 1166.8 (17.1)    Urine (mL/kg/hr) 3425 (2.1) 250 (0.5)   Stool 0 (0)    Total Output 3425 250   Net -2258.2 -250        Stool Occurrence 2 x      PHYSICAL  EXAMINATION: General: RASS0 to +2, uncooperative Neuro: MAEs HEENT:  NAD Cardiovascular: Reg, no M.  Lungs: clear anteriorly Abdomen: Soft, NT Ext: R club foot, R hemiatrophy., no edema   LABS: I have reviewed all of today's lab results. Relevant abnormalities are discussed in the A/P section  ASSESSMENT / PLAN:  PULMONARY OETT 2/24 >> 3/03 [facility]) 3/03 >>  A: Acute hypercarbic and hypoxic respiratory failure Restrictive physiology due to severe scoliosis Trach status P:   Wean as tolerated in PSV > ATC Will likely need LTACH  CARDIOVASCULAR A:  Hypertension P:  Clonidine dosing clarified   RENAL A:   AKI Hypernatremia P:   Monitor BMET intermittently Monitor I/Os Correct electrolytes as indicated Free water ordered 3/04  GASTROINTESTINAL A:   Constipation Dysphagia due to trach status Difficult NGT placement P:   SUP: Pantoprazole. IR to replace NGT Then resume TFs  HEMATOLOGIC A:   No issues P:  DVT px: SQ heparin Monitor CBC intermittently Transfuse per usual ICU guidelines   INFECTIOUS A:   Suspected CAP, treated P:   Rocephin, start date 2/25>>>2/29 Azithro, start date 2/25>>>2/29 Ceftaz 2/29>>>3/2  Monitor temp, WBC count Micro and abx as above   ENDOCRINE A:   No known issues P:  SSI if glucose consistently > 180.  NEUROLOGIC A:   Acute metabolic encephalopathy Agitation and uncooperativeness  Hx Insomnia, Tourette's syndrome, tic disorder, scoliosis s/p surgical fixation, right hemiatrophy, right club foot  P:   RASS goal: 0 Cont clonazepam Cont fent patch Change risperidone to quetiapine 3/04 Sitter @ bedside 3/04   Mcarthur Rossettianiel J. Tyson AliasFeinstein, MD, FACP Pgr: [phone] [place] [facility]

## 2014-04-10 DIAGNOSIS — J690 Pneumonitis due to inhalation of food and vomit: Secondary | ICD-10-CM

## 2014-04-10 LAB — GLUCOSE, CAPILLARY
GLUCOSE-CAPILLARY: 144 mg/dL — AB (ref 70–99)
GLUCOSE-CAPILLARY: 135 mg/dL — AB (ref 70–99)
Glucose-Capillary: 129 mg/dL — ABNORMAL HIGH (ref 70–99)
Glucose-Capillary: 144 mg/dL — ABNORMAL HIGH (ref 70–99)
Glucose-Capillary: 147 mg/dL — ABNORMAL HIGH (ref 70–99)
Glucose-Capillary: 154 mg/dL — ABNORMAL HIGH (ref 70–99)

## 2014-04-10 MED ORDER — LORAZEPAM 2 MG/ML IJ SOLN
1.0000 mg | Freq: Once | INTRAMUSCULAR | Status: DC
  Filled 2014-04-10: qty 1

## 2014-04-10 NOTE — Progress Notes (Signed)
Patient sp02 was 88% on FI02 40%. Suctioned patient with 3ml saline bullet but sat remained 88%.  Removed patient from ventilator, lavaged with 10ml saline bullet and bagged patient for 2 min., returned patient to ventilator and increased FI02 to 100%.  Patient tolerated well: HR 98, RR 18, sp02 93%.

## 2014-04-10 NOTE — Progress Notes (Signed)
PT Cancellation Note  Patient Details Name: [name] MRN: [id number] DOB: 07/19/1990   Cancelled Treatment:    Reason Eval/Treat Not Completed: Medical issues which prohibited therapy Patient's resting HR 127, SpO2 87% on vent, attempting to sit up in bed despite restraints in place and cues to lie back. Will hold therapy at this time. Follow up tomorrow for PT evaluation.  Berton MountBarbour, [name] S 04/10/2014, 2:38 PM Aaron SpillersLogan Secor [name] TerraceBarbour, [place] CarolinaPT [phone]

## 2014-04-10 NOTE — Progress Notes (Signed)
PULMONARY / CRITICAL CARE MEDICINE   Name: [name] MRN: [id number] DOB: [birth year]    ADMISSION DATE:  03/31/2014 CONSULTATION DATE:  04/10/2014  REFERRING MD :  EDP  CHIEF COMPLAINT:  SOB  INITIAL PRESENTATION:  24 y.o. M brought to [facility] ED 2/24 with SOB.  In ED, he was hypoxic with sats in 60's.  He was placed on BiPAP but repeat ABG 3 hours later showed progressive respiratory decline.  Given his hx of cervical fusion, micrognathia, prior trach, etc. (which all predispose him to difficult airway), he was intubated prior to ICU admission.  STUDIES:  CTA chest 2/24 >>> negative for PE, limited study due to motion.  SIGNIFICANT EVENTS: 2/24 Admit 2/27 Awake, follows commands on fent / precedex  2/28 Remains on PRVC, agitation / sedation issues.  Weaned briefly on 15/5 3/02 Trach placement [facility]) 3/04 Lots of agitation. Bedside sitter ordered. PT ordered. Tolerated PS 20 cm H2O  SUBJECTIVE:  Nurse notes apparent extrapyramidal effects after haldol required for agitation over night. He pulled out feeding tube originally placed by anesthesia with tracheostomy. Nurses report apparent blockage related to known very difficult airway anatomy when they attempted to replace.   VITAL SIGNS: Temp:  [97.9 F (36.6 C)-99.6 F (37.6 C)] 97.9 F (36.6 C) (03/05 0743) Pulse Rate:  [80-116] 90 (03/05 0743) Resp:  [15-23] 17 (03/05 0743) BP: (111-132)/(66-80) 132/72 mmHg (03/05 0743) SpO2:  [92 %-97 %] 92 % (03/05 0743) FiO2 (%):  [40 %] 40 % (03/05 0743) Weight:  [65.8 kg (145 lb 1 oz)] 65.8 kg (145 lb 1 oz) (03/05 0430)   HEMODYNAMICS:     VENTILATOR SETTINGS: Vent Mode:  [-] PRVC FiO2 (%):  [40 %] 40 % Set Rate:  [14 bmp] 14 bmp Vt Set:  [360 mL] 360 mL PEEP:  [5 cmH20] 5 cmH20 Pressure Support:  [20 cmH20] 20 cmH20 Plateau Pressure:  [20 cmH20-23 cmH20] 23 cmH20   INTAKE / OUTPUT: Intake/Output      03/04 0701 - 03/05 0700 03/05 0701 - 03/06 0700   I.V. (mL/kg)     NG/GT      Total Intake(mL/kg)     Urine (mL/kg/hr) 700 (0.4) 850 (3.8)   Stool     Total Output 700 850   Net -700 -850          PHYSICAL EXAMINATION: General: fixed gaze to upper left, not responsive to me, sitter in room Neuro: apparent extrapyramidal deviation of gaze, tonic extension of fingers HEENT:  NAD, tracheostomy site intact Cardiovascular: Reg, no M.  Lungs: clear anteriorly Abdomen: Soft, NT Ext: R club foot, R hemiatrophy., no edema   LABS: I have reviewed all of today's lab results. Relevant abnormalities are discussed in the A/P section  ASSESSMENT / PLAN:  PULMONARY OETT 2/24 >> 3/03 [facility]) 3/03 >>  A: Acute hypercarbic and hypoxic respiratory failure Restrictive physiology due to severe scoliosis Trach status P:   Wean as tolerated in PSV > ATC. Will likely need LTACH  CARDIOVASCULAR A:  Hypertension P:  Clonidine    RENAL A:   AKI Hypernatremia P:   Monitor BMET intermittently Monitor I/Os Correct electrolytes as indicated Free water ordered 3/04  GASTROINTESTINAL A:   Constipation Dysphagia due to trach status Difficult NGT placement P:   SUP: Pantoprazole. IR to replace NGT request placed. Staff unable to place tube and meds being held Then resume TFs  HEMATOLOGIC A:   No issues P:  DVT px: SQ heparin Monitor  CBC intermittently Transfuse per usual ICU guidelines   INFECTIOUS A:   Suspected CAP, treated P:   Rocephin, start date 2/25>>>2/29 Azithro, start date 2/25>>>2/29 Ceftaz 2/29>>>3/2  Monitor temp, WBC count Micro and abx as above   ENDOCRINE A:   No known issues P:   SSI if glucose consistently > 180.  NEUROLOGIC A:   Acute metabolic encephalopathy Agitation and uncooperativeness  Hx Insomnia, Tourette's syndrome, tic disorder, scoliosis s/p surgical fixation, right hemiatrophy, right club foot  Apparent extrapyramidal reaction to Haldol. Discussed with pharmacy.  P:   RASS goal: 0 Cont  clonazepam Cont fent patch Change risperidone to quetiapine 3/04 Sitter @ bedside 3/04 Haldol dc'd Will try ativan if needed for agitation once Haldol wears off.   CD Maple HudsonYoung, MD Pgr: [phone]    m [phone] [place] [facility]   After 3:00 PM   p [phone]

## 2014-04-10 NOTE — Progress Notes (Signed)
Patient has become increasingly restless and agitated, more difficult to redirect.  Cooperates with staff for brief moments.  His gaze appears to remain to the upper left and he has difficulty making eye contact. Pupil assessment performed and appear WDL.  His right hand displays finger extension, but he is able to follow commands and has normal movement and grip.  There is a great need for a safety sitter at this time.  If one cannot be obtained, Posey belt may be appropriate.  Will call and notify MD.

## 2014-04-11 LAB — GLUCOSE, CAPILLARY
GLUCOSE-CAPILLARY: 137 mg/dL — AB (ref 70–99)
GLUCOSE-CAPILLARY: 151 mg/dL — AB (ref 70–99)
Glucose-Capillary: 162 mg/dL — ABNORMAL HIGH (ref 70–99)
Glucose-Capillary: 146 mg/dL — ABNORMAL HIGH (ref 70–99)
Glucose-Capillary: 135 mg/dL — ABNORMAL HIGH (ref 70–99)

## 2014-04-11 MED ORDER — ACETAMINOPHEN 650 MG RE SUPP
650.0000 mg | RECTAL | Status: DC | PRN
  Administered 2014-04-11 – 2014-04-13 (×6): 650 mg via RECTAL
  Filled 2014-04-11 (×5): qty 1

## 2014-04-11 NOTE — Progress Notes (Signed)
MD notified regarding pt's meds. Pt is currently NPO and has no access for med administration at this time. Pt has scheduled PO clonazepam, orders received for IV Ativan. However, pt previously received Haldol and had reaction to it. Will continue to monitor pt closely.

## 2014-04-11 NOTE — Evaluation (Signed)
Physical Therapy Evaluation Patient Details Name: [name] MRN: [id number] DOB: 01/23/1991 Today's Date: 04/11/2014   History of Present Illness  Patient is a 24 yo male admitted 03/31/14 with respiratory failure, hypoxia (in 60's in ED) and was intubated.   Had trach on 04/07/14.  Recent agitation/confusion.  Patient with waist restraint and mitts.  PMH:  Scoliosis, HTN, Tourette's syndrome, Rt hemiatrophy RLE, Rt club foot, cervical fusion.    Clinical Impression  Patient presents with problems listed below.  Will benefit from acute PT to maximize independence prior to discharge. Patient able to answer yes/no questions by shaking head.  Seemed to be fairly appropriate.  Per chart and patient, he was independent pta with ambulation and daily activities.  Recommend LTACH at d/c for continued therapy (if remains on vent).    Follow Up Recommendations LTACH;Supervision/Assistance - 24 hour    Equipment Recommendations  Wheelchair (measurements PT);Rolling walker with 5" wheels    Recommendations for Other Services       Precautions / Restrictions Precautions Precautions: Fall Precaution Comments: Trach, on vent Restrictions Weight Bearing Restrictions: No      Mobility  Bed Mobility Overal bed mobility: Needs Assistance Bed Mobility: Supine to Sit;Sit to Supine     Supine to sit: Min guard Sit to supine: Min assist   General bed mobility comments: Verbal cues for technique.  Patient able to move to EOB with min guard assist for safety/balance.  Patient able to use UE's to move trunk to sitting position.  Once upright, patient able to maintain balance statically and during dynamic activities.  Patient sat EOB x 12 minutes.  Returned to supine with min assist to bring LE's onto bed and to reposition in supine.  Transfers                    Ambulation/Gait                Stairs            Wheelchair Mobility    Modified Rankin (Stroke Patients Only)        Balance Overall balance assessment: Needs assistance Sitting-balance support: No upper extremity supported;Feet unsupported Sitting balance-Leahy Scale: Good                                       Pertinent Vitals/Pain Pain Assessment: Faces Faces Pain Scale: Hurts a little bit Pain Location: Around trach Pain Intervention(s): Repositioned    Home Living Family/patient expects to be discharged to:: Unsure Living Arrangements: Parent Available Help at Discharge: Family Type of Home: House       Home Layout: One level Home Equipment: Other (comment) (Unsure. Patient reports no AD with ambulation)      Prior Function Level of Independence: Independent         Comments: No assistive device for ambulation per patient.     Hand Dominance   Dominant Hand: Right    Extremity/Trunk Assessment   Upper Extremity Assessment: Overall WFL for tasks assessed           Lower Extremity Assessment: Generalized weakness;RLE deficits/detail RLE Deficits / Details: RLE shorter than LLE.  No movement Rt foot/ankle (active or passive)  Strength grossly 3+/5 at knee/hip.    Cervical / Trunk Assessment: Other exceptions  Communication   Communication: Tracheostomy (Answers yes/no questions shakes head-seems to be appropriate)  Cognition Arousal/Alertness: Awake/alert Behavior  During Therapy: Restless;Impulsive Overall Cognitive Status: Difficult to assess                      General Comments      Exercises        Assessment/Plan    PT Assessment Patient needs continued PT services  PT Diagnosis Difficulty walking;Generalized weakness   PT Problem List Decreased strength;Decreased range of motion;Decreased activity tolerance;Decreased balance;Decreased mobility;Decreased knowledge of use of DME;Decreased safety awareness;Cardiopulmonary status limiting activity  PT Treatment Interventions DME instruction;Gait training;Functional mobility  training;Therapeutic activities;Therapeutic exercise;Balance training;Patient/family education   PT Goals (Current goals can be found in the Care Plan section) Acute Rehab PT Goals Patient Stated Goal: None PT Goal Formulation: With patient Time For Goal Achievement: 04/25/14 Potential to Achieve Goals: Good    Frequency Min 3X/week   Barriers to discharge        Co-evaluation               End of Session Equipment Utilized During Treatment: Other (comment) (Patient on vent) Activity Tolerance: Patient tolerated treatment well;Patient limited by fatigue Patient left: in bed;with call bell/phone within reach;with restraints reapplied;with SCD's reapplied           Time: 1914-7829 PT Time Calculation (min) (ACUTE ONLY): 24 min   Charges:   PT Evaluation $Initial PT Evaluation Tier I: 1 Procedure PT Treatments $Therapeutic Activity: 8-22 mins   PT G Codes:        Vena Austria 04-22-14, 2:36 PM Durenda Hurt. Renaldo Fiddler, [facility] Acute Rehab Services Pager [phone]

## 2014-04-11 NOTE — Progress Notes (Signed)
PULMONARY / CRITICAL CARE MEDICINE   Name: [name] MRN: [id number] DOB: 05/13/1990    ADMISSION DATE:  03/31/2014 CONSULTATION DATE:  04/11/2014  REFERRING MD :  EDP  CHIEF COMPLAINT:  SOB  INITIAL PRESENTATION:  24 y.o. M [facility] with [facility].  In ED, he was hypoxic with sats in 60's.  He was placed on BiPAP but repeat ABG 3 hours later showed progressive respiratory decline.  Given his hx of cervical fusion, micrognathia, prior trach, etc. (which all predispose him to difficult airway), he was intubated prior to ICU admission.  STUDIES:  CTA chest 2/24 >>> negative for PE, limited study due to motion.  SIGNIFICANT EVENTS: 2/24 Admit 2/27 Awake, follows commands on fent / precedex  2/28 Remains on PRVC, agitation / sedation issues.  Weaned briefly on 15/5 3/02 Trach placement [facility]) 3/04 Lots of agitation. Bedside sitter ordered. PT ordered. Tolerated PS 20 cm H2O  SUBJECTIVE:  EPS symptoms from Haldol have resolved. PT with patient. Still needs replacement of feeding tube.  Father here  VITAL SIGNS: Temp:  [90.9 F (32.7 C)-104.8 F (40.4 C)] 90.9 F (32.7 C) (03/06 1259) Pulse Rate:  [89-127] 92 (03/06 1259) Resp:  [13-29] 13 (03/06 1259) BP: (117-140)/(63-82) 125/63 mmHg (03/06 1259) SpO2:  [88 %-100 %] 99 % (03/06 1259) FiO2 (%):  [40 %-100 %] 40 % (03/06 1259) Weight:  [61.2 kg (134 lb 14.7 oz)] 61.2 kg (134 lb 14.7 oz) (03/06 0451)   HEMODYNAMICS:     VENTILATOR SETTINGS: Vent Mode:  [-] CPAP;PSV FiO2 (%):  [40 %-100 %] 40 % Set Rate:  [14 bmp] 14 bmp Vt Set:  [360 mL] 360 mL PEEP:  [5 cmH20] 5 cmH20 Pressure Support:  [20 cmH20] 20 cmH20 Plateau Pressure:  [21 cmH20-23 cmH20] 21 cmH20   INTAKE / OUTPUT: Intake/Output      03/05 0701 - 03/06 0700 03/06 0701 - 03/07 0700   Urine (mL/kg/hr) 2200 (1.5)    Total Output 2200     Net -2200            PHYSICAL EXAMINATION: General: Up bedside, cooperative w PT doing mouth care Neuro:   HEENT:  NAD, tracheostomy site intact Cardiovascular: Reg, no M.  Lungs: clear anteriorly Abdomen: Soft, NT Ext: R club foot, R hemiatrophy., no edema   LABS: I have reviewed all of today's lab results. Relevant abnormalities are discussed in the A/P section  ASSESSMENT / PLAN:  PULMONARY OETT 2/24 >> 3/03 [facility] 3/03 >>  A: Acute hypercarbic and hypoxic respiratory failure Restrictive physiology due to severe scoliosis Trach status P:   Wean as tolerated in PSV > ATC. Will likely need LTACH  CARDIOVASCULAR A:  Hypertension P:  Clonidine    RENAL A:   AKI Hypernatremia P:   Monitor BMET intermittently Monitor I/Os Correct electrolytes as indicated Free water ordered 3/04  GASTROINTESTINAL A:   Constipation Dysphagia due to trach status Difficult NGT placement P:   SUP: Pantoprazole. IR to replace NGT request placed. Staff unable to place tube and meds being held Then resume TFs  HEMATOLOGIC A:   No issues P:  DVT px: SQ heparin Monitor CBC intermittently Transfuse per usual ICU guidelines   INFECTIOUS A:   Suspected CAP, treated P:   Rocephin, start date 2/25>>>2/29 Azithro, start date 2/25>>>2/29 Ceftaz 2/29>>>3/2  Monitor temp, WBC count Micro and abx as above   ENDOCRINE A:   No known issues P:   SSI if  glucose consistently > 180.  NEUROLOGIC A:   Acute metabolic encephalopathy Agitation and uncooperativeness  Hx Insomnia, Tourette's syndrome, tic disorder, scoliosis s/p surgical fixation, right hemiatrophy, right club foot  Apparent extrapyramidal reaction to Haldol. Discussed with pharmacy. Resolved off Haldol. P:   RASS goal: 0 Cont clonazepam Cont fent patch Change risperidone to quetiapine 3/04 Sitter @ bedside 3/04 Haldol dc'd Will try ativan if needed for agitation once Haldol wears off.   CD Maple Hudson, MD Pgr: [phone]    m [phone] [facility]   After 3:00 PM   p [phone]

## 2014-04-12 ENCOUNTER — Inpatient Hospital Stay (HOSPITAL_COMMUNITY): Payer: Medicaid Other

## 2014-04-12 LAB — GLUCOSE, CAPILLARY
GLUCOSE-CAPILLARY: 135 mg/dL — AB (ref 70–99)
Glucose-Capillary: 140 mg/dL — ABNORMAL HIGH (ref 70–99)
Glucose-Capillary: 122 mg/dL — ABNORMAL HIGH (ref 70–99)
Glucose-Capillary: 132 mg/dL — ABNORMAL HIGH (ref 70–99)
Glucose-Capillary: 136 mg/dL — ABNORMAL HIGH (ref 70–99)
Glucose-Capillary: 152 mg/dL — ABNORMAL HIGH (ref 70–99)

## 2014-04-12 MED ORDER — LORAZEPAM 2 MG/ML IJ SOLN
1.0000 mg | Freq: Three times a day (TID) | INTRAMUSCULAR | Status: DC
  Administered 2014-04-12 – 2014-04-13 (×4): 1 mg via INTRAVENOUS
  Filled 2014-04-12 (×3): qty 1

## 2014-04-12 MED ORDER — LORAZEPAM 2 MG/ML IJ SOLN
INTRAMUSCULAR | Status: AC
  Filled 2014-04-12: qty 1

## 2014-04-12 MED ORDER — FENTANYL CITRATE 0.05 MG/ML IJ SOLN
50.0000 ug | INTRAMUSCULAR | Status: DC | PRN
Start: 2014-04-12 — End: 2014-04-20
  Administered 2014-04-13 – 2014-04-19 (×19): 75 ug via INTRAVENOUS
  Filled 2014-04-12 (×19): qty 2

## 2014-04-12 MED ORDER — IOHEXOL 300 MG/ML  SOLN
50.0000 mL | Freq: Once | INTRAMUSCULAR | Status: AC | PRN
  Administered 2014-04-12: 50 mL via INTRAVENOUS

## 2014-04-12 MED ORDER — LORAZEPAM 2 MG/ML IJ SOLN
2.0000 mg | Freq: Once | INTRAMUSCULAR | Status: AC
  Administered 2014-04-12: 2 mg via INTRAVENOUS
  Filled 2014-04-12: qty 1

## 2014-04-12 MED ORDER — MIDAZOLAM HCL 2 MG/2ML IJ SOLN
1.0000 mg | INTRAMUSCULAR | Status: DC | PRN
  Administered 2014-04-13 – 2014-04-17 (×12): 1 mg via INTRAVENOUS
  Filled 2014-04-12 (×12): qty 2

## 2014-04-12 MED ORDER — LORAZEPAM 2 MG/ML IJ SOLN
0.5000 mg | INTRAMUSCULAR | Status: DC | PRN
  Administered 2014-04-12 (×2): 1 mg via INTRAVENOUS
  Filled 2014-04-12: qty 1

## 2014-04-12 MED ORDER — MIDAZOLAM HCL 2 MG/2ML IJ SOLN
1.0000 mg | INTRAMUSCULAR | Status: DC | PRN
  Administered 2014-04-12 (×2): 1 mg via INTRAVENOUS
  Filled 2014-04-12 (×2): qty 2

## 2014-04-12 MED ORDER — LORAZEPAM 2 MG/ML IJ SOLN
1.0000 mg | Freq: Once | INTRAMUSCULAR | Status: AC
  Administered 2014-04-12: 1 mg via INTRAVENOUS
  Filled 2014-04-12: qty 1

## 2014-04-12 MED ORDER — DEXTROSE 5 % IV SOLN
INTRAVENOUS | Status: DC
  Administered 2014-04-12: 19:00:00 via INTRAVENOUS
  Administered 2014-04-12: 1000 mL via INTRAVENOUS
  Administered 2014-04-13 – 2014-04-15 (×4): via INTRAVENOUS
  Administered 2014-04-16: 50 mL via INTRAVENOUS
  Administered 2014-04-16 – 2014-04-19 (×3): via INTRAVENOUS

## 2014-04-12 NOTE — Progress Notes (Signed)
PT Cancellation Note  Patient Details Name: [name] MRN: [id number] DOB: 12/19/1990   Cancelled Treatment:    Reason Eval/Treat Not Completed: Patient at procedure or test/unavailable.  Patient with IR for placement of feeding tube.  Will return tomorrow for PT session   [name], Krystiana Fornes H 04/12/2014, [name], PT, [name]

## 2014-04-12 NOTE — Progress Notes (Signed)
PCCM Doc  paged to make aware that feeding tube was pulled out by patient.

## 2014-04-12 NOTE — Clinical Social Work Note (Signed)
CSW spoke with patients father at bedside concerning patient disposition at time of discharge.  Pt father states that pt normally lives with pt mother and that she would need to be the main decision maker in this process- pt father states that he is separated from pt mother.   Pt father is agreeable to looking into SNF if pt mother is unable to take pt home.  CSW explained Vent SNF process and informed pt father that pt would likely be placed in [place] due to Medicaid as a primary payer- pt father expressed understanding.  CSW attempted to call pt mother to discuss- left message.  Merlyn LotJenna Holoman, LCSWA Clinical Social Worker [phone]

## 2014-04-12 NOTE — Progress Notes (Signed)
Feeding tube pulled out by patient.. In restraints and with sitter at bedside. Xray department notified to check for placement of tube since it is not all the way out.

## 2014-04-12 NOTE — Progress Notes (Signed)
Restless and pulling at tubes this shift. Sitter present at bedside. Dr. Yetta BarreJones and Dr Tyson AliasFeinstein made aware and orders put in for agitation.

## 2014-04-12 NOTE — Progress Notes (Signed)
eLink Physician-Brief Progress Note Patient Name: [name] DOBJoseph Art: 07/04/1990 MRN: [id number]   Date of Service  04/12/2014  HPI/Events of Note  Patient very agitated and restless pulling at vent.  Able to get out of restraints.  Review of med administration shows regular use of fentanyl q2 hours 50 mcg along with several prn doses of ativan.  Has scheduled versed 1 mg q4 hours and fentanyl patch.  eICU Interventions  Plan: Increase dosing of fentanyl to 50 - 75 mcg q2 hours prn Change frequency of versed to q2 hours prn     Intervention Category Major Interventions: Delirium, psychosis, severe agitation - evaluation and management  Harshini Trent 04/12/2014, 11:27 PM

## 2014-04-12 NOTE — Progress Notes (Signed)
Pt bedpan and sheet were wet with urine twice, even with the present of a Foley. I check the fluid in the balloon at 2100 on 04/11/14 and it was 7 cc. I add 3 cc of fluid. At 0000 the foley leaked again and there had be no urine output in the foley bag but the bedpan and sheet were soaked with urine. The MD in [facility] was notified and orders to change foley was given. Foley was removed and pt voided 250 urine. A foley was reinserted because the pt is on IV lasix BID.    Caralee AtesJason Evonda Enge, RN

## 2014-04-12 NOTE — Progress Notes (Signed)
eLink Physician-Brief Progress Note Patient Name: [name] DOB: 09/21/1990 MRN: [id number]   Date of Service  04/12/2014  HPI/Events of Note    eICU Interventions  Extra dose of ativan given to hopefully facilitate GT placement     Intervention Category Minor Interventions: Other:  Laker Thompson S. 04/12/2014, 3:50 PM

## 2014-04-12 NOTE — Progress Notes (Signed)
Resting quietly; not restless and pulling at tubes since receiving last dose of Ativan 2mg  IV. Sitter at bedside.

## 2014-04-12 NOTE — Progress Notes (Addendum)
Pt became increasely agitated, pulling at vent tube, and trying to get out of bed. The pt was able to remove mittens, and turn self with abdominal restraint in place. [facility] was called and cameraed into the room. Orders received and  1 mg Ativan was given.   Caralee AtesJason Teea Ducey, RN

## 2014-04-12 NOTE — Progress Notes (Signed)
PULMONARY / CRITICAL CARE MEDICINE   Name: [name] MRN: [id number] DOB: 1990-11-29    ADMISSION DATE:  03/31/2014 CONSULTATION DATE:  04/12/2014  REFERRING MD :  EDP  CHIEF COMPLAINT:  SOB  INITIAL PRESENTATION:  24 y.o. M brought to [facility] ED 2/24 with SOB.  In ED, he was hypoxic with sats in 60's.  He was placed on BiPAP but repeat ABG 3 hours later showed progressive respiratory decline.  Given his hx of cervical fusion, micrognathia, prior trach, etc. (which all predispose him to difficult airway), he was intubated prior to ICU admission.  STUDIES:  CTA chest 2/24 >>> negative for PE, limited study due to motion.  SIGNIFICANT EVENTS: 2/24 Admit 2/27 Awake, follows commands on fent / precedex  2/28 Remains on PRVC, agitation / sedation issues.  Weaned briefly on 15/5 3/02 Trach placement [facility]) 3/04 Lots of agitation. Bedside sitter ordered. PT ordered. Tolerated PS 20 cm H2O  SUBJECTIVE:  agitation  VITAL SIGNS: Temp:  [90.9 F (32.7 C)-100.9 F (38.3 C)] 97.4 F (36.3 C) (03/07 0819) Pulse Rate:  [83-133] 133 (03/07 0819) Resp:  [13-27] 24 (03/07 0819) BP: (113-160)/(63-89) 143/77 mmHg (03/07 0819) SpO2:  [99 %-100 %] 100 % (03/07 0819) FiO2 (%):  [40 %] 40 % (03/07 0854) Weight:  [62 kg (136 lb 11 oz)] 62 kg (136 lb 11 oz) (03/07 0500)   HEMODYNAMICS:     VENTILATOR SETTINGS: Vent Mode:  [-] PRVC FiO2 (%):  [40 %] 40 % Set Rate:  [14 bmp] 14 bmp Vt Set:  [360 mL] 360 mL PEEP:  [5 cmH20] 5 cmH20 Pressure Support:  [20 cmH20] 20 cmH20 Plateau Pressure:  [20 cmH20-23 cmH20] 23 cmH20   INTAKE / OUTPUT: Intake/Output      03/06 0701 - 03/07 0700 03/07 0701 - 03/08 0700   I.V. (mL/kg) 200 (3.2)    Total Intake(mL/kg) 200 (3.2)    Urine (mL/kg/hr) 735 (0.5)    Total Output 735     Net -535          Urine Occurrence 2 x      PHYSICAL EXAMINATION: General: agitated Neuro: agitation noted, moves all ext equal HEENT:  NAD, tracheostomy site  intact Cardiovascular: s1 s2 Reg, no M.  Lungs: anterior reduced Abdomen: Soft, NT Ext: R club foot, R hemiatrophy., no edema   LABS: I have reviewed all of today's lab results. Relevant abnormalities are discussed in the A/P section  ASSESSMENT / PLAN:  PULMONARY OETT 2/24 >> 3/03 [facility]) 3/03 >>  A: Acute hypercarbic and hypoxic respiratory failure Restrictive physiology due to severe scoliosis Trach status P:   Wean this am failed as apnea and agitation Called Rt will repeat weaning attempts PS 15-20 start Neg balance as able Consider vent placement, LTACH  CARDIOVASCULAR A:  Hypertension P:  Clonidine  tele  RENAL A:   AKI Hypernatremia  P:   Monitor BMET intermittently Monitor I/Os Correct electrolytes as indicated Free water increase, may need d5w  GASTROINTESTINAL A:   Constipation Dysphagia due to trach status Difficult NGT placement P:   SUP: Pantoprazole. IR needs peg TFs  HEMATOLOGIC A:   No issues P:  DVT px: SQ heparin Monitor CBC intermittently Transfuse per usual ICU guidelines   INFECTIOUS A:   Suspected CAP, treated P:   Rocephin, start date 2/25>>>2/29 Azithro, start date 2/25>>>2/29 Ceftaz 2/29>>>3/2 Follow fever curves  ENDOCRINE A:   No known issues P:   Goal 140-180  NEUROLOGIC A:  Acute metabolic encephalopathy Agitation and uncooperativeness  Hx Insomnia, Tourette's syndrome, tic disorder, scoliosis s/p surgical fixation, right hemiatrophy, right club foot  Apparent extrapyramidal reaction to Haldol. Discussed with pharmacy. Resolved off Haldol. P:   RASS goal: 0 Cont clonazepam Cont fent patch Change risperidone to quetiapine 3/04 - dc with SE noted Sitter @ bedside 3/04  Move toward ativan prn NO dop antagonists  Mcarthur Rossettianiel J. Tyson AliasFeinstein, MD, FACP Pgr: [phone] [facility]

## 2014-04-12 NOTE — Progress Notes (Signed)
In room to check patient. Resting quietly. Sitter present at bedside.

## 2014-04-13 ENCOUNTER — Inpatient Hospital Stay (HOSPITAL_COMMUNITY): Payer: Medicaid Other

## 2014-04-13 DIAGNOSIS — R1310 Dysphagia, unspecified: Secondary | ICD-10-CM | POA: Diagnosis present

## 2014-04-13 LAB — BASIC METABOLIC PANEL
Anion gap: 15 (ref 5–15)
Anion gap: 18 — ABNORMAL HIGH (ref 5–15)
BUN: 109 mg/dL — ABNORMAL HIGH (ref 6–23)
BUN: 111 mg/dL — ABNORMAL HIGH (ref 6–23)
CALCIUM: 10.5 mg/dL (ref 8.4–10.5)
CO2: 36 mmol/L — ABNORMAL HIGH (ref 19–32)
CO2: 32 mmol/L (ref 19–32)
CREATININE: 1.98 mg/dL — AB (ref 0.50–1.35)
CREATININE: 1.99 mg/dL — AB (ref 0.50–1.35)
Calcium: 9.5 mg/dL (ref 8.4–10.5)
Chloride: 113 mmol/L — ABNORMAL HIGH (ref 96–112)
Chloride: 112 mmol/L (ref 96–112)
GFR calc Af Amer: 53 mL/min — ABNORMAL LOW (ref >90)
GFR calc Af Amer: 53 mL/min — ABNORMAL LOW (ref >90)
GFR calc non Af Amer: 46 mL/min — ABNORMAL LOW (ref >90)
GFR calc non Af Amer: 46 mL/min — ABNORMAL LOW (ref >90)
GLUCOSE: 146 mg/dL — AB (ref 70–99)
Glucose, Bld: 189 mg/dL — ABNORMAL HIGH (ref 70–99)
Potassium: 3.7 mmol/L (ref 3.5–5.1)
Potassium: 3.4 mmol/L — ABNORMAL LOW (ref 3.5–5.1)
Sodium: 164 mmol/L (ref 135–145)
Sodium: 162 mmol/L (ref 135–145)

## 2014-04-13 LAB — GLUCOSE, CAPILLARY
GLUCOSE-CAPILLARY: 205 mg/dL — AB (ref 70–99)
Glucose-Capillary: 127 mg/dL — ABNORMAL HIGH (ref 70–99)
Glucose-Capillary: 187 mg/dL — ABNORMAL HIGH (ref 70–99)

## 2014-04-13 LAB — SODIUM, URINE, RANDOM: Sodium, Ur: 31 mmol/L

## 2014-04-13 MED ORDER — FREE WATER
250.0000 mL | Freq: Three times a day (TID) | Status: DC
  Administered 2014-04-17 – 2014-04-22 (×14): 250 mL

## 2014-04-13 MED ORDER — DEXTROSE 5 % IV SOLN
2.0000 g | INTRAVENOUS | Status: AC
  Administered 2014-04-13 – 2014-04-20 (×7): 2 g via INTRAVENOUS
  Filled 2014-04-13 (×8): qty 2

## 2014-04-13 MED ORDER — VANCOMYCIN HCL IN DEXTROSE 1-5 GM/200ML-% IV SOLN
1000.0000 mg | Freq: Once | INTRAVENOUS | Status: AC
Start: 2014-04-13 — End: 2014-04-13
  Administered 2014-04-13: 1000 mg via INTRAVENOUS
  Filled 2014-04-13: qty 200

## 2014-04-13 MED ORDER — VALPROATE SODIUM 500 MG/5ML IV SOLN
375.0000 mg | Freq: Two times a day (BID) | INTRAVENOUS | Status: DC
  Administered 2014-04-14 – 2014-04-19 (×10): 375 mg via INTRAVENOUS
  Filled 2014-04-13 (×12): qty 3.75

## 2014-04-13 MED ORDER — LORAZEPAM 2 MG/ML IJ SOLN
2.0000 mg | Freq: Three times a day (TID) | INTRAMUSCULAR | Status: DC
  Administered 2014-04-13 – 2014-04-15 (×5): 2 mg via INTRAVENOUS
  Filled 2014-04-13 (×5): qty 1

## 2014-04-13 MED ORDER — DEXTROSE 5 % IV SOLN
1000.0000 mg | Freq: Once | INTRAVENOUS | Status: AC
  Administered 2014-04-13: 1000 mg via INTRAVENOUS
  Filled 2014-04-13: qty 10

## 2014-04-13 MED ORDER — FREE WATER: 200.0000 mL | Freq: Three times a day (TID) | Status: DC

## 2014-04-13 MED ORDER — VANCOMYCIN HCL IN DEXTROSE 750-5 MG/150ML-% IV SOLN
750.0000 mg | INTRAVENOUS | Status: DC
  Administered 2014-04-14: 750 mg via INTRAVENOUS
  Filled 2014-04-13 (×2): qty 150

## 2014-04-13 NOTE — Progress Notes (Signed)
MEDICATION RELATED CONSULT NOTE - INITIAL   Pharmacy Consult for Depakote Indication: acute metabolic encephalopathy with history of Tourette's, tic disorder  No Known Allergies  Patient Measurements: Height:  (147.3 cm) Weight: 133 lb 9.6 oz (60.6 kg) IBW/kg (Calculated) : 45.4   Vital Signs: Temp: 100.9 F (38.3 C) (03/08 1318) Temp Source: Axillary (03/08 1318) BP: 138/97 mmHg (03/08 1318) Pulse Rate: 137 (03/08 1318) Intake/Output from previous day: 03/07 0701 - 03/08 0700 In: 555 [I.V.:555] Out: 1025 [Urine:1025] Intake/Output from this shift:    Labs:  Recent Labs  04/13/14 0723  CREATININE 1.98*   Estimated Creatinine Clearance: 42.3 mL/min (by C-G formula based on Cr of 1.98).   Microbiology: Recent Results (from the past 720 hour(s))  Blood culture (routine x 2)     Status: None   Collection Time: 03/31/14  7:50 PM  Result Value Ref Range Status   Specimen Description BLOOD RIGHT HAND  Final   Special Requests BOTTLES DRAWN AEROBIC ONLY 10CC  Final   Culture   Final    NO GROWTH 5 DAYS Performed at [facility]    Report Status 04/07/2014 FINAL  Final  Blood culture (routine x 2)     Status: None   Collection Time: 03/31/14  8:00 PM  Result Value Ref Range Status   Specimen Description BLOOD LEFT ARM  Final   Special Requests BOTTLES DRAWN AEROBIC ONLY 10CC  Final   Culture   Final    NO GROWTH 5 DAYS Performed at [facility]    Report Status 04/07/2014 FINAL  Final  MRSA PCR Screening     Status: None   Collection Time: 03/31/14 11:47 PM  Result Value Ref Range Status   MRSA by PCR NEGATIVE NEGATIVE Final    Comment:        The GeneXpert MRSA Assay (FDA approved for NASAL specimens only), is one component of a comprehensive MRSA colonization surveillance program. It is not intended to diagnose MRSA infection nor to guide or monitor treatment for MRSA infections.   Culture, respiratory (NON-Expectorated)      Status: None   Collection Time: 04/01/14  2:33 AM  Result Value Ref Range Status   Specimen Description TRACHEAL ASPIRATE  Final   Special Requests NONE  Final   Gram Stain   Final    FEW WBC PRESENT, PREDOMINANTLY PMN FEW SQUAMOUS EPITHELIAL CELLS PRESENT FEW GRAM POSITIVE COCCI IN PAIRS FEW GRAM POSITIVE RODS Performed at [facility]    Culture   Final    Non-Pathogenic Oropharyngeal-type Flora Isolated. Performed at [facility]    Report Status 04/03/2014 FINAL  Final  Culture, respiratory (NON-Expectorated)     Status: None   Collection Time: 04/05/14 12:55 PM  Result Value Ref Range Status   Specimen Description SPUTUM  Final   Special Requests ET TUBE  Final   Gram Stain   Final    ABUNDANT WBC PRESENT,BOTH PMN AND MONONUCLEAR NO SQUAMOUS EPITHELIAL CELLS SEEN NO ORGANISMS SEEN Performed at [facility]    Culture   Final    NO GROWTH 2 DAYS Performed at [facility]    Report Status 04/07/2014 FINAL  Final    Medical History: Past Medical History  Diagnosis Date  . Tourette's syndrome   . Tic disorder   . Scoliosis   . Hemiatrophy of right leg   . Club foot     Right     Assessment: 23 YOM  who has had continued encephalopathy and agitation, not resolving on Klonopin PO, Versed and fent gtts. To start Depakote for delirium. Will dose for a load and maintenance based on mania indication.  His LFTs from 3/1 were mostly normal, AST was slightly elevated.  Goal of Therapy:  Trough concentration 50-12625mcg/mL for mania dosing   Plan:  -load with valproic acid 1000mg  IV x1 (~16mg /kg), then start a maintenance dose of 375mg  IV q12h * this dose can be titrated rapidly based on clinical response. Should not exceed 3600mg /day (60mg /kg/day) -follow up need for trough level, ability to transition to oral products  Danicia Terhaar D. Rasmus Preusser, PharmD, BCPS Clinical Pharmacist Pager: [phone] 04/13/2014 3:47 PM

## 2014-04-13 NOTE — Progress Notes (Signed)
Sitter reported that patient pulled NG tube out more.  When evaluated, tube was out at least 12" more than on last assessment.  CCM NP, Brett CanalesSteve Minor, paged and notified.  Patient's mother and safety sitter present at bedside.  Patient is stable.   Will continue to monitor.

## 2014-04-13 NOTE — Significant Event (Cosign Needed)
Called by RN with Na 164 Lasix held Repeat Na Free H20 Further eval during MD rounds.  Brett CanalesSteve Lorel Lembo ACNP [facility] PCCM Pager [phone] till 3 pm If no answer page [phone] 04/13/2014, 9:53 AM

## 2014-04-13 NOTE — Progress Notes (Signed)
Pt was combative throughout the night. He required freg doses of versed and fently. [facility] The pt has also been running a fever throughout the night and has required regular doses of Tylenol.   Caralee AtesJason Pasqualina Colasurdo, RN

## 2014-04-13 NOTE — Progress Notes (Signed)
CRITICAL VALUE ALERT  Critical value received:  Na 164  Date of notification:  04/13/14  Time of notification:  0845  Critical value read back:Yes.    Nurse who received alert:  Nicole CellaStephanie Alexismarie Flaim, RN  MD notified (1st page):  Dr Tyson AliasFeinstein  Time of first page:  [phone]  MD notified (2nd page):  Time of second page:  Responding MD:  Devra DoppSteve Minor, NP  Time MD responded:  [phone]

## 2014-04-13 NOTE — Clinical Social Work Note (Signed)
RNCM spoke with pt mother who the pt lives with- pt mother would like to take patient home at time of discharge.  CSW signing off for now- please re-consult if other CSW needs arise.  Merlyn LotJenna Holoman, LCSWA Clinical Social Worker [phone]

## 2014-04-13 NOTE — Progress Notes (Signed)
CRITICAL VALUE ALERT  Critical value received:  Na 162  Date of notification:  04/13/14  Time of notification:  1821  Critical value read back:Yes.    Nurse who received alert:  Nicole CellaStephanie Livingston Denner, RN  MD notified (1st page):  CCM  Time of first page:  1822  MD notified (2nd page):  Time of second page:  Responding MD:  Dr Sung AmabileSimonds  Time MD responded:  [phone]

## 2014-04-13 NOTE — Progress Notes (Signed)
ANTIBIOTIC CONSULT NOTE - INITIAL  Pharmacy Consult for vancomycin and cefepime Indication: HCAP  No Known Allergies  Patient Measurements: Height:  (147.3 cm) Weight: 133 lb 9.6 oz (60.6 kg) IBW/kg (Calculated) : 45.4   Vital Signs: Temp: 100.9 F (38.3 C) (03/08 1318) Temp Source: Axillary (03/08 1318) BP: 138/97 mmHg (03/08 1318) Pulse Rate: 137 (03/08 1318) Intake/Output from previous day: 03/07 0701 - 03/08 0700 In: 555 [I.V.:555] Out: 1025 [Urine:1025] Intake/Output from this shift:    Labs:  Recent Labs  04/13/14 0723  CREATININE 1.98*   Estimated Creatinine Clearance: 42.3 mL/min (by C-G formula based on Cr of 1.98). No results for input(s): VANCOTROUGH, VANCOPEAK, VANCORANDOM, GENTTROUGH, GENTPEAK, GENTRANDOM, TOBRATROUGH, TOBRAPEAK, TOBRARND, AMIKACINPEAK, AMIKACINTROU, AMIKACIN in the last 72 hours.   Microbiology: Recent Results (from the past 720 hour(s))  Blood culture (routine x 2)     Status: None   Collection Time: 03/31/14  7:50 PM  Result Value Ref Range Status   Specimen Description BLOOD RIGHT HAND  Final   Special Requests BOTTLES DRAWN AEROBIC ONLY 10CC  Final   Culture   Final    NO GROWTH 5 DAYS Performed at [facility]    Report Status 04/07/2014 FINAL  Final  Blood culture (routine x 2)     Status: None   Collection Time: 03/31/14  8:00 PM  Result Value Ref Range Status   Specimen Description BLOOD LEFT ARM  Final   Special Requests BOTTLES DRAWN AEROBIC ONLY 10CC  Final   Culture   Final    NO GROWTH 5 DAYS Performed at [facility]    Report Status 04/07/2014 FINAL  Final  MRSA PCR Screening     Status: None   Collection Time: 03/31/14 11:47 PM  Result Value Ref Range Status   MRSA by PCR NEGATIVE NEGATIVE Final    Comment:        The GeneXpert MRSA Assay (FDA approved for NASAL specimens only), is one component of a comprehensive MRSA colonization surveillance program. It is not intended to  diagnose MRSA infection nor to guide or monitor treatment for MRSA infections.   Culture, respiratory (NON-Expectorated)     Status: None   Collection Time: 04/01/14  2:33 AM  Result Value Ref Range Status   Specimen Description TRACHEAL ASPIRATE  Final   Special Requests NONE  Final   Gram Stain   Final    FEW WBC PRESENT, PREDOMINANTLY PMN FEW SQUAMOUS EPITHELIAL CELLS PRESENT FEW GRAM POSITIVE COCCI IN PAIRS FEW GRAM POSITIVE RODS Performed at [facility]    Culture   Final    Non-Pathogenic Oropharyngeal-type Flora Isolated. Performed at [facility]    Report Status 04/03/2014 FINAL  Final  Culture, respiratory (NON-Expectorated)     Status: None   Collection Time: 04/05/14 12:55 PM  Result Value Ref Range Status   Specimen Description SPUTUM  Final   Special Requests ET TUBE  Final   Gram Stain   Final    ABUNDANT WBC PRESENT,BOTH PMN AND MONONUCLEAR NO SQUAMOUS EPITHELIAL CELLS SEEN NO ORGANISMS SEEN Performed at [facility]    Culture   Final    NO GROWTH 2 DAYS Performed at [facility]    Report Status 04/07/2014 FINAL  Final    Medical History: Past Medical History  Diagnosis Date  . Tourette's syndrome   . Tic disorder   . Scoliosis   . Hemiatrophy of right leg   . Club  foot     Right   Assessment: 23 YOM admitted on 2/24 with SOB who required intubation and was eventually trached on 3/2. Has been unable to wean from ventilator and has had persistent infiltrates on imaging. He completed a course for CAP, but now with fever (Tmax/24h 102.7), to restart antibiotics.  SCr 1.98 up more than double from last value on 3/4 of 0.86, current est CrCl ~40-7245mL/min.  Goal of Therapy:  Vancomycin trough level 15-20 mcg/ml  Plan:  -vancomycin load with 1000mg  IV x1, then start maintenance dosing with 750mg  IV q24h as anticipate some worsening of renal function -cefepime 2g IV q24h  -f/u c/s, renal function, LOT  Camaya [employer] D.  Joseeduardo Brix, PharmD, BCPS Clinical Pharmacist Pager: [phone] 04/13/2014 3:30 PM

## 2014-04-13 NOTE — Progress Notes (Signed)
PT Cancellation Note  Patient Details Name: Cadan [name] MRN: [id number] DOB: 05/25/1990   Cancelled Treatment:    Reason Eval/Treat Not Completed: Other (comment) (per nursing, hold therapies today.) Patient restlessness and agitation.   Fabio AsaWerner, Alysiana Ethridge J 04/13/2014, 1:04 PM Charlotte Crumbevon Marcee Jacobs, PT DPT  [phone]

## 2014-04-13 NOTE — Progress Notes (Signed)
PULMONARY / CRITICAL CARE MEDICINE   Name: [name] MRN: [id number] DOB: 01/23/1991    ADMISSION DATE:  03/31/2014 CONSULTATION DATE:  04/13/2014  REFERRING MD :  EDP  CHIEF COMPLAINT:  SOB  INITIAL PRESENTATION:  24 y.o. M [facility] with [facility].  In ED, he was hypoxic with sats in 60's.  He was placed on BiPAP but repeat ABG 3 hours later showed progressive respiratory decline.  Given his hx of cervical fusion, micrognathia, prior trach, etc. (which all predispose him to difficult airway), he was intubated prior to ICU admission.  STUDIES:  CTA chest 2/24 >>> negative for PE, limited study due to motion.  SIGNIFICANT EVENTS: 2/24 Admit 2/27 Awake, follows commands on fent / precedex  2/28 Remains on PRVC, agitation / sedation issues.  Weaned briefly on 15/5 3/02 Trach placement [facility] 3/04 Lots of agitation. Bedside sitter ordered. PT ordered. Tolerated PS 20 cm H2O 3/8 Severe agitation despite frequent sedation PRN push.   SUBJECTIVE:  Had NGT placed in IR 3/7, but subsequently pulled it out. Was agitated most of night. [facility]   VITAL SIGNS: Temp:  [99.1 F (37.3 C)-102.7 F (39.3 C)] 100.9 F (38.3 C) (03/08 1318) Pulse Rate:  [112-147] 137 (03/08 1318) Resp:  [14-30] 23 (03/08 1318) BP: (114-139)/(63-97) 138/97 mmHg (03/08 1318) SpO2:  [97 %-100 %] 97 % (03/08 1318) FiO2 (%):  [40 %] 40 % (03/08 1306) Weight:  [60.6 kg (133 lb 9.6 oz)] 60.6 kg (133 lb 9.6 oz) (03/08 0500)   HEMODYNAMICS:     VENTILATOR SETTINGS: Vent Mode:  [-] PRVC FiO2 (%):  [40 %] 40 % Set Rate:  [14 bmp] 14 bmp Vt Set:  [360 mL] 360 mL PEEP:  [5 cmH20] 5 cmH20 Plateau Pressure:  [19 cmH20-22 cmH20] 21 cmH20   INTAKE / OUTPUT: Intake/Output      03/07 0701 - 03/08 0700 03/08 0701 - 03/09 0700   I.V. (mL/kg) 555 (9.2)    Total Intake(mL/kg) 555 (9.2)    Urine (mL/kg/hr) 1025 (0.7)    Total Output 1025     Net -470            PHYSICAL  EXAMINATION: General: agitated Neuro: agitation noted, moves all ext equal HEENT:  NAD, tracheostomy site intact Cardiovascular: s1 s2 Reg, no M.  Lungs: anterior reduced, barrel chest Abdomen: Soft, NT Ext: R club foot, R hemiatrophy, no edema   LABS: I have reviewed all of today's lab results. Relevant abnormalities are discussed in the A/P section  ASSESSMENT / PLAN:  PULMONARY OETT 2/24 >> 3/03 Janina [facility]([place] 3/03 >>  A: Acute hypercarbic and hypoxic respiratory failure Restrictive physiology due to severe scoliosis Trach status ARDS P:   Unable to wean this AM d/t agitation, re attempt after calmer Start weaning attempts at PS 22 after rass improved Hold diuresis Pos balance goal  CARDIOVASCULAR A:  Hypertension tachy P:  Clonidine  Telemetry monitoring Give fluid  RENAL A:   AKI Hypernatremia (164 on 3/8) Over diuresis, although was only neg 400 cc P:   Monitor BMET intermittently, at least bmet to avoid rapid rise Monitor I/Os Correct electrolytes as indicated Free water increase to 250mL q 8 hours Increase D5W to 7175mL/hr Check serum/urine Osm, Urine Na  GASTROINTESTINAL A:   Constipation Dysphagia due to trach status Difficult NGT placement P:   SUP: Pantoprazole. Pulled Panda tube, Needs PEG, order placed TFs when enteral route obtained  HEMATOLOGIC  A:   DVT prevention P:  DVT px: SQ heparin Monitor CBC intermittently Transfuse per usual ICU guidelines   INFECTIOUS A:   Suspected CAP, treated Poor weaning, fevers, persistent infiltrates P:   Rocephin, start date 2/25>>>2/29 Azithro, start date 2/25>>>2/29 Ceftaz 2/29>>>3/2 Follow fever curves  Consider bronch, organism? Add cefepime, vanc Send sputum  ENDOCRINE A:   No known issues P:   Goal glucose 140-180  NEUROLOGIC A:   Acute metabolic encephalopathy Agitation and uncooperativeness  Hx Insomnia, Tourette's syndrome, tic disorder, scoliosis s/p surgical  fixation, right hemiatrophy, right club foot  Apparent extrapyramidal reaction to Haldol. Discussed with pharmacy. Resolved off Haldol. Delirium, refrcatory P:   RASS goal: 0 Cont clonazepam Cont fent patch Versed and fentanyl adjusted overnight Avoiding dop antagonists Sitter @ bedside  Depakote load Correct Na  Global: Care management reports that he is not a candidate for LTACH/SSH. Mother is open to idea of taking him home with vent although unclear how that would be possible at this time.  [name], AGACNP-BC [facility] Pager [phone] or [phone]  STAFF NOTE: Cindi Carbon, MD FACP have personally reviewed patient's available data, including medical history, events of note, physical examination and test results as part of my evaluation. I have discussed with resident/NP and other care providers such as pharmacist, RN and RRT. In addition, I personally evaluated patient and elicited key findings of: delirium, add depakote, increase ativan, correct na, consider bronch in am if fevers continued with new abx, UA, sputum  Mcarthur Rossetti. Tyson Alias, MD, FACP Pgr: [phone] [facility] 04/13/2014 3:09 PM

## 2014-04-14 ENCOUNTER — Encounter (HOSPITAL_COMMUNITY): Payer: Self-pay | Admitting: Radiology

## 2014-04-14 DIAGNOSIS — Z4659 Encounter for fitting and adjustment of other gastrointestinal appliance and device: Secondary | ICD-10-CM | POA: Diagnosis present

## 2014-04-14 DIAGNOSIS — Z87898 Personal history of other specified conditions: Secondary | ICD-10-CM

## 2014-04-14 LAB — CBC
HCT: 55.7 % — ABNORMAL HIGH (ref 39.0–52.0)
HEMOGLOBIN: 16.2 g/dL (ref 13.0–17.0)
MCH: 29.3 pg (ref 26.0–34.0)
MCHC: 29.1 g/dL — ABNORMAL LOW (ref 30.0–36.0)
MCV: 100.9 fL — ABNORMAL HIGH (ref 78.0–100.0)
PLATELETS: 300 10*3/uL (ref 150–400)
RBC: 5.52 MIL/uL (ref 4.22–5.81)
RDW: 14.6 % (ref 11.5–15.5)
WBC: 11.2 10*3/uL — AB (ref 4.0–10.5)

## 2014-04-14 LAB — BASIC METABOLIC PANEL
ANION GAP: 10 (ref 5–15)
BUN: 94 mg/dL — AB (ref 6–23)
CALCIUM: 8.8 mg/dL (ref 8.4–10.5)
CO2: 34 mmol/L — AB (ref 19–32)
CREATININE: 1.66 mg/dL — AB (ref 0.50–1.35)
Chloride: 110 mmol/L (ref 96–112)
GFR, EST AFRICAN AMERICAN: 66 mL/min — AB (ref >90)
GFR, EST NON AFRICAN AMERICAN: 57 mL/min — AB (ref >90)
Glucose, Bld: 177 mg/dL — ABNORMAL HIGH (ref 70–99)
POTASSIUM: 4.2 mmol/L (ref 3.5–5.1)
SODIUM: 154 mmol/L — AB (ref 135–145)

## 2014-04-14 LAB — GLUCOSE, CAPILLARY
Glucose-Capillary: 189 mg/dL — ABNORMAL HIGH (ref 70–99)
Glucose-Capillary: 147 mg/dL — ABNORMAL HIGH (ref 70–99)

## 2014-04-14 LAB — OSMOLALITY, URINE: Osmolality, Ur: 794 mOsm/kg (ref 390–1090)

## 2014-04-14 LAB — OSMOLALITY: Osmolality: 383 mOsm/kg — ABNORMAL HIGH (ref 275–300)

## 2014-04-14 MED ORDER — INSULIN ASPART 100 UNIT/ML ~~LOC~~ SOLN
0.0000 [IU] | SUBCUTANEOUS | Status: DC
  Administered 2014-04-14 – 2014-04-15 (×2): 1 [IU] via SUBCUTANEOUS
  Administered 2014-04-20: 2 [IU] via SUBCUTANEOUS
  Administered 2014-04-21 – 2014-04-23 (×6): 1 [IU] via SUBCUTANEOUS
  Administered 2014-04-23 (×2): 2 [IU] via SUBCUTANEOUS

## 2014-04-14 NOTE — H&P (Signed)
Reason for Consult: Dysphagia, need for long term nutrition.  Chief Complaint: Chief Complaint  Patient presents with  . Shortness of Breath   Referring Physician(s): CCM  History of Present Illness: [name] is a 24 y.o. male who is mentally handicapped with respiratory failure s/p tracheostomy and on vent. He is currently being treated for HCAP and has previously had a NGT which he keeps pulling out. IR received request for percutaneous gastrostomy tube placement for dysphagia and need for long term feeding. History is obtained per the patient's mother and chart review and his mother states he did have a G-tube back in 1993 without complications. She would like to proceed with G-tube for long term nutrition.   Past Medical History  Diagnosis Date  . Tourette's syndrome   . Tic disorder   . Scoliosis   . Hemiatrophy of right leg   . Club foot     Right    Past Surgical History  Procedure Laterality Date  . Foot surgery Right 2002  . Eye muscle surgery Bilateral 1998 and 2002  . Femoral hernia repair      Done when he was an infant  . Harrington rod surgery  2003    For Scoliosis  . Tracheostomy tube placement N/A 04/07/2014    Procedure: TRACHEOSTOMY;  Surgeon: Flo Shanks, MD;  Location: [facility] OR;  Service: ENT;  Laterality: N/A;    Allergies: Review of patient's allergies indicates no known allergies.  Medications: Prior to Admission medications   Medication Sig Start Date End Date Taking? Authorizing Provider  amphetamine-dextroamphetamine (ADDERALL) 10 MG tablet Take 5 mg by mouth daily with breakfast.   Yes Historical Provider, MD  cloNIDine (CATAPRES) 0.1 MG tablet TAKE 3 TABLETS BY MOUTH AT BEDTIME 03/11/14  Yes Princella Ion, NP  Dexmethylphenidate HCl 35 MG CP24 Take 1 capsule every morning 03/11/14  Yes Princella Ion, NP     Family History  Problem Relation Age of Onset  . Breast cancer Maternal Grandmother     Died in her mid 46's  . Prostate  cancer Maternal Grandfather     Died in his 75's  . Hypertension      History   Social History  . Marital Status: Single    Spouse Name: N/A  . Number of Children: N/A  . Years of Education: N/A   Social History Main Topics  . Smoking status: Never Smoker   . Smokeless tobacco: Never Used  . Alcohol Use: No  . Drug Use: No  . Sexual Activity: No   Other Topics Concern  . None   Social History Narrative   Review of Systems: Unable to be obtained at this time, patient is on vent.   Review of Systems  Vital Signs: BP 112/79 mmHg  Pulse 103  Temp(Src) 98.3 F (36.8 C) (Axillary)  Resp 14  Ht 4\' 10"  (1.473 m)  Wt 134 lb 4.2 oz (60.9 kg)  BMI 28.07 kg/m2  SpO2 97%  Physical Exam General: NAD, tracheostomy on Vent intact Heart: Tachycardic without M/G/R Lungs: Diminished BS throughout Abd: Old G-tube indentation well healed, soft, NT, ND, (+) BS  Mallampati Score:  MD Evaluation Airway: Other (comments) Airway comments: Tracheostomy intact Heart: WNL Abdomen: WNL Chest/ Lungs: WNL ASA  Classification: 3 Mallampati/Airway Score:  (Tracheostomy intact)  Imaging: Ct Angio Chest Pe W/cm &/or Wo Cm  03/31/2014   CLINICAL DATA:  Shortness of breath, hypoxia  EXAM: CT ANGIOGRAPHY CHEST WITH CONTRAST  TECHNIQUE: Multidetector CT imaging of the chest was performed using the standard protocol during bolus administration of intravenous contrast. Multiplanar CT image reconstructions and MIPs were obtained to evaluate the vascular anatomy.  CONTRAST:  80mL OMNIPAQUE IOHEXOL 350 MG/ML SOLN  COMPARISON:  None.  FINDINGS: Evaluation is severely limited due to motion degradation as well as streak artifact from the patient's spinal fixation hardware.  Within that constraint, there is no evidence of pulmonary embolism.  Mediastinum/Nodes: Cardiomegaly.  No pericardial effusion.  No suspicious mediastinal, hilar, or axillary lymphadenopathy.  Lungs/Pleura: Evaluation of the lung  parenchyma is constrained by respiratory motion.  Mild patchy opacity in the lingula/lateral left lower lobe and superior right lower lobe, likely atelectasis.  No pleural effusion or pneumothorax.  Upper abdomen: Visualized upper abdomen is unremarkable.  Musculoskeletal: Thoracolumbar scoliosis with spinal fixation rods. Multiple chronic rib deformities.  Review of the MIP images confirms the above findings.  IMPRESSION: Superior limited evaluation due to motion degradation.  No evidence of pulmonary embolism.  No evidence of acute cardiopulmonary disease.   Electronically Signed   By: Charline Bills M.D.   On: 03/31/2014 21:00   Dg Chest Port 1 View  04/13/2014   CLINICAL DATA:  24 year old male with placement of feeding tube. Initial encounter.  EXAM: PORTABLE CHEST - 1 VIEW  COMPARISON:  04/09/2014.  FINDINGS: Radiopaque tip of the feeding tube mid to lower esophagus level. This does not deviated laterally to suggest this is entering the mainstem bronchus.  Tracheostomy in place. Prior extensive spine surgery. Deformity of the chest as previously noted.  Cardiomegaly.  Pulmonary vascular prominence.  Difficult on the present exam to exclude perihilar infiltrate although I suspect findings are related to pulmonary vascular prominence.  No gross pneumothorax.  IMPRESSION: Radiopaque tip of the feeding tube mid to lower esophagus level.  Please see above.   Electronically Signed   By: Lacy Duverney M.D.   On: 04/13/2014 12:27   Dg Chest Port 1 View  04/09/2014   CLINICAL DATA:  Pneumonia  EXAM: PORTABLE CHEST - 1 VIEW  COMPARISON:  04/08/2014  FINDINGS: Cardiomegaly again noted. Stable thoracostomy tube position. Metallic fixation rods thoracolumbar spine. Slight improvement in aeration. Central mild vascular congestion without convincing pulmonary edema. Persistent small left pleural effusion with left basilar atelectasis or infiltrate.  IMPRESSION: Slight improved aeration. Central mild vascular  congestion without convincing pulmonary edema. Persistent small left pleural effusion with left basilar atelectasis or infiltrate.   Electronically Signed   By: Natasha Mead M.D.   On: 04/09/2014 07:58   Dg Chest Port 1 View  04/08/2014   CLINICAL DATA:  Acute respiratory failure and axial or chest x-ray of April 07, 2014  EXAM: PORTABLE CHEST - 1 VIEW  COMPARISON:  Portable chest x-ray of April 07, 2014 1343 hr  FINDINGS: The lung volumes are low. The pulmonary interstitium is more dense bilaterally. The tracheostomy appliance tip lies at the level of the inferior margin of the clavicular heads approximately 3.7 cm above the crotch of the carina. The cardiac silhouette remains enlarged. The right heart border and the hemidiaphragms are less distinct today. The esophagogastric tube tip projects below the inferior margin of the image.  IMPRESSION: Bilateral hypoinflation with progressive increase in interstitial edema. Lower lobe atelectasis and/or pleural effusion on the left is suspected.   Electronically Signed   By: David  [place]   On: 04/08/2014 07:23   Dg Chest Port 1 View  04/07/2014   CLINICAL DATA:  Tracheostomy and orogastric tube placement  EXAM: PORTABLE CHEST - 1 VIEW  COMPARISON:  Portable chest x-ray of April 07, 2014  FINDINGS: A tracheostomy has been placed following removal of the endotracheal tube. The tip of the tracheostomy appliance is approximately 3.3 cm above the carina. An esophagogastric tube is present whose tip and proximal port project below the expected level of the GE junction. The examination is otherwise stable.  IMPRESSION: Interval placement of a tracheostomy appliance and an esophagogastric tube with good positioning radiographically.   Electronically Signed   By: David  [place]   On: 04/07/2014 15:45   Dg Chest Port 1 View  04/07/2014   CLINICAL DATA:  Pneumonia.  EXAM: PORTABLE CHEST - 1 VIEW  COMPARISON:  04/06/2014.  FINDINGS: Endotracheal tube and NG tube in stable position.  Stable cardiomegaly. No pulmonary venous congestion on today's exam. Left lower lobe atelectasis and or infiltrate. No pneumothorax. Prior spinal fusion. Stable deformity of the chest wall.  IMPRESSION: 1. Lines and tubes in stable position. 2. Stable cardiomegaly. No evidence of pulmonary venous congestion on today's exam. 3. Persistent left lower lobe atelectasis and/or infiltrate. Left lower lobe pneumonia should be considered.   Electronically Signed   By: Maisie Fus  Register   On: 04/07/2014 07:18   Dg Chest Port 1 View  04/06/2014   CLINICAL DATA:  Pneumonia.  EXAM: PORTABLE CHEST - 1 VIEW  COMPARISON:  04/05/2014.  FINDINGS: Endotracheal tube and NG tube in stable position. Persistent severe cardiomegaly. Interim slight clearing of bilateral pulmonary infiltrates consistent with partial clearing of pulmonary edema and/or pneumonia. No pleural effusion or pneumothorax. Prior cervicothoracic and thoracolumbar spine fusion.  IMPRESSION: 1. Lines and tubes in stable position. 2. Persistent severe cardiomegaly with interim partial clearing of bilateral pulmonary infiltrates consistent with partial clearing of pulmonary edema and/or pneumonia.   Electronically Signed   By: Maisie Fus  Register   On: 04/06/2014 07:34   Dg Chest Port 1 View  04/05/2014   CLINICAL DATA:  Acute respiratory failure, shortness of breath  EXAM: PORTABLE CHEST - 1 VIEW  COMPARISON:  Portable chest x-ray of April 04, 2014  FINDINGS: The lung volumes remain low. The cardiac silhouette remains enlarged. The retrocardiac region remains dense and the left hemidiaphragm obscured. Somewhat increased interstitial density in the right mid and lower lung has developed. The endotracheal tube tip lies 6.7 cm above the carina. The esophagogastric tube tip projects below the inferior margin of the image. The proximal port is at the level of the GE junction. The Harrington rods are unchanged in appearance.  IMPRESSION: There has been slight interval  deterioration in the appearance of the pulmonary interstitium consistent with pulmonary interstitial edema with bibasilar atelectasis or pneumonia.   Electronically Signed   By: David  [place]   On: 04/05/2014 07:17   Dg Chest Port 1 View  04/04/2014   CLINICAL DATA:  Acute respiratory failure  EXAM: PORTABLE CHEST - 1 VIEW  COMPARISON:  Radiograph 04/03/2014  FINDINGS: Endotracheal tip is difficult to define again posterior spinal fusion. NG tube extends to the stomach. Stable cardiac silhouette low lung volumes. Left basilar atelectasis.  IMPRESSION: 1. No interval change. 2. Support apparatus appears stable. 3. Low lung volumes and left basilar atelectasis.   Electronically Signed   By: Genevive Bi M.D.   On: 04/04/2014 09:07   Dg Chest Port 1 View  04/03/2014   CLINICAL DATA:  ETT adjusted  EXAM: PORTABLE CHEST - 1 VIEW  COMPARISON:  04/03/2014  FINDINGS: Endotracheal tube is poorly visualized but likely terminates 5 cm above the carina.  Low lung volumes. Left lung base is obscured. No pleural effusion or pneumothorax.  Cardiomegaly.  Enteric tube terminates in the stomach.  Multiple rib deformities, chronic.  Thoracic spinal rods.  IMPRESSION: Endotracheal tube is poorly visualized but likely terminates 5 cm above the carina.   Electronically Signed   By: Charline BillsSriyesh  Krishnan M.D.   On: 04/03/2014 13:42   Dg Chest Port 1 View  04/03/2014   CLINICAL DATA:  Shortness of breath  EXAM: PORTABLE CHEST - 1 VIEW  COMPARISON:  04/02/2014  FINDINGS: Nasogastric catheter and endotracheal tube are again identified and stable in appearance. Significant postoperative change in the thoracic spine is noted. Chest wall deformity is again seen and stable. The overall inspiratory effort is poor. Cardiac shadow remains enlarged. No focal confluent infiltrate is seen.  IMPRESSION: Poor inspiratory effort.  No definitive acute abnormality is noted.  Tubes and lines as described.   Electronically Signed   By: Alcide CleverMark  Lukens  M.D.   On: 04/03/2014 06:48   Dg Chest Port 1 View  04/02/2014   CLINICAL DATA:  Hypoxia  EXAM: PORTABLE CHEST - 1 VIEW  COMPARISON:  April 01, 2014  FINDINGS: The tip of the endotracheal tube is difficult to confirm given the overlying metallic fixation in the thoracic spine. The endotracheal tube does not extend to the level of the carina and is probably not significantly changed compared to 1 day prior. Nasogastric tube extends below the diaphragm. No pneumothorax. There is stable cardiomegaly. There is no appreciable edema or consolidation. The pulmonary vascularity is normal. There is evidence of old rib trauma on the right.  IMPRESSION: The tip of the endotracheal tube is difficult to discern due to the overlying metallic fixation in the thoracic spine. The tip is above the carina. Nasogastric tube tip and side port below the diaphragm. No pneumothorax. Stable cardiomegaly. No edema or consolidation.   Electronically Signed   By: Bretta BangWilliam  Woodruff III M.D.   On: 04/02/2014 07:13   Dg Chest Port 1 View  04/01/2014   CLINICAL DATA:  Assess endotracheal tube position  EXAM: PORTABLE CHEST - 1 VIEW  COMPARISON:  Portable chest x-ray of March 31, 2014  FINDINGS: The endotracheal tube tip is obscured by the superimposition of the spinal hardware. The tip may lie at the level of the inferior margin of the clavicular heads which would place it 4.3 cm above thecarina. The esophagogastric tube tip projects below the inferior margin of the image. There are cardiac monitoring leads present. The lung volumes remain low. The retrocardiac region remains dense.  IMPRESSION: As best as can be determined the tip of the endotracheal tube lies approximately 4.3 cm above the crotch of the carina. There has not been significant interval change in the appearance of the chest since yesterday's study. Persistent bibasilar atelectasis or pneumonia.   Electronically Signed   By: David  [place]   On: 04/01/2014 07:35   Dg  Chest Port 1 View  03/31/2014   CLINICAL DATA:  Endotracheal tube and central line placements.  EXAM: PORTABLE CHEST - 1 VIEW  COMPARISON:  03/31/2014  FINDINGS: Postoperative changes with posterior fixation in the thoracolumbar spine. Fixation hardware limits visualization of the mediastinum. Endotracheal tube tip is visualized and appears to rest about 2.4 cm above the carinal. Low lung volumes. Cardiac enlargement. Suggestion of infiltration or atelectasis in the right mid lung. Left lung base is  obscured. No pneumothorax. Multiple rib deformities.  IMPRESSION: Enteric tube tip measures 2.4 cm above the carina. Otherwise no change in appearance to the chest. Cardiac enlargement. Possible infiltration or atelectasis in the right base.   Electronically Signed   By: Burman Nieves M.D.   On: 03/31/2014 23:11   Dg Chest Portable 1 View  03/31/2014   CLINICAL DATA:  Extreme shortness of breath  EXAM: PORTABLE CHEST - 1 VIEW  COMPARISON:  01/30/2013  FINDINGS: Low lung volumes.  Left lung base is obscured.  Cardiomegaly.  Multiple rib deformities, chronic.  Harrington spinal fixation rods.  IMPRESSION: Low lung volumes.  No evidence of acute cardiopulmonary disease.   Electronically Signed   By: Charline Bills M.D.   On: 03/31/2014 18:39   Dg Abd Portable 1v  04/12/2014   CLINICAL DATA:  Feeding tube placement.  EXAM: PORTABLE ABDOMEN - 1 VIEW  COMPARISON:  Single view of the abdomen earlier this same date.  FINDINGS: The patient's feeding tube has back out. The tip is now in the distal stomach directed toward the duodenum.  IMPRESSION: As above.   Electronically Signed   By: Drusilla Kanner M.D.   On: 04/12/2014 18:30   Dg Abd Portable 1v  04/12/2014   CLINICAL DATA:  Feeding tube placement.  EXAM: PORTABLE ABDOMEN - 1 VIEW  COMPARISON:  04/01/2014  FINDINGS: Feeding tube is in place with the tip in the third portion of the duodenum. Nonobstructive bowel gas pattern.  IMPRESSION: Feeding tube tip in the  third portion of the duodenum.   Electronically Signed   By: Charlett Nose M.D.   On: 04/12/2014 17:00   Dg Abd Portable 1v  04/01/2014   CLINICAL DATA:  OG tube placement  EXAM: PORTABLE ABDOMEN - 1 VIEW  COMPARISON:  None.  FINDINGS: Enteric tube in place, tip and side port below the diaphragm in the stomach. There are no dilated bowel loops to suggest bowel obstruction. Small volume of colonic stool. A catheter projects over the pelvis. Extensive thoracolumbar fusion hardware is present.  IMPRESSION: Tip and side port of the enteric tube below the diaphragm in the stomach.   Electronically Signed   By: Rubye Oaks M.D.   On: 04/01/2014 02:07   Dg C-arm 61-120 Min-no Report  04/12/2014   CLINICAL DATA: panda tube insertion   C-ARM 61-120 MINUTES  Fluoroscopy was utilized by the requesting physician.  No radiographic  interpretation.     Labs:  CBC:  Recent Labs  04/07/14 0320 04/07/14 1108 04/08/14 0231 04/09/14 0416 04/14/14 0414  WBC 9.9  --  7.5 9.8 11.2*  HGB 14.8 14.6 13.1 13.7 16.2  HCT 50.4 43.0 45.0 45.1 55.7*  PLT 230  --  260 269 300    BMP:  Recent Labs  04/09/14 0416 04/13/14 0723 04/13/14 1720 04/14/14 0414  NA 146* 164* 162* 154*  K 4.2 3.7 3.4* 4.2  CL 103 113* 112 110  CO2 29 36* 32 34*  GLUCOSE 136* 146* 189* 177*  BUN 22 109* 111* 94*  CALCIUM 9.3 10.5 9.5 8.8  CREATININE 0.86 1.98* 1.99* 1.66*  GFRNONAA >90 46* 46* 57*  GFRAA >90 53* 53* 66*    LIVER FUNCTION TESTS:  Recent Labs  04/06/14 0222  BILITOT 0.6  AST 55*  ALT 30  ALKPHOS 48  PROT 6.7  ALBUMIN 3.2*   Assessment and Plan: Respiratory failure s/p tracheostomy intact Scoliosis  Right hemiatrophy  Micrognathia  HCAP treated with antibiotics  2/25-3/2, increase in wbc now added Vancomycin and Cefepime 3/8, afebrile Dysphagia with need for long term nutrition, previously had G-tube with scar well healed 1993, pulled out NGT recently Request for image guided percutaneous  gastrostomy tube placement with moderate sedation CT imaging reviewed and approved for percutaneous approach Will wait until wbc stable or trending down and afebrile to proceed with new antibiotics added Plan for tentative Friday 3/11 Risks and Benefits discussed with the patient's mother including, but not limited to the need for a barium enema during the procedure, bleeding, infection, peritonitis, or damage to adjacent structures. All questions were answered, patient's mother is agreeable to proceed. Consent signed and in chart.   Thank you for this interesting consult.  I greatly enjoyed meeting Tavious Huebert and look forward to participating in their care.  SignedBerneta Levins 04/14/2014, 10:50 AM   I spent a total of 40 Minutes in face to face in clinical consultation, greater than 50% of which was counseling/coordinating care for dysphagia and need for long term nutrition.

## 2014-04-14 NOTE — Progress Notes (Signed)
PULMONARY / CRITICAL CARE MEDICINE   Name: [name] MRN: [id number] DOB: [birth year]    ADMISSION DATE:  03/31/2014 CONSULTATION DATE:  04/14/2014  REFERRING MD :  EDP  CHIEF COMPLAINT:  SOB  INITIAL PRESENTATION:  24 y.o. M brought to [facility] ED 2/24 with SOB.  In ED, he was hypoxic with sats in 60's.  He was placed on BiPAP but repeat ABG 3 hours later showed progressive respiratory decline.  Given his hx of cervical fusion, micrognathia, prior trach, etc. (which all predispose him to difficult airway), he was intubated prior to ICU admission.  STUDIES:  CTA chest 2/24 >>> negative for PE, limited study due to motion.  SIGNIFICANT EVENTS: 2/24 Admit 2/27 Awake, follows commands on fent / precedex  2/28 Remains on PRVC, agitation / sedation issues.  Weaned briefly on 15/5 3/02 Trach placement [facility]) 3/04 Lots of agitation. Bedside sitter ordered. PT ordered. Tolerated PS 20 cm H2O 3/8 Severe agitation despite frequent sedation PRN push.   SUBJECTIVE:  Less agitation today after med regimen changes 3/8. Fevers have resolved.  VITAL SIGNS: Temp:  [98 F (36.7 C)-100.9 F (38.3 C)] 98.3 F (36.8 C) (03/09 0800) Pulse Rate:  [102-137] 105 (03/09 1119) Resp:  [14-25] 22 (03/09 1119) BP: (102-138)/(63-97) 112/79 mmHg (03/09 0800) SpO2:  [93 %-98 %] 96 % (03/09 1119) FiO2 (%):  [40 %] 40 % (03/09 1119) Weight:  [60.9 kg (134 lb 4.2 oz)] 60.9 kg (134 lb 4.2 oz) (03/09 0422)   HEMODYNAMICS:     VENTILATOR SETTINGS: Vent Mode:  [-] PRVC FiO2 (%):  [40 %] 40 % Set Rate:  [14 bmp] 14 bmp Vt Set:  [360 mL] 360 mL PEEP:  [5 cmH20] 5 cmH20 Plateau Pressure:  [17 cmH20-21 cmH20] 17 cmH20   INTAKE / OUTPUT: Intake/Output      03/08 0701 - 03/09 0700 03/09 0701 - 03/10 0700   I.V. (mL/kg) 1932 (31.7)    IV Piggyback 53.8    Total Intake(mL/kg) 1985.8 (32.6)    Urine (mL/kg/hr) 725 (0.5) 450 (1.6)   Total Output 725 450   Net +1260.8 -450          PHYSICAL  EXAMINATION: General: young male with trach on vent in NAD Neuro: much less agitation today, moves all ext equal HEENT:  NAD, tracheostomy site intact Cardiovascular: s1 s2 Reg, no M.  Lungs: anterior reduced, barrel chest Abdomen: Soft, NT Ext: R club foot, R hemiatrophy, no edema   LABS: I have reviewed all of today's lab results. Relevant abnormalities are discussed in the A/P section  ASSESSMENT / PLAN:  PULMONARY OETT 2/24 >> 3/03 [facility]) 3/03 >>  A: Acute hypercarbic and hypoxic respiratory failure Restrictive physiology due to severe scoliosis Trach status ARDS P:   With improvement in neurostatus, wean Start weaning attempts at PS 15 Pos balance goal If fails wean with improved neurostatus then pcxr in am   CARDIOVASCULAR A:  Hypertension Tachy improved P:  Clonidine  Telemetry monitoring Correcting na If tachy after correction na, then add BB if MAp allows  RENAL A:   AKI Hypernatremia (164 on 3/8) Over diuresis 3/8 P:   Monitor BMET intermittently, at least bmet to avoid rapid rise Monitor I/Os Correct electrolytes as indicated Free water increase to q 8 hours if has NGT Continue D5W @ 125/hr, maintain  GASTROINTESTINAL A:   Constipation Dysphagia due to trach status Difficult NGT placement P:   SUP: Pantoprazole. PEG to be placed 3/11 Consider  NGT placement as sedation now better controlled TFs when enteral route obtained  HEMATOLOGIC A:   DVT prevention P:  DVT px: SQ heparin Monitor CBC intermittently Transfuse per usual ICU guidelines   INFECTIOUS A:   Suspected CAP, treated Poor weaning, fevers, persistent infiltrates P:   Rocephin, start date 2/25>>>2/29 Azithro, start date 2/25>>>2/29 Ceftaz 2/29>>>3/2 Follow fever curves  Sputum 3/8 >>> Cefepime 3/8 >>> Vanc 3/8 >>>  Improved after new ABX Follow sputum If no mrsa in am , then dc vanc  ENDOCRINE A:   No known issues P:   Goal glucose  140-180  NEUROLOGIC A:   Acute metabolic encephalopathy Agitation and uncooperativeness  Hx Insomnia, Tourette's syndrome, tic disorder, scoliosis s/p surgical fixation, right hemiatrophy, right club foot  Apparent extrapyramidal reaction to Haldol. Discussed with pharmacy. Resolved off Haldol. Delirium, refrcatory P:   RASS goal: 0 Cont clonazepam Cont fent patch Scheduled ativan Versed and fentanyl PRN Avoiding dop antagonists Sitter @ bedside  Depakote per pharmacy - has made a huge difference Na correcting  Global: Doing much better agitation wise today. Depakote and scheduled ativan have worked well for him. NA correcting. Fevers resolved after ABX re-started 3/8.  Care management reports that he is not a candidate for LTACH/SSH. Mother is open to idea of taking him home with vent although unclear how that would be possible at this time.  Joneen RoachPaul Hoffman, AGACNP-BC [facility] Pager [phone] or [phone]    STAFF NOTE: [name], [name], MD FACP have personally reviewed patient's available data, including medical history, events of note, physical examination and test results as part of my evaluation. I have discussed with resident/NP and other care providers such as pharmacist, RN and RRT. In addition, I personally evaluated patient and elicited key findings of: Major improved with Depakote, maintain, continued correction na, lft follow up, peg Friday?, re attempt ngt and feeds pre PEG, bmet in am, maintain current abxm, dc vanc in am if no [employer]  [name] J. Tyson AliasFeinstein, MD, FACP Pgr: [phone] [facility] 04/14/2014 12:32 PM

## 2014-04-14 NOTE — Progress Notes (Signed)
Physical Therapy Treatment Patient Details Name: [name] MRN: [id number] DOB: 05/07/1990 Today's Date: 04/14/2014    History of Present Illness Patient is a 24 yo male admitted 03/31/14 with respiratory failure, hypoxia (in 60's in ED) and was intubated.   Had trach on 04/07/14.  Recent agitation/confusion.  Patient with waist restraint and mitts.  PMH:  Scoliosis, HTN, Tourette's syndrome, Rt hemiatrophy RLE, Rt club foot, cervical fusion.    PT Comments    Pt progressing with mobility, was not as agitated today and was able to stand EOB and transfer to recliner. Pt needing min A for mobility, +2 for lines and safety because pt continues to be impulsive.   Follow Up Recommendations  LTACH;Supervision/Assistance - 24 hour     Equipment Recommendations  Wheelchair (measurements PT);Rolling walker with 5" wheels    Recommendations for Other Services       Precautions / Restrictions Precautions Precautions: Fall Precaution Comments: Trach, on vent Restrictions Weight Bearing Restrictions: No Other Position/Activity Restrictions: pt with wrist restraints, posey, and mitts    Mobility  Bed Mobility Overal bed mobility: Needs Assistance Bed Mobility: Supine to Sit     Supine to sit: Supervision     General bed mobility comments: supervision and vc's to control movement so that lines could be managed  Transfers Overall transfer level: Needs assistance Equipment used: 2 person hand held assist Transfers: Sit to/from Stand;Stand Pivot Transfers Sit to Stand: +2 safety/equipment;Min assist Stand pivot transfers: +2 safety/equipment;Min assist       General transfer comment: pt sliding hips forward when sitting EOB without realizing that he was about to slide of bed, frequent cues needed for safety. Min A +2 for standing, bilateral knees maintained in slight fleixon. Min A for pivot with vc;s for safety and controlled sitting. Stood several times from bed and recliner for  strangthening.   Ambulation/Gait             General Gait Details: plan to ambulate next sessoin with RT   Stairs            Wheelchair Mobility    Modified Rankin (Stroke Patients Only)       Balance Overall balance assessment: Needs assistance Sitting-balance support: No upper extremity supported;Feet supported Sitting balance-Leahy Scale: Poor Sitting balance - Comments: min A needed for pt to not scoot off EOB Postural control: Posterior lean Standing balance support: Bilateral upper extremity supported;During functional activity Standing balance-Leahy Scale: Poor Standing balance comment: needs unilateral support for safe standing                    Cognition Arousal/Alertness: Awake/alert Behavior During Therapy: Impulsive Overall Cognitive Status: Difficult to assess                      Exercises General Exercises - Lower Extremity Ankle Circles/Pumps: AROM;Both;10 reps;Supine    General Comments        Pertinent Vitals/Pain Pain Assessment: No/denies pain    Home Living                      Prior Function            PT Goals (current goals can now be found in the care plan section) Acute Rehab PT Goals Patient Stated Goal: None stated PT Goal Formulation: With patient Time For Goal Achievement: 04/25/14 Potential to Achieve Goals: Good Progress towards PT goals: Progressing toward goals    Frequency  Min 3X/week    PT Plan Current plan remains appropriate    Co-evaluation             End of Session Equipment Utilized During Treatment: Gait belt;Oxygen Activity Tolerance: Patient tolerated treatment well Patient left: in chair;with call bell/phone within reach;with nursing/sitter in room;with family/visitor present;with restraints reapplied     Time: 1610-9604 PT Time Calculation (min) (ACUTE ONLY): 29 min  Charges:  $Therapeutic Activity: 23-37 mins                    G Codes:     Lyanne Co, PT  Acute Rehab Services  [phone]  [place], [place] 04/14/2014, 3:22 PM

## 2014-04-15 LAB — BASIC METABOLIC PANEL
Anion gap: 8 (ref 5–15)
BUN: 52 mg/dL — ABNORMAL HIGH (ref 6–23)
CHLORIDE: 103 mmol/L (ref 96–112)
CO2: 36 mmol/L — ABNORMAL HIGH (ref 19–32)
Calcium: 8.5 mg/dL (ref 8.4–10.5)
Creatinine, Ser: 1.04 mg/dL (ref 0.50–1.35)
GFR calc non Af Amer: >90 mL/min (ref >90)
Glucose, Bld: 142 mg/dL — ABNORMAL HIGH (ref 70–99)
POTASSIUM: 2.7 mmol/L — AB (ref 3.5–5.1)
Sodium: 147 mmol/L — ABNORMAL HIGH (ref 135–145)

## 2014-04-15 LAB — CBC
HCT: 46.9 % (ref 39.0–52.0)
HEMOGLOBIN: 13.7 g/dL (ref 13.0–17.0)
MCH: 28.7 pg (ref 26.0–34.0)
MCHC: 29.2 g/dL — ABNORMAL LOW (ref 30.0–36.0)
MCV: 98.1 fL (ref 78.0–100.0)
PLATELETS: 312 10*3/uL (ref 150–400)
RBC: 4.78 MIL/uL (ref 4.22–5.81)
RDW: 13.6 % (ref 11.5–15.5)
WBC: 7.1 10*3/uL (ref 4.0–10.5)

## 2014-04-15 LAB — GLUCOSE, CAPILLARY
GLUCOSE-CAPILLARY: 140 mg/dL — AB (ref 70–99)
GLUCOSE-CAPILLARY: 145 mg/dL — AB (ref 70–99)
Glucose-Capillary: 120 mg/dL — ABNORMAL HIGH (ref 70–99)
Glucose-Capillary: 101 mg/dL — ABNORMAL HIGH (ref 70–99)
Glucose-Capillary: 89 mg/dL (ref 70–99)
Glucose-Capillary: 109 mg/dL — ABNORMAL HIGH (ref 70–99)

## 2014-04-15 LAB — PROTIME-INR
INR: 1.2 (ref 0.00–1.49)
Prothrombin Time: 15.3 seconds — ABNORMAL HIGH (ref 11.6–15.2)

## 2014-04-15 MED ORDER — POTASSIUM CHLORIDE 10 MEQ/100ML IV SOLN
10.0000 meq | INTRAVENOUS | Status: AC
  Administered 2014-04-15: 10 meq via INTRAVENOUS

## 2014-04-15 MED ORDER — POTASSIUM CHLORIDE 10 MEQ/100ML IV SOLN
10.0000 meq | INTRAVENOUS | Status: AC
  Administered 2014-04-15 (×2): 10 meq via INTRAVENOUS
  Filled 2014-04-15 (×3): qty 100

## 2014-04-15 NOTE — Progress Notes (Signed)
CRITICAL VALUE ALERT  Critical value received: potassium 2.7  Date of notification:  04/15/2014  Time of notification:  0800  Critical value read back:Yes.    Nurse who received alert:  Lora Paulaarolyn Cloe Sockwell, RN  MD notified (1st page):   Time of first page:   MD notified (2nd page):  Time of second page:  Responding MD: Devra DoppSteve Minor, PA   Time MD responded: 0830

## 2014-04-15 NOTE — Progress Notes (Signed)
PULMONARY / CRITICAL CARE MEDICINE   Name: [name] MRN: [id number] DOB: [birth year]    ADMISSION DATE:  03/31/2014 CONSULTATION DATE:  04/15/2014  REFERRING MD :  EDP  CHIEF COMPLAINT:  SOB  INITIAL PRESENTATION:  24 y.o. M brought to [facility] ED 2/24 with SOB.  In ED, he was hypoxic with sats in 60's.  He was placed on BiPAP but repeat ABG 3 hours later showed progressive respiratory decline.  Given his hx of cervical fusion, micrognathia, prior trach, etc. (which all predispose him to difficult airway), he was intubated prior to ICU admission.  STUDIES:  CTA chest 2/24 >>> negative for PE, limited study due to motion.  SIGNIFICANT EVENTS: 2/24 Admit 2/27 Awake, follows commands on fent / precedex  2/28 Remains on PRVC, agitation / sedation issues.  Weaned briefly on 15/5 3/02 Trach placement [facility]) 3/04 Lots of agitation. Bedside sitter ordered. PT ordered. Tolerated PS 20 cm H2O 3/8 Severe agitation despite frequent sedation PRN push.   SUBJECTIVE:  k low, MS remains great on depakote  VITAL SIGNS: Temp:  [96.9 F (36.1 C)-98.1 F (36.7 C)] 96.9 F (36.1 C) (03/10 0830) Pulse Rate:  [75-105] 75 (03/10 0830) Resp:  [14-22] 14 (03/10 0830) BP: (96-118)/(40-72) 113/66 mmHg (03/10 0830) SpO2:  [96 %-100 %] 99 % (03/10 0830) FiO2 (%):  [40 %] 40 % (03/10 0755) Weight:  [63.8 kg (140 lb 10.5 oz)] 63.8 kg (140 lb 10.5 oz) (03/10 0500)   HEMODYNAMICS:     VENTILATOR SETTINGS: Vent Mode:  [-] PRVC FiO2 (%):  [40 %] 40 % Set Rate:  [14 bmp] 14 bmp Vt Set:  [360 mL] 360 mL PEEP:  [5 cmH20] 5 cmH20 Plateau Pressure:  [17 cmH20-23 cmH20] 18 cmH20   INTAKE / OUTPUT: Intake/Output      03/09 0701 - 03/10 0700 03/10 0701 - 03/11 0700   I.V. (mL/kg) 3000 (47)    IV Piggyback 107.5    Total Intake(mL/kg) 3107.5 (48.7)    Urine (mL/kg/hr) 1100 (0.7)    Total Output 1100     Net +2007.5            PHYSICAL EXAMINATION: General: young male with trach on vent in NAD Neuro:  resolved agitation, moves all ext equal HEENT:  NAD, tracheostomy site intact Cardiovascular: s1 s2 Reg, no M.  Lungs: anterior reduced, barrel chest Abdomen: Soft, NT Ext: R club foot, R hemiatrophy, no edema, no changes   LABS: I have reviewed all of today's lab results. Relevant abnormalities are discussed in the A/P section  ASSESSMENT / PLAN:  PULMONARY OETT 2/24 >> 3/03 Janina Mayo [facility]) 3/03 >>  A: Acute hypercarbic and hypoxic respiratory failure Restrictive physiology due to severe scoliosis Trach status ARDS P:   With improvement in neurostatus, wean aggresive weaning attempts at PS 10, goal is some spont breathing 4 hrs Pos balance remains as goal pcxr required in am with stagnant weaning  CARDIOVASCULAR A:  Hypertension Tachy improved daily with free water and correction aggitation P:  Clonidine  Telemetry monitoring Correcting na, improved with this  RENAL A:   AKI Hypernatremia (164 on 3/8) - resolving Over diuresis 3/8- resolving P:   Monitor BMET in am  Monitor I/Os Correct electrolytes as indicated Free water increase to q 8 hours if has NGT Continue D5W but reduce to 50 cc/hr  GASTROINTESTINAL A:   Constipation Dysphagia due to trach status Difficult NGT placement P:   SUP: Pantoprazole. PEG to be placed 3/11  TFs when enteral route obtained  HEMATOLOGIC A:   DVT prevention Dilutional anemia P:  DVT px: SQ heparin Monitor CBC intermittently  INFECTIOUS A:   Suspected CAP, treated Poor weaning, fevers, persistent infiltrates P:   Rocephin, start date 2/25>>>2/29 Azithro, start date 2/25>>>2/29 Ceftaz 2/29>>>3/2   Sputum 3/8 >>>NG Cefepime 3/8 >>> Vanc 3/8 >>>3/10  Improved after new ABX Follow sputum- NG Dc vanc Keep cefepime, consider empiric 7 days , as fevers resolved with this  ENDOCRINE A:   No known issues P:   Goal glucose 140-180  NEUROLOGIC A:   Acute metabolic encephalopathy Agitation and  uncooperativeness  Hx Insomnia, Tourette's syndrome, tic disorder, scoliosis s/p surgical fixation, right hemiatrophy, right club foot  Apparent extrapyramidal reaction to Haldol. Discussed with pharmacy. Resolved off Haldol. Delirium, refrcatory P:   RASS goal: 0 Cont clonazepam Cont fent patch Scheduled ativan, can reduce or dc as additional benzo still on board Versed and fentanyl PRN Avoiding dop antagonists Sitter @ bedside  Depakote per pharmacy - has made a huge difference - keep , lft in future Na correcting, to goal 145  Care management reports that he is not a candidate for LTACH/SSH. Mother is open to idea of taking him home with vent although unclear how that would be possible at this time.  Mcarthur Rossettianiel J. Tyson AliasFeinstein, MD, FACP Pgr: [phone] [facility] 04/15/2014 10:00 AM

## 2014-04-15 NOTE — Progress Notes (Signed)
NUTRITION FOLLOW-UP  INTERVENTION: When PEG is placed, initiate Vital AF at 30 ml/hr and increase every 4 hours to goal rate of 55 ml/hr (1320 ml per day)   Provides 1584 kcal (98% estimated needs), 99 grams of protein, 1070 ml free water daily. Continue 250 ml free water flushes every 3 hours to provide a total of 1820 ml of water daily.   When tolerating continuous feeds via PEG, recommend transitioning to bolus feeds. Provide 1.5 cans of Jevity 1.2 QID to provide 1728 kcal, 80 grams of protein, and 1166 ml of water  NUTRITION DIAGNOSIS: Inadequate oral intake related to inability to eat as evidenced by NPO status; ongoing  Goal: Pt to meet >/= 90% of estimated energy requirements; not being met  Monitor:  Tube feed tolerance/adeqaucy, weight, labs  ASSESSMENT:  24 y/o male brought to [facility] ED with SOB. He was intubated prior to ICU admission. PMH of Tourette's, Tic disorder, Scoliosis, hemiatrophy of right leg, and Club foot.   Patient now has trach, remains on Vent support. Patient no longer has NGT in place. Per MD note, plan for PEG placement tomorrow. Patient rubbing belly and reporting being hungry at time of visit. Patient's weight has dropped 9 lbs since admission. TF will be re-started when enteral route obtained.   Current TF order is for Vital AF @ 55 ml/hr with 250 ml free water flushes every 8 hours. Provides 1584 kcal, 99 grams of protein, 1820 ml free water daily.             Patient is currently intubated on ventilator support MV: 9 L/min Temp (24hrs), Avg:97.4 F (36.3 C), Min:96.7 F (35.9 C), Max:97.9 F (36.6 C)  Propofol: none   Height: Ht Readings from Last 1 Encounters:  03/31/14 _0  (1.473 m)    Weight: Wt Readings from Last 1 Encounters:  04/15/14 140 lb 10.5 oz (63.8 kg)  04/01/14 149 lb  BMI:  Body mass index is 29.4 kg/(m^2).   Estimated Nutritional Needs: Kcal: 1571 Protein: 80-100 grams Fluid: >/= 1.6 L daily  Skin: closed incision  on neck; +1 generalized edema  Diet Order: Diet NPO time specified Except for: Sips with Meds  Intake/Output Summary (Last 24 hours) at 04/15/14 1620 Last data filed at 04/15/14 0600  Gross per 24 hour  Intake 3107.5 ml  Output    650 ml  Net 2457.5 ml    Last BM: 3/3 per nursing notes  Labs:   Recent Labs Lab 04/13/14 1720 04/14/14 0414 04/15/14 0536  NA 162* 154* 147*  K 3.4* 4.2 2.7*  CL 112 110 103  CO2 32 34* 36*  BUN 111* 94* 52*  CREATININE 1.99* 1.66* 1.04  CALCIUM 9.5 8.8 8.5  GLUCOSE 189* 177* 142*    CBG (last 3)   Recent Labs  04/15/14 0043 04/15/14 0414 04/15/14 0757  GLUCAP 145* 120* 101*    Scheduled Meds: . antiseptic oral rinse  7 mL Mouth Rinse QID  . ceFEPime (MAXIPIME) IV  2 g Intravenous Q24H  . chlorhexidine  15 mL Mouth Rinse BID  . clonazePAM  1 mg Per Tube BID  . cloNIDine  0.1 mg Per Tube TID  . enoxaparin (LOVENOX) injection  40 mg Subcutaneous Q24H  . fentaNYL  75 mcg Transdermal Q72H  . free water  250 mL Per Tube 3 times per day  . insulin aspart  0-9 Units Subcutaneous 6 times per day  . LORazepam  1 mg Intravenous Once  .  pantoprazole sodium  40 mg Per Tube Daily  . polyethylene glycol  17 g Oral Daily  . valproate sodium  375 mg Intravenous Q12H    Pryor Ochoa RD, LDN Inpatient Clinical Dietitian Pager: [phone] After Hours Pager: [phone]

## 2014-04-15 NOTE — Trach Care Team (Signed)
Trach Care Progression Note   Patient Details Name: [name] [name] MRN: [id number] DOB: 09/01/1990 Today's Date: 04/15/2014   Tracheostomy Assessment    Tracheostomy Shiley 6 mm Cuffed (Active)  Status Secured 04/15/2014 11:42 AM  Site Assessment Clean;Dry 04/15/2014 11:27 AM  Site Care Cleansed;Dried;Dressing applied 04/15/2014  3:53 AM  Inner Cannula Care Changed/new 04/15/2014  3:53 AM  Ties Assessment Secure 04/15/2014 11:42 AM  Cuff pressure (cm) 30 cm 04/15/2014  3:11 AM  Emergency Equipment at bedside Yes 04/15/2014 11:42 AM     Care Needs     Respiratory Therapy O2 Device: Ventilator FiO2 (%): 40 % SpO2: 100 % Education:  (not needed) Who was educated?:  (none at this time) Follow up recommendations:  (will follow for pt progress) Respiratory barriers to progression:  (pt remains on full vent support at this time)    Speech Language Pathology  SLP chart review complete: Patient ready for SLP services, Request order for PMSV evaluation (potentially ready for inline pmsv)   Physical Therapy PT Recommendation/Assessment: Patient needs continued PT services Follow Up Recommendations: LTACH, Supervision/Assistance - 24 hour PT equipment: Wheelchair (measurements PT), Rolling walker with 5" wheels    Occupational Therapy      Nutritional Patient's Current Diet: NPO Tube Feeding: Vital AF 1.2 Cal Tube Feeding Frequency: Continuous Tube Feeding Strength: Full strength    Case Management/Social Work Level of patient care prior to hospitalization: Home-Self care Living status: Family (document relation) Insurance payer: Medicaid Anticipated discharge disposition: Home-Home Civil engineer, contractingHealth    Provider  Trach Care Team/Provider                            Recommendations Trach Care Team Members Present-  Shon BatonJenna Holloman, SW,  Harlon DittyBonnie DeBlois, SLP  Anders SimmondsPete Babcock, NP Consider SLP evaluation; possible candidate for in line PMSV.        Antionetta Ator, Silva BandyDebra Anita (scribe for  team) 04/15/2014, 2:14 PM

## 2014-04-15 NOTE — Progress Notes (Signed)
Updated Aaron CanalesSteve Minor regarding Potassium 2.7 and orders given for K runs x 3

## 2014-04-16 ENCOUNTER — Inpatient Hospital Stay (HOSPITAL_COMMUNITY): Payer: Medicaid Other

## 2014-04-16 LAB — CULTURE, RESPIRATORY W GRAM STAIN: Culture: NORMAL

## 2014-04-16 LAB — BASIC METABOLIC PANEL
Anion gap: 12 (ref 5–15)
BUN: 39 mg/dL — ABNORMAL HIGH (ref 6–23)
CHLORIDE: 102 mmol/L (ref 96–112)
CO2: 30 mmol/L (ref 19–32)
CREATININE: 0.92 mg/dL (ref 0.50–1.35)
Calcium: 8.7 mg/dL (ref 8.4–10.5)
GFR calc non Af Amer: >90 mL/min (ref >90)
GLUCOSE: 104 mg/dL — AB (ref 70–99)
Potassium: 5.1 mmol/L (ref 3.5–5.1)
Sodium: 144 mmol/L (ref 135–145)

## 2014-04-16 LAB — GLUCOSE, CAPILLARY
GLUCOSE-CAPILLARY: 102 mg/dL — AB (ref 70–99)
GLUCOSE-CAPILLARY: 111 mg/dL — AB (ref 70–99)
Glucose-Capillary: 106 mg/dL — ABNORMAL HIGH (ref 70–99)
Glucose-Capillary: 110 mg/dL — ABNORMAL HIGH (ref 70–99)
Glucose-Capillary: 91 mg/dL (ref 70–99)
Glucose-Capillary: 89 mg/dL (ref 70–99)

## 2014-04-16 LAB — CULTURE, RESPIRATORY

## 2014-04-16 MED ORDER — MIDAZOLAM HCL 2 MG/2ML IJ SOLN
INTRAMUSCULAR | Status: AC | PRN
  Administered 2014-04-16 (×2): 2 mg via INTRAVENOUS

## 2014-04-16 MED ORDER — GLUCAGON HCL (RDNA) 1 MG IJ SOLR
INTRAMUSCULAR | Status: AC | PRN
  Administered 2014-04-16: 1 mg via INTRAVENOUS

## 2014-04-16 MED ORDER — MIDAZOLAM HCL 2 MG/2ML IJ SOLN
INTRAMUSCULAR | Status: AC
  Filled 2014-04-16: qty 4

## 2014-04-16 MED ORDER — IOHEXOL 300 MG/ML  SOLN
50.0000 mL | Freq: Once | INTRAMUSCULAR | Status: AC | PRN
  Administered 2014-04-16: 15 mL

## 2014-04-16 MED ORDER — FENTANYL CITRATE 0.05 MG/ML IJ SOLN
INTRAMUSCULAR | Status: AC
  Filled 2014-04-16: qty 4

## 2014-04-16 MED ORDER — FENTANYL CITRATE 0.05 MG/ML IJ SOLN
INTRAMUSCULAR | Status: AC | PRN
  Administered 2014-04-16: 25 ug via INTRAVENOUS
  Administered 2014-04-16: 100 ug via INTRAVENOUS

## 2014-04-16 MED ORDER — LIDOCAINE HCL 1 % IJ SOLN
INTRAMUSCULAR | Status: AC
  Filled 2014-04-16: qty 20

## 2014-04-16 NOTE — Progress Notes (Signed)
Physical Therapy Treatment Patient Details Name: Susan [name] MRN: [id number] DOB: 06/08/1990 Today's Date: 04/16/2014    History of Present Illness Patient is a 24 yo male admitted 03/31/14 with respiratory failure, hypoxia (in 60's in ED) and was intubated.   Had trach on 04/07/14.  Recent agitation/confusion.  Patient with waist restraint and mitts.  PMH:  Scoliosis, HTN, Tourette's syndrome, Rt hemiatrophy RLE, Rt club foot, cervical fusion.    PT Comments    Patient tolerated activity well today, remained on ATC with saturations 98-100%. Deferred ambulation at this time secondary to current weaning attempt from vent.  Patient tolerated OOB activity and functional tasks with less assist this session. Patient appears less agitated and more engaged in activities. Will continue to see as indicated and progress as tolerated. If patient remains on ATC will attempt ambulation next session.  Follow Up Recommendations  Supervision/Assistance - 24 hour     Equipment Recommendations  Wheelchair (measurements PT);Rolling walker with 5" wheels    Recommendations for Other Services       Precautions / Restrictions Precautions Precautions: Fall Precaution Comments: Trach, on vent Restrictions Weight Bearing Restrictions: No Other Position/Activity Restrictions: pt with wrist restraints, posey, and mitts    Mobility  Bed Mobility Overal bed mobility: Needs Assistance Bed Mobility: Supine to Sit     Supine to sit: Supervision     General bed mobility comments: Impulsive, assist for line management and cues to direct attention to lines for safety  Transfers Overall transfer level: Needs assistance Equipment used: None Transfers: Sit to/from BJ'sStand;Stand Pivot Transfers Sit to Stand: Min assist Stand pivot transfers: Min assist       General transfer comment: Min assist for stability, patient able to power up to standing and take shuffling pivotal steps to chair  Ambulation/Gait             General Gait Details: patient just transitioned to ATC, tolerating well, will hold ambulation to ensure continued tolerance for ATC.    Stairs            Wheelchair Mobility    Modified Rankin (Stroke Patients Only)       Balance   Sitting-balance support: No upper extremity supported Sitting balance-Leahy Scale: Fair     Standing balance support: No upper extremity supported Standing balance-Leahy Scale: Poor Standing balance comment: min assist for stability in static standing without UE support                    Cognition Arousal/Alertness: Awake/alert Behavior During Therapy: Impulsive Overall Cognitive Status: Difficult to assess                      Exercises      General Comments        Pertinent Vitals/Pain Pain Assessment: No/denies pain    Home Living                      Prior Function            PT Goals (current goals can now be found in the care plan section) Acute Rehab PT Goals Patient Stated Goal: None stated PT Goal Formulation: With patient Time For Goal Achievement: 04/25/14 Potential to Achieve Goals: Good Progress towards PT goals: Progressing toward goals    Frequency  Min 3X/week    PT Plan Current plan remains appropriate    Co-evaluation  End of Session Equipment Utilized During Treatment: Gait belt;Oxygen (ATC) Activity Tolerance: Patient tolerated treatment well Patient left: in chair;with call bell/phone within reach;with nursing/sitter in room;with family/visitor present     Time: 1336-1400 PT Time Calculation (min) (ACUTE ONLY): 24 min  Charges:  $Therapeutic Activity: 23-37 mins                    G CodesFabio Asa 04/22/14, 3:13 PM Charlotte Crumb, PT DPT  [phone]

## 2014-04-16 NOTE — Evaluation (Signed)
Clinical/Bedside Swallow Evaluation Patient Details  Name: [name] MRN: [id number] Date of Birth: 1990/12/29  Today's Date: 04/16/2014 Time: SLP Start Time (ACUTE ONLY): 0920 SLP Stop Time (ACUTE ONLY): 0955 SLP Time Calculation (min) (ACUTE ONLY): 35 min  Past Medical History:  Past Medical History  Diagnosis Date  . Tourette's syndrome   . Tic disorder   . Scoliosis   . Hemiatrophy of right leg   . Club foot     Right   Past Surgical History:  Past Surgical History  Procedure Laterality Date  . Foot surgery Right 2002  . Eye muscle surgery Bilateral 1998 and 2002  . Femoral hernia repair      Done when he was an infant  . Harrington rod surgery  2003    For Scoliosis  . Tracheostomy tube placement N/A 04/07/2014    Procedure: TRACHEOSTOMY;  Surgeon: Flo Shanks, MD;  Location: [facility] OR;  Service: ENT;  Laterality: N/A;   HPI:  24 y.o. M brought to [facility] ED 2/24 with SOB & hypoxic with sats in 60's. He was placed on BiPAP but repeat ABG 3 hours later showed progressive respiratory decline. Given his hx of cervical fusion, micrognathia, prior trach, etc. (which all predispose him to difficult airway), he was intubated prior to ICU admission. Trach placed on 3/2 by ENT. Per grandmother pt has a history of trach from birth to 74. Prior to admission pt eating and drinking WNL, speech WNL other than cogntiive deficits.    Assessment / Plan / Recommendation Clinical Impression  Pt seen for very limited PO trials with inline PMSV in place on ventilator. Pt to have PEG placement today, so only very minimal sips and chips given to evalaute function. Pt has potential for normal swallow function while on the vent if he is able to tolerate inline PMSV for long periods. Recommend pt continue with plan for long term alternate nutrition given inability to predict consistent respiratory function. SLP to f/u next week for considerate of objective swallow assessment while on the ventilator and using  PMSV.     Aspiration Risk  Moderate    Diet Recommendation NPO;Alternative means - long-term   Medication Administration: Via alternative means    Other  Recommendations Recommended Consults: FEES Oral Care Recommendations: Oral care Q4 per protocol   Follow Up Recommendations  Home health SLP    Frequency and Duration min 3x week  2 weeks   Pertinent Vitals/Pain NA    SLP Swallow Goals     Swallow Study Prior Functional Status       General HPI: 24 y.o. M brought to [facility] ED 2/24 with SOB & hypoxic with sats in 60's. He was placed on BiPAP but repeat ABG 3 hours later showed progressive respiratory decline. Given his hx of cervical fusion, micrognathia, prior trach, etc. (which all predispose him to difficult airway), he was intubated prior to ICU admission. Trach placed on 3/2 by ENT. Per grandmother pt has a history of trach from birth to 53. Prior to admission pt eating and drinking WNL, speech WNL other than cogntiive deficits.  Type of Study: Bedside swallow evaluation Diet Prior to this Study: NPO Respiratory Status: Ventilator Trach Size and Type: #8;Cuff;Deflated;With PMSV in place History of Recent Intubation: Yes Length of Intubations (days): 8 days Date extubated: 04/07/14 Behavior/Cognition: Alert;Cooperative;Requires cueing;Decreased sustained attention;Impulsive Oral Cavity - Dentition: Adequate natural dentition Self-Feeding Abilities: Able to feed self Patient Positioning: Upright in bed Baseline Vocal Quality: Breathy;Low vocal intensity Volitional  Cough: Strong Volitional Swallow: Able to elicit    Oral/Motor/Sensory Function Overall Oral Motor/Sensory Function: Appears within functional limits for tasks assessed   Ice Chips Ice chips: Within functional limits   Thin Liquid Thin Liquid: Within functional limits    Nectar Thick Nectar Thick Liquid: Not tested   Honey Thick Honey Thick Liquid: Not tested   Puree Puree: Not tested   Solid   GO     Solid: Not tested       Claudine MoutoneBlois, Revanth Neidig Caroline 04/16/2014,1:21 PM

## 2014-04-16 NOTE — Progress Notes (Signed)
PULMONARY / CRITICAL CARE MEDICINE   Name: [name] MRN: [id number] DOB: 03/12/1990    ADMISSION DATE:  03/31/2014 CONSULTATION DATE:  04/16/2014  REFERRING MD :  EDP  CHIEF COMPLAINT:  SOB  INITIAL PRESENTATION:  24 y.o. M [facility] with SOB &  hypoxic with sats in 60's.  He was placed on BiPAP but repeat ABG 3 hours later showed progressive respiratory decline.  Given his hx of cervical fusion, micrognathia, prior trach, etc. (which all predispose him to difficult airway), he was intubated prior to ICU admission.  STUDIES:  CTA chest 2/24 >>> negative for PE, limited study due to motion.  SIGNIFICANT EVENTS: 2/24 Admit 2/27 Awake, follows commands on fent / precedex  2/28 Remains on PRVC, agitation / sedation issues.  Weaned briefly on 15/5 3/02 Trach placement [facility]([place]) 3/04 Lots of agitation. Bedside sitter ordered. PT ordered. Tolerated PS 20 cm H2O 3/8 Severe agitation despite frequent sedation PRN push.   SUBJECTIVE:  Awake & interactive Hungry Denies pain Weans on PS 14/5  VITAL SIGNS: Temp:  [96.7 F (35.9 C)-98 F (36.7 C)] 97.1 F (36.2 C) (03/11 0803) Pulse Rate:  [67-92] 82 (03/11 0831) Resp:  [12-22] 12 (03/11 0831) BP: (85-122)/(39-88) 113/88 mmHg (03/11 0831) SpO2:  [97 %-100 %] 99 % (03/11 0803) FiO2 (%):  [40 %] 40 % (03/11 0831) Weight:  [60.1 kg (132 lb 7.9 oz)] 60.1 kg (132 lb 7.9 oz) (03/11 0500)   HEMODYNAMICS:     VENTILATOR SETTINGS: Vent Mode:  [-] CPAP;PSV FiO2 (%):  [40 %] 40 % Set Rate:  [14 bmp] 14 bmp Vt Set:  [360 mL] 360 mL PEEP:  [5 cmH20] 5 cmH20 Pressure Support:  [18 cmH20] 18 cmH20 Plateau Pressure:  [17 cmH20-21 cmH20] 19 cmH20   INTAKE / OUTPUT: Intake/Output      03/10 0701 - 03/11 0700 03/11 0701 - 03/12 0700   I.V. (mL/kg) 600 (10) 150 (2.5)   IV Piggyback 50    Total Intake(mL/kg) 650 (10.8) 150 (2.5)   Urine (mL/kg/hr) 725 (0.5)    Total Output 725     Net -75 +150          PHYSICAL  EXAMINATION: General: young male with trach on vent in NAD Neuro: resolved agitation, moves all ext equal HEENT:  NAD, tracheostomy site intact Cardiovascular: s1 s2 Reg, no M.  Lungs: anterior reduced, barrel chest Abdomen: Soft, NT Ext: R club foot, R hemiatrophy, no edema, no changes   LABS: I have reviewed all of today's lab results. Relevant abnormalities are discussed in the A/P section  ASSESSMENT / PLAN:  PULMONARY OETT 2/24 >> 3/03 Janina Mayorach [facility]([place]) 3/03 >>  A: Acute hypercarbic and hypoxic respiratory failure Restrictive physiology due to severe scoliosis Trach status ARDS -resolved P:   weaning attempts at PS    CARDIOVASCULAR A:  Hypertension Tachy improved daily with free water and correction agitation P:  Clonidine  Telemetry monitoring   RENAL A:   AKI -resolved Hypernatremia (164 on 3/8) - resolved Over diuresis 3/8- resolving P:   Monitor BMET in am  Monitor I/Os Correct electrolytes as indicated Free water  to 250mL q 8 hours -held Continue D5W but reduce to 50 cc/hr  GASTROINTESTINAL A:   Constipation Dysphagia due to trach status Difficult NGT placement P:   SUP: Pantoprazole. PEG to be placed 3/11by IR TFs when enteral route obtained  HEMATOLOGIC A:   DVT prevention Dilutional anemia P:  DVT px: SQ  heparin Monitor CBC intermittently  INFECTIOUS A:   Suspected CAP, treated Poor weaning, fevers, persistent infiltrates P:   Rocephin, start date 2/25>>>2/29 Azithro, start date 2/25>>>2/29 Ceftaz 2/29>>>3/2   Sputum 3/8 >>>NG Cefepime 3/8 >>> Vanc 3/8 >>>3/10  Improved after new ABX Dc vanc Keep cefepime, consider empiric 7 days , as fevers resolved with this  ENDOCRINE A:   No known issues P:   Goal glucose 140-180  NEUROLOGIC A:   Acute metabolic encephalopathy Agitation and uncooperativeness  Hx Insomnia, Tourette's syndrome, tic disorder, scoliosis s/p surgical fixation, right hemiatrophy, right club foot   Apparent extrapyramidal reaction to Haldol. Discussed with pharmacy. Resolved off Haldol. Delirium, resolved P:   RASS goal: 0 Cont clonazepam Cont fent patch Scheduled ativan, can reduce Versed and fentanyl PRN Avoiding dop antagonists Sitter @ bedside  Depakote per pharmacy - has made a huge difference - keep , lft in future   Care management reports that he is not a candidate for LTACH/SSH. Mother is open to idea of taking him home with vent although unclear how that would be possible at this time.  Care during the described time interval was provided by me and/or other providers on the critical care team.  I have reviewed this patient's available data, including medical history, events of note, physical examination and test results as part of my evaluation  CC time x 28m  Antion Andres V. MD  04/16/2014 12:12 PM

## 2014-04-16 NOTE — Procedures (Signed)
Interventional Radiology Procedure Note  Procedure: Placement of percutaneous 20F pull-through gastrostomy tube. Complications: None Recommendations: - NPO except for sips and chips remainder of today and overnight - Maintain G-tube to LWS until tomorrow morning  - May advance diet as tolerated and begin using tube tomorrow morning  Signed,  Coury Grieger K. Melaina Howerton, MD   

## 2014-04-16 NOTE — Evaluation (Signed)
[facility] - Evaluation Patient Details  Name: [name] MRN: [id number] Date of Birth: 10/09/1990  Today's Date: 04/16/2014 Time: 0920-0955 SLP Time Calculation (min) (ACUTE ONLY): 35 min  Past Medical History:  Past Medical History  Diagnosis Date  . Tourette's syndrome   . Tic disorder   . Scoliosis   . Hemiatrophy of right leg   . Club foot     Right   Past Surgical History:  Past Surgical History  Procedure Laterality Date  . Foot surgery Right 2002  . Eye muscle surgery Bilateral 1998 and 2002  . Femoral hernia repair      Done when he was an infant  . Harrington rod surgery  2003    For Scoliosis  . Tracheostomy tube placement N/A 04/07/2014    Procedure: TRACHEOSTOMY;  Surgeon: Flo ShanksKarol [place], MD;  [facility];  Service: ENT;  Laterality: N/A;   HPI:  24 y.o. M [facility] with SOB & hypoxic with sats in 60's. He was placed on BiPAP but repeat ABG 3 hours later showed progressive respiratory decline. Given his hx of cervical fusion, micrognathia, prior trach, etc. (which all predispose him to difficult airway), he was intubated prior to ICU admission. Trach placed on 3/2 by ENT. 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 Prior to admission pt eating and drinking WNL, speech WNL other than cogntiive deficits.    Assessment / Plan / Recommendation Clinical Impression  Patient seen for in-line PMSV evaluation with co treat from RT. Pt initially on CPAP/PS setting, tolerated cuff deflation and achieved upper airway patentcy with adequate drop in expiratory volume. PMSV placed with initial increase in PIP to 30. Pt demosntrated inability to phonate with overt discomfort. RT changed pt to full support, dropped PEEP to 0, gradually increased tidal volume to 780 with PIP reaching 34/35 with gradual increase in volume for speech and subjective comfort from pt. Verbal cues for increased breath support for volume needed at word and phrase  level. Recommend effort with inline PMSV continue next week. Pt shows good potential for prolonged use and use with PO intake, see swallow eval.     SLP Assessment  Patient needs continued Speech Lanaguage Pathology Services    Follow Up Recommendations  Home health SLP    Frequency and Duration        Pertinent Vitals/Pain na    SLP Goals     PMSV Trial  PMSV was placed for: 15 mintues Able to redirect subglottic air through upper airway: Yes Able to Attain Phonation: Yes Voice Quality: Breathy;Low vocal intensity Able to Expectorate Secretions: No attempts Breath Support for Phonation: Moderately decreased Intelligibility: Intelligibility reduced Word: 50-74% accurate Phrase: 50-74% accurate Sentence: 50-74% accurate SpO2 During Trial: 98 %   Tracheostomy Tube       Vent Dependency  Vent Dependent: Yes Vent Mode: CPAP Set Rate: 14 bmp PEEP: 5 cmH20 Pressure Support: 18 cmH20 FiO2 (%): 40 %    Cuff Deflation Trial Tolerated Cuff Deflation: Yes Length of Time for Cuff Deflation Trial: 1 minute  (then 15 minutes) Behavior: Alert;Anxious   Faylene Allerton, Riley NearingBonnie Caroline 04/16/2014, 1:13 PM

## 2014-04-16 NOTE — Sedation Documentation (Signed)
Patient denies pain and is resting comfortably.  

## 2014-04-16 NOTE — Progress Notes (Signed)
**Note De-Identified  Obfuscation** Patient tolerated in-line PMSV; placed back onto CP/PS after trial.  RRT to cont. To monitor

## 2014-04-17 NOTE — Progress Notes (Signed)
Patient ID: [name], male   DOB: 07/09/1990, 24 y.o.   MRN: [id number]    Referring Physician(s): CCM  Subjective:  Pt doing well; no acute changes  Allergies: Review of patient's allergies indicates no known allergies.  Medications: Prior to Admission medications   Medication Sig Start Date End Date Taking? Authorizing Provider  amphetamine-dextroamphetamine (ADDERALL) 10 MG tablet Take 5 mg by mouth daily with breakfast.   Yes Historical Provider, MD  cloNIDine (CATAPRES) 0.1 MG tablet TAKE 3 TABLETS BY MOUTH AT BEDTIME 03/11/14  Yes Princella Ionina P Goodpasture, NP  Dexmethylphenidate HCl 35 MG CP24 Take 1 capsule every morning 03/11/14  Yes Princella Ionina P Goodpasture, NP     Vital Signs: BP 105/59 mmHg  Pulse 74  Temp(Src) 97 F (36.1 C) (Axillary)  Resp 30  Ht 4\' 10"  (1.473 m)  Wt 132 lb 15 oz (60.3 kg)  BMI 27.79 kg/m2  SpO2 100%  Physical Exam G tube intact, binder in place; insertion site ok, mildly tender  Imaging: Ir Gastrostomy Tube Mod Sed  04/16/2014   CLINICAL DATA:  24 year old male with developmental delay, respiratory failure and dysphagia with protein calorie malnutrition. He requires percutaneous gastrostomy tube placement for long-term enteral nutrition.  EXAM: PERC PLACEMENT GASTROSTOMY  Date: 04/16/2014  PROCEDURE: 1. Fluoroscopically guided placement of percutaneous pull-through gastrostomy tube. Interventional Radiologist:  Sterling BigHeath K. McCullough, MD  ANESTHESIA/SEDATION: Moderate (conscious) sedation was used. 4 mg Versed, 125 mcg Fentanyl were administered intravenously. The patient's vital signs were monitored continuously by radiology nursing throughout the procedure.  Sedation Time: 50 minutes  FLUOROSCOPY TIME:  3 minutes  63.7 mGy  CONTRAST:  15mL OMNIPAQUE IOHEXOL 300 MG/ML  SOLN  MEDICATIONS: Cefepime recently administered.  TECHNIQUE: Informed consent was obtained from the patient following explanation of the procedure, risks, benefits and alternatives. The patient  understands, agrees and consents for the procedure. All questions were addressed. A time out was performed.  Maximal barrier sterile technique utilized including caps, mask, sterile gowns, sterile gloves, large sterile drape, hand hygiene, and chlorhexadine skin prep.  An angled catheter was advanced over a wire under fluoroscopic guidance through the nose, down the esophagus and into the body of the stomach. The stomach was then insufflated with several 100 ml of air. Fluoroscopy confirmed location of the gastric bubble, as well as inferior displacement of the barium stained colon. Under direct fluoroscopic guidance, a single T-tack was placed, and the anterior gastric wall drawn up against the anterior abdominal wall. Percutaneous access was then obtained into the mid gastric body with an 18 gauge sheath needle. Aspiration of air, and injection of contrast material under fluoroscopy confirmed needle placement.  An Amplatz wire was advanced in the gastric body and the access needle exchanged for a 9-French vascular sheath. A snare device was advanced through the vascular sheath and an Amplatz wire advanced through the angled catheter. The Amplatz wire was successfully snared and this was pulled up through the esophagus and out the mouth. A 20-French Burnell BlanksKimberly Clark MIC-PEG tube was then connected to the snare and pulled through the mouth, down the esophagus, into the stomach and out to the anterior abdominal wall. Hand injection of contrast material confirmed intragastric location. The T-tack retention suture was then cut. The pull through peg tube was then secured with the external bumper and capped.  The patient will be observed for several hours with the newly placed tube on low wall suction to evaluate for any post procedure complication. The patient tolerated the procedure  well, there is no immediate complication.  IMPRESSION: Successful placement of a 20 French pull through gastrostomy tube.  Signed,  Sterling Big, MD  [facility]  [facility]   Electronically Signed   By: Malachy Moan M.D.   On: 04/16/2014 17:32   Dg Chest Port 1 View  04/16/2014   CLINICAL DATA:  Respiratory failure.  EXAM: PORTABLE CHEST - 1 VIEW  COMPARISON:  04/13/2014.  04/07/2014.  FINDINGS: Tracheostomy tube in good anatomic position. Interim removal of feeding tube. Progressive left base atelectasis. Small left pleural effusion cannot be excluded. Stable cardiomegaly. Stable deformity of the chest wall. Prior thoracolumbar spine fusion .  IMPRESSION: 1. Interim removal of feeding tube. Tracheostomy tube in stable position. 2. Left lower lobe atelectasis.  Small left pleural effusion.   Electronically Signed   By: Maisie Fus  Register   On: 04/16/2014 07:47    Labs:  CBC:  Recent Labs  04/08/14 0231 04/09/14 0416 04/14/14 0414 04/15/14 0536  WBC 7.5 9.8 11.2* 7.1  HGB 13.1 13.7 16.2 13.7  HCT 45.0 45.1 55.7* 46.9  PLT 260 269 300 312    COAGS:  Recent Labs  04/15/14 0536  INR 1.20    BMP:  Recent Labs  04/13/14 1720 04/14/14 0414 04/15/14 0536 04/16/14 0334  NA 162* 154* 147* 144  K 3.4* 4.2 2.7* 5.1  CL 112 110 103 102  CO2 32 34* 36* 30  GLUCOSE 189* 177* 142* 104*  BUN 111* 94* 52* 39*  CALCIUM 9.5 8.8 8.5 8.7  CREATININE 1.99* 1.66* 1.04 0.92  GFRNONAA 46* 57* >90 >90  GFRAA 53* 66* >90 >90    LIVER FUNCTION TESTS:  Recent Labs  04/06/14 0222  BILITOT 0.6  AST 55*  ALT 30  ALKPHOS 48  PROT 6.7  ALBUMIN 3.2*    Assessment and Plan:  S/p perc gastrostomy tube placement 3/10; stable; ok to use tube for feeds prn  Signed: Melvenia Favela,D KEVIN 04/17/2014, 2:40 PM   I spent a total of 15 minutes in face to face in clinical consultation/evaluation, greater than 50% of which was counseling/coordinating care for perc gastrostomy tube placement

## 2014-04-17 NOTE — Progress Notes (Signed)
PULMONARY / CRITICAL CARE MEDICINE   Name: [name] MRN: [id number] DOB: 11/02/1990    ADMISSION DATE:  03/31/2014 CONSULTATION DATE:  04/17/2014  REFERRING MD :  EDP  CHIEF COMPLAINT:  SOB  INITIAL PRESENTATION:  23 yobm [facility] with SOB &  hypoxic with sats in 60's.  He was placed on BiPAP but repeat ABG 3 hours later showed progressive respiratory decline.  Given his hx of cervical fusion, micrognathia, prior trach, etc. (which all predispose him to difficult airway), he was intubated prior to ICU admission.  STUDIES:  CTA chest 2/24 >>> negative for PE, limited study due to motion.  SIGNIFICANT EVENTS: 2/24 Admit 2/27 Awake, follows commands on fent / precedex  2/28 Remains on PRVC, agitation / sedation issues.  Weaned briefly on 15/5 3/02 Trach placement [facility]([place]) 3/04 Lots of agitation. Bedside sitter ordered. PT ordered. Tolerated PS 20 cm H2O 3/8 Severe agitation despite frequent sedation PRN push.   SUBJECTIVE:  Awake & interactive  Denies pain    VITAL SIGNS: Temp:  [97 F (36.1 C)-98.4 F (36.9 C)] 97 F (36.1 C) (03/12 0757) Pulse Rate:  [50-107] 74 (03/12 1244) Resp:  [15-30] 30 (03/12 1244) BP: (105-141)/(59-85) 105/59 mmHg (03/12 0757) SpO2:  [98 %-100 %] 100 % (03/12 1244) FiO2 (%):  [40 %] 40 % (03/12 1244) Weight:  [132 lb 15 oz (60.3 kg)] 132 lb 15 oz (60.3 kg) (03/12 0451)   HEMODYNAMICS:     VENTILATOR SETTINGS: Vent Mode:  [-]  FiO2 (%):  [40 %] 40 %   INTAKE / OUTPUT: Intake/Output      03/11 0701 - 03/12 0700 03/12 0701 - 03/13 0700   I.V. (mL/kg) 1050 (17.4) 50 (0.8)   IV Piggyback 50    Total Intake(mL/kg) 1100 (18.2) 50 (0.8)   Urine (mL/kg/hr) 650 (0.4) 100 (0.3)   Total Output 650 100   Net +450 -50          PHYSICAL EXAMINATION: General: young male with trach  NAD Neuro: resolved agitation, moves all ext equal HEENT:  NAD, tracheostomy site intact Cardiovascular: s1 s2 Reg, no M.  Lungs: anterior reduced,  barrel chest Abdomen: Soft, NT Ext: R club foot, R hemiatrophy, no edema, no changes   LABS: Labs ordered/ reviewed     Recent Labs Lab 04/14/14 0414 04/15/14 0536 04/16/14 0334  NA 154* 147* 144  K 4.2 2.7* 5.1  CL 110 103 102  CO2 34* 36* 30  BUN 94* 52* 39*  CREATININE 1.66* 1.04 0.92  GLUCOSE 177* 142* 104*    Recent Labs Lab 04/14/14 0414 04/15/14 0536  HGB 16.2 13.7  HCT 55.7* 46.9  WBC 11.2* 7.1  PLT 300 312     No results found for: TSH   No results found for: PROBNP   No results found for: ESRSEDRATE, SEDRATE, POCTSEDRATE    ASSESSMENT / PLAN:  PULMONARY OETT 2/24 >> 3/03 Janina Mayorach [facility]([place]) 3/03 >>  A: Acute hypercarbic and hypoxic respiratory failure 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 PEG  placed 3/11by IR TFs when enteral route obtained  HEMATOLOGIC A:   DVT prevention Dilutional anemia P:  DVT px: SQ heparin Monitor CBC intermittently  INFECTIOUS A:   Suspected CAP, treated Poor weaning, fevers, persistent infiltrates P:   Rocephin, start date 2/25>>>2/29 Azithro, start date 2/25>>>2/29 Ceftaz 2/29>>>3/2   Sputum 3/8 >>>NG Cefepime 3/8 >>> Vanc 3/8 >>>3/10  Improved after new ABX  Keep cefepime, consider empiric 7 days , as fevers resolved with this  ENDOCRINE A:   No known issues P:   Goal glucose 140-180  NEUROLOGIC A:   Acute metabolic encephalopathy Agitation and uncooperativeness  Hx  Insomnia, Tourette's syndrome, tic disorder, scoliosis s/p surgical fixation, right hemiatrophy, right club foot  Apparent extrapyramidal reaction to Haldol. Discussed with pharmacy. Resolved off Haldol. Delirium, resolved P:   RASS goal: 0 Cont clonazepam Cont fent patch   Versed and fentanyl PRN Avoiding dop antagonists Sitter @ bedside  Depakote per pharmacy - has made a huge difference - keep , lft in future    Overall doing better > try clamp feeding tube and check residuals       Sandrea Hughs MD  04/17/2014 12:54 PM

## 2014-04-18 ENCOUNTER — Encounter (HOSPITAL_COMMUNITY): Payer: Self-pay | Admitting: *Deleted

## 2014-04-18 NOTE — Progress Notes (Signed)
PULMONARY / CRITICAL CARE MEDICINE   Name: [name] MRN: [id number] DOB: 12/01/1990    ADMISSION DATE:  03/31/2014 CONSULTATION DATE:  04/18/2014  REFERRING MD :  EDP  CHIEF COMPLAINT:  SOB  INITIAL PRESENTATION:  23 [facility] with SOB &  hypoxic with sats in 60's.  He was placed on BiPAP but repeat ABG 3 hours later showed progressive respiratory decline.  Given his hx of cervical fusion, micrognathia, prior trach, etc. (which all predispose him to difficult airway), he was intubated prior to ICU admission.  STUDIES:  CTA chest 2/24 >>> negative for PE, limited study due to motion.  SIGNIFICANT EVENTS: 2/24 Admit 2/27 Awake, follows commands on fent / precedex  2/28 Remains on PRVC, agitation / sedation issues.  Weaned briefly on 15/5 3/02 Trach placement [facility]) 3/04 Lots of agitation. Bedside sitter ordered. PT ordered. Tolerated PS 20 cm H2O 3/8 Severe agitation despite frequent sedation PRN push.  3/11 Gastrostomy per IR   SUBJECTIVE:  Awake & interactive Having some discomfort with tube feeding no vomiting /passing gas     VITAL SIGNS: Temp:  [97.1 F (36.2 C)-98 F (36.7 C)] 97.9 F (36.6 C) (03/13 0751) Pulse Rate:  [60-88] 82 (03/13 1144) Resp:  [16-30] 21 (03/13 0751) BP: (95-117)/(53-90) 106/76 mmHg (03/13 1031) SpO2:  [93 %-100 %] 97 % (03/13 1144) FiO2 (%):  [28 %-40 %] 28 % (03/13 1144) Weight:  [134 lb 11.2 oz (61.1 kg)] 134 lb 11.2 oz (61.1 kg) (03/13 0500)   HEMODYNAMICS:     VENTILATOR SETTINGS: Vent Mode:  [-]  FiO2 (%):  [28 %-40 %] 28 %   INTAKE / OUTPUT: Intake/Output      03/12 0701 - 03/13 0700 03/13 0701 - 03/14 0700   I.V. (mL/kg) 915 (15) 105.8 (1.7)   Other 300 150   NG/GT 500    IV Piggyback 303.8    Total Intake(mL/kg) 2018.8 (33) 255.8 (4.2)   Urine (mL/kg/hr) 400 (0.3)    Total Output 400     Net +1618.8 +255.8        Urine Occurrence 2 x      PHYSICAL EXAMINATION: General: young male with trach   NAD Neuro: resolved agitation, moves all ext equal HEENT:  NAD, tracheostomy site intact Cardiovascular: s1 s2 Reg, no M.  Lungs: anterior reduced, barrel chest Abdomen:mildly distended no sign tenderness/ rebound/ bs diminished  Ext: R club foot, R hemiatrophy, no edema, no changes   LABS: Labs ordered/ reviewed     Recent Labs Lab 04/14/14 0414 04/15/14 0536 04/16/14 0334  NA 154* 147* 144  K 4.2 2.7* 5.1  CL 110 103 102  CO2 34* 36* 30  BUN 94* 52* 39*  CREATININE 1.66* 1.04 0.92  GLUCOSE 177* 142* 104*    Recent Labs Lab 04/14/14 0414 04/15/14 0536  HGB 16.2 13.7  HCT 55.7* 46.9  WBC 11.2* 7.1  PLT 300 312       PULMONARY OETT 2/24 >> 3/03 Janina Mayorach [facility]([place]) 3/03 >>  A: Acute hypercarbic and hypoxic respiratory failure Restrictive physiology due to severe scoliosis Trach status ARDS -resolved P:   Tolerating t collar 24/7    CARDIOVASCULAR A:  Hypertension Tachy resolved  with free water and correction agitation P:  Clonidine  Telemetry monitoring   RENAL A:   AKI -resolved Hypernatremia (164 on 3/8) - resolved Over diuresis 3/8- resolving P:   Monitor BMET in am  Monitor I/Os Correct electrolytes as indicated  Continue D5W  50 cc/hr  GASTROINTESTINAL A:   Constipation Dysphagia due to trach status Difficult NGT placement P:   SUP: Pantoprazole. TFs titrate up to goal as tol  HEMATOLOGIC A:   DVT prevention Dilutional anemia P:  DVT px: SQ heparin Monitor CBC intermittently  INFECTIOUS A:   Suspected CAP, treated Poor weaning, fevers, persistent infiltrates    -Sputum 3/8 >>>NG P:   Rocephin, start date 2/25>>>2/29 Azithro, start date 2/25>>>2/29 Ceftaz 2/29>>>3/2 Vanc 3/8 >>>3/10 Cefepime 3/8 >>>  Improved after new ABX  Keep cefepime, consider empiric 7 days , as fevers resolved with this  ENDOCRINE A:   No known issues P:   Goal glucose 140-180  NEUROLOGIC A:   Acute metabolic  encephalopathy Agitation and uncooperativeness  Hx Insomnia, Tourette's syndrome, tic disorder, scoliosis s/p surgical fixation, right hemiatrophy, right club foot  Apparent extrapyramidal reaction to Haldol. Discussed with pharmacy. Resolved off Haldol. Delirium, resolved P:   Cont clonazepam Cont fent patch   Versed and fentanyl PRN Avoiding dop antagonists Sitter @ bedside  Depakote per pharmacy - has made a huge difference - keep , lft in future       Sandrea Hughs, MD Pulmonary and Critical Care Medicine [facility] Cell [phone] After 5:30 PM or weekends, call [phone]

## 2014-04-19 LAB — GLUCOSE, CAPILLARY
GLUCOSE-CAPILLARY: 115 mg/dL — AB (ref 70–99)
GLUCOSE-CAPILLARY: 120 mg/dL — AB (ref 70–99)
GLUCOSE-CAPILLARY: 104 mg/dL — AB (ref 70–99)
Glucose-Capillary: 120 mg/dL — ABNORMAL HIGH (ref 70–99)
Glucose-Capillary: 104 mg/dL — ABNORMAL HIGH (ref 70–99)
Glucose-Capillary: 103 mg/dL — ABNORMAL HIGH (ref 70–99)
Glucose-Capillary: 104 mg/dL — ABNORMAL HIGH (ref 70–99)
Glucose-Capillary: 120 mg/dL — ABNORMAL HIGH (ref 70–99)
Glucose-Capillary: 96 mg/dL (ref 70–99)
Glucose-Capillary: 110 mg/dL — ABNORMAL HIGH (ref 70–99)

## 2014-04-19 MED ORDER — VALPROIC ACID 250 MG/5ML PO SYRP
250.0000 mg | ORAL_SOLUTION | Freq: Two times a day (BID) | ORAL | Status: DC
  Administered 2014-04-19 – 2014-04-20 (×3): 250 mg
  Filled 2014-04-19 (×4): qty 5

## 2014-04-19 NOTE — Progress Notes (Addendum)
PULMONARY / CRITICAL CARE MEDICINE   Name: [name] MRN: [id number] DOB: 11/29/1990    ADMISSION DATE:  03/31/2014 CONSULTATION DATE:  04/19/2014  REFERRING MD :  EDP  CHIEF COMPLAINT:  SOB  INITIAL PRESENTATION:  23 yobm brought to [facility] ED 2/24 with SOB &  hypoxic with sats in 60's.  He was placed on BiPAP but repeat ABG 3 hours later showed progressive respiratory decline.  Given his hx of cervical fusion, micrognathia, prior trach, etc. (which all predispose him to difficult airway), he was intubated prior to ICU admission.  STUDIES:  CTA chest 2/24 >>> negative for PE, limited study due to motion.  SIGNIFICANT EVENTS: 2/24 Admit 2/27 Awake, follows commands on fent / precedex  2/28 Remains on PRVC, agitation / sedation issues.  Weaned briefly on 15/5 3/02 Trach placement [facility]) 3/04 Lots of agitation. Bedside sitter ordered. PT ordered. Tolerated PS 20 cm H2O 3/8 Severe agitation despite frequent sedation PRN push.  3/11 Gastrostomy per IR   SUBJECTIVE:  Awake & interactive Tolerating TC well.   VITAL SIGNS: Temp:  [97.3 F (36.3 C)-98.2 F (36.8 C)] 97.3 F (36.3 C) (03/14 0757) Pulse Rate:  [67-93] 67 (03/14 0809) Resp:  [16-29] 17 (03/14 0809) BP: (102-121)/(55-80) 115/55 mmHg (03/14 0809) SpO2:  [95 %-100 %] 100 % (03/14 0809) FiO2 (%):  [28 %] 28 % (03/14 0808) Weight:  [60.1 kg (132 lb 7.9 oz)] 60.1 kg (132 lb 7.9 oz) (03/14 0500)   HEMODYNAMICS:     VENTILATOR SETTINGS: Vent Mode:  [-]  FiO2 (%):  [28 %] 28 %   INTAKE / OUTPUT: Intake/Output      03/13 0701 - 03/14 0700 03/14 0701 - 03/15 0700   I.V. (mL/kg) 900 (15)    Other 580    NG/GT 500    IV Piggyback 53    Total Intake(mL/kg) 2033 (33.8)    Urine (mL/kg/hr) 800 (0.6)    Stool 0 (0)    Total Output 800     Net +1233          Urine Occurrence 1 x    Stool Occurrence 5 x     PHYSICAL EXAMINATION: General: young male with trach  NAD Neuro: resolved agitation, moves all ext  equal HEENT:  NAD, tracheostomy site intact Cardiovascular: s1 s2 Reg, no M.  Lungs: anterior reduced, barrel chest Abdomen:mildly distended no sign tenderness/ rebound/ bs diminished  Ext: R club foot, R hemiatrophy, no edema, no changes  LABS: Labs ordered/ reviewed   Recent Labs Lab 04/14/14 0414 04/15/14 0536 04/16/14 0334  NA 154* 147* 144  K 4.2 2.7* 5.1  CL 110 103 102  CO2 34* 36* 30  BUN 94* 52* 39*  CREATININE 1.66* 1.04 0.92  GLUCOSE 177* 142* 104*   Recent Labs Lab 04/14/14 0414 04/15/14 0536  HGB 16.2 13.7  HCT 55.7* 46.9  WBC 11.2* 7.1  PLT 300 312   PULMONARY OETT 2/24 >> 3/03 [facility]) 3/03 >>  A: Acute hypercarbic and hypoxic respiratory failure Restrictive physiology due to severe scoliosis Trach status ARDS -resolved P:   Tolerating trach collar 24/7  Titrate O2 for sats. Tolerating 28% TC. May require a trach change prior to discharge.  CARDIOVASCULAR A:  Hypertension Tachy resolved  with free water and correction agitation P:  Clonidine  Telemetry monitoring  RENAL A:   AKI -resolved Hypernatremia (164 on 3/8) - resolved Over diuresis 3/8- resolving P:   Monitor BMET in am  Monitor  I/Os Correct electrolytes as indicated KVO IVF  GASTROINTESTINAL A:   Constipation Dysphagia due to trach status Difficult NGT placement P:   SUP: Pantoprazole. TFs titrate up to goal as tol, will increase to goal. Swallow evaluation and PMV evaluation.  HEMATOLOGIC A:   DVT prevention Dilutional anemia P:  DVT px: SQ heparin Monitor CBC intermittently  INFECTIOUS A:   Suspected CAP, treated Poor weaning, fevers, persistent infiltrates    -Sputum 3/8 >>>NG P:   Rocephin, start date 2/25>>>2/29 Azithro, start date 2/25>>>2/29 Ceftaz 2/29>>>3/2 Vanc 3/8 >>>3/10 Cefepime 3/8 >>> 3/15  Improved after new ABX. D/C cefepime after 3/15 dose.  ENDOCRINE A:   No known issues P:   Goal glucose 140-180  NEUROLOGIC A:    Acute metabolic encephalopathy Agitation and uncooperativeness  Hx Insomnia, Tourette's syndrome, tic disorder, scoliosis s/p surgical fixation, right hemiatrophy, right club foot  Apparent extrapyramidal reaction to Haldol. Discussed with pharmacy. Resolved off Haldol. Delirium, resolved P:   Cont clonazepam Cont fent patch   Versed and fentanyl PRN Avoiding dop antagonists Sitter @ bedside  Depakote per pharmacy - has made a huge difference - keep , lft in future change to PO   Alyson ReedyWesam G. Yacoub, M.D. [facility] Pulmonary/Critical Care Medicine. Pager: [phone]. After hours pager: [phone]

## 2014-04-19 NOTE — Progress Notes (Signed)
Speech Language Pathology Treatment:    Patient Details Name: [name] MRN: [id number] DOB: 11/08/1990 Today's Date: 04/19/2014 Time: 1310-1340 SLP Time Calculation (min) (ACUTE ONLY): 30 min  Assessment / Plan / Recommendation Clinical Impression  Pt tolerated PMV intermittently for periods of 5-8 minutes with no evidence of air trapping. Pt did not demonstrate suficient upper airway patency for phonation. Pt unable to phonate, cough or blow air though his mouth during this session despite max cues from SLP. RR fluctuated between 16-28; O2-99. Recommend pt's trach be downsized to improve upper airway patency. Speech will follow-up with PMV treatment.    HPI HPI: 24 y.o. M [facility] with SOB & hypoxic with sats in 60's. He was placed on BiPAP but repeat ABG 3 hours later showed progressive respiratory decline. Given his hx of cervical fusion, micrognathia, prior trach, etc. (which all predispose him to difficult airway), he was intubated prior to ICU admission. Trach placed on 3/2 by ENT. 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 Prior to admission pt eating and drinking WNL, speech WNL other than cogntiive deficits.    Pertinent Vitals Pain Assessment: Faces Faces Pain Scale: Hurts a little bit Pain Location: at abdomen where feeding tube inserted Pain Descriptors / Indicators: Discomfort;Grimacing Pain Intervention(s): Monitored during session;Repositioned  SLP Plan       Recommendations        Patient may use Passy-Muir Speech Valve: with SLP only PMSV Supervision: Full MD: Please consider changing trach tube to : Smaller size       Follow up Recommendations: Home health SLP    GO     [name], [name] 04/19/2014, 2:36 PM

## 2014-04-19 NOTE — Procedures (Signed)
Objective Swallowing Evaluation: Fiberoptic Endoscopic Evaluation of Swallowing  Patient Details  Name: [name] MRN: [id number] Date of Birth: 02-19-1990  Today's Date: 04/19/2014 Time: SLP Start Time (ACUTE ONLY): 1320-SLP Stop Time (ACUTE ONLY): 1340 SLP Time Calculation (min) (ACUTE ONLY): 20 min  Past Medical History:  Past Medical History  Diagnosis Date  . Tourette's syndrome   . Tic disorder   . Scoliosis   . Hemiatrophy of right leg   . Club foot     Right   Past Surgical History:  Past Surgical History  Procedure Laterality Date  . Foot surgery Right 2002  . Eye muscle surgery Bilateral 1998 and 2002  . Femoral hernia repair      Done when he was an infant  . Harrington rod surgery  2003    For Scoliosis  . Tracheostomy tube placement N/A 04/07/2014    Procedure: TRACHEOSTOMY;  Surgeon: Flo Shanks, MD;  Location: [facility] OR;  Service: ENT;  Laterality: N/A;   HPI:  HPI: 24 y.o. M brought to [facility] ED 2/24 with SOB & hypoxic with sats in 60's. He was placed on BiPAP but repeat ABG 3 hours later showed progressive respiratory decline. Given his hx of cervical fusion, micrognathia, prior trach, etc. (which all predispose him to difficult airway), he was intubated prior to ICU admission. Trach placed on 3/2 by ENT. Per grandmother pt has a history of trach from birth to 13. Prior to admission pt eating and drinking WNL, speech WNL other than cogntiive deficits.   No Data Recorded  Assessment / Plan / Recommendation CHL IP CLINICAL IMPRESSIONS 04/19/2014  Dysphagia Diagnosis Moderate oral phase dysphagia;Moderate pharyngeal phase dysphagia  Clinical impression Pt demonstrates moderate oral phase dysphagia with lingual pumping and piecemeal transit/spillage of liquid POs to the oropharynx. Oral transit improved with teaspoon quantity. Oropharyngeal phase impaired with delayed swallow with liquids silently penetrated/aspirated before the swallow, and pharyngeal weakness resulting  in residuals and penetration post swallow. Question instance of cervical esophageal backflow following purees. Best method appeared to be limiting pt to small teaspoon bites of puree and nectar thick liquids. Function also complicated by poor tolerance of PMSV this session with minimal redirection of air to upper airway, impacting laryngeal sensation and ability to cough. Recommend pt remain NPO, SLP will follow for trials and therapy. Downsize of trach to cuffless or 4 would be beneficial.      CHL IP TREATMENT RECOMMENDATION 04/19/2014  Treatment Plan Recommendations Therapy as outlined in treatment plan below     CHL IP DIET RECOMMENDATION 04/19/2014  Diet Recommendations NPO;Alternative means - long-term  Liquid Administration via (None)  Medication Administration Via alternative means  Compensations (None)  Postural Changes and/or Swallow Maneuvers (None)     CHL IP OTHER RECOMMENDATIONS 04/19/2014  Recommended Consults (None)  Oral Care Recommendations Oral care Q4 per protocol  Other Recommendations (None)     CHL IP FOLLOW UP RECOMMENDATIONS 04/19/2014  Follow up Recommendations Home health SLP     CHL IP FREQUENCY AND DURATION 04/19/2014  Speech Therapy Frequency (ACUTE ONLY) min 2x/week  Treatment Duration 2 weeks     Pertinent Vitals/Pain NA    SLP Swallow Goals No flowsheet data found.  No flowsheet data found.    CHL IP REASON FOR REFERRAL 04/19/2014  Reason for Referral Objectively evaluate swallowing function     CHL IP ORAL PHASE 04/19/2014  Lips (None)  Tongue (None)  Mucous membranes (None)  Nutritional status (None)  Other (None)  Oxygen  therapy (None)  Oral Phase Impaired  Oral - Pudding Teaspoon (None)  Oral - Pudding Cup (None)  Oral - Honey Teaspoon Lingual pumping;Weak lingual manipulation;Incomplete tongue to palate contact;Reduced posterior propulsion;Lingual/palatal residue;Piecemeal swallowing;Delayed oral transit  Oral - Honey Cup (None)  Oral -  Honey Syringe (None)  Oral - Nectar Teaspoon Lingual pumping;Weak lingual manipulation;Incomplete tongue to palate contact;Reduced posterior propulsion;Lingual/palatal residue;Piecemeal swallowing;Delayed oral transit  Oral - Nectar Cup Lingual pumping;Weak lingual manipulation;Incomplete tongue to palate contact;Reduced posterior propulsion;Lingual/palatal residue;Piecemeal swallowing;Delayed oral transit  Oral - Nectar Straw (None)  Oral - Nectar Syringe (None)  Oral - Ice Chips (None)  Oral - Thin Teaspoon Lingual pumping;Weak lingual manipulation;Incomplete tongue to palate contact;Reduced posterior propulsion;Lingual/palatal residue;Piecemeal swallowing;Delayed oral transit  Oral - Thin Cup Lingual pumping;Weak lingual manipulation;Incomplete tongue to palate contact;Reduced posterior propulsion;Lingual/palatal residue;Piecemeal swallowing;Delayed oral transit  Oral - Thin Straw (None)  Oral - Thin Syringe (None)  Oral - Puree Lingual pumping;Weak lingual manipulation;Incomplete tongue to palate contact;Reduced posterior propulsion;Lingual/palatal residue;Piecemeal swallowing;Delayed oral transit  Oral - Mechanical Soft (None)  Oral - Regular (None)  Oral - Multi-consistency (None)  Oral - Pill (None)  Oral Phase - Comment (None)      CHL IP PHARYNGEAL PHASE 04/19/2014  Pharyngeal Phase Impaired  Pharyngeal - Pudding Teaspoon (None)  Penetration/Aspiration details (pudding teaspoon) (None)  Pharyngeal - Pudding Cup (None)  Penetration/Aspiration details (pudding cup) (None)  Pharyngeal - Honey Teaspoon Delayed swallow initiation;Premature spillage to pyriform sinuses;Reduced pharyngeal peristalsis;Reduced laryngeal elevation;Penetration/Aspiration before swallow;Penetration/Aspiration after swallow;Pharyngeal residue - posterior pharnyx;Inter-arytenoid space residue;Lateral channel residue  Penetration/Aspiration details (honey teaspoon) Material enters airway, CONTACTS cords and not  ejected out  Pharyngeal - Honey Cup (None)  Penetration/Aspiration details (honey cup) (None)  Pharyngeal - Honey Syringe (None)  Penetration/Aspiration details (honey syringe) (None)  Pharyngeal - Nectar Teaspoon Delayed swallow initiation;Premature spillage to pyriform sinuses;Reduced pharyngeal peristalsis;Reduced laryngeal elevation;Pharyngeal residue - posterior pharnyx  Penetration/Aspiration details (nectar teaspoon) Material does not enter airway  Pharyngeal - Nectar Cup Delayed swallow initiation;Premature spillage to pyriform sinuses;Reduced pharyngeal peristalsis;Reduced laryngeal elevation;Penetration/Aspiration before swallow;Penetration/Aspiration after swallow;Pharyngeal residue - posterior pharnyx;Inter-arytenoid space residue;Lateral channel residue  Penetration/Aspiration details (nectar cup) Material enters airway, passes BELOW cords without attempt by patient to eject out (silent aspiration);Material enters airway, CONTACTS cords and not ejected out  Pharyngeal - Nectar Straw (None)  Penetration/Aspiration details (nectar straw) (None)  Pharyngeal - Nectar Syringe (None)  Penetration/Aspiration details (nectar syringe) (None)  Pharyngeal - Ice Chips (None)  Penetration/Aspiration details (ice chips) (None)  Pharyngeal - Thin Teaspoon Delayed swallow initiation;Premature spillage to pyriform sinuses;Reduced pharyngeal peristalsis;Reduced laryngeal elevation;Penetration/Aspiration before swallow;Penetration/Aspiration after swallow;Pharyngeal residue - posterior pharnyx;Inter-arytenoid space residue;Lateral channel residue  Penetration/Aspiration details (thin teaspoon) Material enters airway, passes BELOW cords without attempt by patient to eject out (silent aspiration)  Pharyngeal - Thin Cup (None)  Penetration/Aspiration details (thin cup) (None)  Pharyngeal - Thin Straw (None)  Penetration/Aspiration details (thin straw) (None)  Pharyngeal - Thin Syringe (None)   Penetration/Aspiration details (thin syringe') (None)  Pharyngeal - Puree Delayed swallow initiation;Premature spillage to pyriform sinuses;Reduced pharyngeal peristalsis;Reduced laryngeal elevation;Penetration/Aspiration after swallow;Pharyngeal residue - posterior pharnyx;Inter-arytenoid space residue;Lateral channel residue  Penetration/Aspiration details (puree) Material enters airway, CONTACTS cords and not ejected out;Material enters airway, remains ABOVE vocal cords and not ejected out  Pharyngeal - Mechanical Soft (None)  Penetration/Aspiration details (mechanical soft) (None)  Pharyngeal - Regular (None)  Penetration/Aspiration details (regular) (None)  Pharyngeal - Multi-consistency (None)  Penetration/Aspiration details (multi-consistency) (None)  Pharyngeal - Pill (None)  Penetration/Aspiration  details (pill) (None)  Pharyngeal Comment (None)     No flowsheet data found.  No flowsheet data found.        Harlon DittyBonnie Aldena Worm, MA CCC-SLP [phone]  Claudine MoutonDeBlois, Daunte Oestreich Caroline 04/19/2014, 2:50 PM

## 2014-04-19 NOTE — Progress Notes (Signed)
Physical Therapy Treatment Patient Details Name: [name] MRN: [id number] DOB: [birth year] Today's Date: 04/19/2014    History of Present Illness Patient is a 24 yo male admitted 03/31/14 with respiratory failure, hypoxia (in 60's in ED) and was intubated.   Had trach on 04/07/14.  Recent agitation/confusion.  Patient with waist restraint and mitts.  PMH:  Scoliosis, HTN, Tourette's syndrome, Rt hemiatrophy RLE, Rt club foot, cervical fusion.    PT Comments    Patient demonstrates improved activity tolerance. Patient was able to ambulate in hall on ATC at 28% fio2 with saturations remaining >95%.  Will continue to see and progress as tolerated.  Follow Up Recommendations  Supervision/Assistance - 24 hour     Equipment Recommendations  Wheelchair (measurements PT);Rolling walker with 5" wheels    Recommendations for Other Services       Precautions / Restrictions Precautions Precautions: Fall Precaution Comments: Trach, on ATC Restrictions Weight Bearing Restrictions: No    Mobility  Bed Mobility Overal bed mobility: Needs Assistance Bed Mobility: Supine to Sit     Supine to sit: Supervision     General bed mobility comments: Impulsive, assist for line management and cues to direct attention to lines for safety  Transfers Overall transfer level: Needs assistance Equipment used: Rolling walker (2 wheeled) Transfers: Sit to/from [employer] Sit to Stand: Min guard Stand pivot transfers: Min guard       General transfer comment: min guard to chair, min guard to standing   Ambulation/Gait Ambulation/Gait assistance: Min guard Ambulation Distance (Feet): 140 Feet Assistive device: Rolling walker (2 wheeled) Gait Pattern/deviations: Step-through pattern;Decreased stride length;Drifts right/left;Trunk flexed Gait velocity: decreased Gait velocity interpretation: Below normal speed for age/gender General Gait Details: VCs for positioning and safety with  use of RW, occassional difficulty controlling ambulation with RW.    Stairs            Wheelchair Mobility    Modified Rankin (Stroke Patients Only)       Balance   Sitting-balance support: No upper extremity supported Sitting balance-Leahy Scale: Fair     Standing balance support: Bilateral upper extremity supported Standing balance-Leahy Scale: Fair                      Cognition Arousal/Alertness: Awake/alert Behavior During Therapy: Impulsive Overall Cognitive Status: Difficult to assess                      Exercises      General Comments        Pertinent Vitals/Pain Pain Assessment: Faces Faces Pain Scale: Hurts a little bit Pain Location: at abdomen where feeding tube inserted Pain Descriptors / Indicators: Discomfort;Grimacing Pain Intervention(s): Monitored during session;Repositioned    Home Living                      Prior Function            PT Goals (current goals can now be found in the care plan section) Acute Rehab PT Goals Patient Stated Goal: None stated PT Goal Formulation: With patient Time For Goal Achievement: 04/25/14 Potential to Achieve Goals: Good Progress towards PT goals: Progressing toward goals    Frequency  Min 3X/week    PT Plan Current plan remains appropriate    Co-evaluation             End of Session Equipment Utilized During Treatment: Gait belt;Oxygen (ATC) Activity Tolerance: Patient tolerated treatment  well Patient left: in bed;with call bell/phone within reach;with bed alarm set     Time: [id number] PT Time Calculation (min) (ACUTE ONLY): 32 min  Charges:  $Gait Training: 8-22 mins $Therapeutic Activity: 8-22 mins                    G CodesFabio [name]:      [name] 04/19/2014, 11:33 AM [name], PT DPT  [phone]

## 2014-04-19 NOTE — Progress Notes (Addendum)
MEDICATION RELATED CONSULT NOTE - Follow-up  Pharmacy Consult for Depakote Indication: acute metabolic encephalopathy with history of Tourette's, tic disorder  No Known Allergies  Patient Measurements: Height:  (147.3 cm) Weight: 132 lb 7.9 oz (60.1 kg) IBW/kg (Calculated) : 45.4   Vital Signs: Temp: 97.1 F (36.2 C) (03/14 1134) Temp Source: Oral (03/14 1134) BP: 106/51 mmHg (03/14 1134) Pulse Rate: 101 (03/14 1134) Intake/Output from previous day: 03/13 0701 - 03/14 0700 In: 2033 [I.V.:900; NG/GT:500; IV Piggyback:53] Out: 800 [Urine:800] Intake/Output from this shift:    Labs: No results for input(s): WBC, HGB, HCT, PLT, APTT, CREATININE, LABCREA, CREATININE, CREAT24HRUR, MG, PHOS, ALBUMIN, PROT, ALBUMIN, AST, ALT, ALKPHOS, BILITOT, BILIDIR, IBILI in the last 72 hours. Estimated Creatinine Clearance: 90.6 mL/min (by C-G formula based on Cr of 0.92).   Microbiology: Recent Results (from the past 720 hour(s))  Blood culture (routine x 2)     Status: None   Collection Time: 03/31/14  7:50 PM  Result Value Ref Range Status   Specimen Description BLOOD RIGHT HAND  Final   Special Requests BOTTLES DRAWN AEROBIC ONLY 10CC  Final   Culture   Final    NO GROWTH 5 DAYS Performed at [facility]    Report Status 04/07/2014 FINAL  Final  Blood culture (routine x 2)     Status: None   Collection Time: 03/31/14  8:00 PM  Result Value Ref Range Status   Specimen Description BLOOD LEFT ARM  Final   Special Requests BOTTLES DRAWN AEROBIC ONLY 10CC  Final   Culture   Final    NO GROWTH 5 DAYS Performed at [facility]    Report Status 04/07/2014 FINAL  Final  MRSA PCR Screening     Status: None   Collection Time: 03/31/14 11:47 PM  Result Value Ref Range Status   MRSA by PCR NEGATIVE NEGATIVE Final    Comment:        The GeneXpert MRSA Assay (FDA approved for NASAL specimens only), is one component of a comprehensive MRSA colonization surveillance  program. It is not intended to diagnose MRSA infection nor to guide or monitor treatment for MRSA infections.   Culture, respiratory (NON-Expectorated)     Status: None   Collection Time: 04/01/14  2:33 AM  Result Value Ref Range Status   Specimen Description TRACHEAL ASPIRATE  Final   Special Requests NONE  Final   Gram Stain   Final    FEW WBC PRESENT, PREDOMINANTLY PMN FEW SQUAMOUS EPITHELIAL CELLS PRESENT FEW GRAM POSITIVE COCCI IN PAIRS FEW GRAM POSITIVE RODS Performed at [facility]    Culture   Final    Non-Pathogenic Oropharyngeal-type Flora Isolated. Performed at [facility]    Report Status 04/03/2014 FINAL  Final  Culture, respiratory (NON-Expectorated)     Status: None   Collection Time: 04/05/14 12:55 PM  Result Value Ref Range Status   Specimen Description SPUTUM  Final   Special Requests ET TUBE  Final   Gram Stain   Final    ABUNDANT WBC PRESENT,BOTH PMN AND MONONUCLEAR NO SQUAMOUS EPITHELIAL CELLS SEEN NO ORGANISMS SEEN Performed at [facility]    Culture   Final    NO GROWTH 2 DAYS Performed at [facility]    Report Status 04/07/2014 FINAL  Final  Culture, respiratory (NON-Expectorated)     Status: None   Collection Time: 04/14/14  5:12 AM  Result Value Ref Range Status   Specimen Description  SPUTUM  Final   Special Requests SUCTIONED  Final   Gram Stain   Final    RARE WBC PRESENT,BOTH PMN AND MONONUCLEAR NO SQUAMOUS EPITHELIAL CELLS SEEN RARE GRAM POSITIVE COCCI IN PAIRS [facility]    Culture   Final    NORMAL OROPHARYNGEAL FLORA [facility]formed at [facility]    Report Status 04/16/2014 FINAL  Final   Assessment: [name] continues on Depakote for delirium. The delirium has resolved. MD decreased depakote to 250mg  per tube BID today. Noted plan to check LFTs in the future.   His LFTs from 3/1 were mostly normal, AST was slightly elevated.  Goal of Therapy:  Trough  concentration 50-14725mcg/mL for mania dosing   Plan:  Continue depakote 250mg  per tube BID (dose decreased today by MD). Plan for level in ~5-7 days if pt continues on Depakote; will also check LFTs at that time  Christoper Fabianaron Sadeen Wiegel, PharmD, BCPS Clinical pharmacist, pager [phone] 04/19/2014 1:38 PM   Addendum:  Noted MD plan to taper Depakote to off.  Plan: Pharmacy will sign off. Depakote taper per MD.  Christoper Fabianaron Xitlally Mooneyham, PharmD, BCPS Clinical pharmacist, pager [phone] 04/20/2014 2:10 PM

## 2014-04-20 LAB — CBC
HEMATOCRIT: 44.2 % (ref 39.0–52.0)
Hemoglobin: 13.2 g/dL (ref 13.0–17.0)
MCH: 28.5 pg (ref 26.0–34.0)
MCHC: 29.9 g/dL — ABNORMAL LOW (ref 30.0–36.0)
MCV: 95.5 fL (ref 78.0–100.0)
Platelets: 164 10*3/uL (ref 150–400)
RBC: 4.63 MIL/uL (ref 4.22–5.81)
RDW: 12.5 % (ref 11.5–15.5)
WBC: 5 10*3/uL (ref 4.0–10.5)

## 2014-04-20 LAB — BASIC METABOLIC PANEL
Anion gap: 11 (ref 5–15)
BUN: 16 mg/dL (ref 6–23)
CO2: 35 mmol/L — ABNORMAL HIGH (ref 19–32)
Calcium: 8.7 mg/dL (ref 8.4–10.5)
Chloride: 96 mmol/L (ref 96–112)
Creatinine, Ser: 0.79 mg/dL (ref 0.50–1.35)
GFR calc Af Amer: >90 mL/min (ref >90)
GLUCOSE: 113 mg/dL — AB (ref 70–99)
POTASSIUM: 5.8 mmol/L — AB (ref 3.5–5.1)
Sodium: 142 mmol/L (ref 135–145)

## 2014-04-20 LAB — MAGNESIUM: Magnesium: 2.4 mg/dL (ref 1.5–2.5)

## 2014-04-20 LAB — GLUCOSE, CAPILLARY
GLUCOSE-CAPILLARY: 116 mg/dL — AB (ref 70–99)
GLUCOSE-CAPILLARY: 124 mg/dL — AB (ref 70–99)
Glucose-Capillary: 102 mg/dL — ABNORMAL HIGH (ref 70–99)
Glucose-Capillary: 181 mg/dL — ABNORMAL HIGH (ref 70–99)
Glucose-Capillary: 93 mg/dL (ref 70–99)
Glucose-Capillary: 104 mg/dL — ABNORMAL HIGH (ref 70–99)

## 2014-04-20 LAB — PHOSPHORUS: Phosphorus: 3.4 mg/dL (ref 2.3–4.6)

## 2014-04-20 MED ORDER — CLONIDINE HCL 0.1 MG PO TABS
0.1000 mg | ORAL_TABLET | Freq: Every day | ORAL | Status: DC
  Administered 2014-04-21 – 2014-04-27 (×7): 0.1 mg
  Filled 2014-04-20 (×8): qty 1

## 2014-04-20 MED ORDER — CLONAZEPAM 0.5 MG PO TABS
0.5000 mg | ORAL_TABLET | Freq: Two times a day (BID) | ORAL | Status: DC
  Administered 2014-04-20 – 2014-04-21 (×2): 0.5 mg
  Filled 2014-04-20 (×2): qty 1

## 2014-04-20 MED ORDER — VALPROIC ACID 250 MG/5ML PO SYRP
125.0000 mg | ORAL_SOLUTION | Freq: Two times a day (BID) | ORAL | Status: DC
  Administered 2014-04-20 – 2014-04-21 (×2): 125 mg
  Filled 2014-04-20 (×3): qty 2.5

## 2014-04-20 NOTE — Procedures (Signed)
First Trach Change  Size 6 cuffed in, sutures removed, size 4 cuffless trach placed without difficulty, good color change and good air movement.  Alyson ReedyWesam G. Jaelee Laughter, M.D. [facility] Pulmonary/Critical Care Medicine. Pager: [phone]. After hours pager: [phone].

## 2014-04-20 NOTE — Progress Notes (Signed)
Physical Therapy Treatment Patient Details Name: Dartanyan [name] MRN: [id number] DOB: 12/13/1990 Today's Date: 04/20/2014    History of Present Illness Patient is a 24 yo male admitted 03/31/14 with respiratory failure, hypoxia (in 60's in ED) and was intubated.   Had trach on 04/07/14.  Recent agitation/confusion.  Patient with waist restraint and mitts.  PMH:  Scoliosis, HTN, Tourette's syndrome, Rt hemiatrophy RLE, Rt club foot, cervical fusion.    PT Comments    Patient tolerated ambulation well on 28% Fio2, patient with improvements in stability this session and less impulsivity. Will continue to see and progress as tolerated.  Follow Up Recommendations  Supervision/Assistance - 24 hour     Equipment Recommendations  Wheelchair (measurements PT);Rolling walker with 5" wheels    Recommendations for Other Services       Precautions / Restrictions Precautions Precautions: Fall Precaution Comments: Trach, on ATC Restrictions Weight Bearing Restrictions: No    Mobility  Bed Mobility Overal bed mobility: Needs Assistance Bed Mobility: Supine to Sit     Supine to sit: Supervision     General bed mobility comments: improved impuslivity today  Transfers Overall transfer level: Needs assistance Equipment used: Rolling walker (2 wheeled) Transfers: Sit to/from [employer] CorporationStand;Stand Pivot Transfers Sit to Stand: Min guard Stand pivot transfers: Min guard       General transfer comment: min guard to chair, min guard to standing   Ambulation/Gait Ambulation/Gait assistance: Min guard;Min assist Ambulation Distance (Feet): 140 Feet (addtl 100 min assist with 2 person Hand held) Assistive device: Rolling walker (2 wheeled);2 person hand held assist Gait Pattern/deviations: Step-through pattern;Decreased stride length;Drifts right/left;Trunk flexed Gait velocity: decreased Gait velocity interpretation: Below normal speed for age/gender General Gait Details: Continued cues for use of RW,  improved with HHA   Stairs            Wheelchair Mobility    Modified Rankin (Stroke Patients Only)       Balance     Sitting balance-Leahy Scale: Fair     Standing balance support: Bilateral upper extremity supported Standing balance-Leahy Scale: Fair                      Cognition Arousal/Alertness: Awake/alert Behavior During Therapy: WFL for tasks assessed/performed Overall Cognitive Status: Within Functional Limits for tasks assessed                      Exercises      General Comments        Pertinent Vitals/Pain Pain Assessment: No/denies pain    Home Living                      Prior Function            PT Goals (current goals can now be found in the care plan section) Acute Rehab PT Goals Patient Stated Goal: None stated PT Goal Formulation: With patient Time For Goal Achievement: 04/25/14 Potential to Achieve Goals: Good Progress towards PT goals: Progressing toward goals    Frequency  Min 3X/week    PT Plan Current plan remains appropriate    Co-evaluation             End of Session Equipment Utilized During Treatment: Gait belt;Oxygen (ATC) Activity Tolerance: Patient tolerated treatment well Patient left: in bed;with call bell/phone within reach;with bed alarm set     Time: [id number] PT Time Calculation (min) (ACUTE ONLY): 25 min  Charges:  $Gait Training:  8-22 mins $Therapeutic Activity: 8-22 mins                    G CodesFabio Asa 04-29-14, 12:43 PM Charlotte Crumb, PT DPT  [phone]

## 2014-04-20 NOTE — Progress Notes (Signed)
Assessed patient for 02 dependency with case manager in the room.  Placed patient on humidified room air and the result was an 02 pulse ox saturation of 86.  I terminated the test at that time and placed him back on 28% humidified 02 and his saturation rose to 96%.  HR 75, RR 22.  Case manager is aware.

## 2014-04-20 NOTE — Progress Notes (Signed)
Pt just left floor with nurse assistant. Pt in no distress on departure.

## 2014-04-20 NOTE — Progress Notes (Signed)
Speech Language Pathology Treatment: Dysphagia;Cognitive-Linquistic;Passy Corky SingMuir Speaking valve  Patient Details Name: [name] MRN: [id number] DOB: 12/27/1990 Today's Date: 04/20/2014 Time: 1120-1200 SLP Time Calculation (min) (ACUTE ONLY): 40 min  Assessment / Plan / Recommendation Clinical Impression  Pt was seen for skilled ST targeting goals for dysphagia, speaking valve toleration, and speech intelligibility.  Upon arrival, pt was seated upright in bed, awake, alert, and pleasantly interactive.  Prior to ST treatment session pt's trach had been downgraded from a Shiley 6 cuffed to a Shiley 4 cuffless.  Pt tolerated valve placement for ~40 minutes with no significant changes in vital signs (HR 102, O2 97, RR 27) and no evidence of breath trapping upon removal.   Pt was able to achieve voicing at the word/phrase level with mod cues for increased vocal intensity, taking a deep breath before talking and breaking short phrases into chunks of 1-2 words at a time.  Upon hearing his voice, pt initiated request to call his mom and utilized the abovementioned strategies with supervision-min assist to improve speech intelligibility to ~50% in phrases.  SLP also facilitated the session with trials of nectar thick liquids and purees to continue working towards initiation of PO diet.  Pt exhibited no overt s/s of aspiration with teaspoons of nectar thick liquids; however, he was noted with intermittently wet vocal quality and delayed cough following large consecutive boluses of purees.  With SLP intervention for slow rate and small bites/sips, pt exhibited no overt s/s of aspiration with pureed textures.  Pt may benefit from a repeat MBS given improvements noted at bedside in comparison to previous notes.  Primary SLP made aware.       HPI HPI: 24 y.o. M [facility] with SOB & hypoxic with sats in 60's. He was placed on BiPAP but repeat ABG 3 hours later showed progressive respiratory decline. Given  his hx of cervical fusion, micrognathia, prior trach, etc. (which all predispose him to difficult airway), he was intubated prior to ICU admission. Trach placed on 3/2 by ENT. 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 Prior to admission pt eating and drinking WNL, speech WNL other than cogntiive deficits.    Pertinent Vitals Pain Assessment: No/denies pain  SLP Plan  Continue with current plan of care    Recommendations Diet recommendations: NPO (consider repeat MBS ) Liquids provided via: Teaspoon Medication Administration: Via alternative means Compensations: Slow rate;Small sips/bites;Clear throat intermittently (with trials ) Postural Changes and/or Swallow Maneuvers: Seated upright 90 degrees (with trials )      Patient may use Passy-Muir Speech Valve: Intermittently with supervision PMSV Supervision: Full       Oral Care Recommendations: Oral care Q4 per protocol Follow up Recommendations: Home health SLP Plan: Continue with current plan of care    GO     Jammie Clink, Melanee Spryicole L 04/20/2014, 1:10 PM

## 2014-04-20 NOTE — Progress Notes (Signed)
Pt requested nurse call his mother and ask if she was coming in. Nurse called pt's mom and he listened to her on phone. Nurse talked to his mother and she stated she would be in tomorrow. Pt ok with this. Pt has stated no complaints of pain today. Pt's iv not working for antibiotic to flow through and keeps stating occluded and IV team placed new IV yesterday and unable to thread all way but it flushes well for IV push. Pt has not received full dose of antibiotic yesterday and none of todays. IV team states pt a PICC candidate but pt suppose to be d/c to home today that was delayed and pharmacy added more antibiotic doses. Called elink left msg. For md to change iv med to po to crush and put through peg tube if possible.

## 2014-04-20 NOTE — Progress Notes (Signed)
   04/20/14 1010  Oxygen Therapy/Pulse Ox  O2 Device Tracheostomy Collar  O2 Therapy Room air humidified  O2 Flow Rate (L/min) 5 L/min  FiO2 (%) 21 %  SpO2 92 %  Per case manager to assess 02 dependency.

## 2014-04-20 NOTE — Progress Notes (Signed)
PULMONARY / CRITICAL CARE MEDICINE   Name: [name] MRN: [id number] DOB: 12/01/1990    ADMISSION DATE:  03/31/2014 CONSULTATION DATE:  04/20/2014  REFERRING MD :  EDP  CHIEF COMPLAINT:  SOB  INITIAL PRESENTATION:  23 [facility] with SOB &  hypoxic with sats in 60's.  He was placed on BiPAP but repeat ABG 3 hours later showed progressive respiratory decline.  Given his hx of cervical fusion, micrognathia, prior trach, etc. (which all predispose him to difficult airway), he was intubated prior to ICU admission.  STUDIES:  CTA chest 2/24 >>> negative for PE, limited study due to motion.  SIGNIFICANT EVENTS: 2/24 Admit 2/27 Awake, follows commands on fent / precedex  2/28 Remains on PRVC, agitation / sedation issues.  Weaned briefly on 15/5 3/02 Trach placement [facility]([place]) 3/04 Lots of agitation. Bedside sitter ordered. PT ordered. Tolerated PS 20 cm H2O 3/8 Severe agitation despite frequent sedation PRN push.  3/11 Gastrostomy per IR   SUBJECTIVE:  Awake & interactive Tolerating TC well.   VITAL SIGNS: Temp:  [97.1 F (36.2 C)-98 F (36.7 C)] 97.4 F (36.3 C) (03/15 0759) Pulse Rate:  [76-102] 76 (03/15 1018) Resp:  [14-42] 23 (03/15 1018) BP: (94-111)/(49-70) 111/70 mmHg (03/15 0833) SpO2:  [85 %-99 %] 99 % (03/15 1018) FiO2 (%):  [21 %-28 %] 28 % (03/15 1016) Weight:  [62 kg (136 lb 11 oz)] 62 kg (136 lb 11 oz) (03/15 0337)   HEMODYNAMICS:     VENTILATOR SETTINGS: Vent Mode:  [-]  FiO2 (%):  [21 %-28 %] 28 %   INTAKE / OUTPUT: Intake/Output      03/14 0701 - 03/15 0700 03/15 0701 - 03/16 0700   I.V. (mL/kg)     Other 280    NG/GT 1820    IV Piggyback     Total Intake(mL/kg) 2100 (33.9)    Urine (mL/kg/hr) 300 (0.2)    Stool 0 (0)    Total Output 300     Net +1800          Urine Occurrence 1 x    Stool Occurrence 2 x 1 x    PHYSICAL EXAMINATION: General: young male with trach  NAD Neuro: resolved agitation, moves all ext equal,  cooperative  HEENT:  NAD, tracheostomy site intact Cardiovascular: s1 s2 Reg, no M.  Lungs: anterior reduced, barrel chest Abdomen:mildly distended no sign tenderness/ rebound/ bs diminished  Ext: R club foot, R hemiatrophy, no edema, no changes  LABS: Labs ordered/ reviewed   Recent Labs Lab 04/15/14 0536 04/16/14 0334 04/20/14 0320  NA 147* 144 142  K 2.7* 5.1 5.8*  CL 103 102 96  CO2 36* 30 35*  BUN 52* 39* 16  CREATININE 1.04 0.92 0.79  GLUCOSE 142* 104* 113*    Recent Labs Lab 04/14/14 0414 04/15/14 0536 04/20/14 0320  HGB 16.2 13.7 13.2  HCT 55.7* 46.9 44.2  WBC 11.2* 7.1 5.0  PLT 300 312 164    Impression/plan  Hx Insomnia, Tourette's syndrome, tic disorder, scoliosis s/p surgical fixation, right hemiatrophy, right club foot (at baseline highly functional) Acute metabolic encephalopathy & Agitation and uncooperativeness  Apparent extrapyramidal reaction to Haldol.  Discussed with pharmacy. Resolved off Haldol. Delirium, resolved All above resolved.  New: breathing well, spoke with mother, will work towards decannulation. Plan:   Cont clonazepam-->taper  D/c fentanyl patch Avoiding dop antagonists Sitter @ bedside  Depakote per pharmacy - has made a huge difference - keep ,  lft in future change to PO-->taper   Acute hypercarbic and hypoxic respiratory failure Restrictive physiology due to severe scoliosis Trach status [facility]) 3/03 >>  ARDS -resolved Plan:   Tolerating trach collar 24/7  Will downsize and cap.  May consider decannulation   Hypertension Plan:  Clonidine  Telemetry monitoring  Dysphagia due to trach status Difficult NGT placement Constipation Plan   SUP: Pantoprazole. TFs titrate up to goal as tol, will increase to goal. Swallow evaluation and PMV evaluation. Wonder if he will do better w/ 4 cuffless.   Suspected CAP, treated Poor weaning, fevers, persistent infiltrates-->completed rx for possible HCAP   Simonne Martinet ACNP-BC [facility] Pulmonary/Critical Care Pager # [phone] OR # [phone] if no answer  Patient is improving rapidly, not sure if will need trach, breathing well, spoke with mother, he was functional prior to this incident and was able to breath without O2, able to phonate, will downgrade to 4 cuffless trach on route to decannulation, will also reorder swallow evaluation and continue physical therapy.  Patient seen and examined, agree with above note.  I dictated the care and orders written for this patient under my direction.  Alyson Reedy, MD [phone]

## 2014-04-21 ENCOUNTER — Inpatient Hospital Stay (HOSPITAL_COMMUNITY): Payer: Medicaid Other

## 2014-04-21 LAB — GLUCOSE, CAPILLARY
GLUCOSE-CAPILLARY: 113 mg/dL — AB (ref 70–99)
GLUCOSE-CAPILLARY: 111 mg/dL — AB (ref 70–99)
GLUCOSE-CAPILLARY: 115 mg/dL — AB (ref 70–99)
Glucose-Capillary: 97 mg/dL (ref 70–99)
Glucose-Capillary: 143 mg/dL — ABNORMAL HIGH (ref 70–99)
Glucose-Capillary: 112 mg/dL — ABNORMAL HIGH (ref 70–99)

## 2014-04-21 MED ORDER — VALPROIC ACID 250 MG/5ML PO SYRP
125.0000 mg | ORAL_SOLUTION | Freq: Every day | ORAL | Status: DC
  Administered 2014-04-22 – 2014-04-27 (×6): 125 mg
  Filled 2014-04-21 (×8): qty 2.5

## 2014-04-21 MED ORDER — CLONAZEPAM 0.5 MG PO TABS
0.5000 mg | ORAL_TABLET | Freq: Every day | ORAL | Status: DC
  Administered 2014-04-22 – 2014-04-24 (×3): 0.5 mg
  Filled 2014-04-21 (×3): qty 1

## 2014-04-21 NOTE — Progress Notes (Signed)
Speech Language Pathology Treatment: Hillary BowPassy Muir Speaking valve  Patient Details Name: [name] MRN: [id number] DOB: 06/19/1990 Today's Date: 04/21/2014 Time: [id number] SLP Time Calculation (min) (ACUTE ONLY): 12 min  Assessment / Plan / Recommendation Clinical Impression  SLP arrived to room to retrieve PMSV for MBS today; pt holding out phone, mother on the line but unable to communicate with [name] due to aphonia. SLP placed PMSV and provided verbal cues for increased breath support and volume at conversation level. Phonation improved since yesterday, but extra effort still needed for audible volume and redirection of air past tracheostomy. Vital signs stable, no evidence of trapping. Recommend pt proceed with MBS for possible diet initiation. Mother in agreement.    HPI HPI: 24 [facility]. [facility] with SOB & hypoxic with sats in 60's. He was placed on BiPAP but repeat ABG 3 hours later showed progressive respiratory decline. Given his hx of cervical fusion, micrognathia, prior trach, etc. (which all predispose him to difficult airway), he was intubated prior to ICU admission. Trach placed on 3/2 by ENT. Per grandmother pt has a history of trach from birth to 89. Prior to admission pt eating and drinking WNL, speech WNL other than cogntiive deficits.    Pertinent Vitals    SLP Plan  Continue with current plan of care;MBS    Recommendations        Patient may use Passy-Muir Speech Valve: Intermittently with supervision PMSV Supervision: Full       Oral Care Recommendations: Oral care Q4 per protocol Follow up Recommendations: Home health SLP Plan: Continue with current plan of care;MBS    GO    Aaron DittyBonnie Jaramiah Bossard, MA CCC-SLP [phone]  [name], [name] 04/21/2014, 9:24 AM

## 2014-04-21 NOTE — Progress Notes (Signed)
PULMONARY / CRITICAL CARE MEDICINE   Name: [name] MRN: [id number] DOB: 1990-11-05    ADMISSION DATE:  03/31/2014 CONSULTATION DATE:  04/21/2014  REFERRING MD :  EDP  CHIEF COMPLAINT:  SOB  INITIAL PRESENTATION:  23 yobm brought to [facility] ED 2/24 with SOB &  hypoxic with sats in 60's.  He was placed on BiPAP but repeat ABG 3 hours later showed progressive respiratory decline.  Given his hx of cervical fusion, micrognathia, prior trach, etc. (which all predispose him to difficult airway), he was intubated prior to ICU admission.  STUDIES:  CTA chest 2/24 >>> negative for PE, limited study due to motion.  SIGNIFICANT EVENTS: 2/24 Admit 2/27 Awake, follows commands on fent / precedex  2/28 Remains on PRVC, agitation / sedation issues.  Weaned briefly on 15/5 3/02 Trach placement [facility]) 3/04 Lots of agitation. Bedside sitter ordered. PT ordered. Tolerated PS 20 cm H2O 3/8 Severe agitation despite frequent sedation PRN push.  3/11 Gastrostomy per IR   SUBJECTIVE:  Awake & interactive Tolerating TC and PMV well    VITAL SIGNS: Temp:  [97.4 F (36.3 C)-97.6 F (36.4 C)] 97.6 F (36.4 C) (03/16 0803) Pulse Rate:  [71-112] 90 (03/16 0803) Resp:  [16-31] 21 (03/16 0803) BP: (90-115)/(52-72) 112/62 mmHg (03/16 0803) SpO2:  [92 %-100 %] 96 % (03/16 0803) FiO2 (%):  [0.4 %-35 %] 0.4 % (03/16 0803) Weight:  [60 kg (132 lb 4.4 oz)] 60 kg (132 lb 4.4 oz) (03/16 0500)   HEMODYNAMICS:     VENTILATOR SETTINGS: Vent Mode:  [-]  FiO2 (%):  [0.4 %-35 %] 0.4 % -->28%  INTAKE / OUTPUT: Intake/Output      03/15 0701 - 03/16 0700 03/16 0701 - 03/17 0700   Other 110    NG/GT 1265    IV Piggyback 0    Total Intake(mL/kg) 1375 (22.9)    Urine (mL/kg/hr) 200 (0.1) 250 (1.2)   Stool 0 (0)    Total Output 200 250   Net +1175 -250        Urine Occurrence 1 x    Stool Occurrence 2 x     PHYSICAL EXAMINATION: General: young male with trach  NAD Neuro: resolved agitation, moves all ext  equal, cooperative  HEENT:  NAD, tracheostomy site intact, # 4 strength and phonation improving daily  Cardiovascular: s1 s2 Reg, no M.  Lungs: anterior reduced, barrel chest, no accessory muscle use  Abdomen:mildly distended no sign tenderness/ rebound/ bs diminished  Ext: R club foot, R hemiatrophy, no edema, no changes  LABS: Labs ordered/ reviewed   Recent Labs Lab 04/15/14 0536 04/16/14 0334 04/20/14 0320  NA 147* 144 142  K 2.7* 5.1 5.8*  CL 103 102 96  CO2 36* 30 35*  BUN 52* 39* 16  CREATININE 1.04 0.92 0.79  GLUCOSE 142* 104* 113*    Recent Labs Lab 04/15/14 0536 04/20/14 0320  HGB 13.7 13.2  HCT 46.9 44.2  WBC 7.1 5.0  PLT 312 164    Impression/plan  Hx Insomnia, Tourette's syndrome, tic disorder, scoliosis s/p surgical fixation, right hemiatrophy, right club foot (at baseline highly functional) Acute metabolic encephalopathy & Agitation and uncooperativeness  Apparent extrapyramidal reaction to Haldol.  Discussed with pharmacy. Resolved off Haldol. Delirium, resolved All above resolved.  New: breathing well, spoke with mother, will work towards decannulation. Plan:   Cont clonazepam-->taper  D/c fentanyl patch Avoiding dop antagonists  Cont depakote taper  Ultimate goal is to get all sedating meds  off and have him only on his home rx.    Acute hypercarbic and hypoxic respiratory failure Restrictive physiology due to severe scoliosis Trach status [facility]) 3/03 >>> changed to 4 cuffless 3/15 ARDS -resolved Plan:   Cont PMV as much as tolerated May consider decannulation Monday if doing well over weekend  Hypertension Plan:  Clonidine  Telemetry monitoring  Dysphagia due to trach status Difficult NGT placement Constipation >passed MBS 3/16 Plan   SUP: Pantoprazole. Adv diet.  Dc tubefeeds   Suspected CAP, treated Fevers, persistent infiltrates-->completed rx for possible HCAP  Respiratory status slowly improving, continue down sizing  of the trach, continue rehab, will need to get him off sedating medications with taper of clonazepam and depakote, d/c fentanyl patch, anticipate decannulation on Monday.  Simonne MartinetPeter E Babcock, NP  Patient seen and examined, agree with above note.  I dictated the care and orders written for this patient under my direction.  Alyson ReedyWesam G Deanna Wiater, MD [phone]

## 2014-04-21 NOTE — Progress Notes (Signed)
NUTRITION FOLLOW-UP  INTERVENTION: - Monitor PO intake  NUTRITION DIAGNOSIS: Inadequate oral intake related to inability to eat as evidenced by NPO status; resolved.  Goal: Pt to meet >/= 90% of estimated energy requirements; progressing.  Monitor:  PO intake, weight, labs  ASSESSMENT:  24 [facility] He was intubated prior to ICU admission. PMH of Tourette's, Tic disorder, Scoliosis, hemiatrophy of right leg, and Club foot.   3/2 placed trach, pt now off vent. Pt had PEG placed 3/11. Was fed through PEG until this AM.  Passed SLP exam, currently on regular diet.    Note: 17 lb wt loss since admission.  11% in 1 month (significant for time frame).  Expect to consume 100% of meals at this time.  Pt reports no changes in appetite or weight PTA and finished most of his meal during interview, saved his cake for later.  Will continue to monitor.         Labs: K elevated  Height: Ht Readings from Last 1 Encounters:  03/31/14 4\' 10"  (1.473 m)    Weight: Wt Readings from Last 1 Encounters:  04/21/14 132 lb 4.4 oz (60 kg)  04/01/14 149 lb  BMI:  Body mass index is 27.65 kg/(m^2).   Estimated Nutritional Needs: Kcal: 1700-1900 Protein: 85-100 grams Fluid: >/= 1.7 L daily  Skin: closed incision on neck  Diet Order:    Intake/Output Summary (Last 24 hours) at 04/21/14 0939 Last data filed at 04/21/14 0816  Gross per 24 hour  Intake   1275 ml  Output    450 ml  Net    825 ml    Last BM: 3/15  Labs:   Recent Labs Lab 04/15/14 0536 04/16/14 0334 04/20/14 0320  NA 147* 144 142  K 2.7* 5.1 5.8*  CL 103 102 96  CO2 36* 30 35*  BUN 52* 39* 16  CREATININE 1.04 0.92 0.79  CALCIUM 8.5 8.7 8.7  MG  --   --  2.4  PHOS  --   --  3.4  GLUCOSE 142* 104* 113*    CBG (last 3)   Recent Labs  04/21/14 0102 04/21/14 0500 04/21/14 0802  GLUCAP 97 113* 111*    Scheduled Meds: . antiseptic oral rinse  7 mL Mouth Rinse QID  . chlorhexidine   15 mL Mouth Rinse BID  . clonazePAM  0.5 mg Per Tube BID  . cloNIDine  0.1 mg Per Tube QHS  . enoxaparin (LOVENOX) injection  40 mg Subcutaneous Q24H  . free water  250 mL Per Tube 3 times per day  . insulin aspart  0-9 Units Subcutaneous 6 times per day  . LORazepam  1 mg Intravenous Once  . pantoprazole sodium  40 mg Per Tube Daily  . polyethylene glycol  17 g Oral Daily  . Valproic Acid  125 mg Per Tube BID    Magdalen SpatzLauren Anida Deol MS Dietetic Intern Pager Number [phone]

## 2014-04-21 NOTE — Procedures (Cosign Needed)
Objective Swallowing Evaluation: Modified Barium Swallowing Study  Patient Details  Name: [name] MRN: [id number] Date of Birth: 12/29/1990  Today's Date: 04/21/2014 Time: SLP Start Time (ACUTE ONLY): 0955-SLP Stop Time (ACUTE ONLY): 1019 SLP Time Calculation (min) (ACUTE ONLY): 24 min  Past Medical History:  Past Medical History  Diagnosis Date  . Tourette's syndrome   . Tic disorder   . Scoliosis   . Hemiatrophy of right leg   . Club foot     Right   Past Surgical History:  Past Surgical History  Procedure Laterality Date  . Foot surgery Right 2002  . Eye muscle surgery Bilateral 1998 and 2002  . Femoral hernia repair      Done when he was an infant  . Harrington rod surgery  2003    For Scoliosis  . Tracheostomy tube placement N/A 04/07/2014    Procedure: TRACHEOSTOMY;  Surgeon: Flo ShanksKarol Wolicki, MD;  [facility];  Service: ENT;  Laterality: N/A;   HPI:  HPI: 24 y.o. M [facility] with SOB & hypoxic with sats in 60's. He was placed on BiPAP but repeat ABG 3 hours later showed progressive respiratory decline. Given his hx of cervical fusion, micrognathia, prior trach, etc. (which all predispose him to difficult airway), he was intubated prior to ICU admission. Trach placed on 3/2 by ENT. 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 Prior to admission pt eating and drinking WNL, speech WNL other than cogntiive deficits.   No Data Recorded  Assessment / Plan / Recommendation CHL IP CLINICAL IMPRESSIONS 04/21/2014  Dysphagia Diagnosis Moderate oral phase dysphagia;Mild pharyngeal phase dysphagia  Clinical impression Pt demonstrates significant improvement in oropharyngeal sensation and timing with PMSV in place, tolerating well. Pt continues to have moderate oral dysphagia with lingual thrusting and pumping behavior to transit bolus, due to decreased mandibular mobility and lingual control. Oropharyngeal phase mildly impaired with delayed swallow, but  only one instance of trace sensed penetration with nectar thick liquids on initial sip. Pt able to consume large consecutive sips of thin liquids without any penetration, aspiration or residuals. Recommend pt initiate a Regular diet and thin liquids with PMSV in place. Pt may begin wearing PMSV during all waking hours with intermittent supervision due to excellent tolerance of the valve over two observations today. SLP to f/u for tolerance.       CHL IP TREATMENT RECOMMENDATION 04/21/2014  Treatment Plan Recommendations Therapy as outlined in treatment plan below     CHL IP DIET RECOMMENDATION 04/21/2014  Diet Recommendations Regular;Thin liquid  Liquid Administration via Cup;Straw  Medication Administration Whole meds with liquid  Compensations (None)  Postural Changes and/or Swallow Maneuvers Seated upright 90 degrees     CHL IP OTHER RECOMMENDATIONS 04/21/2014  Recommended Consults (None)  Oral Care Recommendations Oral care BID  Other Recommendations Place PMSV during PO intake     CHL IP FOLLOW UP RECOMMENDATIONS 04/21/2014  Follow up Recommendations Home health SLP     CHL IP FREQUENCY AND DURATION 04/21/2014  Speech Therapy Frequency (ACUTE ONLY) min 2x/week  Treatment Duration 2 weeks     Pertinent Vitals/Pain NA     SLP Swallow Goals No flowsheet data found.  No flowsheet data found.    CHL IP REASON FOR REFERRAL 04/21/2014  Reason for Referral Objectively evaluate swallowing function     CHL IP ORAL PHASE 04/21/2014  Lips (None)  Tongue (None)  Mucous membranes (None)  Nutritional status (None)  Other (None)  Oxygen therapy (None)  Oral Phase Impaired  Oral - Pudding Teaspoon (None)  Oral - Pudding Cup (None)  Oral - Honey Teaspoon NT  Oral - Honey Cup (None)  Oral - Honey Syringe (None)  Oral - Nectar Teaspoon Delayed oral transit;Lingual pumping;Piecemeal swallowing  Oral - Nectar Cup NT  Oral - Nectar Straw (None)  Oral - Nectar Syringe (None)  Oral - Ice  Chips (None)  Oral - Thin Teaspoon NT  Oral - Thin Cup Delayed oral transit;Lingual pumping;Piecemeal swallowing  Oral - Thin Straw Delayed oral transit;Lingual pumping;Piecemeal swallowing  Oral - Thin Syringe (None)  Oral - Puree Delayed oral transit;Lingual pumping;Piecemeal swallowing  Oral - Mechanical Soft (None)  Oral - Regular Delayed oral transit;Lingual pumping;Piecemeal swallowing  Oral - Multi-consistency (None)  Oral - Pill Delayed oral transit;Lingual pumping;Piecemeal swallowing  Oral Phase - Comment (None)      CHL IP PHARYNGEAL PHASE 04/21/2014  Pharyngeal Phase Impaired  Pharyngeal - Pudding Teaspoon (None)  Penetration/Aspiration details (pudding teaspoon) (None)  Pharyngeal - Pudding Cup (None)  Penetration/Aspiration details (pudding cup) (None)  Pharyngeal - Honey Teaspoon NT  Penetration/Aspiration details (honey teaspoon) (None)  Pharyngeal - Honey Cup (None)  Penetration/Aspiration details (honey cup) (None)  Pharyngeal - Honey Syringe (None)  Penetration/Aspiration details (honey syringe) (None)  Pharyngeal - Nectar Teaspoon Delayed swallow initiation;Penetration/Aspiration before swallow  Penetration/Aspiration details (nectar teaspoon) Material does not enter airway;Material enters airway, remains ABOVE vocal cords then ejected out  Pharyngeal - Nectar Cup NT  Penetration/Aspiration details (nectar cup) (None)  Pharyngeal - Nectar Straw (None)  Penetration/Aspiration details (nectar straw) (None)  Pharyngeal - Nectar Syringe (None)  Penetration/Aspiration details (nectar syringe) (None)  Pharyngeal - Ice Chips (None)  Penetration/Aspiration details (ice chips) (None)  Pharyngeal - Thin Teaspoon NT  Penetration/Aspiration details (thin teaspoon) (None)  Pharyngeal - Thin Cup Delayed swallow initiation  Penetration/Aspiration details (thin cup) (None)  Pharyngeal - Thin Straw Delayed swallow initiation  Penetration/Aspiration details (thin straw)  (None)  Pharyngeal - Thin Syringe (None)  Penetration/Aspiration details (thin syringe') (None)  Pharyngeal - Puree Delayed swallow initiation  Penetration/Aspiration details (puree) (None)  Pharyngeal - Mechanical Soft (None)  Penetration/Aspiration details (mechanical soft) (None)  Pharyngeal - Regular Delayed swallow initiation  Penetration/Aspiration details (regular) (None)  Pharyngeal - Multi-consistency (None)  Penetration/Aspiration details (multi-consistency) (None)  Pharyngeal - Pill Delayed swallow initiation  Penetration/Aspiration details (pill) (None)  Pharyngeal Comment (None)     No flowsheet data found.  No flowsheet data found.        Harlon Ditty, [place] CCC-SLP [phone]  Claudine Mouton 04/21/2014, 10:57 AM

## 2014-04-21 NOTE — Progress Notes (Signed)
Tolerating Sanmina-SCIPassey Muir valve well. Able to drink sprite visiting with his Grandmother. Pt aware that valve needs to come off when sleeping. Pt did call took off passey muir valve when he felt the need to be suctioned. D/C Tube feeding.

## 2014-04-22 LAB — GLUCOSE, CAPILLARY
GLUCOSE-CAPILLARY: 142 mg/dL — AB (ref 70–99)
Glucose-Capillary: 107 mg/dL — ABNORMAL HIGH (ref 70–99)
Glucose-Capillary: 138 mg/dL — ABNORMAL HIGH (ref 70–99)
Glucose-Capillary: 123 mg/dL — ABNORMAL HIGH (ref 70–99)
Glucose-Capillary: 96 mg/dL (ref 70–99)

## 2014-04-22 NOTE — Progress Notes (Signed)
ATC setup changed 

## 2014-04-22 NOTE — Progress Notes (Signed)
PULMONARY / CRITICAL CARE MEDICINE   Name: [name] MRN: [id number] DOB: 11-12-1990    ADMISSION DATE:  03/31/2014 CONSULTATION DATE:  04/22/2014  REFERRING MD :  EDP  CHIEF COMPLAINT:  SOB  INITIAL PRESENTATION:  23 yobm brought to [facility] ED 2/24 with SOB &  hypoxic with sats in 60's.  He was placed on BiPAP but repeat ABG 3 hours later showed progressive respiratory decline.  Given his hx of cervical fusion, micrognathia, prior trach, etc. (which all predispose him to difficult airway), he was intubated prior to ICU admission.  STUDIES:  CTA chest 2/24 >>> negative for PE, limited study due to motion.  SIGNIFICANT EVENTS: 2/24 Admit 2/27 Awake, follows commands on fent / precedex  2/28 Remains on PRVC, agitation / sedation issues.  Weaned briefly on 15/5 3/02 Trach placement [facility]) 3/04 Lots of agitation. Bedside sitter ordered. PT ordered. Tolerated PS 20 cm H2O 3/8 Severe agitation despite frequent sedation PRN push.  3/11 Gastrostomy per IR   SUBJECTIVE:  Awake & interactive Tolerating TC and PMV well    VITAL SIGNS: Temp:  [97.1 F (36.2 C)-98.3 F (36.8 C)] 97.6 F (36.4 C) (03/17 0743) Pulse Rate:  [69-111] 91 (03/17 0748) Resp:  [19-30] 19 (03/17 0748) BP: (116-134)/(61-96) 127/69 mmHg (03/17 0748) SpO2:  [92 %-100 %] 99 % (03/17 0748) FiO2 (%):  [28 %] 28 % (03/17 0748) Weight:  [62.7 kg (138 lb 3.7 oz)] 62.7 kg (138 lb 3.7 oz) (03/17 0448)   HEMODYNAMICS:     VENTILATOR SETTINGS: Vent Mode:  [-]  FiO2 (%):  [28 %] 28 % -->28%  INTAKE / OUTPUT: Intake/Output      03/16 0701 - 03/17 0700 03/17 0701 - 03/18 0700   P.O. 1025    Other     NG/GT 220    IV Piggyback     Total Intake(mL/kg) 1245 (19.9)    Urine (mL/kg/hr) 1650 (1.1)    Stool     Total Output 1650     Net -405           PHYSICAL EXAMINATION: General: young male with trach  NAD Neuro: resolved agitation, moves all ext equal, cooperative  HEENT:  NAD, tracheostomy site intact, # 4  strength and phonation improving daily  Cardiovascular: s1 s2 Reg, no M.  Lungs: anterior reduced, barrel chest, no accessory muscle use  Abdomen:mildly distended no sign tenderness/ rebound/ bs diminished  Ext: R club foot, R hemiatrophy, no edema, no changes  LABS: Labs ordered/ reviewed   Recent Labs Lab 04/16/14 0334 04/20/14 0320  NA 144 142  K 5.1 5.8*  CL 102 96  CO2 30 35*  BUN 39* 16  CREATININE 0.92 0.79  GLUCOSE 104* 113*    Recent Labs Lab 04/20/14 0320  HGB 13.2  HCT 44.2  WBC 5.0  PLT 164    Impression/plan  Hx Insomnia, Tourette's syndrome, tic disorder, scoliosis s/p surgical fixation, right hemiatrophy, right club foot (at baseline highly functional) Acute metabolic encephalopathy & Agitation and uncooperativeness  Apparent extrapyramidal reaction to Haldol.  Discussed with pharmacy. Resolved off Haldol. Delirium, resolved All above resolved.  New: breathing well, spoke with mother, will work towards decannulation.  Plan:   Cont clonazepam-->continue to taper  D/c fentanyl patch Avoiding dop antagonists  Cont depakote taper  Ultimate goal is to get all sedating meds off and have him only on his home rx.   Acute hypercarbic and hypoxic respiratory failure Restrictive physiology due to severe scoliosis  Trach status [facility]) 3/03 >>> changed to 4 cuffless 3/15 ARDS -resolved Plan:   Cont PMV as much as tolerated May consider decannulation Monday if doing well over weekend Speech pathology from yesterday's note demonstrated significant improvement in his dysphagia Will begin capping trials at night starting Friday night, if capped without difficulty til Monday then will decannulate on Monday.  Hypertension Plan:  Clonidine  Telemetry monitoring  Dysphagia due to trach status Difficult NGT placement Constipation >passed MBS 3/16 Plan   SUP: Pantoprazole. Adv diet.  Dc tubefeeds   Suspected CAP, treated Fevers, persistent  infiltrates-->completed rx for possible HCAP  Alyson ReedyWesam G. Kylani Wires, M.D. [facility]monary/Critical [facility] Medicine. Pager: [phone]. After hours pager: [phone].

## 2014-04-22 NOTE — Trach Care Team (Signed)
Trach Care Progression Note   Patient Details Name: [name] [name] MRN: [id number] DOB: 05/17/1990 Today's Date: 04/22/2014   Tracheostomy Assessment    Tracheostomy Shiley 4 mm Uncuffed (Active)  Status Secured;Passy Muir Speaking valve 04/22/2014 11:25 AM  Site Assessment Dry;Clean 04/22/2014 11:25 AM  Site Care Cleansed;Dried;Dressing applied 04/22/2014  5:00 AM  Inner Cannula Care Cleansed/dried 04/22/2014  5:00 AM  Ties Assessment Clean;Secure;Dry 04/22/2014 11:25 AM  Cuff pressure (cm) 0 cm 04/22/2014 11:25 AM  Trach Changed Yes 04/20/2014 11:00 AM  Emergency Equipment at bedside Yes 04/22/2014 11:25 AM     Care Needs     Respiratory Therapy Tracheostomy: New trach O2 Device: Tracheostomy Collar FiO2 (%): 28 % SpO2: 95 % Education:  (not needed) Who was educated?:  (none at this time) Follow up recommendations:  (will follow for pt progress) Respiratory barriers to progression:      Speech Language Pathology  SLP chart review complete: Patient currently receiving at SLP services Patient may use Passy-Muir Speech Valve: During all waking hours (remove during sleep) PMSV Supervision: Intermittent MD: Please consider changing trach tube to : Smaller size Follow up Recommendations: Home health SLP   Physical Therapy Ambulation/Gait assistance: Min guard PT Recommendation/Assessment: Patient needs continued PT services Follow Up Recommendations: Supervision/Assistance - 24 hour PT equipment: Wheelchair (measurements PT), Rolling walker with 5" wheels    Occupational Therapy      Nutritional Patient's Current Diet: Regular Tube Feeding: Vital AF 1.2 Cal Tube Feeding Frequency: Continuous Tube Feeding Strength: Full strength Diet Recommendations: Regular, Thin liquid    Case Management/Social Work Level of patient care prior to hospitalization: Home-Self care Living status: Family (document relation) Insurance payer: Medicaid Anticipated discharge disposition: Home-Home  Programmer, systemsHealth    Provider Trach Care Team/Provider Recommendations Trach Care Team Members Present-  Mat CarneJustin Hopper, RT, GaylordJenna Holloman, SW, Joaquin CourtsKimberly Harris, RD, Harlon DittyBonnie DeBlois, SLP    Tolerating Trach collar; possible decannulation. Continue to follow.           Brecken Walth, Silva BandyDebra Anita (Scribe for Team) 04/22/2014, 1:21 PM

## 2014-04-22 NOTE — Progress Notes (Signed)
Speech Language Pathology Treatment: Hillary BowPassy Muir Speaking valve;Dysphagia  Patient Details Name: [name] MRN: [id number] DOB: 04/02/1990 Today's Date: 04/22/2014 Time: [id number] SLP Time Calculation (min) (ACUTE ONLY): 31 min  Assessment / Plan / Recommendation Clinical Impression  Pt demonstrates excellent tolerance of PMSV without air trapping or significant fluctuation in vital signs. Pt does continue to need max verbal cues to increased volume and word and phrase level, breath support to redirect air past trach is poor. Pt tolerating regular diet and thin liquids with mild oral dysphagia, present at baseline. Pt may continue current diet. Will f/u for further voice training. Continue use of PMSV during all waking hours with intermittent supervision.    HPI HPI: 24 [facility].o. [facility] with SOB & hypoxic with sats in 60's. He was placed on BiPAP but repeat ABG 3 hours later showed progressive respiratory decline. Given his hx of cervical fusion, micrognathia, prior trach, etc. (which all predispose him to difficult airway), he was intubated prior to ICU admission. Trach placed on 3/2 by ENT. Per grandmother pt has a history of trach from birth to 89. Prior to admission pt eating and drinking WNL, speech WNL other than cogntiive deficits.    Pertinent Vitals Pain Assessment: No/denies pain  SLP Plan  Continue with current plan of care    Recommendations Diet recommendations: Regular;Thin liquid Liquids provided via: Cup;Straw Medication Administration: Whole meds with liquid Supervision: Intermittent supervision to cue for compensatory strategies Compensations: Slow rate;Small sips/bites;Clear throat intermittently Postural Changes and/or Swallow Maneuvers: Seated upright 90 degrees      Patient may use Passy-Muir Speech Valve: During all waking hours (remove during sleep) PMSV Supervision: Intermittent       Oral Care Recommendations: Oral care BID Follow up  Recommendations: Home health SLP Plan: Continue with current plan of care    GO    Pa[facility] General HospitalBonnie Jyren Cerasoli, MA CCC-SLP [phone]  Claudine MoutonDeBlois, Meilah Delrosario Caroline 04/22/2014, 10:13 AM

## 2014-04-22 NOTE — Progress Notes (Signed)
Physical Therapy Treatment Patient Details Name: [name] MRN: [id number] DOB: 07/28/1990 Today's Date: 04/22/2014    History of Present Illness      PT Comments    Excellent progress noted. Pt very motivated to participate in PT.  Follow Up Recommendations  Supervision/Assistance - 24 hour     Equipment Recommendations  Wheelchair (measurements PT);Rolling walker with 5" wheels    Recommendations for Other Services       Precautions / Restrictions Precautions Precautions: Fall Precaution Comments: Trach, on ATC Restrictions Weight Bearing Restrictions: No    Mobility  Bed Mobility         Supine to sit: Supervision Sit to supine: Supervision   General bed mobility comments: decreased impulsivity, fair safety awareness  Transfers   Equipment used: Rolling walker (2 wheeled)   Sit to Stand: Min guard Stand pivot transfers: Min guard       General transfer comment: verbal cues for hand placement  Ambulation/Gait Ambulation/Gait assistance: Min guard Ambulation Distance (Feet): 200 Feet Assistive device: Rolling walker (2 wheeled) Gait Pattern/deviations: Step-through pattern;Decreased stride length;Trunk flexed Gait velocity: mildly decreased   General Gait Details: verbal cues to stay close to RW and for stearing RW   Stairs            Wheelchair Mobility    Modified Rankin (Stroke Patients Only)       Balance                                    Cognition Arousal/Alertness: Awake/alert Behavior During Therapy: WFL for tasks assessed/performed Overall Cognitive Status: Within Functional Limits for tasks assessed                      Exercises      General Comments        Pertinent Vitals/Pain Pain Assessment: No/denies pain    Home Living                      Prior Function            PT Goals (current goals can now be found in the care plan section) Progress towards PT goals:  Progressing toward goals    Frequency  Min 3X/week    PT Plan Current plan remains appropriate    Co-evaluation             End of Session Equipment Utilized During Treatment: Gait belt;Oxygen Activity Tolerance: Patient tolerated treatment well Patient left: in bed;with call bell/phone within reach;with nursing/sitter in room     Time: 0903-0927 PT Time Calculation (min) (ACUTE ONLY): 24 min  Charges:  $Gait Training: 23-37 mins                    G Codes:      [name], [name] 04/22/2014, 10:09 AM

## 2014-04-23 LAB — GLUCOSE, CAPILLARY
GLUCOSE-CAPILLARY: 103 mg/dL — AB (ref 70–99)
Glucose-Capillary: 122 mg/dL — ABNORMAL HIGH (ref 70–99)
Glucose-Capillary: 151 mg/dL — ABNORMAL HIGH (ref 70–99)
Glucose-Capillary: 200 mg/dL — ABNORMAL HIGH (ref 70–99)
Glucose-Capillary: 98 mg/dL (ref 70–99)

## 2014-04-23 NOTE — Progress Notes (Signed)
RT capped patient's trach per MD per MD order. RT suctioned patient before cap was applied. Patient is currently on room air.  No distress noted. Patient tolerated procedure well. Vital signs stable. No complications. Grandmother in room. RN made aware. RT will continue to monitor.

## 2014-04-23 NOTE — Progress Notes (Signed)
PULMONARY / CRITICAL CARE MEDICINE   Name: [name] MRN: [id number] DOB: 02/23/1990    ADMISSION DATE:  03/31/2014 CONSULTATION DATE:  04/23/2014  REFERRING MD :  EDP  CHIEF COMPLAINT:  SOB  INITIAL PRESENTATION:  23 [facility] with SOB &  hypoxic with sats in 60's.  He was placed on BiPAP but repeat ABG 3 hours later showed progressive respiratory decline.  Given his hx of cervical fusion, micrognathia, prior trach, etc. (which all predispose him to difficult airway), he was intubated prior to ICU admission.  STUDIES:  CTA chest 2/24 >>> negative for PE, limited study due to motion.  SIGNIFICANT EVENTS: 2/24 Admit 2/27 Awake, follows commands on fent / precedex  2/28 Remains on PRVC, agitation / sedation issues.  Weaned briefly on 15/5 3/02 Trach placement [facility]) 3/04 Lots of agitation. Bedside sitter ordered. PT ordered. Tolerated PS 20 cm H2O 3/8 Severe agitation despite frequent sedation PRN push.  3/11 Gastrostomy per IR   SUBJECTIVE:  Awake & interactive Tolerating TC and PMV well    VITAL SIGNS: Temp:  [98.3 F (36.8 C)-99.3 F (37.4 C)] 98.5 F (36.9 C) (03/18 1126) Pulse Rate:  [73-117] 95 (03/18 1126) Resp:  [16-33] 20 (03/18 1126) BP: (86-137)/(64-99) 120/71 mmHg (03/18 1126) SpO2:  [95 %-100 %] 100 % (03/18 0841) FiO2 (%):  [28 %] 28 % (03/18 1126) Weight:  [63.5 kg (139 lb 15.9 oz)] 63.5 kg (139 lb 15.9 oz) (03/18 0500)   HEMODYNAMICS:     VENTILATOR SETTINGS: Vent Mode:  [-]  FiO2 (%):  [28 %] 28 % -->28%  INTAKE / OUTPUT: Intake/Output      03/17 0701 - 03/18 0700 03/18 0701 - 03/19 0700   P.O. 360    NG/GT     Total Intake(mL/kg) 360 (5.7)    Urine (mL/kg/hr) 50 (0) 550 (1.5)   Stool 0 (0)    Total Output 50 550   Net +310 -550        Urine Occurrence 5 x    Stool Occurrence 4 x     PHYSICAL EXAMINATION: General: young male with trach  NAD Neuro: resolved agitation, moves all ext equal, cooperative  HEENT:  NAD,  tracheostomy site intact, # 4 strength and phonation improving daily  Cardiovascular: s1 s2 Reg, no M.  Lungs: anterior reduced, barrel chest, no accessory muscle use  Abdomen:mildly distended no sign tenderness/ rebound/ bs diminished  Ext: R club foot, R hemiatrophy, no edema, no changes  LABS: Labs ordered/ reviewed   Recent Labs Lab 04/20/14 0320  NA 142  K 5.8*  CL 96  CO2 35*  BUN 16  CREATININE 0.79  GLUCOSE 113*    Recent Labs Lab 04/20/14 0320  HGB 13.2  HCT 44.2  WBC 5.0  PLT 164    Impression/plan  Hx Insomnia, Tourette's syndrome, tic disorder, scoliosis s/p surgical fixation, right hemiatrophy, right club foot (at baseline highly functional) Acute metabolic encephalopathy & Agitation and uncooperativeness  Apparent extrapyramidal reaction to Haldol.  Discussed with pharmacy. Resolved off Haldol. Delirium, resolved All above resolved.  New: breathing well, spoke with mother, will work towards decannulation.  Plan:   Cont clonazepam-->continue to taper  D/c fentanyl patch Avoiding dop antagonists  Cont depakote taper  Ultimate goal is to get all sedating meds off and have him only on his home rx.   Acute hypercarbic and hypoxic respiratory failure Restrictive physiology due to severe scoliosis Trach status [facility]) 3/03 >>> changed  to 4 cuffless 3/15 ARDS -resolved Plan:   Cont PMV as much as tolerated May consider decannulation Monday if doing well over weekend Speech pathology from yesterday's note demonstrated significant improvement in his dysphagia Cap trach today and overnight, downgrade to a 4 cuffless.  Hypertension Plan:  Clonidine  Telemetry monitoring  Dysphagia due to trach status Difficult NGT placement Constipation >passed MBS 3/16 Plan   SUP: Pantoprazole. Adv diet.  Dc tubefeeds   Will cap trach today and keep capped, if capped without complications til Monday then will decannulate.  Alyson Reedy, M.D. [facility]  Pulmonary/Critical Care Medicine. Pager: [phone]. After hours pager: [phone].

## 2014-04-24 DIAGNOSIS — J80 Acute respiratory distress syndrome: Secondary | ICD-10-CM

## 2014-04-24 LAB — GLUCOSE, CAPILLARY
GLUCOSE-CAPILLARY: 109 mg/dL — AB (ref 70–99)
GLUCOSE-CAPILLARY: 166 mg/dL — AB (ref 70–99)
Glucose-Capillary: 155 mg/dL — ABNORMAL HIGH (ref 70–99)

## 2014-04-24 LAB — BASIC METABOLIC PANEL
ANION GAP: 9 (ref 5–15)
BUN: 6 mg/dL (ref 6–23)
CALCIUM: 8.5 mg/dL (ref 8.4–10.5)
CO2: 30 mmol/L (ref 19–32)
Chloride: 102 mmol/L (ref 96–112)
Creatinine, Ser: 0.78 mg/dL (ref 0.50–1.35)
GFR calc Af Amer: >90 mL/min (ref >90)
GLUCOSE: 149 mg/dL — AB (ref 70–99)
POTASSIUM: 4.1 mmol/L (ref 3.5–5.1)
Sodium: 141 mmol/L (ref 135–145)

## 2014-04-24 LAB — CBC
HEMATOCRIT: 40.5 % (ref 39.0–52.0)
Hemoglobin: 12.2 g/dL — ABNORMAL LOW (ref 13.0–17.0)
MCH: 29 pg (ref 26.0–34.0)
MCHC: 30.1 g/dL (ref 30.0–36.0)
MCV: 96.2 fL (ref 78.0–100.0)
PLATELETS: 275 10*3/uL (ref 150–400)
RBC: 4.21 MIL/uL — ABNORMAL LOW (ref 4.22–5.81)
RDW: 13.1 % (ref 11.5–15.5)
WBC: 9.2 10*3/uL (ref 4.0–10.5)

## 2014-04-24 LAB — PHOSPHORUS: Phosphorus: 5.3 mg/dL — ABNORMAL HIGH (ref 2.3–4.6)

## 2014-04-24 LAB — MAGNESIUM: MAGNESIUM: 1.8 mg/dL (ref 1.5–2.5)

## 2014-04-24 NOTE — Progress Notes (Signed)
PULMONARY / CRITICAL CARE MEDICINE   Name: [name] MRN: [id number] DOB: 09/25/1990    ADMISSION DATE:  03/31/2014 CONSULTATION DATE:  04/24/2014  REFERRING MD :  EDP  CHIEF COMPLAINT:  SOB  INITIAL PRESENTATION:  23 yobm [facility] with SOB &  hypoxic with sats in 60's.  He was placed on BiPAP but repeat ABG 3 hours later showed progressive respiratory decline.  Given his hx of cervical fusion, micrognathia, prior trach, etc. (which all predispose him to difficult airway), he was intubated prior to ICU admission.  STUDIES:  CTA chest 2/24 >>> negative for PE, limited study due to motion.  SIGNIFICANT EVENTS: 2/24 Admit 2/27 Awake, follows commands on fent / precedex  2/28 Remains on PRVC, agitation / sedation issues.  Weaned briefly on 15/5 3/02 Trach placement [facility]([place]) 3/04 Lots of agitation. Bedside sitter ordered. PT ordered. Tolerated PS 20 cm H2O 3/8 Severe agitation despite frequent sedation PRN push.  3/11 Gastrostomy per IR   SUBJECTIVE:  Awake & interactive On RA, trach capped    VITAL SIGNS: ON RA, trach capped  Temp:  [97.2 F (36.2 C)-98.5 F (36.9 C)] 97.2 F (36.2 C) (03/19 0729) Pulse Rate:  [85-136] 99 (03/19 0729) Resp:  [17-31] 27 (03/19 0729) BP: (118-128)/(71-89) 118/89 mmHg (03/19 0729) SpO2:  [74 %-100 %] 100 % (03/19 0800) FiO2 (%):  [28 %] 28 % (03/18 1525) Weight:  [60.8 kg (134 lb 0.6 oz)] 60.8 kg (134 lb 0.6 oz) (03/19 0402)       INTAKE / OUTPUT: Intake/Output      03/18 0701 - 03/19 0700 03/19 0701 - 03/20 0700   P.O. 360    Total Intake(mL/kg) 360 (5.9)    Urine (mL/kg/hr) 1200 (0.8)    Stool 0 (0)    Total Output 1200     Net -840          Stool Occurrence 2 x     PHYSICAL EXAMINATION: General: young male with trach  NAD Neuro: resolved agitation, moves all ext equal, cooperative  HEENT:  NAD, tracheostomy site intact, # 4 strength and phonation improving daily , PMV in place  Cardiovascular: s1 s2 Reg, no M.   Lungs: anterior reduced, barrel chest, no accessory muscle use  Abdomen:mildly distended no sign tenderness/ rebound/ bs diminished  Ext: R club foot, R hemiatrophy, no edema, no changes  LABS: Labs ordered/ reviewed   Recent Labs Lab 04/20/14 0320 04/24/14 0306  NA 142 141  K 5.8* 4.1  CL 96 102  CO2 35* 30  BUN 16 6  CREATININE 0.79 0.78  GLUCOSE 113* 149*    Recent Labs Lab 04/20/14 0320 04/24/14 0306  HGB 13.2 12.2*  HCT 44.2 40.5  WBC 5.0 9.2  PLT 164 275    Impression/plan Principal Problem:   Acute respiratory failure with hypoxia Active Problems:   Respiratory failure with hypoxia   Restrictive lung disease   Acute respiratory failure   Encounter for orogastric (OG) tube placement   History of ETT   Shortness of breath   Respiratory abnormalities   Pneumonia   Dysphagia   Encounter for feeding tube placement   ARDS (adult respiratory distress syndrome)   Hx Insomnia, Tourette's syndrome, tic disorder, scoliosis s/p surgical fixation, right hemiatrophy, 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  Discussed with pharmacy. Resolved off Haldol. Delirium, resolved All above resolved.   Plan:  Cont clonazepam-->continue to taper  Avoiding dop antagonists  Cont depakote taper  Ultimate goal is to get all sedating meds off and have him only on his home rx.   Acute hypercarbic and hypoxic respiratory failure IMPROVED Restrictive physiology due to severe scoliosis Trach status [facility]) 3/03 >>> changed to 4 cuffless 3/15, capped 3/18 ARDS -resolved Plan:   Cont PMV as much as tolerated May consider decannulation Monday 3/21 if doing well over weekend Speech pathology from yesterday's note demonstrated significant improvement in his dysphagia Cont to Cap trach today and overnight, downgrade to a 4  cuffless.  Hypertension Plan:  Clonidine  Telemetry monitoring  Dysphagia due to trach status Difficult NGT placement Constipation >passed MBS 3/16 Plan   SUP: Pantoprazole. Adv diet.  Off  tubefeeds   Will keep cap trach todayif capped without complications til Monday 3/21 then will decannulate.  Dorcas Carrow Beeper  [phone]  Cell  [phone]  If no response or cell goes to voicemail, call beeper [phone] 8:55 AM 04/24/2014

## 2014-04-25 LAB — GLUCOSE, CAPILLARY
Glucose-Capillary: 122 mg/dL — ABNORMAL HIGH (ref 70–99)
Glucose-Capillary: 140 mg/dL — ABNORMAL HIGH (ref 70–99)

## 2014-04-25 MED ORDER — CLONAZEPAM 0.5 MG PO TABS
0.2500 mg | ORAL_TABLET | Freq: Every day | ORAL | Status: DC
  Administered 2014-04-25 – 2014-04-27 (×3): 0.25 mg
  Filled 2014-04-25 (×3): qty 1

## 2014-04-25 NOTE — Progress Notes (Signed)
Patient ambulated in hallway with walker x3 rounds. Started walk on room air and patient sats went down to 82% sustaining. [place] 2L initiated and finished walk. Patient sats 97% while walking with the oxygen. He did very well. Slight dyspnea towards end of the walk but patient tolerated well. Comfortable and resting at this time. Will continue to monitor.   M.Foster SimpsonPiel, RN

## 2014-04-25 NOTE — Progress Notes (Signed)
PULMONARY / CRITICAL CARE MEDICINE   Name: [name] MRN: [id number] DOB: 06/26/1990    ADMISSION DATE:  03/31/2014 CONSULTATION DATE:  04/25/2014  REFERRING MD :  EDP  CHIEF COMPLAINT:  SOB  INITIAL PRESENTATION:  23 [facility] with SOB &  hypoxic with sats in 60's.  He was placed on BiPAP but repeat ABG 3 hours later showed progressive respiratory decline.  Given his hx of cervical fusion, micrognathia, prior trach, etc. (which all predispose him to difficult airway), he was intubated prior to ICU admission.  STUDIES:  CTA chest 2/24 >>> negative for PE, limited study due to motion.  SIGNIFICANT EVENTS: 2/24 Admit 2/27 Awake, follows commands on fent / precedex  2/28 Remains on PRVC, agitation / sedation issues.  Weaned briefly on 15/5 3/02 Trach placement [facility]([place]) 3/04 Lots of agitation. Bedside sitter ordered. PT ordered. Tolerated PS 20 cm H2O 3/8 Severe agitation despite frequent sedation PRN push.  3/11 Gastrostomy per IR   SUBJECTIVE:  Awake & interactive On RA, trach capped , no issues overnight   VITAL SIGNS: ON RA, trach capped  Temp:  [97.5 F (36.4 C)-98.5 F (36.9 C)] 98.3 F (36.8 C) (03/20 0423) Pulse Rate:  [74-113] 99 (03/20 0737) Resp:  [20-37] 26 (03/20 0737) BP: (129-171)/(64-100) 132/84 mmHg (03/20 0423) SpO2:  [93 %-100 %] 99 % (03/20 0737) Weight:  [64.456 kg (142 lb 1.6 oz)] 64.456 kg (142 lb 1.6 oz) (03/20 0700)       INTAKE / OUTPUT: Intake/Output      03/19 0701 - 03/20 0700 03/20 0701 - 03/21 0700   P.O. 120    Other 100    Total Intake(mL/kg) 220 (3.4)    Urine (mL/kg/hr) 250 (0.2)    Stool 1 (0)    Total Output 251     Net -31          Urine Occurrence 3 x    Stool Occurrence 2 x     PHYSICAL EXAMINATION: General: young male with trach  NAD Neuro: resolved agitation, moves all ext equal, cooperative  HEENT:  NAD, tracheostomy site intact, # 4 strength and phonation improving daily , PMV in place   Cardiovascular: s1 s2 Reg, no M.  Lungs: anterior reduced, barrel chest, no accessory muscle use  Abdomen:mildly distended no sign tenderness/ rebound/ bs diminished  Ext: R club foot, R hemiatrophy, no edema, no changes  LABS: Labs ordered/ reviewed   Recent Labs Lab 04/20/14 0320 04/24/14 0306  NA 142 141  K 5.8* 4.1  CL 96 102  CO2 35* 30  BUN 16 6  CREATININE 0.79 0.78  GLUCOSE 113* 149*    Recent Labs Lab 04/20/14 0320 04/24/14 0306  HGB 13.2 12.2*  HCT 44.2 40.5  WBC 5.0 9.2  PLT 164 275    Impression/plan Principal Problem:   Acute respiratory failure with hypoxia Active Problems:   Respiratory failure with hypoxia   Restrictive lung disease   Acute respiratory failure   Encounter for orogastric (OG) tube placement   History of ETT   Shortness of breath   Respiratory abnormalities   Pneumonia   Dysphagia   Encounter for feeding tube placement   ARDS (adult respiratory distress syndrome)   Hx Insomnia, Tourette's syndrome, tic disorder, scoliosis s/p surgical fixation, right hemiatrophy, 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  Discussed with pharmacy. Resolved off Haldol. Delirium, resolved  All above resolved.   Plan:   Cont clonazepam-->continue to taper  Avoiding dop antagonists  Cont depakote taper  Ultimate goal is to get all sedating meds off and have him only on his home rx.   Acute hypercarbic and hypoxic respiratory failure IMPROVED Restrictive physiology due to severe scoliosis Trach status [facility]) 3/03 >>> changed to 4 cuffless 3/15, capped 3/18 ARDS -resolved Plan:   Cont PMV as much as tolerated probable decannulation Monday 3/21  Speech pathology from yesterday's note demonstrated significant improvement in his dysphagia   Hypertension Plan:  Clonidine  Telemetry monitoring  Dysphagia due to  trach status Difficult NGT placement Constipation >passed MBS 3/16 Plan   SUP: Pantoprazole. Adv diet.  Off  tubefeeds   Will keep cap trach todayif capped without complications til Monday 3/21 then will decannulate.  Dorcas Carrow Beeper  [phone]  Cell  [phone]  If no response or cell goes to voicemail, call beeper [phone] 8:45 AM 04/25/2014

## 2014-04-26 LAB — GLUCOSE, CAPILLARY: GLUCOSE-CAPILLARY: 137 mg/dL — AB (ref 70–99)

## 2014-04-26 NOTE — Progress Notes (Signed)
Ambulated in hall x 3 thus far this shift. Noted that during ambulation appears to desat in the 80's. No distress noted. Pt back in room and sat's return to normal on room air. Family at besdside.

## 2014-04-26 NOTE — Progress Notes (Signed)
Pt decannulated by Resp. Grandmother at the bedside.

## 2014-04-26 NOTE — Progress Notes (Signed)
Speech Language Pathology Treatment: Dysphagia  Patient Details Name: [name] MRN: [id number] DOB: 12/22/1990 Today's Date: 04/26/2014 Time: [id number] SLP Time Calculation (min) (ACUTE ONLY): 13 min  Assessment / Plan / Recommendation Clinical Impression  Pt was decannulated today. PO trials with thin liquids and solid consistencies did not elicit s/s of aspiration, however increased risk during next couple of days following decannulation. SLP educated pt regarding stoma care once dressing is removed. Reminded pt to always wash hands prior to touching area to avoid irritation or infection. Recommend pt continue regular/ thin liquid diet. Speech will sign-off   HPI HPI: 24 y.o. [facility] with SOB & hypoxic with sats in 60's. He was placed on BiPAP but repeat ABG 3 hours later showed progressive respiratory decline. Given his hx of cervical fusion, micrognathia, prior trach, etc. (which all predispose him to difficult airway), he was intubated prior to ICU admission. Trach placed on 3/2 by ENT. 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 Prior to admission pt eating and drinking WNL, speech WNL other than cogntiive deficits.    Pertinent Vitals Pain Assessment: No/denies pain  SLP Plan  Continue with current plan of care    Recommendations Diet recommendations: Regular;Thin liquid Liquids provided via: Cup;Straw Medication Administration: Whole meds with liquid Supervision: Patient able to self feed Compensations: Slow rate;Small sips/bites Postural Changes and/or Swallow Maneuvers: Seated upright 90 degrees              Oral Care Recommendations: Oral care BID Follow up Recommendations: Home health SLP Plan: Continue with current plan of care    GO     [name], [name] 04/26/2014, 2:33 PM

## 2014-04-26 NOTE — Progress Notes (Signed)
PULMONARY / CRITICAL CARE MEDICINE   Name: [name] MRN: [id number] DOB: 07/03/1990    ADMISSION DATE:  03/31/2014 CONSULTATION DATE:  04/26/2014  REFERRING MD :  EDP  CHIEF COMPLAINT:  SOB  INITIAL PRESENTATION:  23 yob[facility] with SOB &  hypoxic with sats in 60's.  He was placed on BiPAP but repeat ABG 3 hours later showed progressive respiratory decline.  Given his hx of cervical fusion, micrognathia, prior trach, etc. (which all predispose him to difficult airway), he was intubated prior to ICU admission.  STUDIES:  CTA chest 2/24 >>> negative for PE, limited study due to motion.  SIGNIFICANT EVENTS: 2/24 Admit 2/27 Awake, follows commands on fent / precedex  2/28 Remains on PRVC, agitation / sedation issues.  Weaned briefly on 15/5 3/02 Trach placement [facility]([place]) 3/04 Lots of agitation. Bedside sitter ordered. PT ordered. Tolerated PS 20 cm H2O 3/8 Severe agitation despite frequent sedation PRN push.  3/11 Gastrostomy per IR  04/25/14: Awake & interactive. On RA, trach capped , no issues overnight   SUBJECTIVE/OVERNIGHT/INTERVAL HX 04/26/14: Sitting and talking. He wants decannulation. RNs says he walked around unit.    VITAL SIGNS: ON RA, trach capped  Temp:  [97.3 F (36.3 C)-98.4 F (36.9 C)] 98.4 F (36.9 C) (03/21 0803) Pulse Rate:  [77-117] 109 (03/21 0803) Resp:  [21-37] 25 (03/21 0803) BP: (72-137)/(57-94) 124/68 mmHg (03/21 0803) SpO2:  [90 %-100 %] 95 % (03/21 0803) FiO2 (%):  [21 %] 21 % (03/21 0803) Weight:  [65.8 kg (145 lb 1 oz)] 65.8 kg (145 lb 1 oz) (03/21 0433)       INTAKE / OUTPUT: Intake/Output      03/20 0701 - 03/21 0700 03/21 0701 - 03/22 0700   P.O. 600    Other     Total Intake(mL/kg) 600 (9.1)    Urine (mL/kg/hr) 100 (0.1)    Stool 0 (0)    Total Output 100     Net +500          Urine Occurrence 5 x    Stool Occurrence 1 x     PHYSICAL EXAMINATION: General: young male with trach  NAD Neuro: resolved agitation,  moves all ext equal, cooperative  HEENT:  NAD, tracheostomy site intact, # 4 strength and phonation improving daily , PMV in place  Cardiovascular: s1 s2 Reg, no M.  Lungs: anterior reduced, barrel chest, no accessory muscle use  Abdomen:mildly distended no sign tenderness/ rebound/ bs diminished  Ext: R club foot, R hemiatrophy, no edema, no changes  LABS: Labs ordered/ reviewed   PULMONARY No results for input(s): PHART, PCO2ART, PO2ART, HCO3, TCO2, O2SAT in the last 168 hours.  Invalid input(s): PCO2, PO2  CBC  Recent Labs Lab 04/20/14 0320 04/24/14 0306  HGB 13.2 12.2*  HCT 44.2 40.5  WBC 5.0 9.2  PLT 164 275    COAGULATION No results for input(s): INR in the last 168 hours.  CARDIAC  No results for input(s): TROPONINI in the last 168 hours. No results for input(s): PROBNP in the last 168 hours.   CHEMISTRY  Recent Labs Lab 04/20/14 0320 04/24/14 0306  NA 142 141  K 5.8* 4.1  CL 96 102  CO2 35* 30  GLUCOSE 113* 149*  BUN 16 6  CREATININE 0.79 0.78  CALCIUM 8.7 8.5  MG 2.4 1.8  PHOS 3.4 5.3*   Estimated Creatinine Clearance: 108.9 mL/min (by C-G formula based on Cr of 0.78).  LIVER No results for input(s): AST, ALT, ALKPHOS, BILITOT, PROT, ALBUMIN, INR in the last 168 hours.   INFECTIOUS No results for input(s): LATICACIDVEN, PROCALCITON in the last 168 hours.   ENDOCRINE CBG (last 3)   Recent Labs  04/25/14 0932 04/25/14 2134 04/26/14 0913  GLUCAP 122* 140* 137*         IMAGING x48h No results found.     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  Discussed with pharmacy. Resolved off Haldol. Delirium, resolved All above resolved.   Plan:   Cont clonazepam-->continue to taper  Avoiding dop antagonists  Cont depakote taper  Ultimate goal is to get all sedating meds off and have him only on his home rx.   Acute hypercarbic and hypoxic respiratory failure IMPROVED Restrictive physiology due to severe scoliosis Trach status [facility]) 3/03 >>> changed to 4 cuffless 3/15, capped 3/18 ARDS -resolved Plan:   Cont PMV as much as tolerated probable decannulation Monday 3/21  Speech pathology from yesterday's note demonstrated significant improvement in his dysphagia   Hypertension Plan:  Clonidine  Telemetry monitoring  Dysphagia due to trach status Difficult NGT placement Constipation >passed MBS 3/16 Plan   SUP: Pantoprazole. Adv diet.  Off  tubefeeds    Decannulate 04/26/2014 - now RT order given    Dr. Kalman Shan, M.D., [facility].C.P Pulmonary and Critical Care Medicine Staff Physician [facility] Pulmonary and Critical Care Pager: [phone], If no answer or between  15:00h - 7:00h: call [phone]  04/26/2014 11:19 AM

## 2014-04-26 NOTE — Progress Notes (Signed)
RT Note: Patient was decanulated per MD order. Janina Mayorach was removed with no issues. Stoma site appeared to have some reddening and some blood was cleaned from stoma site. Patient tolerated procedure well with no complications. A pressure bandage was placed over stoma site utilizing gauze and pink tape. Patient is able to speak well and is in no apparent respiratory distress. RT will continue to monitor.

## 2014-04-26 NOTE — Progress Notes (Signed)
Physical Therapy Treatment Patient Details Name: Yoshi Kane MRN: [id number] DOB: December 02, 1990 Today's Date: 04/26/2014    History of Present Illness Patient is a 24 yo male admitted 03/31/14 with respiratory failure, hypoxia (in 60's in ED) and was intubated.   Had trach on 04/07/14.  Recent agitation/confusion.  Patient with waist restraint and mitts.  PMH:  Scoliosis, HTN, Tourette's syndrome, Rt hemiatrophy RLE, Rt club foot, cervical fusion.    PT Comments    Patient progressing very well with mobility. Ambulated very significant distance today with one standing rest break. VSS with saturations on room air >90%. Patient verbalizing well throughout session. Will continue to see and progress as tolerated. Goals updated.   Follow Up Recommendations  Supervision/Assistance - 24 hour     Equipment Recommendations       Recommendations for Other Services       Precautions / Restrictions Precautions Precaution Comments: trach Restrictions Weight Bearing Restrictions: No    Mobility  Bed Mobility         Supine to sit: Supervision     General bed mobility comments: Cues to attend to lines  Transfers Overall transfer level: Needs assistance Equipment used: Rolling walker (2 wheeled);None Transfers: Sit to/from Stand Sit to Stand: Supervision         General transfer comment: VCs for attention to lines, able to perform without physical assist from various surfaces  Ambulation/Gait Ambulation/Gait assistance: Supervision Ambulation Distance (Feet): 1600 Feet Assistive device: Rolling walker (2 wheeled) Gait Pattern/deviations: Step-through pattern;Decreased stride length;Trunk flexed Gait velocity: mildly decreased Gait velocity interpretation: Below normal speed for age/gender General Gait Details: one standing rest break >2 minutes, significantly increased ambulation without supplemental O2.    Stairs            Wheelchair Mobility    Modified Rankin  (Stroke Patients Only)       Balance     Sitting balance-Leahy Scale: Good       Standing balance-Leahy Scale: Fair                      Cognition Arousal/Alertness: Awake/alert Behavior During Therapy: WFL for tasks assessed/performed Overall Cognitive Status: Within Functional Limits for tasks assessed                      Exercises      General Comments        Pertinent Vitals/Pain Pain Assessment: No/denies pain    Home Living                      Prior Function            PT Goals (current goals can now be found in the care plan section) Acute Rehab PT Goals PT Goal Formulation: With patient Time For Goal Achievement: 04/25/14 Potential to Achieve Goals: Good Progress towards PT goals: Progressing toward goals    Frequency  Min 3X/week    PT Plan Current plan remains appropriate    Co-evaluation             End of Session Equipment Utilized During Treatment: Gait belt Activity Tolerance: Patient tolerated treatment well Patient left: in chair;with call bell/phone within reach;with chair alarm set     Time: 9562-1308 PT Time Calculation (min) (ACUTE ONLY): 27 min  Charges:  $Gait Training: 23-37 mins                    G  Codes:      Fabio AsaWerner, Keontre Defino J 04/26/2014, 11:07 AM Charlotte Crumbevon Rayon Mcchristian, PT DPT  [phone]

## 2014-04-27 ENCOUNTER — Inpatient Hospital Stay (HOSPITAL_COMMUNITY): Payer: Medicaid Other

## 2014-04-27 LAB — GLUCOSE, CAPILLARY
GLUCOSE-CAPILLARY: 138 mg/dL — AB (ref 70–99)
Glucose-Capillary: 110 mg/dL — ABNORMAL HIGH (ref 70–99)
Glucose-Capillary: 187 mg/dL — ABNORMAL HIGH (ref 70–99)

## 2014-04-27 NOTE — Progress Notes (Signed)
Transfer Note:   Arrival Method: via wheelchair from [facility] Mental Orientation: Alert and oriented x4 Telemetry: N/A Assessment: Completed Skin: Intact, warm, and dry IV: Right Hand 3/22 Normal Saline Locked Pain: Denies 0/10 Tubes: Peg Tube LUQ Clamped Safety Measures: Educated on fall prevention safety plan, patient acknowledged and understood. Admission: Completed [facility] Orientation: Patient has been orientated to the room, unit and staff.  Family: N/A  Orders have been reviewed and implemented. Will continue to monitor the patient. Call light has been placed within reach.   Billy FischerLynn Ryanne Morand, RN  Phone number: [phone]

## 2014-04-27 NOTE — Progress Notes (Signed)
To be transferred to [address];IV team notified to restart IV due to being out of date.

## 2014-04-27 NOTE — Progress Notes (Signed)
SATURATION QUALIFICATIONS: (This note is used to comply with regulatory documentation for home oxygen)  Patient Saturations on Room Air at Rest = 97%  Patient Saturations on Room Air while Ambulating = 87%  Patient Saturations on  2Liters of oxygen while Ambulating = 100%  Please briefly explain why patient needs home oxygen:Pt Desats on RA when ambulating.

## 2014-04-27 NOTE — Progress Notes (Signed)
PULMONARY / CRITICAL CARE MEDICINE   Name: [name] MRN: [id number] DOB: 04/03/1990    ADMISSION DATE:  03/31/2014 CONSULTATION DATE:  04/27/2014  REFERRING MD :  EDP  CHIEF COMPLAINT:  SOB  INITIAL PRESENTATION:  23 yobm brought to [facility] ED 2/24 with SOB &  hypoxic with sats in 60's.  He was placed on BiPAP but repeat ABG 3 hours later showed progressive respiratory decline.  Given his hx of cervical fusion, micrognathia, prior trach, etc. (which all predispose him to difficult airway), he was intubated prior to ICU admission.  STUDIES:  CTA chest 2/24 >>> negative for PE, limited study due to motion.  SIGNIFICANT EVENTS: 2/24 Admit 2/27 Awake, follows commands on fent / precedex  2/28 Remains on PRVC, agitation / sedation issues.  Weaned briefly on 15/5 3/02 Trach placement [facility]) 3/04 Lots of agitation. Bedside sitter ordered. PT ordered. Tolerated PS 20 cm H2O 3/8 Severe agitation despite frequent sedation PRN push.  3/11 Gastrostomy per IR  04/25/14: Awake & interactive. On RA, trach capped , no issues overnight 04/26/14: Sitting and talking. He wants decannulation. RNs says he walked around unit.    SUBJECTIVE/OVERNIGHT/INTERVAL HX 04/27/14: decannulated yesterday. Feels fine. Wants to go home. Eating normally. RN says desats with walking but could be probe issue; patient asymptomatic   VITAL SIGNS: ON RA, trach capped  Temp:  [97.4 F (36.3 C)-98.7 F (37.1 C)] 98.2 F (36.8 C) (03/22 1129) Pulse Rate:  [68-137] 105 (03/22 1133) Resp:  [17-39] 20 (03/22 1133) BP: (115-141)/(69-94) 135/85 mmHg (03/22 0846) SpO2:  [89 %-97 %] 97 % (03/22 1133) FiO2 (%):  [21 %] 21 % (03/22 0308) Weight:  [65.3 kg (143 lb 15.4 oz)] 65.3 kg (143 lb 15.4 oz) (03/22 0239)       INTAKE / OUTPUT: Intake/Output      03/21 0701 - 03/22 0700 03/22 0701 - 03/23 0700   P.O. 840    Total Intake(mL/kg) 840 (12.9)    Urine (mL/kg/hr) 375 (0.2)    Stool     Total Output 375     Net +465            PHYSICAL EXAMINATION: General: young male short, scoliotic, large chest . NEuro: AxOX3. Walked normally HEENT:  NAD, tracheostomy  Site has bandage Cardiovascular: s1 s2 Reg, no M.  Lungs: anterior reduced, barrel chest, no accessory muscle use  Abdomen:mildly distended no sign tenderness/ rebound/ bs diminished  Ext: R club foot, R hemiatrophy, no edema, no changes  LABS: Labs ordered/ reviewed   PULMONARY No results for input(s): PHART, PCO2ART, PO2ART, HCO3, TCO2, O2SAT in the last 168 hours.  Invalid input(s): PCO2, PO2  CBC  Recent Labs Lab 04/24/14 0306  HGB 12.2*  HCT 40.5  WBC 9.2  PLT 275    COAGULATION No results for input(s): INR in the last 168 hours.  CARDIAC  No results for input(s): TROPONINI in the last 168 hours. No results for input(s): PROBNP in the last 168 hours.   CHEMISTRY  Recent Labs Lab 04/24/14 0306  NA 141  K 4.1  CL 102  CO2 30  GLUCOSE 149*  BUN 6  CREATININE 0.78  CALCIUM 8.5  MG 1.8  PHOS 5.3*   Estimated Creatinine Clearance: 108.5 mL/min (by C-G formula based on Cr of 0.78).   LIVER No results for input(s): AST, ALT, ALKPHOS, BILITOT, PROT, ALBUMIN, INR in the last 168 hours.   INFECTIOUS No results for input(s): LATICACIDVEN, PROCALCITON in the last  168 hours.   ENDOCRINE CBG (last 3)   Recent Labs  04/26/14 0913 04/27/14 0749 04/27/14 1128  GLUCAP 137* 110* 187*         IMAGING x48h No results found.     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  Discussed with pharmacy. Resolved off Haldol. Delirium, resolved All above resolved.   Plan:   Cont clonazepam-->continue to taper  Avoiding dop antagonists  Cont depakote taper  Ultimate goal is to get all sedating meds off and have him only on his home rx.   Acute hypercarbic and hypoxic respiratory failure IMPROVED Restrictive physiology due to severe scoliosis Trach status Lazarus Salines([place]) 3/03 >>> changed to 4 cuffless 3/15, capped 3/18 ARDS -resolved Plan:   Cont PMV as much as tolerated probable decannulation Monday 3/21  Speech pathology from yesterday's note demonstrated significant improvement in his dysphagia   Hypertension Plan:  Clonidine  Telemetry monitoring  Dysphagia due to trach status Difficult NGT placement Constipation >passed MBS 3/16 Plan   SUP: Pantoprazole. Adv diet.  Off  tubefeeds    Decannulate 04/26/2014 - doing well. Concern for ambulator desats  Plan  - move to regular floor  - check cxr port - do walking desat with o2 and without o2 but with forehead probe - aim dc 04/28/14   Dr. Kalman ShanMurali Darly Massi, M.D., [facility] Health[facility].C.C.P Pulmonary and Critical Care Medicine Staff Physician [facility] Pager: [phone], If no answer or between  15:00h - 7:00h: call [phone]  04/27/2014 2:51 PM

## 2014-04-28 ENCOUNTER — Encounter: Payer: Self-pay | Admitting: Pulmonary Disease

## 2014-04-28 DIAGNOSIS — J9601 Acute respiratory failure with hypoxia: Secondary | ICD-10-CM

## 2014-04-28 NOTE — Discharge Summary (Signed)
Physician Discharge Summary  Patient ID: [name] MRN: [id number] DOB/AGE: 08-26-1990 24 y.o.  Admit date: 03/31/2014 Discharge date: 04/28/2014    Discharge Diagnoses:  Acute Hypercarbic / Hypoxic Respiratory Failure Restrictive Physiology due to severe scoliosis  Tracheostomy Status Suspected CAP ARDS Tracheostomy Status  Hypertension  Tachycardia  Acute Kidney Injury  Hypernatremia  Constipation  Dysphagia secondary to tracheostomy  PEG Status Acute Metabolic Encephalopathy  Agitation / Delirium  Insomnia Tourette's Syndrome Tic Disorder  Scoliosis s/p Fixation  R Hemiatrophy  R club foot EPS Effect secondary to Haldol                                                                        DISCHARGE PLAN BY DIAGNOSIS     Acute Hypercarbic / Hypoxic Respiratory Failure Restrictive Physiology due to severe scoliosis  Tracheostomy Status - de-cannulated 3/21 Suspected CAP ARDS Tracheostomy Status   Discharge Plan: Assessed for oxygen needs prior to discharge. Patient briefly drops into mid-high 80's with activity.  Mother reports this is his baseline.  Saturations quickly recover to 96-98% at rest.  No home oxygen at discharge.  Trach site care daily and PRN, cover with band aid, clean with mild soap and water / dry.   Ok to resume activity as tolerated, may return to school (note provided)  Hypertension  Tachycardia   Discharge Plan: Resume home clonidine QHS (sleep / HTN) Follow up with PCP in one week  Acute Kidney Injury  Hypernatremia   Discharge Plan: Resolved, no acute follow up at discharge.    Constipation  Dysphagia secondary to tracheostomy  PEG Status   Discharge Plan: Patient to arrange follow up with IR post discharge for PEG removal (can not be scheduled until patient is discharged) Diet as tolerated    Acute Metabolic Encephalopathy  Agitation / Delirium  Insomnia Tourette's Syndrome Tic Disorder  Scoliosis s/p Fixation  R  Hemiatrophy  R club foot EPS Effect secondary to Haldol   Discharge Plan: Resume prior home medications as below, clarified with Mother prior to discharge Follow up with Dr. Gaynell Face as scheduled.  Discontinue valproic acid (used for delirium / agitation) Discontinue further klonopin                  DISCHARGE SUMMARY   [name] is a 24 y.o. y/o male with a PMH of club foot, hemiatrophy of RLE, Klippel Feil Syndrome, scoliosis s/p harrington rod surgery (2003), Restrictive lung disease, & Tourette's syndrome / Tic disorder who presented to [facility] on 03/31/14 with a 3-4 day history of increasing shortness of breath and difficulty sleeping. At baseline, he attends college level courses and is functional / independent of ADL's.   ER course was notable for increasing dyspnea / respiratory distress.  He was placed on bipap but had worsening hypercarbia and decision was made for intubation.  Due to patients history (underlying medical problems including multiple musculoskeletal deformities, micrognathia, and hx of tracheostomy as a child) and rapid decline in ER, the patient was electively intubated in the ER (2 attempts, glidescope #3, 7.5 ETT placed).  The patient was admitted to ICU for further work up.  CT of the chest was obtained which was negative for PE but  demonstrated patchy atelectasis in lingula and LLL.  Empiric antibiotics were completed for suspected community acquired pneumonia.  He was sedated, diuresed and home medications held during ICU course.  ICU stay was significantly prolonged due to agitation / delirium, development of ARDS, Acute Kidney Injury and constipation.  The patient did not tolerate Haldol due to EPS effects (resolved once off medication).  He failed early mechanical ventilation weaning efforts.  Ultimately, a tracheostomy was placed 08/03/34 per Dr. Erik Obey.  Further, a PEG was placed per Interventional Radiology on 04/16/14.  He was pan cultured and all  were negative.  The patient made slow progress with weaning but was transitioned slowly to ATC.  He further tolerated trials of tracheostomy capping on 3/20 and decannulation on 3/21.  He was ambulated prior to discharge to assess for oxygen needs and did have brief desaturations with activity that recovered with rest.  Mother reported that this is his baseline.  Medically cleared for discharge with plans as above.                 STUDIES:  CTA chest 2/24 >>> negative for PE, limited study due to motion.  SIGNIFICANT EVENTS: 2/24  Admit  2/27  Awake, follows commands on fent / precedex  2/28  Remains on PRVC, agitation / sedation issues. Weaned briefly on 15/5 3/02  Trach placement [facility]) 6/29  Lots of agitation. Bedside sitter ordered. PT ordered. Tolerated PS 20 cm H2O 3/08  Severe agitation despite frequent sedation PRN push.  3/11  Gastrostomy per IR  3/20  Awake & interactive. On RA, trach capped , no issues overnight 3/21  Sitting and talking. He wants decannulation. RNs says he walked around unit.  3/22  Tolerating decannulation, eating normally, desats with walking but not symptomatic  MICRO DATA  2/24  MRSA PCR >> neg 2/25  Tracheal aspirate >> neg  2/24  BCx2 >> negative 3/09  Sputum >> negative   CONSULTS Interventional Radiology - PEG placement  Speech Pathology  Physical Therapy   TUBES / LINES OETT 2/24 >> 4/76 [facility]) 5/46 >> 5/03     Discharge Exam: General: young male short, scoliotic, large chest Neuro: AxOX3. Walked normally HEENT: NAD, tracheostomy site clean /dry, bandages changed - small amt yellow drainage at site / cleaned Cardiovascular: s1 s2 Reg, no M.  Lungs: anterior reduced, barrel chest, no accessory muscle use  Abdomen: abd soft, bsx4 active Ext: R club foot, R hemiatrophy, no edema, no changes   Filed Vitals:   04/27/14 2000 04/27/14 2123 04/28/14 0500 04/28/14 0942  BP: 133/86 142/82 136/85 140/75  Pulse: 118 98  66 95  Temp: 98.1 F (36.7 C) 98.6 F (37 C) 98.3 F (36.8 C) 98.6 F (37 C)  TempSrc: Oral Oral Oral Oral  Resp: _0 Height:  _1  (1.473 m)    Weight:  143 lb 14.4 oz (65.273 kg)    SpO2: 96% 95% 96% 96%     Discharge Labs  BMET  Recent Labs Lab 04/24/14 0306  NA 141  K 4.1  CL 102  CO2 30  GLUCOSE 149*  BUN 6  CREATININE 0.78  CALCIUM 8.5  MG 1.8  PHOS 5.3*   CBC  Recent Labs Lab 04/24/14 0306  HGB 12.2*  HCT 40.5  WBC 9.2  PLT 275      Discharge Instructions    Call MD for:  difficulty breathing, headache or visual disturbances    Complete by:  As directed      Call MD for:  extreme fatigue    Complete by:  As directed      Call MD for:  hives    Complete by:  As directed      Call MD for:  persistant dizziness or light-headedness    Complete by:  As directed      Call MD for:  persistant nausea and vomiting    Complete by:  As directed      Call MD for:  redness, tenderness, or signs of infection (pain, swelling, redness, odor or green/yellow discharge around incision site)    Complete by:  As directed      Call MD for:  severe uncontrolled pain    Complete by:  As directed      Call MD for:  temperature >100.4    Complete by:  As directed      Diet general    Complete by:  As directed      Discharge instructions    Complete by:  As directed   1.  Review your medications carefully.   2.     Discharge wound care:    Complete by:  As directed   1.  Keep trach site clean and dry.  Ok to clean with mild soap and water.  Dry site and apply band aid to cover site until it has closed.  It is normal for the trach site to have secretions / mucus.  Monitor for signs of infection - redness, warmth, pus etc. 2.  Resume previous activity as tolerated. 3.  Clean PEG tube site as needed with gentle soap and water.  Dry site.  OK to tape tube to abdomen if needed to prevent pulling / for comfort.  Please call interventional radiology to make an  appointment for PEG tube removal.     Increase activity slowly    Complete by:  As directed                 Follow-up Information    Follow up with [facility], MD.   Specialty:  Family Medicine   Why:  Call for an appointment with your primary care doctor to be seen in one week.       Follow up with Jodi Geralds, MD. Schedule an appointment as soon as possible for a visit on 05/28/2014.   Specialty:  Pediatrics   Why:  Appt at 11:45 AM    Contact information:   [address] [place] [address] [phone]       Follow up with [facility], MD.   Specialty:  Interventional Radiology   Why:  Call for appointment post discharge to be seen in IR PEG clinic for possible feeding tube removal.   Contact information:   [address] [place] [address] [phone]          Medication List    STOP taking these medications        amphetamine-dextroamphetamine 10 MG tablet  Commonly known as:  ADDERALL      TAKE these medications        cloNIDine 0.1 MG tablet  Commonly known as:  CATAPRES  TAKE 3 TABLETS BY MOUTH AT BEDTIME     Dexmethylphenidate HCl 35 MG Cp24  Take 1 capsule every morning          Disposition: Home.  No new home needs identified at time of discharge.  Discharged Condition: [name] has met maximum benefit of inpatient care and is medically stable and cleared for discharge.  Patient is pending follow up as above.      Time spent on disposition:  Greater than 35 minutes.   Signed: Noe Gens, NP-C [facility] [facility] Pgr: [phone] Office: [place], [place] 04/28/2014, 6:07 PM Pager:  [phone] After 3pm call: [phone]

## 2014-04-28 NOTE — Progress Notes (Signed)
Physical Therapy Treatment Patient Details Name: [name] MRN: [id number] DOB: 05/06/1990 Today's Date: 04/28/2014    History of Present Illness Patient is a 24 yo male admitted 03/31/14 with respiratory failure, hypoxia (in 60's in ED) and was intubated.   Had trach on 04/07/14.  Recent agitation/confusion.  Patient with waist restraint and mitts.  PMH:  Scoliosis, HTN, Tourette's syndrome, Rt hemiatrophy RLE, Rt club foot, cervical fusion.    PT Comments    Pt progressing towards physical therapy goals. Was able to mobilize well with no physical assist required, however O2 sats decreased significantly on RA - RN aware. See gait details for specifics. Will continue to follow acutely to mobilize and monitor O2 sats on RA during exercise.   Follow Up Recommendations  No PT follow up     Equipment Recommendations  None recommended by PT    Recommendations for Other Services       Precautions / Restrictions Precautions Precautions: Fall Precaution Comments: Watch O2 sats Restrictions Weight Bearing Restrictions: No    Mobility  Bed Mobility               General bed mobility comments: Pt standing at sink when PT arrived.  Transfers Overall transfer level: Needs assistance Equipment used: Rolling walker (2 wheeled);None Transfers: Sit to/from Stand Sit to Stand: Modified independent (Device/Increase time)         General transfer comment: Increased time. No unsteadiness noted.  Ambulation/Gait Ambulation/Gait assistance: Modified independent (Device/Increase time) Ambulation Distance (Feet): 600 Feet Assistive device: None Gait Pattern/deviations: Step-through pattern;Decreased stride length;Trendelenburg Gait velocity: mildly decreased Gait velocity interpretation: Below normal speed for age/gender General Gait Details: O2 sats decreased as low as 68% throughout gait training. HR increased to high 130's. Pt reports no SOB, lightheadedness, or any other symptoms  during this time. With seated rest break sats increased to 90's within 2 minutes.   Stairs Stairs: Yes Stairs assistance: Supervision Stair Management: One rail Right;Alternating pattern;Forwards Number of Stairs: 8 General stair comments: No physical assist required.   Wheelchair Mobility    Modified Rankin (Stroke Patients Only)       Balance Overall balance assessment: Needs assistance Sitting-balance support: Feet supported;No upper extremity supported Sitting balance-Leahy Scale: Good       Standing balance-Leahy Scale: Fair                      Cognition Arousal/Alertness: Awake/alert Behavior During Therapy: WFL for tasks assessed/performed Overall Cognitive Status: No family/caregiver present to determine baseline cognitive functioning                      Exercises      General Comments        Pertinent Vitals/Pain Pain Assessment: No/denies pain    Home Living                      Prior Function            PT Goals (current goals can now be found in the care plan section) Acute Rehab PT Goals Patient Stated Goal: None stated PT Goal Formulation: With patient Time For Goal Achievement: 04/25/14 Potential to Achieve Goals: Good Progress towards PT goals: Progressing toward goals    Frequency  Min 3X/week    PT Plan Current plan remains appropriate    Co-evaluation             End of Session Equipment Utilized During Treatment:  Gait belt Activity Tolerance: Patient tolerated treatment well Patient left: in chair;with call bell/phone within reach;with chair alarm set     Time: 1000-1017 PT Time Calculation (min) (ACUTE ONLY): 17 min  Charges:  $Gait Training: 8-22 mins                    G Codes:      Conni Slipper 05-25-14, 12:51 PM   Conni Slipper, PT, DPT Acute Rehabilitation Services Pager: [phone]

## 2014-04-28 NOTE — Progress Notes (Signed)
Patient Discharge:  Disposition: Pt discharged home with mother  Education: Pt and mother educated on follow up appointments, medications, PEG tube care, and trach site care. Mother verbalized understanding.   IV: Reomoved  Telemetry: N/A  Follow-up appointments:Reviewed with pt's mother  Prescriptions: N/A  Transportation: Transported home with mother  Belongings:All belongings taken with pt.

## 2014-04-28 NOTE — Progress Notes (Signed)
NUTRITION FOLLOW-UP  INTERVENTION: Encourage adequate PO intake.  NUTRITION DIAGNOSIS: Inadequate oral intake related to inability to eat as evidenced by NPO status; resolved.  Unintentional weight loss related to acute illness as evidenced by 4% weight loss in 1 month.  Goal: Pt to meet >/= 90% of estimated energy requirements; met  Monitor:  PO intake, weight, labs  ASSESSMENT:  24 y/o male brought to [facility] ED with SOB. He was intubated prior to ICU admission. PMH of Tourette's, Tic disorder, Scoliosis, hemiatrophy of right leg, and Club foot. Trach and PEG placed. Pt extubated, passed MBS 3/16. Diet advanced. Trach removed 3/21.   Pt reports having a good appetite with no other difficulties. Meal completion has been 100%. Pt was encouraged to eat his food at meals.         Labs and medications reviewed.  Height: Ht Readings from Last 1 Encounters:  04/27/14 _0  (1.473 m)    Weight: Wt Readings from Last 1 Encounters:  04/27/14 143 lb 14.4 oz (65.273 kg)  04/01/14 149 lb  BMI:  Body mass index is 30.08 kg/(m^2).   Estimated Nutritional Needs: Kcal: 1700-1900 Protein: 80-95 grams Fluid: >/= 1.7 L daily  Skin: closed incision on neck, generalized edema  Diet Order: Diet regular  Intake/Output Summary (Last 24 hours) at 04/28/14 1254 Last data filed at 04/28/14 0939  Gross per 24 hour  Intake    360 ml  Output    350 ml  Net     10 ml    Last BM: 3/22  Labs:   Recent Labs Lab 04/24/14 0306  NA 141  K 4.1  CL 102  CO2 30  BUN 6  CREATININE 0.78  CALCIUM 8.5  MG 1.8  PHOS 5.3*  GLUCOSE 149*    CBG (last 3)   Recent Labs  04/27/14 0749 04/27/14 1128 04/27/14 1548  GLUCAP 110* 187* 138*    Scheduled Meds: . clonazePAM  0.25 mg Per Tube QHS  . cloNIDine  0.1 mg Per Tube QHS  . enoxaparin (LOVENOX) injection  40 mg Subcutaneous Q24H  . LORazepam  1 mg Intravenous Once  . polyethylene glycol  17 g Oral Daily  . Valproic Acid  125 mg Per  Tube QHS    Kallie Locks, MS, RD, LDN Pager # [phone] After hours/ weekend pager # [phone]

## 2014-04-29 ENCOUNTER — Other Ambulatory Visit: Payer: Self-pay | Admitting: Family

## 2014-05-03 ENCOUNTER — Other Ambulatory Visit: Payer: Self-pay | Admitting: Family

## 2014-05-03 DIAGNOSIS — F988 Other specified behavioral and emotional disorders with onset usually occurring in childhood and adolescence: Secondary | ICD-10-CM

## 2014-05-03 MED ORDER — DEXMETHYLPHENIDATE HCL ER 35 MG PO CP24: ORAL_CAPSULE | ORAL | Status: AC

## 2014-05-04 ENCOUNTER — Other Ambulatory Visit (HOSPITAL_COMMUNITY): Payer: Self-pay | Admitting: Interventional Radiology

## 2014-05-04 DIAGNOSIS — R633 Feeding difficulties, unspecified: Secondary | ICD-10-CM

## 2014-05-08 ENCOUNTER — Emergency Department (HOSPITAL_COMMUNITY): Payer: Medicaid Other

## 2014-05-08 ENCOUNTER — Emergency Department (HOSPITAL_COMMUNITY)
Admission: EM | Admit: 2014-05-08 | Discharge: 2014-05-09 | Disposition: A | Discharge status: Home / Self Care | Payer: Medicaid Other | Attending: Emergency Medicine | Admitting: Emergency Medicine

## 2014-05-08 ENCOUNTER — Encounter (HOSPITAL_COMMUNITY): Payer: Self-pay | Admitting: *Deleted

## 2014-05-08 DIAGNOSIS — R000 Tachycardia, unspecified: Secondary | ICD-10-CM

## 2014-05-08 DIAGNOSIS — M419 Scoliosis, unspecified: Secondary | ICD-10-CM | POA: Diagnosis not present

## 2014-05-08 DIAGNOSIS — R0602 Shortness of breath: Secondary | ICD-10-CM | POA: Insufficient documentation

## 2014-05-08 DIAGNOSIS — R05 Cough: Secondary | ICD-10-CM | POA: Diagnosis not present

## 2014-05-08 DIAGNOSIS — Q6689 Other  specified congenital deformities of feet: Secondary | ICD-10-CM | POA: Diagnosis not present

## 2014-05-08 LAB — CBC
HCT: 46.5 % (ref 39.0–52.0)
Hemoglobin: 14.3 g/dL (ref 13.0–17.0)
MCH: 29.7 pg (ref 26.0–34.0)
MCHC: 30.8 g/dL (ref 30.0–36.0)
MCV: 96.5 fL (ref 78.0–100.0)
Platelets: 173 10*3/uL (ref 150–400)
RBC: 4.82 MIL/uL (ref 4.22–5.81)
RDW: 14.5 % (ref 11.5–15.5)
WBC: 7.1 10*3/uL (ref 4.0–10.5)

## 2014-05-08 LAB — BASIC METABOLIC PANEL
ANION GAP: 13 (ref 5–15)
CALCIUM: 9.3 mg/dL (ref 8.4–10.5)
CHLORIDE: 102 mmol/L (ref 96–112)
CO2: 27 mmol/L (ref 19–32)
CREATININE: 0.9 mg/dL (ref 0.50–1.35)
GFR calc Af Amer: >90 mL/min (ref >90)
GFR calc non Af Amer: >90 mL/min (ref >90)
Glucose, Bld: 169 mg/dL — ABNORMAL HIGH (ref 70–99)
Potassium: 2.7 mmol/L — CL (ref 3.5–5.1)
SODIUM: 142 mmol/L (ref 135–145)

## 2014-05-08 LAB — D-DIMER, QUANTITATIVE (NOT AT ARMC): D DIMER QUANT: 1.17 ug{FEU}/mL — AB (ref 0.00–0.48)

## 2014-05-08 LAB — BRAIN NATRIURETIC PEPTIDE: B Natriuretic Peptide: 87 pg/mL (ref 0.0–100.0)

## 2014-05-08 LAB — I-STAT TROPONIN, ED: Troponin i, poc: 0 ng/mL (ref 0.00–0.08)

## 2014-05-08 MED ORDER — POTASSIUM CHLORIDE CRYS ER 20 MEQ PO TBCR: 20.0000 meq | EXTENDED_RELEASE_TABLET | Freq: Once | ORAL | Status: DC

## 2014-05-08 MED ORDER — POTASSIUM CHLORIDE 20 MEQ/15ML (10%) PO SOLN
20.0000 meq | Freq: Once | ORAL | Status: AC
  Administered 2014-05-08: 20 meq via ORAL
  Filled 2014-05-08: qty 15

## 2014-05-08 MED ORDER — IOHEXOL 350 MG/ML SOLN
75.0000 mL | Freq: Once | INTRAVENOUS | Status: AC | PRN
  Administered 2014-05-08: 75 mL via INTRAVENOUS

## 2014-05-08 MED ORDER — LORAZEPAM 1 MG PO TABS
0.5000 mg | ORAL_TABLET | Freq: Once | ORAL | Status: AC
  Administered 2014-05-08: 0.5 mg via ORAL
  Filled 2014-05-08: qty 1

## 2014-05-08 MED ORDER — SODIUM CHLORIDE 0.9 % IV BOLUS (SEPSIS)
1000.0000 mL | Freq: Once | INTRAVENOUS | Status: AC
  Administered 2014-05-08: 1000 mL via INTRAVENOUS

## 2014-05-08 MED ORDER — POTASSIUM CHLORIDE CRYS ER 20 MEQ PO TBCR
40.0000 meq | EXTENDED_RELEASE_TABLET | Freq: Once | ORAL | Status: AC
  Administered 2014-05-08: 40 meq via ORAL
  Filled 2014-05-08: qty 2

## 2014-05-08 NOTE — ED Notes (Signed)
Third RN attempted IV access for CTA, unsuccessful.

## 2014-05-08 NOTE — ED Provider Notes (Signed)
CSN: [id number]     Arrival date & time 05/08/14  1419 History   First MD Initiated Contact with Patient 05/08/14 1503     Chief Complaint  Patient presents with  . Shortness of Breath     (Consider location/radiation/quality/duration/timing/severity/associated sxs/prior Treatment) Patient is a 24 y.o. male presenting with shortness of breath. The history is provided by the patient and a relative (and the aunt).  Shortness of Breath Severity:  Moderate Onset quality:  Gradual Timing:  Constant Progression:  Worsening Chronicity:  Recurrent Relieved by:  None tried Worsened by:  Nothing tried Ineffective treatments:  None tried Associated symptoms: cough   Associated symptoms: no fever, no sputum production and no wheezing     Past Medical History  Diagnosis Date  . Tourette's syndrome   . Tic disorder   . Scoliosis   . Hemiatrophy of right leg   . Club foot     Right   Past Surgical History  Procedure Laterality Date  . Foot surgery Right 2002  . Eye muscle surgery Bilateral 1998 and 2002  . Femoral hernia repair      Done when he was an infant  . Harrington rod surgery  2003    For Scoliosis  . Tracheostomy tube placement N/A 04/07/2014    Procedure: TRACHEOSTOMY;  Surgeon: Flo ShanksKarol Wolicki, MD;  [facility];  Service: ENT;  Laterality: N/A;   Family History  Problem Relation Age of Onset  . Breast cancer Maternal Grandmother     Died in her mid 6570's  . Prostate cancer Maternal Grandfather     Died in his 580's  . Hypertension     History  Substance Use Topics  . Smoking status: Never Smoker   . Smokeless tobacco: Never Used  . Alcohol Use: No    Review of Systems  Constitutional: Negative for fever.  Respiratory: Positive for cough and shortness of breath. Negative for sputum production and wheezing.   All other systems reviewed and are negative.     Allergies  Review of patient's allergies indicates no known allergies.  Home Medications   Prior  to Admission medications   Medication Sig Start Date End Date Taking? Authorizing Provider  cloNIDine (CATAPRES) 0.1 MG tablet TAKE 3 TABLETS BY MOUTH AT BEDTIME 04/29/14  Yes Deetta PerlaWilliam H Hickling, MD  Dexmethylphenidate HCl 35 MG CP24 Take 1 capsule every morning 05/03/14  Yes Princella Ionina P Goodpasture, NP  cloNIDine (CATAPRES) 0.1 MG tablet TAKE 3 TABLETS BY MOUTH AT BEDTIME Patient not taking: Reported on 05/08/2014 03/11/14   Princella Ionina P Goodpasture, NP   BP 131/82 mmHg  Pulse 133  Temp(Src) 98.4 F (36.9 C) (Oral)  Resp 18  SpO2 98% Physical Exam  Constitutional: He is oriented to person, place, and time. He appears well-developed and well-nourished. No distress.  Small stature. Well appearing. Cooperative and speaking without respiratory distress.  HENT:  Head: Normocephalic and atraumatic.  Mouth/Throat: Oropharynx is clear and moist. No oropharyngeal exudate.  Eyes: Conjunctivae and EOM are normal. Pupils are equal, round, and reactive to light.  Neck: Normal range of motion. Neck supple.  Trach scar well healed  Cardiovascular: Regular rhythm, normal heart sounds and intact distal pulses.  Tachycardia present.  Exam reveals no gallop and no friction rub.   No murmur heard. Pulmonary/Chest: Effort normal and breath sounds normal. No respiratory distress. He has no wheezes. He has no rales.  Speaking in full sentences. No respiratory distress. Lungs are clear to auscultation bilaterally. Oxygen  saturation 94% on room air.  Abdominal: Soft. He exhibits no distension and no mass. There is no tenderness. There is no rebound and no guarding.  Musculoskeletal: Normal range of motion. He exhibits no edema or tenderness.  Lymphadenopathy:    He has no cervical adenopathy.  Neurological: He is alert and oriented to person, place, and time. No cranial nerve deficit.  Skin: Skin is warm and dry. No rash noted. He is not diaphoretic.  Psychiatric: He has a normal mood and affect. His behavior is normal.  Judgment and thought content normal.  Nursing note and vitals reviewed.   ED Course  Procedures (including critical care time) Labs Review Labs Reviewed  BASIC METABOLIC PANEL - Abnormal; Notable for the following:    Potassium 2.7 (*)    Glucose, Bld 169 (*)    BUN <5 (*)    All other components within normal limits  D-DIMER, QUANTITATIVE - Abnormal; Notable for the following:    D-Dimer, Quant 1.17 (*)    All other components within normal limits  CBC  BRAIN NATRIURETIC PEPTIDE  TSH  MAGNESIUM  I-STAT TROPOININ, ED    Imaging Review Dg Chest 2 View  05/08/2014   CLINICAL DATA:  Shortness of Breath  EXAM: CHEST  2 VIEW  COMPARISON:  04/27/2014  FINDINGS: Chronic changes in bony thorax are again noted as well as diffuse posterior fixation of the spine. Cardiac shadow remains enlarged. The lungs are clear bilaterally.  IMPRESSION: No acute abnormality noted.   Electronically Signed   By: Alcide Clever M.D.   On: 05/08/2014 15:27   Ct Angio Chest Pe W/cm &/or Wo Cm  05/08/2014   CLINICAL DATA:  Acute onset of shortness of breath. Initial encounter.  EXAM: CT ANGIOGRAPHY CHEST WITH CONTRAST  TECHNIQUE: Multidetector CT imaging of the chest was performed using the standard protocol during bolus administration of intravenous contrast. Multiplanar CT image reconstructions and MIPs were obtained to evaluate the vascular anatomy.  CONTRAST:  75mL OMNIPAQUE IOHEXOL 350 MG/ML SOLN  COMPARISON:  Chest radiograph performed earlier today at 3:14 p.m., and CTA of the chest performed 03/31/2014  FINDINGS: There is no evidence of central pulmonary embolus.  Mild bibasilar atelectasis or scarring is noted. There is prominence of the pulmonary vasculature. The lungs are not well assessed due to motion artifact. A nodular opacity at the posterior right lung base is thought to reflect a vessel. There is no evidence of significant focal consolidation, pleural effusion or pneumothorax. No masses are identified;  no abnormal focal contrast enhancement is seen.  The heart is significantly enlarged. No definite mediastinal lymphadenopathy is seen. No pericardial effusion is identified. The great vessels are grossly unremarkable in appearance. No axillary lymphadenopathy is seen. The visualized portions of the thyroid gland are unremarkable in appearance.  The visualized portions of the liver and spleen are unremarkable. The visualized portions of the pancreas, gallbladder, stomach, adrenal glands and kidneys are within normal limits.  No acute osseous abnormalities are seen. Chronic rib deformities are noted bilaterally. The patient is status post thoracolumbar spinal fusion.  Review of the MIP images confirms the above findings.  IMPRESSION: 1. No evidence of central pulmonary embolus. 2. Mild bibasilar atelectasis or scarring noted. Lungs not well assessed due to motion artifact. 3. Prominence of the pulmonary vasculature, and significant cardiomegaly.   Electronically Signed   By: Roanna Raider M.D.   On: 05/08/2014 23:36     EKG Interpretation   Date/Time:  Saturday May 08 2014 14:29:10 EDT Ventricular Rate:  134 PR Interval:  134 QRS Duration: 96 QT Interval:  310 QTC Calculation: 462 R Axis:   70 Text Interpretation:  Sinus tachycardia Left ventricular hypertrophy with  repolarization abnormality Abnormal ECG similar to previous Confirmed by  ZAVITZ  MD, JOSHUA ([id number]) on 05/08/2014 3:07:22 PM      MDM   Final diagnoses:  Sinus tachycardia  Shortness of breath   24 year old male with history of scoliosis as well as recent complicated admission for technique or pneumonia. His admission was complicated by rapid decline in respiratory distress requiring intubation and eventually tracheostomy. His tracheostomy was reversed and he was decannulated prior to discharge. He presents today with complaint of shortness of breath without chest pain or productive cough. Oxygen saturations in the 90s. He also  is tachycardic but appears dehydrated on my exam. Afebrile and no hypertension use his lungs are clear to auscultation bilaterally is not in respiratory distress on my exam NO lower extremity swelling and no concern for cardiac or ulnar embolus etiology. Repeat chest x-ray obtained and fluid bolus given as well as screening labs.  Laboratory workup reassuring with no leukocytosis. Mild hypokalemia noted and repleted. Chest x-ray clear. Discussed with his aunt who is at the bedside and she feels that the patient is at his baseline, however his mother is out of town until his anxiety may be playing a part. Continue fluid hydration to assess for response and tachycardia and consider discharge pending fluid hydration. Dimer pending.  Dimer returned elevated. Given tachycardia and tachypnea, PE study ordered for evaluation and found to be negative. Also there were no obvious occult consolidations or infections. His heart rate did improve with hydration down to about 110 and his tachypnea also improved throughout his emergency department stay. His lungs remained clear. Had a long discussion with his father regarding his heart rate in his clinical condition. His father feels that his respiratory status is at baseline and he gives the information that he was persistently tachycardic throughout his recent admission. It was only during the last few days of his stay where they were able to get his heart rate into the 90s intermittently. Despite this, I have urged continued hydration in the outpatient setting as well as close PCP follow-up for evaluation of sinus tachycardia. He is otherwise well appearing and has no complaints. The father feels comfortable with his clinical status and feels like he is stable for discharge. Given reassuring clinical status despite tachycardia, I feel the same. I reinforced that I would like the patient to be seen in the next 2-3 days by his primary care physician. He is to return with any  worsening respiratory symptoms or other abnormalities.  Dorna Leitz, MD 05/09/14 1610  Blane Ohara, MD 05/09/14 [id number]

## 2014-05-08 NOTE — ED Notes (Signed)
Pt reports sob. Family reports hx of recent admission for pneumonia. spo2 93% at triage, ekg done.

## 2014-05-08 NOTE — ED Notes (Signed)
Spoke with Angie, pts mother regarding CTA.

## 2014-05-08 NOTE — ED Notes (Signed)
Notified Dr Durward FortesNickle of critical potassium 2.7.

## 2014-05-09 NOTE — Discharge Instructions (Signed)
Shortness of Breath °Shortness of breath means you have trouble breathing. Shortness of breath needs medical care right away. °HOME CARE  °· Do not smoke. °· Avoid being around chemicals or things (paint fumes, dust) that may bother your breathing. °· Rest as needed. Slowly begin your normal activities. °· Only take medicines as told by your doctor. °· Keep all doctor visits as told. °GET HELP RIGHT AWAY IF:  °· Your shortness of breath gets worse. °· You feel lightheaded, pass out (faint), or have a cough that is not helped by medicine. °· You cough up blood. °· You have pain with breathing. °· You have pain in your chest, arms, shoulders, or belly (abdomen). °· You have a fever. °· You cannot walk up stairs or exercise the way you normally do. °· You do not get better in the time expected. °· You have a hard time doing normal activities even with rest. °· You have problems with your medicines. °· You have any new symptoms. °MAKE SURE YOU: °· Understand these instructions. °· Will watch your condition. °· Will get help right away if you are not doing well or get worse. °Document Released: 07/11/2007 Document Revised: 01/27/2013 Document Reviewed: 04/09/2011 °ExitCare® Patient Information ©2015 [employer]. This information is not intended to replace advice given to you by your health care provider. Make sure you discuss any questions you have with your health care provider. ° °

## 2014-05-12 ENCOUNTER — Emergency Department (HOSPITAL_COMMUNITY)
Admission: EM | Admit: 2014-05-12 | Discharge: 2014-05-14 | Disposition: A | Discharge status: Home / Self Care | Payer: Medicaid Other | Attending: Emergency Medicine | Admitting: Emergency Medicine

## 2014-05-12 ENCOUNTER — Encounter (HOSPITAL_COMMUNITY): Payer: Self-pay | Admitting: *Deleted

## 2014-05-12 DIAGNOSIS — M6281 Muscle weakness (generalized): Secondary | ICD-10-CM | POA: Insufficient documentation

## 2014-05-12 DIAGNOSIS — Z046 Encounter for general psychiatric examination, requested by authority: Secondary | ICD-10-CM

## 2014-05-12 DIAGNOSIS — E876 Hypokalemia: Secondary | ICD-10-CM | POA: Diagnosis not present

## 2014-05-12 DIAGNOSIS — R451 Restlessness and agitation: Secondary | ICD-10-CM | POA: Diagnosis not present

## 2014-05-12 DIAGNOSIS — F4324 Adjustment disorder with disturbance of conduct: Secondary | ICD-10-CM | POA: Diagnosis not present

## 2014-05-12 DIAGNOSIS — F952 Tourette's disorder: Secondary | ICD-10-CM | POA: Insufficient documentation

## 2014-05-12 DIAGNOSIS — F6089 Other specific personality disorders: Secondary | ICD-10-CM | POA: Diagnosis not present

## 2014-05-12 DIAGNOSIS — R4689 Other symptoms and signs involving appearance and behavior: Secondary | ICD-10-CM | POA: Diagnosis present

## 2014-05-12 DIAGNOSIS — M419 Scoliosis, unspecified: Secondary | ICD-10-CM | POA: Diagnosis not present

## 2014-05-12 DIAGNOSIS — Z8776 Personal history of (corrected) congenital malformations of integument, limbs and musculoskeletal system: Secondary | ICD-10-CM | POA: Insufficient documentation

## 2014-05-12 DIAGNOSIS — F39 Unspecified mood [affective] disorder: Secondary | ICD-10-CM | POA: Diagnosis not present

## 2014-05-12 DIAGNOSIS — R633 Feeding difficulties, unspecified: Secondary | ICD-10-CM

## 2014-05-12 DIAGNOSIS — F909 Attention-deficit hyperactivity disorder, unspecified type: Secondary | ICD-10-CM | POA: Insufficient documentation

## 2014-05-12 HISTORY — DX: Unspecified intellectual disabilities: F79

## 2014-05-12 LAB — CBC
HEMATOCRIT: 46.4 % (ref 39.0–52.0)
HEMOGLOBIN: 14.4 g/dL (ref 13.0–17.0)
MCH: 29.7 pg (ref 26.0–34.0)
MCHC: 31 g/dL (ref 30.0–36.0)
MCV: 95.7 fL (ref 78.0–100.0)
Platelets: 194 10*3/uL (ref 150–400)
RBC: 4.85 MIL/uL (ref 4.22–5.81)
RDW: 14.3 % (ref 11.5–15.5)
WBC: 6.2 10*3/uL (ref 4.0–10.5)

## 2014-05-12 LAB — SALICYLATE LEVEL

## 2014-05-12 LAB — RAPID URINE DRUG SCREEN, HOSP PERFORMED
AMPHETAMINES: NOT DETECTED
BENZODIAZEPINES: NOT DETECTED
Barbiturates: NOT DETECTED
COCAINE: NOT DETECTED
OPIATES: NOT DETECTED
TETRAHYDROCANNABINOL: NOT DETECTED

## 2014-05-12 LAB — COMPREHENSIVE METABOLIC PANEL
ALBUMIN: 4.6 g/dL (ref 3.5–5.2)
ALK PHOS: 76 U/L (ref 39–117)
ALT: 20 U/L (ref 0–53)
AST: 28 U/L (ref 0–37)
Anion gap: 13 (ref 5–15)
BUN: 8 mg/dL (ref 6–23)
CO2: 28 mmol/L (ref 19–32)
Calcium: 8.4 mg/dL (ref 8.4–10.5)
Chloride: 100 mmol/L (ref 96–112)
Creatinine, Ser: 0.92 mg/dL (ref 0.50–1.35)
GFR calc non Af Amer: >90 mL/min (ref >90)
GLUCOSE: 99 mg/dL (ref 70–99)
Potassium: 3.1 mmol/L — ABNORMAL LOW (ref 3.5–5.1)
Sodium: 141 mmol/L (ref 135–145)
Total Bilirubin: 1 mg/dL (ref 0.3–1.2)
Total Protein: 7.6 g/dL (ref 6.0–8.3)

## 2014-05-12 LAB — ACETAMINOPHEN LEVEL: Acetaminophen (Tylenol), Serum: <10 ug/mL — ABNORMAL LOW (ref 10–30)

## 2014-05-12 LAB — ETHANOL

## 2014-05-12 MED ORDER — ACETAMINOPHEN 325 MG PO TABS: 650.0000 mg | ORAL_TABLET | ORAL | Status: DC | PRN

## 2014-05-12 MED ORDER — LORAZEPAM 2 MG/ML IJ SOLN
2.0000 mg | Freq: Once | INTRAMUSCULAR | Status: AC
  Administered 2014-05-12: 2 mg via INTRAMUSCULAR

## 2014-05-12 MED ORDER — ZIPRASIDONE MESYLATE 20 MG IM SOLR
10.0000 mg | Freq: Once | INTRAMUSCULAR | Status: AC
  Administered 2014-05-12: 10 mg via INTRAMUSCULAR
  Filled 2014-05-12: qty 20

## 2014-05-12 MED ORDER — POTASSIUM CHLORIDE CRYS ER 20 MEQ PO TBCR
40.0000 meq | EXTENDED_RELEASE_TABLET | Freq: Once | ORAL | Status: AC
  Administered 2014-05-12: 40 meq via ORAL
  Filled 2014-05-12: qty 2

## 2014-05-12 MED ORDER — NICOTINE 21 MG/24HR TD PT24: 21.0000 mg | MEDICATED_PATCH | Freq: Every day | TRANSDERMAL | Status: DC | PRN

## 2014-05-12 MED ORDER — IBUPROFEN 200 MG PO TABS: 600.0000 mg | ORAL_TABLET | Freq: Three times a day (TID) | ORAL | Status: DC | PRN

## 2014-05-12 MED ORDER — STERILE WATER FOR INJECTION IJ SOLN
INTRAMUSCULAR | Status: AC
  Administered 2014-05-12: 17:00:00
  Filled 2014-05-12: qty 10

## 2014-05-12 MED ORDER — ALUM & MAG HYDROXIDE-SIMETH 200-200-20 MG/5ML PO SUSP: 30.0000 mL | ORAL | Status: DC | PRN

## 2014-05-12 MED ORDER — CLONIDINE HCL 0.1 MG PO TABS
0.3000 mg | ORAL_TABLET | Freq: Every day | ORAL | Status: DC
  Administered 2014-05-13: 0.3 mg via ORAL
  Filled 2014-05-12: qty 3

## 2014-05-12 MED ORDER — ZOLPIDEM TARTRATE 5 MG PO TABS: 5.0000 mg | ORAL_TABLET | Freq: Every evening | ORAL | Status: DC | PRN

## 2014-05-12 MED ORDER — LORAZEPAM 2 MG/ML IJ SOLN
INTRAMUSCULAR | Status: AC
  Filled 2014-05-12: qty 1

## 2014-05-12 MED ORDER — ONDANSETRON HCL 4 MG PO TABS
4.0000 mg | ORAL_TABLET | Freq: Three times a day (TID) | ORAL | Status: DC | PRN
  Administered 2014-05-14: 4 mg via ORAL
  Filled 2014-05-12: qty 1

## 2014-05-12 NOTE — ED Notes (Addendum)
PT WILL BE VERBAL WHEN HE IS INCLINED TO DO SO.

## 2014-05-12 NOTE — ED Notes (Signed)
Patient unable to give urine sample at this time. [employer] with patient also.

## 2014-05-12 NOTE — ED Notes (Signed)
Pt taking his pants off and ripping at his gastric tube - pt redirected, offered TV and other distractions unsuccessful. Even after multiple attempts to reapply gastric tube pieces, pt continues to pull at tube and gastric contents spilling onto bed and patient. Pt denies any pain or complaints - however continues to pull at gastric tube very hard. Dr. Juleen ChinaKohut notified and TO given for 2mg  ativan IM.

## 2014-05-12 NOTE — ED Notes (Signed)
Pt. Was sitting in his recliner in his room with security standing in the doorway of his room pt then just spits on the floor. As pt. spits he motions something with is arms.

## 2014-05-12 NOTE — BH Assessment (Signed)
TTS (Brandi) will assess the patient.  

## 2014-05-12 NOTE — ED Notes (Signed)
Pt. Tried to run out the room was redirected back to his bed by security.

## 2014-05-12 NOTE — ED Notes (Signed)
TTNP PRESENT TO EVALUATE THIS PT at bedside.

## 2014-05-12 NOTE — ED Notes (Signed)
Pt asleep on stretcher, no acute distress, resp even and unlabored.

## 2014-05-12 NOTE — ED Notes (Signed)
Upon being placed back in bed, pt quietly went to sleep.
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
## 2014-05-12 NOTE — ED Notes (Signed)
PT AGITATED AND ACTING OUT. ATTEMPTED TO RE DIRECT WITHOUT SUCCESS. MEDICATION GIVEN ABBY RN AND Seirra Kos EMT ASSISTED. OFF DUTY STAND BY.

## 2014-05-12 NOTE — ED Notes (Signed)
CAN COMMUNICATE AT TIMES BY WRITING DOWN ON PAPER
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
## 2014-05-12 NOTE — ED Notes (Signed)
Bed: [id number] Expected date:  Expected time:  Means of arrival:  Comments: Triage 3, needs to be visible
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
## 2014-05-12 NOTE — BHH Counselor (Signed)
Counselor attempted to see the Pt but Charge RN states that the Pt is unable to complete a tele-assess at this time. Charge RN reports that Pt needs to be seen by NP. Counselor informed SAP[facility].  Aaron PhoenixBrandi Sharra Cayabyab, [facility] Triage Specialist

## 2014-05-12 NOTE — ED Notes (Signed)
I [facility] just [facility] Charity fundraiserN and will transport now--Robert, our tech. Is taking him.  Also, his lnch has just arrived.  He successfully ate a Malawiturkey sandwich and drank a Coca Cola without choking.

## 2014-05-13 ENCOUNTER — Emergency Department (HOSPITAL_COMMUNITY): Payer: Medicaid Other

## 2014-05-13 MED ORDER — ARIPIPRAZOLE 5 MG PO TABS
5.0000 mg | ORAL_TABLET | Freq: Once | ORAL | Status: AC
  Administered 2014-05-13: 5 mg via ORAL
  Filled 2014-05-13: qty 1
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
## 2014-05-13 NOTE — ED Notes (Signed)
Spoke to Sprint Nextel CorporationKim, Sports coachCase Manager. Pt will not be able to have peg tube removed at this time.

## 2014-05-13 NOTE — Progress Notes (Signed)
Pt discussed in LOS, regarding pt care plan to be evaluated  from triage on future ED visits. Psychiatry plans to start medication and seek placement today and then discharge to actt tomorrow if placement not found. Pt pending group home placement by actt team.   Olga CoasterKristen Hersel Mcmeen, LCSW  Clinical Social Work  [facility] [phone]

## 2014-05-13 NOTE — ED Notes (Signed)
Social work at bedside.  

## 2014-05-13 NOTE — Progress Notes (Signed)
Discussed with TCU RN whether or not pt would be able to understanding that his tube needs to stay in until 05/28/14 per IR  1705 CM reviewed with Quality Collaborative consult MD reasons provided for not being able to have G Tube removed at this time  1719 Father arrived to visit pt

## 2014-05-13 NOTE — ED Notes (Signed)
Pt ate 100% of his breakfast.

## 2014-05-13 NOTE — Progress Notes (Addendum)
  CARE MANAGEMENT ED NOTE 05/13/2014  Patient:  [name],[name]   Account Number:  [id number]  Date Initiated:  05/13/2014  Documentation initiated by:  Edd ArbourGIBBS,Flois Mctague  Subjective/Objective Assessment:   24 yr [place] brought in by [employer], IVC, dx with mild MR, ADHD, and tourette's. Since his release from the hospital, he had begun talking to himself as if he is carrying on a converstaion with people not     Subjective/Objective Assessment Detail:   pcp clarence williams  Pt with 1[facility] admission and 2 ED visits in last 6 months    [facility] noted to take pants off and pull at his Gastric tube very hard even with distractions  From D/c summary on 04/28/14  Cm noted pt with trach placement on 04/07/14- decannulated on 04/27/14 , Gatrostomy per IR on 04/16/14     Action/Plan:   Pt discussed in SAPPU and Quality collaborative meeing Recommendation was to see if Gastric tube can be removed CM reviewed EPIC notes, orders Noted EDP, Mcmanus order at 1136 to have IR remove GTube   Action/Plan Detail:   1234 spoke wth kathy in IR and they will come to [facility] G [facility] 1237 Olegario MessierKathy states G tube needs to stay in 6-8 wks only in 4 weeks per Dr Fredia Sorrowyamagata   Anticipated DC Date:       Status Recommendation to Physician:   Result of Recommendation:    Other ED Services  Consult Working Plan   In-house referral  Clinical Social Worker   DC Associate Professorlanning Services  Other  Outpatient Services - Pt will follow up    Choice offered to / List presented to:            Status of service:  Completed, signed off  ED Comments:   ED Comments Detail:   05/13/14 1414 ED CM spoke with Will, General surgery, about this pt and if G Tube can be removed.  He and CM found order on 04/16/14 put in by Dr Tyson AliasFeinstein for IR to place tube.  Response is that IR confirms when G tube needs to be removed
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
## 2014-05-13 NOTE — ED Notes (Signed)
Psychiatry at bedside.

## 2014-05-14 DIAGNOSIS — F4324 Adjustment disorder with disturbance of conduct: Secondary | ICD-10-CM | POA: Diagnosis not present

## 2014-05-14 DIAGNOSIS — F6089 Other specific personality disorders: Secondary | ICD-10-CM

## 2014-05-14 DIAGNOSIS — R4689 Other symptoms and signs involving appearance and behavior: Secondary | ICD-10-CM | POA: Diagnosis present

## 2014-05-14 MED ORDER — ARIPIPRAZOLE 5 MG PO TABS
5.0000 mg | ORAL_TABLET | Freq: Every day | ORAL | Status: DC
  Administered 2014-05-14: 5 mg via ORAL
  Filled 2014-05-14: qty 1

## 2014-05-14 MED ORDER — CLONIDINE HCL 0.1 MG PO TABS
0.1000 mg | ORAL_TABLET | Freq: Every day | ORAL | Status: DC
  Administered 2014-05-14: 0.1 mg via ORAL
  Filled 2014-05-14: qty 1

## 2014-05-14 NOTE — ED Notes (Signed)
MD at bedside. 

## 2014-05-14 NOTE — Progress Notes (Signed)
ED CM spoke briefly again with father before he left. Father and grandmother visited pt.  Noted pt to be very pleased, happy to see them and smiling.   Father informed CM he is aware that mother is scheduled to pick up pt and he will attempt to see pt after he is d/c home.

## 2014-05-14 NOTE — ED Notes (Addendum)
USING PHONE CALLING MOTHER

## 2014-05-14 NOTE — Progress Notes (Signed)
Per psychiatrist and Np, patient psychiatrically stable to discharge home. [facility], and [facility]. Patient mother to pick up patient at 6pm.   Olga CoasterKristen Sheketa Ende, LCSW  Clinical Social Work  [facility] [phone]

## 2014-05-14 NOTE — Consult Note (Signed)
[facility] Psychiatry Consult   Reason for Consult:  Mood disorder, ADHD, Tourette's Syndrome and Mild MR Referring Physician:  EDP Patient Identification: [name] MRN:  [id number] Principal Diagnosis: Adjustment disorder with disturbance of conduct Diagnosis:   Patient Active Problem List   Diagnosis Date Noted  . Aggression [F60.89] 05/14/2014    Priority: High  . Adjustment disorder with disturbance of conduct [F43.24] 05/14/2014    Priority: High  . Mood disorder [F39] 05/12/2014    Priority: High  . Attention deficit hyperactivity disorder [F90.9]   . Tourette's syndrome [F95.2]   . ARDS (adult respiratory distress syndrome) [J80] 04/24/2014  . Encounter for feeding tube placement [Z87.898]   . Dysphagia [R13.10]   . Respiratory abnormalities [J98.9] 04/07/2014  . Pneumonia [J18.9]   . Shortness of breath [R06.02]   . Acute respiratory failure [J96.00]   . Acute respiratory failure with hypoxia [J96.01]   . Encounter for orogastric (OG) tube placement [Z46.59]   . History of ETT [Z92.89]   . Respiratory failure with hypoxia [J96.91] 03/31/2014  . 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 Acute encephalopathy [G93.40] 01/30/2013  . Tourettes syndrome [F95.2] 01/30/2013  . 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 Attention deficit disorder [F90.9] 10/09/2012  . Tics of organic origin [G25.69] 10/09/2012  . Insomnia [G47.00] 10/09/2012    Total Time spent with patient: 30 minutes  Subjective:   Casimer Hidrogo is a 24 y.o. male patient has stabilized  HPI:  The patient has stabilized and has been calm/cooperative after medication on admission.  Denies suicidal/homicidal ideations, hallucinations, and alcohol/drug issues.    HPI Elements:   Location:  Mood disorder, unspecified, ADHD by hx, Tourettes syndrome. Quality:  severe, agitated, aggressive, angry. Severity:  severe. Timing:   Acute. Duration:  Chronic mood illness. Context:  Brought in by parents for evalauation of agitation and aggression.  Past Medical History:  Past Medical History  Diagnosis Date  . Tourette's syndrome   . Tic disorder   . Scoliosis   . Hemiatrophy of right leg   . Club foot     Right  . Mental retardation     mild    Past Surgical History  Procedure Laterality Date  . Foot surgery Right 2002  . Eye muscle surgery Bilateral 1998 and 2002  . Femoral hernia repair      Done when he was an infant  . Harrington rod surgery  2003    For Scoliosis  . Tracheostomy tube placement N/A 04/07/2014    Procedure: TRACHEOSTOMY;  Surgeon: Jodi Marble, MD;  Location: [facility] OR;  Service: ENT;  Laterality: N/A;   Family History:  Family History  Problem Relation Age of Onset  . Breast cancer Maternal Grandmother     Died in her mid 65's  . Prostate cancer Maternal Grandfather     Died in his 67's  . Hypertension     Social History:  History  Alcohol Use No     History  Drug Use No    History   Social History  . Marital Status: Single    Spouse Name: N/A  . Number of Children: N/A  . Years of Education: N/A   Social History Main Topics  . Smoking status: Never Smoker   . Smokeless tobacco: Never Used  . Alcohol Use: No  . Drug Use: No  . Sexual Activity: No   Other Topics Concern  .  None   Social History Narrative   Additional Social History:                          Allergies:  No Known Allergies  Labs:  Results for orders placed or performed during the hospital encounter of 05/12/14 (from the past 48 hour(s))  Acetaminophen level     Status: Abnormal   Collection Time: 05/12/14 11:32 AM  Result Value Ref Range   Acetaminophen (Tylenol), Serum <10.0 (L) 10 - 30 ug/mL    Comment:        THERAPEUTIC CONCENTRATIONS VARY SIGNIFICANTLY. A RANGE OF 10-30 ug/mL MAY BE AN EFFECTIVE CONCENTRATION FOR MANY PATIENTS. HOWEVER, SOME ARE BEST TREATED AT  CONCENTRATIONS OUTSIDE THIS RANGE. ACETAMINOPHEN CONCENTRATIONS >150 ug/mL AT 4 HOURS AFTER INGESTION AND >50 ug/mL AT 12 HOURS AFTER INGESTION ARE OFTEN ASSOCIATED WITH TOXIC REACTIONS.   CBC     Status: None   Collection Time: 05/12/14 11:32 AM  Result Value Ref Range   WBC 6.2 4.0 - 10.5 K/uL   RBC 4.85 4.22 - 5.81 MIL/uL   Hemoglobin 14.4 13.0 - 17.0 g/dL   HCT 46.4 39.0 - 52.0 %   MCV 95.7 78.0 - 100.0 fL   MCH 29.7 26.0 - 34.0 pg   MCHC 31.0 30.0 - 36.0 g/dL   RDW 14.3 11.5 - 15.5 %   Platelets 194 150 - 400 K/uL  Comprehensive metabolic panel     Status: Abnormal   Collection Time: 05/12/14 11:32 AM  Result Value Ref Range   Sodium 141 135 - 145 mmol/L   Potassium 3.1 (L) 3.5 - 5.1 mmol/L   Chloride 100 96 - 112 mmol/L   CO2 28 19 - 32 mmol/L   Glucose, Bld 99 70 - 99 mg/dL   BUN 8 6 - 23 mg/dL   Creatinine, Ser 0.92 0.50 - 1.35 mg/dL   Calcium 8.4 8.4 - 10.5 mg/dL   Total Protein 7.6 6.0 - 8.3 g/dL   Albumin 4.6 3.5 - 5.2 g/dL   AST 28 0 - 37 U/L   ALT 20 0 - 53 U/L   Alkaline Phosphatase 76 39 - 117 U/L   Total Bilirubin 1.0 0.3 - 1.2 mg/dL   GFR calc non Af Amer >90 >90 mL/min   GFR calc Af Amer >90 >90 mL/min    Comment: (NOTE) The eGFR has been calculated using the CKD EPI equation. This calculation has not been validated in all clinical situations. eGFR's persistently <90 mL/min signify possible Chronic Kidney Disease.    Anion gap 13 5 - 15  Ethanol (ETOH)     Status: None   Collection Time: 05/12/14 11:32 AM  Result Value Ref Range   Alcohol, Ethyl (B) <5 0 - 9 mg/dL    Comment:        LOWEST DETECTABLE LIMIT FOR SERUM ALCOHOL IS 11 mg/dL FOR MEDICAL PURPOSES ONLY   Salicylate level     Status: None   Collection Time: 05/12/14 11:32 AM  Result Value Ref Range   Salicylate Lvl <4.5 2.8 - 20.0 mg/dL  Urine Drug Screen     Status: None   Collection Time: 05/12/14  2:59 PM  Result Value Ref Range   Opiates NONE DETECTED NONE DETECTED    Cocaine NONE DETECTED NONE DETECTED   Benzodiazepines NONE DETECTED NONE DETECTED   Amphetamines NONE DETECTED NONE DETECTED   Tetrahydrocannabinol NONE DETECTED NONE DETECTED   Barbiturates NONE DETECTED  NONE DETECTED    Comment:        DRUG SCREEN FOR MEDICAL PURPOSES ONLY.  IF CONFIRMATION IS NEEDED FOR ANY PURPOSE, NOTIFY LAB WITHIN 5 DAYS.        LOWEST DETECTABLE LIMITS FOR URINE DRUG SCREEN Drug Class       Cutoff (ng/mL) Amphetamine      1000 Barbiturate      200 Benzodiazepine   161 Tricyclics       096 Opiates          300 Cocaine          300 THC              50     Vitals: Blood pressure 157/101, pulse 102, temperature 98.7 F (37.1 C), temperature source Oral, resp. rate 18, SpO2 90 %.  Risk to Self: Is patient at risk for suicide?: No, but patient needs Medical Clearance (see triage note. pt IVC, mild mr, tourettes, adhd, has G tube that pt keeps trying to pull out. ) Risk to Others:   Prior Inpatient Therapy:   Prior Outpatient Therapy:    Current Facility-Administered Medications  Medication Dose Route Frequency Provider Last Rate Last Dose  . acetaminophen (TYLENOL) tablet 650 mg  650 mg Oral Q4H PRN Daleen Bo, MD      . alum & mag hydroxide-simeth (MAALOX/MYLANTA) 200-200-20 MG/5ML suspension 30 mL  30 mL Oral PRN Daleen Bo, MD      . ARIPiprazole (ABILIFY) tablet 5 mg  5 mg Oral Daily Eleena Grater      . cloNIDine (CATAPRES) tablet 0.1 mg  0.1 mg Oral Daily Patrecia Pour, NP      . cloNIDine (CATAPRES) tablet 0.3 mg  0.3 mg Oral QHS Daleen Bo, MD   0.3 mg at 05/13/14 2133  . ibuprofen (ADVIL,MOTRIN) tablet 600 mg  600 mg Oral Q8H PRN Daleen Bo, MD      . nicotine (NICODERM CQ - dosed in mg/24 hours) patch 21 mg  21 mg Transdermal Daily PRN Daleen Bo, MD      . ondansetron [facility]) tablet 4 mg  4 mg Oral Q8H PRN Daleen Bo, MD       Current Outpatient Prescriptions  Medication Sig Dispense Refill  . cloNIDine (CATAPRES) 0.1 MG  tablet TAKE 3 TABLETS BY MOUTH AT BEDTIME 270 tablet 0  . Dexmethylphenidate HCl 35 MG CP24 Take 1 capsule every morning 30 capsule 0  . cloNIDine (CATAPRES) 0.1 MG tablet TAKE 3 TABLETS BY MOUTH AT BEDTIME (Patient not taking: Reported on 05/08/2014) 270 tablet 0    Musculoskeletal: Strength & Muscle Tone: within normal limits Gait & Station: normal Patient leans: N/A  Psychiatric Specialty Exam:     Blood pressure 157/101, pulse 102, temperature 98.7 F (37.1 C), temperature source Oral, resp. rate 18, SpO2 90 %.There is no weight on file to calculate BMI.  General Appearance: Casual  Eye Contact::  Good  Speech:  whispers, smiles  Volume:  barely audible, smiles  Mood:  Euthymic  Affect:  Congruent  Thought Process:  Linear and not able to answer questions, smiles  Orientation:  Other:  smiles with no answer  Thought Content:  WDL  Suicidal Thoughts:  No  Homicidal Thoughts:  No  Memory:  unable to ascertain, patient smiles when asked questions.  Judgement:  Fair  Insight:  Fair  Psychomotor Activity:  Normal  Concentration:  Good  Recall:  NA  Fund of Knowledge:Poor  Language: none  Akathisia:  NA  Handed:  Right  AIMS (if indicated):     Assets:  Desire for Improvement  ADL's:  Intact  Cognition: Impaired,  Severe  Sleep:      Medical Decision Making: Self-Limited or Minor (1) and Established Problem, Worsening (2)  Treatment Plan Summary:  Plan:  Discharge home, follow-up with regular providers Disposition: Discharge  Waylan Boga   PMHNP-BC 05/14/2014 11:06 AM Patient seen face-to-face for psychiatric evaluation, chart reviewed and case discussed with the physician extender and developed treatment plan. Reviewed the information documented and agree with the treatment plan. Corena Pilgrim, MD

## 2014-05-14 NOTE — [facility] Suicide Risk Assessment (Signed)
Suicide Risk Assessment  Discharge Assessment   [facility]   Demographic Factors:  Male and Adolescent or young adult  Total Time spent with patient: 30 minutes  Musculoskeletal: Strength & Muscle Tone: within normal limits Gait & Station: normal Patient leans: N/A  Psychiatric Specialty Exam:     Blood pressure 157/101, pulse 102, temperature 98.7 F (37.1 C), temperature source Oral, resp. rate 18, SpO2 90 %.There is no weight on file to calculate BMI.  General Appearance: Casual  Eye Contact::  Good  Speech:  whispers, smiles  Volume:  barely audible, smiles  Mood:  Euthymic  Affect:  Congruent  Thought Process:  Linear and not able to answer questions, smiles  Orientation:  Other:  smiles with no answer  Thought Content:  WDL  Suicidal Thoughts:  No  Homicidal Thoughts:  No  Memory:  unable to ascertain, patient smiles when asked questions.  Judgement:  Fair  Insight:  Fair  Psychomotor Activity:  Normal  Concentration:  Good  Recall:  NA  Fund of Knowledge:Poor  Language: none  Akathisia:  NA  Handed:  Right  AIMS (if indicated):     Assets:  Desire for Improvement  ADL's:  Intact  Cognition: Impaired,  Severe  Sleep:      Has this patient used any form of tobacco in the last 30 days? (Cigarettes, Smokeless Tobacco, Cigars, and/or Pipes) No  Mental Status Per Nursing Assessment::   On Admission:   Aggressive  Current Mental Status by Physician: NA  Loss Factors: NA  Historical Factors: Impulsivity  Risk Reduction Factors:   Sense of responsibility to family, Living with another person, especially a relative, Positive social support and Positive therapeutic relationship  Continued Clinical Symptoms:  None  Cognitive Features That Contribute To Risk:  None    Suicide Risk:  Minimal: No identifiable suicidal ideation.  Patients presenting with no risk factors but with morbid ruminations; may be classified as minimal  risk based on the severity of the depressive symptoms  Principal Problem: Adjustment disorder with disturbance of conduct Discharge Diagnoses:  Patient Active Problem List   Diagnosis Date Noted  . Aggression [F60.89] 05/14/2014    Priority: High  . Adjustment disorder with disturbance of conduct [F43.24] 05/14/2014    Priority: High  . Mood disorder [F39] 05/12/2014    Priority: High  . Attention deficit hyperactivity disorder [F90.9]   . Tourette's syndrome [F95.2]   . ARDS (adult respiratory distress syndrome) [J80] 04/24/2014  . Encounter for feeding tube placement [Z87.898]   . Dysphagia [R13.10]   . Respiratory abnormalities [J98.9] 04/07/2014  . Pneumonia [J18.9]   . Shortness of breath [R06.02]   . Acute respiratory failure [J96.00]   . Acute respiratory failure with hypoxia [J96.01]   . Encounter for orogastric (OG) tube placement [Z46.59]   . History of ETT [Z92.89]   . Respiratory failure with hypoxia [J96.91] 03/31/2014  . 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 Acute encephalopathy [G93.40] 01/30/2013  . Tourettes syndrome [F95.2] 01/30/2013  . 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 Attention deficit disorder [F90.9] 10/09/2012  . Tics of organic origin [G25.69] 10/09/2012  . Insomnia [G47.00] 10/09/2012      Plan Of Care/Follow-up recommendations:  Activity:  as tolerated Diet:  heart healthy diet  Is patient on multiple antipsychotic therapies at discharge:  No   Has Patient had three or more failed trials of antipsychotic  monotherapy by history:  No  Recommended Plan for Multiple Antipsychotic Therapies: NA    LORD, JAMISON, PMH-NP 05/14/2014, 11:12 AM

## 2014-05-23 ENCOUNTER — Inpatient Hospital Stay (HOSPITAL_COMMUNITY): Payer: Medicaid Other

## 2014-05-23 ENCOUNTER — Emergency Department (HOSPITAL_COMMUNITY): Payer: Medicaid Other

## 2014-05-23 ENCOUNTER — Inpatient Hospital Stay (HOSPITAL_COMMUNITY)
Admission: EM | Admit: 2014-05-23 | Discharge: 2014-07-07 | DRG: 003 | Disposition: E | Payer: Medicaid Other | Attending: Pulmonary Disease | Admitting: Pulmonary Disease

## 2014-05-23 ENCOUNTER — Encounter (HOSPITAL_COMMUNITY): Payer: Self-pay

## 2014-05-23 DIAGNOSIS — Z515 Encounter for palliative care: Secondary | ICD-10-CM | POA: Diagnosis not present

## 2014-05-23 DIAGNOSIS — Z6834 Body mass index (BMI) 34.0-34.9, adult: Secondary | ICD-10-CM | POA: Diagnosis not present

## 2014-05-23 DIAGNOSIS — E46 Unspecified protein-calorie malnutrition: Secondary | ICD-10-CM | POA: Diagnosis not present

## 2014-05-23 DIAGNOSIS — Z8042 Family history of malignant neoplasm of prostate: Secondary | ICD-10-CM

## 2014-05-23 DIAGNOSIS — E876 Hypokalemia: Secondary | ICD-10-CM | POA: Diagnosis not present

## 2014-05-23 DIAGNOSIS — Z79899 Other long term (current) drug therapy: Secondary | ICD-10-CM | POA: Diagnosis not present

## 2014-05-23 DIAGNOSIS — Z452 Encounter for adjustment and management of vascular access device: Secondary | ICD-10-CM

## 2014-05-23 DIAGNOSIS — Z9289 Personal history of other medical treatment: Secondary | ICD-10-CM

## 2014-05-23 DIAGNOSIS — N170 Acute kidney failure with tubular necrosis: Secondary | ICD-10-CM | POA: Diagnosis not present

## 2014-05-23 DIAGNOSIS — R0902 Hypoxemia: Secondary | ICD-10-CM

## 2014-05-23 DIAGNOSIS — A419 Sepsis, unspecified organism: Secondary | ICD-10-CM | POA: Diagnosis not present

## 2014-05-23 DIAGNOSIS — F909 Attention-deficit hyperactivity disorder, unspecified type: Secondary | ICD-10-CM | POA: Diagnosis present

## 2014-05-23 DIAGNOSIS — T68XXXA Hypothermia, initial encounter: Secondary | ICD-10-CM | POA: Diagnosis present

## 2014-05-23 DIAGNOSIS — M419 Scoliosis, unspecified: Secondary | ICD-10-CM | POA: Diagnosis present

## 2014-05-23 DIAGNOSIS — E871 Hypo-osmolality and hyponatremia: Secondary | ICD-10-CM | POA: Diagnosis not present

## 2014-05-23 DIAGNOSIS — J984 Other disorders of lung: Secondary | ICD-10-CM | POA: Diagnosis present

## 2014-05-23 DIAGNOSIS — I469 Cardiac arrest, cause unspecified: Secondary | ICD-10-CM | POA: Diagnosis not present

## 2014-05-23 DIAGNOSIS — Z978 Presence of other specified devices: Secondary | ICD-10-CM

## 2014-05-23 DIAGNOSIS — F988 Other specified behavioral and emotional disorders with onset usually occurring in childhood and adolescence: Secondary | ICD-10-CM | POA: Diagnosis present

## 2014-05-23 DIAGNOSIS — J80 Acute respiratory distress syndrome: Secondary | ICD-10-CM | POA: Diagnosis not present

## 2014-05-23 DIAGNOSIS — E872 Acidosis: Secondary | ICD-10-CM | POA: Diagnosis present

## 2014-05-23 DIAGNOSIS — Y848 Other medical procedures as the cause of abnormal reaction of the patient, or of later complication, without mention of misadventure at the time of the procedure: Secondary | ICD-10-CM | POA: Diagnosis not present

## 2014-05-23 DIAGNOSIS — I5021 Acute systolic (congestive) heart failure: Secondary | ICD-10-CM | POA: Diagnosis present

## 2014-05-23 DIAGNOSIS — J690 Pneumonitis due to inhalation of food and vomit: Secondary | ICD-10-CM

## 2014-05-23 DIAGNOSIS — E669 Obesity, unspecified: Secondary | ICD-10-CM | POA: Diagnosis present

## 2014-05-23 DIAGNOSIS — R739 Hyperglycemia, unspecified: Secondary | ICD-10-CM | POA: Diagnosis not present

## 2014-05-23 DIAGNOSIS — R1010 Upper abdominal pain, unspecified: Secondary | ICD-10-CM | POA: Diagnosis not present

## 2014-05-23 DIAGNOSIS — G2569 Other tics of organic origin: Secondary | ICD-10-CM | POA: Diagnosis present

## 2014-05-23 DIAGNOSIS — Y95 Nosocomial condition: Secondary | ICD-10-CM | POA: Diagnosis present

## 2014-05-23 DIAGNOSIS — T80211A Bloodstream infection due to central venous catheter, initial encounter: Secondary | ICD-10-CM | POA: Diagnosis present

## 2014-05-23 DIAGNOSIS — Z66 Do not resuscitate: Secondary | ICD-10-CM | POA: Diagnosis present

## 2014-05-23 DIAGNOSIS — Z9911 Dependence on respirator [ventilator] status: Secondary | ICD-10-CM | POA: Diagnosis not present

## 2014-05-23 DIAGNOSIS — J9690 Respiratory failure, unspecified, unspecified whether with hypoxia or hypercapnia: Secondary | ICD-10-CM

## 2014-05-23 DIAGNOSIS — Z01818 Encounter for other preprocedural examination: Secondary | ICD-10-CM

## 2014-05-23 DIAGNOSIS — R1011 Right upper quadrant pain: Secondary | ICD-10-CM | POA: Diagnosis not present

## 2014-05-23 DIAGNOSIS — N189 Chronic kidney disease, unspecified: Secondary | ICD-10-CM | POA: Diagnosis present

## 2014-05-23 DIAGNOSIS — J9811 Atelectasis: Secondary | ICD-10-CM

## 2014-05-23 DIAGNOSIS — J189 Pneumonia, unspecified organism: Secondary | ICD-10-CM | POA: Diagnosis not present

## 2014-05-23 DIAGNOSIS — D649 Anemia, unspecified: Secondary | ICD-10-CM | POA: Diagnosis present

## 2014-05-23 DIAGNOSIS — R570 Cardiogenic shock: Secondary | ICD-10-CM | POA: Diagnosis not present

## 2014-05-23 DIAGNOSIS — I471 Supraventricular tachycardia: Secondary | ICD-10-CM | POA: Diagnosis not present

## 2014-05-23 DIAGNOSIS — Q6689 Other  specified congenital deformities of feet: Secondary | ICD-10-CM | POA: Diagnosis not present

## 2014-05-23 DIAGNOSIS — J9621 Acute and chronic respiratory failure with hypoxia: Secondary | ICD-10-CM | POA: Diagnosis not present

## 2014-05-23 DIAGNOSIS — Z803 Family history of malignant neoplasm of breast: Secondary | ICD-10-CM

## 2014-05-23 DIAGNOSIS — E873 Alkalosis: Secondary | ICD-10-CM | POA: Diagnosis not present

## 2014-05-23 DIAGNOSIS — J9601 Acute respiratory failure with hypoxia: Secondary | ICD-10-CM

## 2014-05-23 DIAGNOSIS — E874 Mixed disorder of acid-base balance: Secondary | ICD-10-CM | POA: Diagnosis not present

## 2014-05-23 DIAGNOSIS — J9602 Acute respiratory failure with hypercapnia: Secondary | ICD-10-CM | POA: Diagnosis not present

## 2014-05-23 DIAGNOSIS — R140 Abdominal distension (gaseous): Secondary | ICD-10-CM

## 2014-05-23 DIAGNOSIS — F79 Unspecified intellectual disabilities: Secondary | ICD-10-CM | POA: Diagnosis present

## 2014-05-23 DIAGNOSIS — I10 Essential (primary) hypertension: Secondary | ICD-10-CM | POA: Diagnosis present

## 2014-05-23 DIAGNOSIS — Z8249 Family history of ischemic heart disease and other diseases of the circulatory system: Secondary | ICD-10-CM

## 2014-05-23 DIAGNOSIS — I420 Dilated cardiomyopathy: Secondary | ICD-10-CM | POA: Diagnosis not present

## 2014-05-23 DIAGNOSIS — I5023 Acute on chronic systolic (congestive) heart failure: Secondary | ICD-10-CM | POA: Diagnosis not present

## 2014-05-23 DIAGNOSIS — N179 Acute kidney failure, unspecified: Secondary | ICD-10-CM | POA: Insufficient documentation

## 2014-05-23 DIAGNOSIS — I429 Cardiomyopathy, unspecified: Secondary | ICD-10-CM | POA: Diagnosis not present

## 2014-05-23 DIAGNOSIS — E162 Hypoglycemia, unspecified: Secondary | ICD-10-CM | POA: Diagnosis not present

## 2014-05-23 DIAGNOSIS — J9600 Acute respiratory failure, unspecified whether with hypoxia or hypercapnia: Secondary | ICD-10-CM | POA: Diagnosis not present

## 2014-05-23 DIAGNOSIS — Q761 Klippel-Feil syndrome: Secondary | ICD-10-CM

## 2014-05-23 DIAGNOSIS — R1310 Dysphagia, unspecified: Secondary | ICD-10-CM

## 2014-05-23 DIAGNOSIS — I959 Hypotension, unspecified: Secondary | ICD-10-CM | POA: Diagnosis not present

## 2014-05-23 DIAGNOSIS — Y838 Other surgical procedures as the cause of abnormal reaction of the patient, or of later complication, without mention of misadventure at the time of the procedure: Secondary | ICD-10-CM | POA: Diagnosis present

## 2014-05-23 DIAGNOSIS — K9423 Gastrostomy malfunction: Secondary | ICD-10-CM | POA: Diagnosis present

## 2014-05-23 DIAGNOSIS — F952 Tourette's disorder: Secondary | ICD-10-CM | POA: Diagnosis present

## 2014-05-23 DIAGNOSIS — Z930 Tracheostomy status: Secondary | ICD-10-CM

## 2014-05-23 DIAGNOSIS — R109 Unspecified abdominal pain: Secondary | ICD-10-CM

## 2014-05-23 DIAGNOSIS — R001 Bradycardia, unspecified: Secondary | ICD-10-CM | POA: Diagnosis not present

## 2014-05-23 DIAGNOSIS — E870 Hyperosmolality and hypernatremia: Secondary | ICD-10-CM | POA: Diagnosis not present

## 2014-05-23 DIAGNOSIS — R7989 Other specified abnormal findings of blood chemistry: Secondary | ICD-10-CM | POA: Diagnosis not present

## 2014-05-23 DIAGNOSIS — K567 Ileus, unspecified: Secondary | ICD-10-CM

## 2014-05-23 DIAGNOSIS — G9340 Encephalopathy, unspecified: Secondary | ICD-10-CM | POA: Diagnosis not present

## 2014-05-23 DIAGNOSIS — I340 Nonrheumatic mitral (valve) insufficiency: Secondary | ICD-10-CM | POA: Diagnosis not present

## 2014-05-23 DIAGNOSIS — Z9889 Other specified postprocedural states: Secondary | ICD-10-CM

## 2014-05-23 DIAGNOSIS — R4182 Altered mental status, unspecified: Secondary | ICD-10-CM | POA: Diagnosis present

## 2014-05-23 DIAGNOSIS — R0600 Dyspnea, unspecified: Secondary | ICD-10-CM

## 2014-05-23 DIAGNOSIS — Z4659 Encounter for fitting and adjustment of other gastrointestinal appliance and device: Secondary | ICD-10-CM

## 2014-05-23 LAB — PROTIME-INR
INR: 1.09 (ref 0.00–1.49)
Prothrombin Time: 14.2 seconds (ref 11.6–15.2)

## 2014-05-23 LAB — I-STAT CG4 LACTIC ACID, ED
Lactic Acid, Venous: 4.23 mmol/L (ref 0.5–2.0)
Lactic Acid, Venous: 1.09 mmol/L (ref 0.5–2.0)

## 2014-05-23 LAB — CBC WITH DIFFERENTIAL/PLATELET
BASOS ABS: 0 10*3/uL (ref 0.0–0.1)
Basophils Relative: 1 % (ref 0–1)
EOS ABS: 0.1 10*3/uL (ref 0.0–0.7)
Eosinophils Relative: 2 % (ref 0–5)
HEMATOCRIT: 50.4 % (ref 39.0–52.0)
Hemoglobin: 16 g/dL (ref 13.0–17.0)
Lymphocytes Relative: 15 % (ref 12–46)
Lymphs Abs: 0.9 10*3/uL (ref 0.7–4.0)
MCH: 30.6 pg (ref 26.0–34.0)
MCHC: 31.7 g/dL (ref 30.0–36.0)
MCV: 96.4 fL (ref 78.0–100.0)
MONOS PCT: 14 % — AB (ref 3–12)
Monocytes Absolute: 0.9 10*3/uL (ref 0.1–1.0)
NEUTROS ABS: 4.2 10*3/uL (ref 1.7–7.7)
NEUTROS PCT: 68 % (ref 43–77)
Platelets: 305 10*3/uL (ref 150–400)
RBC: 5.23 MIL/uL (ref 4.22–5.81)
RDW: 13.5 % (ref 11.5–15.5)
WBC: 6.2 10*3/uL (ref 4.0–10.5)

## 2014-05-23 LAB — URINALYSIS, ROUTINE W REFLEX MICROSCOPIC
Bilirubin Urine: NEGATIVE
Glucose, UA: 100 mg/dL — AB
Hgb urine dipstick: NEGATIVE
KETONES UR: NEGATIVE mg/dL
LEUKOCYTES UA: NEGATIVE
NITRITE: NEGATIVE
PROTEIN: NEGATIVE mg/dL
SPECIFIC GRAVITY, URINE: 1.017 (ref 1.005–1.030)
Urobilinogen, UA: 0.2 mg/dL (ref 0.0–1.0)
pH: 6 (ref 5.0–8.0)

## 2014-05-23 LAB — COMPREHENSIVE METABOLIC PANEL
ALT: 19 U/L (ref 0–53)
ANION GAP: 13 (ref 5–15)
AST: 30 U/L (ref 0–37)
Albumin: 4.5 g/dL (ref 3.5–5.2)
Alkaline Phosphatase: 75 U/L (ref 39–117)
BILIRUBIN TOTAL: 0.5 mg/dL (ref 0.3–1.2)
BUN: 7 mg/dL (ref 6–23)
CALCIUM: 9.2 mg/dL (ref 8.4–10.5)
CHLORIDE: 101 mmol/L (ref 96–112)
CO2: 26 mmol/L (ref 19–32)
CREATININE: 0.8 mg/dL (ref 0.50–1.35)
GFR calc Af Amer: >90 mL/min (ref >90)
GFR calc non Af Amer: >90 mL/min (ref >90)
GLUCOSE: 126 mg/dL — AB (ref 70–99)
POTASSIUM: 3.7 mmol/L (ref 3.5–5.1)
SODIUM: 140 mmol/L (ref 135–145)
TOTAL PROTEIN: 7.9 g/dL (ref 6.0–8.3)

## 2014-05-23 LAB — APTT: aPTT: 30 seconds (ref 24–37)

## 2014-05-23 LAB — PROCALCITONIN: Procalcitonin: <0.1 ng/mL

## 2014-05-23 LAB — VITAMIN B12: Vitamin B-12: 678 pg/mL (ref 211–911)

## 2014-05-23 LAB — CBG MONITORING, ED: GLUCOSE-CAPILLARY: 113 mg/dL — AB (ref 70–99)

## 2014-05-23 LAB — LIPASE, BLOOD: LIPASE: 53 U/L (ref 11–59)

## 2014-05-23 LAB — TSH: TSH: 1.679 u[IU]/mL (ref 0.350–4.500)

## 2014-05-23 MED ORDER — SODIUM CHLORIDE 0.9 % IV BOLUS (SEPSIS)
1000.0000 mL | INTRAVENOUS | Status: AC
  Administered 2014-05-23 (×2): 1000 mL via INTRAVENOUS

## 2014-05-23 MED ORDER — PIPERACILLIN-TAZOBACTAM 3.375 G IVPB
3.3750 g | Freq: Three times a day (TID) | INTRAVENOUS | Status: DC
  Administered 2014-05-23 – 2014-05-25 (×8): 3.375 g via INTRAVENOUS
  Filled 2014-05-23 (×10): qty 50

## 2014-05-23 MED ORDER — PIPERACILLIN-TAZOBACTAM 3.375 G IVPB 30 MIN
3.3750 g | Freq: Once | INTRAVENOUS | Status: AC
  Administered 2014-05-23: 3.375 g via INTRAVENOUS
  Filled 2014-05-23: qty 50

## 2014-05-23 MED ORDER — ACETAMINOPHEN 325 MG PO TABS
650.0000 mg | ORAL_TABLET | Freq: Four times a day (QID) | ORAL | Status: DC | PRN
  Administered 2014-05-25 – 2014-05-26 (×2): 650 mg via ORAL
  Filled 2014-05-23 (×2): qty 2

## 2014-05-23 MED ORDER — VANCOMYCIN HCL IN DEXTROSE 1-5 GM/200ML-% IV SOLN
1000.0000 mg | Freq: Once | INTRAVENOUS | Status: AC
  Administered 2014-05-23: 1000 mg via INTRAVENOUS
  Filled 2014-05-23: qty 200

## 2014-05-23 MED ORDER — SODIUM CHLORIDE 0.9 % IV BOLUS (SEPSIS): 1000.0000 mL | Freq: Once | INTRAVENOUS | Status: DC

## 2014-05-23 MED ORDER — LORAZEPAM 2 MG/ML IJ SOLN
INTRAMUSCULAR | Status: AC
  Filled 2014-05-23: qty 1

## 2014-05-23 MED ORDER — SODIUM CHLORIDE 0.9 % IV SOLN
INTRAVENOUS | Status: DC
  Administered 2014-05-24 – 2014-05-25 (×5): via INTRAVENOUS
  Filled 2014-05-23 (×10): qty 1000

## 2014-05-23 MED ORDER — ONDANSETRON HCL 4 MG/2ML IJ SOLN
4.0000 mg | Freq: Four times a day (QID) | INTRAMUSCULAR | Status: DC | PRN
  Administered 2014-06-09 – 2014-06-10 (×2): 4 mg via INTRAVENOUS
  Filled 2014-05-23 (×2): qty 2

## 2014-05-23 MED ORDER — ONDANSETRON HCL 4 MG PO TABS: 4.0000 mg | ORAL_TABLET | Freq: Four times a day (QID) | ORAL | Status: DC | PRN

## 2014-05-23 MED ORDER — IOHEXOL 300 MG/ML  SOLN
25.0000 mL | INTRAMUSCULAR | Status: AC
  Administered 2014-05-23 (×2): 25 mL via ORAL

## 2014-05-23 MED ORDER — PIPERACILLIN-TAZOBACTAM 3.375 G IVPB 30 MIN: 3.3750 g | Freq: Once | INTRAVENOUS | Status: DC

## 2014-05-23 MED ORDER — SODIUM CHLORIDE 0.9 % IJ SOLN
3.0000 mL | Freq: Two times a day (BID) | INTRAMUSCULAR | Status: DC
  Administered 2014-05-24 – 2014-06-09 (×21): 3 mL via INTRAVENOUS

## 2014-05-23 MED ORDER — DEXMETHYLPHENIDATE HCL ER 35 MG PO CP24: 35.0000 mg | ORAL_CAPSULE | Freq: Every morning | ORAL | Status: DC

## 2014-05-23 MED ORDER — ENOXAPARIN SODIUM 40 MG/0.4ML ~~LOC~~ SOLN
40.0000 mg | SUBCUTANEOUS | Status: DC
  Administered 2014-05-23 – 2014-05-25 (×3): 40 mg via SUBCUTANEOUS
  Filled 2014-05-23 (×3): qty 0.4

## 2014-05-23 MED ORDER — ACETAMINOPHEN 650 MG RE SUPP: 650.0000 mg | Freq: Four times a day (QID) | RECTAL | Status: DC | PRN

## 2014-05-23 MED ORDER — LORAZEPAM 2 MG/ML IJ SOLN
1.0000 mg | Freq: Once | INTRAMUSCULAR | Status: AC
  Administered 2014-05-23: 1 mg via INTRAVENOUS

## 2014-05-23 MED ORDER — VANCOMYCIN HCL IN DEXTROSE 1-5 GM/200ML-% IV SOLN
1000.0000 mg | Freq: Three times a day (TID) | INTRAVENOUS | Status: DC
  Administered 2014-05-23 – 2014-05-25 (×6): 1000 mg via INTRAVENOUS
  Filled 2014-05-23 (×7): qty 200

## 2014-05-23 NOTE — H&P (Addendum)
History and Physical  [name] ZOX:[id number] DOB: 03/31/1990 DOA: 05/08/2014   PCP: Zachery DakinsWILLIAMS,CLARENCE, MD   Chief Complaint: Altered mental status  HPI:  24 y.o. y/o male with a PMH of club foot, hemiatrophy of RLE, Klippel Feil Syndrome, scoliosis s/p harrington rod surgery (2003), Restrictive lung disease, & Tourette's syndrome / Tic disorder who [facility] with a 5 day history lethargy and increasing sleepiness. At baseline, the patient is able to perform all his activities of daily living with minimal to no assistance. He is able to ambulate without the assistance of any assistive devices. Apparently he visited his father on 05/22/2014. The patient was given an extra dose of Adderall on the evening of 05/22/2014 by accident. The patient normally takes his Adderall dose in the mornings. His mother did not give him his morning Adderall dose on 05/12/2014. Currently, the patient is somewhat encephalopathic, but he is awake and able to follow one-step commands. He does intermittently answer questions appropriately.. This history is obtained from review of the chart and speaking with the patient's mother at bedside. Over the past 5 days, the patient has had some drooling without any focal weakness. The patient did have a few episodes of vomitus 3 days prior to this admission. He has not vomited since then. There is no history of hematemesis, diarrhea, hematochezia, melena, dysuria, abdominal pain, chest discomfort, headaches. There has not been any unusual rashes. The patient has had a history of tracheostomy which has been removed last admission. In addition, the patient still has a gastrostomy tube. Apparently this was ligated when he was in the emergency department on 05/13/2014. The patient normally is able to eat by mouth. The patient visited the emergency department on 05/14/2014 because of agitation. The patient was started on Abilify, but the patient's mom  states that he is no longer taking this.  Notably, the patient was recently admitted to the hospital for approximately one month. He was discharged on 04/28/2014. His hospital stay was spent almost in the ICU and in its entirety. His hospital stay was complicated by tracheostomy placement which has been since removed. He also had respiratory failure secondary to ARDS. Also had severe agitation requiring frequent sedation. He was ultimately decannulated on 04/27/2014. His gastrostomy tube was placed in radiology on 04/16/2014.  In emergency department, the patient was noted to be hypothermic with a temperature 95.68F. He was initially tachycardic with a heart rate of 1:30. The patient was given 1 L normal saline with improvement of his heart rate into 120s. The patient's initial lactic acid was 4.23. It improved to 1.09 with fluid. Urinalysis was negative. Chest x-ray did not reveal any acute abnormalities but was poor quality due to the patient's body habitus. BMP was unremarkable. CBC showed white blood cell count 6.2. Otherwise is unremarkable. Hepatic panel unremarkable. EKG shows sinus tachycardia, T-wave inversion V4-V6 Assessment/Plan: Sepsis -Present at the time of admission -Suspect intra-abdominal source -IV vanco and zosyn -Blood cultures have been obtained in emergency department -Urinalysis negative for pyuria -Chest x-ray did not reveal any acute abnormalities but was poor quality due to the patient's body habitus -Give another liter fluid bolus -Continue maintenance IV fluids -Check a.m. Cortisol -will not restart clonidine at this time at hs (he uses  For sleep)--BP is soft -Serial lactate -Serial procalcitonin Acute encephalopathy -Likely due to infectious process -Am cortisol -TSH -If no improvement, the patient warrant further workup -UDS Mental retardation/Tourette's syndrome/tic disorder/ADD -Restart  Adderall and 05/24/2014  Abdominal pain  -CT abdomen and pelvis    -add lipase -add CT of the chest given the extreme poor quality of his chest x-ray and concerns for aspiration pneumonitis versus other pulmonary etiology for his fever        Past Medical History  Diagnosis Date  . Tourette's syndrome   . Tic disorder   . Scoliosis   . Hemiatrophy of right leg   . Club foot     Right  . Mental retardation     mild   Past Surgical History  Procedure Laterality Date  . Foot surgery Right 2002  . Eye muscle surgery Bilateral 1998 and 2002  . Femoral hernia repair      Done when he was an infant  . Harrington rod surgery  2003    For Scoliosis  . Tracheostomy tube placement N/A 04/07/2014    Procedure: TRACHEOSTOMY;  Surgeon: Flo Shanks, MD;  Location: [facility] OR;  Service: ENT;  Laterality: N/A;   Social History:  reports that he has never smoked. He has never used smokeless tobacco. He reports that he does not drink alcohol or use illicit drugs.   Family History  Problem Relation Age of Onset  . Breast cancer Maternal Grandmother     Died in her mid 73's  . Prostate cancer Maternal Grandfather     Died in his 22's  . Hypertension       No Known Allergies    Prior to Admission medications   Medication Sig Start Date End Date Taking? Authorizing Provider  cloNIDine (CATAPRES) 0.1 MG tablet TAKE 3 TABLETS BY MOUTH AT BEDTIME 04/29/14  Yes Deetta Perla, MD  Dexmethylphenidate HCl 35 MG CP24 Take 1 capsule every morning 05/03/14  Yes Princella Ion, NP    Review of Systems:  Constitutional:  No weight loss, night sweats, Fevers, chills, fatigue.  Head&Eyes: No headache.  No vision loss.  No eye pain or scotoma ENT:  No Difficulty swallowing,Tooth/dental problems,Sore throat,  No ear ache, post nasal drip,  Cardio-vascular:  No chest pain, Orthopnea, PND, swelling in lower extremities,  dizziness, palpitations  GI:  No  abdominal pain, nausea, vomiting, diarrhea, loss of appetite, hematochezia, melena, heartburn,  indigestion, Resp:  No shortness of breath with exertion or at rest. No cough. No coughing up of blood .No wheezing.No chest wall deformity  Skin:  no rash or lesions.  GU:  no dysuria, change in color of urine, no urgency or frequency. No flank pain.  Musculoskeletal:  No joint pain or swelling. No decreased range of motion. No back pain.  Psych:  No change in mood or affect. No depression or anxiety. Neurologic: No headache, no dysesthesia, no focal weakness, no vision loss. No syncope  Physical Exam: Filed Vitals:   06-07-2014 1259 Jun 07, 2014 1315 06-07-2014 1345 2014/06/07 1400  BP:   122/83 129/87  Pulse:  121 121 122  Temp:      TempSrc:      Resp:  28 33 37  Weight: 63.504 kg (140 lb)     SpO2:  97% 97% 96%   General:  Awake and alert, NAD, sick appearing, pleasant/cooperative Head/Eye: No conjunctival hemorrhage, no icterus, [place]/AT, No nystagmus ENT:  No icterus,  No thrush, good dentition, no pharyngeal exudate Neck:  No masses, no lymphadenpathy, no bruits CV:  RRR, no rub, no gallop, no S3 Lung:  Bibasilar crackles. No wheeze. Good air movement. Abdomen: soft/RUQ+RLQ abdominal pain, +BS, nondistended,  no peritoneal signs Ext: No cyanosis, No rashes, No petechiae, No lymphangitis, No edema; atrophy of the right lower extremity   Labs on Admission:  Basic Metabolic Panel:  Recent Labs Lab 2014-06-03 1030  NA 140  K 3.7  CL 101  CO2 26  GLUCOSE 126*  BUN 7  CREATININE 0.80  CALCIUM 9.2   Liver Function Tests:  Recent Labs Lab 06-03-2014 1030  AST 30  ALT 19  ALKPHOS 75  BILITOT 0.5  PROT 7.9  ALBUMIN 4.5   No results for input(s): LIPASE, AMYLASE in the last 168 hours. No results for input(s): AMMONIA in the last 168 hours. CBC:  Recent Labs Lab 06/03/14 1030  WBC 6.2  NEUTROABS 4.2  HGB 16.0  HCT 50.4  MCV 96.4  PLT 305   Cardiac Enzymes: No results for input(s): CKTOTAL, CKMB, CKMBINDEX, TROPONINI in the last 168 hours. BNP: Invalid  input(s): POCBNP CBG:  Recent Labs Lab June 03, 2014 1011  GLUCAP 113*    Radiological Exams on Admission: Dg Chest Port 1 View  06-03-2014   CLINICAL DATA:  Psychiatric eval. The adult accompanying hims states pt. Is "not responding (verbally) as much as usual. 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 He is awake and his eyes are open and he does follow with his gaze, however, he does not speak.  EXAM: PORTABLE CHEST - 1 VIEW  COMPARISON:  Chest CT and chest radiograph, 05/08/2014  FINDINGS: Moderate enlargement of the cardiopericardial silhouette, stable. No convincing mediastinal or hilar masses. Clear lungs. No convincing pleural effusion and no pneumothorax.  Chronic rib defects on the right. Stabilization rods extend throughout the thoracolumbar spine, also stable.  IMPRESSION: No acute cardiopulmonary disease. Stable appearance from the prior studies.   Electronically Signed   By: Amie Portland M.D.   On: 2014/06/03 10:51    EKG: Independently reviewed. Sinus tachycardia, T-wave inversion V4-V6    Time spent:70 minutes Code Status:   FULL Family Communication:   Mother updated at bedside   Shawniece Oyola, DO  [facility] Pager [phone]  If 7PM-7AM, please contact night-coverage [email or web address] Password [facility] 06-03-2014, 2:39 PM

## 2014-05-23 NOTE — ED Notes (Signed)
The adult accompanying hims states pt. Is "not responding (verbally) as much as usual.  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  He is awake and his eyes are open and he does follow with his gaze, however, he does not speak.  His skin is normal, warm and dry and he is breathing normally.

## 2014-05-23 NOTE — ED Notes (Signed)
Bair hugger borrowed from ICU. [name], sec from ICU aware.

## 2014-05-23 NOTE — Progress Notes (Signed)
Patient needs abd CT with contrast, patient not drinking contrast. NP on notified, orders to placed to insert NGT. Attempted placement of NGT with 2 other RNs. Patient did not tolerate insertion of NGT. Patient agreed to drink the contrast and finished one cup and is working on the second cup now. Will continue to encourage intake of contrast.

## 2014-05-23 NOTE — ED Notes (Signed)
Urinal placed at the bedside. Patient is aware that a urine sample is needed.

## 2014-05-23 NOTE — Progress Notes (Signed)
MD made aware via phone BP 138/105. Pulse 118. No acute changes noted with Pt's assessment at this time. Maintain current plan of care.

## 2014-05-23 NOTE — Progress Notes (Addendum)
ANTIBIOTIC CONSULT NOTE - INITIAL  Pharmacy Consult for Vancomycin, Zosyn Indication: rule out sepsis  No Known Allergies  Patient Measurements: Weight: 140 lb (63.504 kg) Height: 4'10"  Vital Signs: Temp: 97.8 F (36.6 C) (04/17 1056) Temp Source: Axillary (04/17 1056) BP: 109/67 mmHg (04/17 1145) Pulse Rate: 101 (04/17 1145) Intake/Output from previous day:   Intake/Output from this shift:    Labs:  Recent Labs  05/29/2014 1030  WBC 6.2  HGB 16.0  PLT 305  CREATININE 0.80   Estimated Creatinine Clearance: 106.8 mL/min (by C-G formula based on Cr of 0.8). No results for input(s): VANCOTROUGH, VANCOPEAK, VANCORANDOM, GENTTROUGH, GENTPEAK, GENTRANDOM, TOBRATROUGH, TOBRAPEAK, TOBRARND, AMIKACINPEAK, AMIKACINTROU, AMIKACIN in the last 72 hours.   Microbiology: No results found for this or any previous visit (from the past 720 hour(s)).  Medical History: Past Medical History  Diagnosis Date  . Tourette's syndrome   . Tic disorder   . Scoliosis   . Hemiatrophy of right leg   . Club foot     Right  . Mental retardation     mild    Assessment: [age] y/oM with PMH of Tourette's syndrome, tic disorder, scoliosis who [facility] with diminished responsiveness progressively worsening over past week. Patient recently seen in ED for aggressive behavior. Today, patient found to be hypothermic and tachycardic. Code sepsis initiated, and pharmacy consulted to assist with dosing of Vancomycin and Zosyn.  4/17 >> Vancomycin >> 4/17 >> Zosyn >>    4/17 blood x 2: sent 4/17 urine: sent  WBC: WNL Lactic Acid: 4.23 > 1.09 Renal: SCr 0.8, CrCl > 100 mL/min   Goal of Therapy:  Vancomycin trough level 15-20 mcg/ml  Appropriate antibiotic dosing for renal function and indication Eradication of infection  Plan:   Vancomycin 1g IV x 1 given in ED. Continue with Vancomycin 1g IV q8h.  Plan for Vancomycin trough level at steady state.  Zosyn 3.375g IV x 1 over 30 min  given in ED. Continue with Zosyn 3.375g IV q8h (infuse over 4 hours).  Monitor renal function, cultures, clinical course.   Greer PickerelJigna Ceazia Harb, PharmD, BCPS Pager: [phone] 05/10/2014 1:33 PM

## 2014-05-23 NOTE — ED Notes (Signed)
Will get 2nd set of blood cultures when pt calmed down.

## 2014-05-23 NOTE — Progress Notes (Signed)
Spoke with CT about amount of contrast the patient has been able to drink, said it should be ok to go ahead with CT of abd with one and a quarter cups drank. NP on call notified and orders for NGT D/C'd. Will transport patient to CT and continue to monitor.

## 2014-05-23 NOTE — ED Provider Notes (Signed)
CSN: [id number]     Arrival date & time Jun 05, 2014  0944 History   First MD Initiated Contact with Patient 2014/06/05 [phone]     Chief Complaint  Patient presents with  . Psychiatric Evaluation    Level V caveat altered mental status (Consider location/radiation/quality/duration/timing/severity/associated sxs/prior Treatment) HPI Patient with diminished responsiveness progressively worsening for possible past week his mother states that he's been less talkative, less responsive. He was able to walk into the hospital today. No known fever. Seen here 05/12/2014 for aggressive behavior. Psychiatry evaluated patient. Adjusted medications and patient released.  Past Medical History  Diagnosis Date  . Tourette's syndrome   . Tic disorder   . Scoliosis   . Hemiatrophy of right leg   . Club foot     Right  . Mental retardation     mild   Past Surgical History  Procedure Laterality Date  . Foot surgery Right 2002  . Eye muscle surgery Bilateral 1998 and 2002  . Femoral hernia repair      Done when he was an infant  . Harrington rod surgery  2003    For Scoliosis  . Tracheostomy tube placement N/A 04/07/2014    Procedure: TRACHEOSTOMY;  Surgeon: Flo Shanks, MD;  Location: [facility] OR;  Service: ENT;  Laterality: N/A;   Family History  Problem Relation Age of Onset  . Breast cancer Maternal Grandmother     Died in her mid 22's  . Prostate cancer Maternal Grandfather     Died in his 37's  . Hypertension     History  Substance Use Topics  . Smoking status: Never Smoker   . Smokeless tobacco: Never Used  . Alcohol Use: No    Review of Systems  Gastrointestinal:       G-tube in place not currently being used  All other systems reviewed and are negative.  review systems unobtainable, patient mentally retarded,    Allergies  Review of patient's allergies indicates no known allergies.  Home Medications   Prior to Admission medications   Medication Sig Start Date End Date Taking?  Authorizing Provider  cloNIDine (CATAPRES) 0.1 MG tablet TAKE 3 TABLETS BY MOUTH AT BEDTIME Patient not taking: Reported on 05/08/2014 03/11/14   Princella Ion, NP  cloNIDine (CATAPRES) 0.1 MG tablet TAKE 3 TABLETS BY MOUTH AT BEDTIME 04/29/14   Deetta Perla, MD  Dexmethylphenidate HCl 35 MG CP24 Take 1 capsule every morning 05/03/14   Princella Ion, NP   BP 147/110 mmHg  Pulse 138  Temp(Src) 95.3 F (35.2 C) (Rectal)  Resp 18  SpO2 99% Physical Exam  Constitutional: He appears well-developed and well-nourished. He appears distressed.  Chronically ill-appearing, agitated, grabbing at staff. Moves all extremities  HENT:  Head: Normocephalic and atraumatic.  Eyes: Conjunctivae are normal. Pupils are equal, round, and reactive to light.  Neck: Neck supple. No tracheal deviation present. No thyromegaly present.  Cardiovascular: Regular rhythm.   No murmur heard. Tachycardic  Pulmonary/Chest: Effort normal and breath sounds normal.  Abdominal: Soft. Bowel sounds are normal. He exhibits no distension. There is no tenderness.  G-tube in place, not red at site  Musculoskeletal: Normal range of motion. He exhibits no edema or tenderness.  Muscular atrophy right leg  Neurological: He is alert. Coordination normal.  Skin: Skin is warm and dry. No rash noted.  Psychiatric: He has a normal mood and affect.  Nursing note and vitals reviewed.   ED Course  Procedures (including critical care time)  Labs Review Labs Reviewed  CULTURE, BLOOD (ROUTINE X 2)  CULTURE, BLOOD (ROUTINE X 2)  URINE CULTURE  CBC WITH DIFFERENTIAL/PLATELET  COMPREHENSIVE METABOLIC PANEL  URINALYSIS, ROUTINE W REFLEX MICROSCOPIC  I-STAT CG4 LACTIC ACID, ED  CBG MONITORING, ED    Imaging Review No results found.   EKG Interpretation None     Bair hugger placed on patient 11:20 AM patient more calm after treatment with intravenous Ativan, intravenous fluids and Bair hugger placed on patient.  1:30  PM patient resting comfortably in bed alert. Intravenous antibiotics infused Talking with mother. Looks improved to her. Results for orders placed or performed during the hospital encounter of 05/09/2014  CBC WITH DIFFERENTIAL  Result Value Ref Range   WBC 6.2 4.0 - 10.5 K/uL   RBC 5.23 4.22 - 5.81 MIL/uL   Hemoglobin 16.0 13.0 - 17.0 g/dL   HCT 16.1 09.6 - 04.5 %   MCV 96.4 78.0 - 100.0 fL   MCH 30.6 26.0 - 34.0 pg   MCHC 31.7 30.0 - 36.0 g/dL   RDW 40.9 81.1 - 91.4 %   Platelets 305 150 - 400 K/uL   Neutrophils Relative % 68 43 - 77 %   Neutro Abs 4.2 1.7 - 7.7 K/uL   Lymphocytes Relative 15 12 - 46 %   Lymphs Abs 0.9 0.7 - 4.0 K/uL   Monocytes Relative 14 (H) 3 - 12 %   Monocytes Absolute 0.9 0.1 - 1.0 K/uL   Eosinophils Relative 2 0 - 5 %   Eosinophils Absolute 0.1 0.0 - 0.7 K/uL   Basophils Relative 1 0 - 1 %   Basophils Absolute 0.0 0.0 - 0.1 K/uL  Comprehensive metabolic panel  Result Value Ref Range   Sodium 140 135 - 145 mmol/L   Potassium 3.7 3.5 - 5.1 mmol/L   Chloride 101 96 - 112 mmol/L   CO2 26 19 - 32 mmol/L   Glucose, Bld 126 (H) 70 - 99 mg/dL   BUN 7 6 - 23 mg/dL   Creatinine, Ser 7.82 0.50 - 1.35 mg/dL   Calcium 9.2 8.4 - 95.6 mg/dL   Total Protein 7.9 6.0 - 8.3 g/dL   Albumin 4.5 3.5 - 5.2 g/dL   AST 30 0 - 37 U/L   ALT 19 0 - 53 U/L   Alkaline Phosphatase 75 39 - 117 U/L   Total Bilirubin 0.5 0.3 - 1.2 mg/dL   GFR calc non Af Amer >90 >90 mL/min   GFR calc Af Amer >90 >90 mL/min   Anion gap 13 5 - 15  I-Stat CG4 Lactic Acid, ED (not at [facility])  Result Value Ref Range   Lactic Acid, Venous 4.23 (HH) 0.5 - 2.0 mmol/L   Comment NOTIFIED PHYSICIAN   POC CBG, ED  Result Value Ref Range   Glucose-Capillary 113 (H) 70 - 99 mg/dL  I-Stat CG4 Lactic Acid, ED (not at [facility])  Result Value Ref Range   Lactic Acid, Venous 1.09 0.5 - 2.0 mmol/L   Dg Chest 2 View  05/08/2014   CLINICAL DATA:  Shortness of Breath  EXAM: CHEST  2 VIEW  COMPARISON:  04/27/2014   FINDINGS: Chronic changes in bony thorax are again noted as well as diffuse posterior fixation of the spine. Cardiac shadow remains enlarged. The lungs are clear bilaterally.  IMPRESSION: No acute abnormality noted.   Electronically Signed   By: Alcide Clever M.D.   On: 05/08/2014 15:27   Ct Angio Chest Pe W/cm &/or  Wo Cm  05/08/2014   CLINICAL DATA:  Acute onset of shortness of breath. Initial encounter.  EXAM: CT ANGIOGRAPHY CHEST WITH CONTRAST  TECHNIQUE: Multidetector CT imaging of the chest was performed using the standard protocol during bolus administration of intravenous contrast. Multiplanar CT image reconstructions and MIPs were obtained to evaluate the vascular anatomy.  CONTRAST:  75mL OMNIPAQUE IOHEXOL 350 MG/ML SOLN  COMPARISON:  Chest radiograph performed earlier today at 3:14 p.m., and CTA of the chest performed 03/31/2014  FINDINGS: There is no evidence of central pulmonary embolus.  Mild bibasilar atelectasis or scarring is noted. There is prominence of the pulmonary vasculature. The lungs are not well assessed due to motion artifact. A nodular opacity at the posterior right lung base is thought to reflect a vessel. There is no evidence of significant focal consolidation, pleural effusion or pneumothorax. No masses are identified; no abnormal focal contrast enhancement is seen.  The heart is significantly enlarged. No definite mediastinal lymphadenopathy is seen. No pericardial effusion is identified. The great vessels are grossly unremarkable in appearance. No axillary lymphadenopathy is seen. The visualized portions of the thyroid gland are unremarkable in appearance.  The visualized portions of the liver and spleen are unremarkable. The visualized portions of the pancreas, gallbladder, stomach, adrenal glands and kidneys are within normal limits.  No acute osseous abnormalities are seen. Chronic rib deformities are noted bilaterally. The patient is status post thoracolumbar spinal fusion.   Review of the MIP images confirms the above findings.  IMPRESSION: 1. No evidence of central pulmonary embolus. 2. Mild bibasilar atelectasis or scarring noted. Lungs not well assessed due to motion artifact. 3. Prominence of the pulmonary vasculature, and significant cardiomegaly.   Electronically Signed   By: Roanna RaiderJeffery  Chang M.D.   On: 05/08/2014 23:36   Dg Chest Port 1 View  05/18/2014   CLINICAL DATA:  Psychiatric eval. The adult accompanying hims states pt. Is "not responding (verbally) as much as usual. 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 He is awake and his eyes are open and he does follow with his gaze, however, he does not speak.  EXAM: PORTABLE CHEST - 1 VIEW  COMPARISON:  Chest CT and chest radiograph, 05/08/2014  FINDINGS: Moderate enlargement of the cardiopericardial silhouette, stable. No convincing mediastinal or hilar masses. Clear lungs. No convincing pleural effusion and no pneumothorax.  Chronic rib defects on the right. Stabilization rods extend throughout the thoracolumbar spine, also stable.  IMPRESSION: No acute cardiopulmonary disease. Stable appearance from the prior studies.   Electronically Signed   By: Amie Portlandavid  Ormond M.D.   On: 05/16/2014 10:51   Dg Chest Port 1 View  04/27/2014   CLINICAL DATA:  Hypoxemia, shortness of Breath  EXAM: PORTABLE CHEST - 1 VIEW  COMPARISON:  04/16/2014  FINDINGS: Cardiac shadow remains enlarged. Postsurgical changes in the thoracolumbar spine are seen. Changes in the chest wall are again noted and stable. No focal confluent infiltrate or sizable effusion is noted.  IMPRESSION: Chronic bony changes.  No acute abnormality noted.   Electronically Signed   By: Alcide CleverMark  Lukens M.D.   On: 04/27/2014 15:14   Chest x-ray viewed by me MDM  Code sepsis called based on Sirs criteria temperature and tachycardia. Source of possible infection of unknown. Lactic acidosis is resolving Final diagnoses:  None   spoke with Dr.TAT will arrange for  inpatient stay. Lactic acidosis is resolving. Suspected sepsis Diagnoses #1 acute encephalopathy. #2 hypothermia #3 lactic acidosis  CRITICAL CARE Performed  by: Lametria Klunk Total critical care time: 45 Critical care time was exclusive of separately billable procedures and treating other patients. Critical care was necessary to treat or prevent imminent or life-threatening deterioration. Critical care was time spent personally by me on the following activities: development of treatment plan with patient and/or surrogate as well as nursing, discussions with consultants, evaluation of patient's response to treatment, examination of patient, obtaining history from patient or surrogate, ordering and performing treatments and interventions, ordering and review of laboratory studies, ordering and review of radiographic studies, pulse oximetry and re-evaluation of patient's condition.  Doug Sou, MD 05/14/2014 1344

## 2014-05-23 NOTE — ED Notes (Signed)
Spoke with Jessica, charge RN 

## 2014-05-24 ENCOUNTER — Encounter (HOSPITAL_COMMUNITY): Payer: Self-pay

## 2014-05-24 DIAGNOSIS — J690 Pneumonitis due to inhalation of food and vomit: Secondary | ICD-10-CM

## 2014-05-24 DIAGNOSIS — K9423 Gastrostomy malfunction: Secondary | ICD-10-CM | POA: Insufficient documentation

## 2014-05-24 LAB — COMPREHENSIVE METABOLIC PANEL
ALBUMIN: 4.2 g/dL (ref 3.5–5.2)
ALK PHOS: 65 U/L (ref 39–117)
ALT: 18 U/L (ref 0–53)
AST: 27 U/L (ref 0–37)
Anion gap: 8 (ref 5–15)
BILIRUBIN TOTAL: 0.8 mg/dL (ref 0.3–1.2)
CHLORIDE: 102 mmol/L (ref 96–112)
CO2: 29 mmol/L (ref 19–32)
Calcium: 8.8 mg/dL (ref 8.4–10.5)
Creatinine, Ser: 0.81 mg/dL (ref 0.50–1.35)
GFR calc non Af Amer: >90 mL/min (ref >90)
Glucose, Bld: 101 mg/dL — ABNORMAL HIGH (ref 70–99)
POTASSIUM: 3.5 mmol/L (ref 3.5–5.1)
Sodium: 139 mmol/L (ref 135–145)
Total Protein: 7 g/dL (ref 6.0–8.3)

## 2014-05-24 LAB — RAPID URINE DRUG SCREEN, HOSP PERFORMED
Amphetamines: NOT DETECTED
BARBITURATES: NOT DETECTED
BENZODIAZEPINES: NOT DETECTED
COCAINE: NOT DETECTED
Opiates: NOT DETECTED
TETRAHYDROCANNABINOL: NOT DETECTED

## 2014-05-24 LAB — CBC
HCT: 47.4 % (ref 39.0–52.0)
Hemoglobin: 14.8 g/dL (ref 13.0–17.0)
MCH: 30 pg (ref 26.0–34.0)
MCHC: 31.2 g/dL (ref 30.0–36.0)
MCV: 96 fL (ref 78.0–100.0)
PLATELETS: 310 10*3/uL (ref 150–400)
RBC: 4.94 MIL/uL (ref 4.22–5.81)
RDW: 13.5 % (ref 11.5–15.5)
WBC: 5.9 10*3/uL (ref 4.0–10.5)

## 2014-05-24 LAB — URINE CULTURE
Colony Count: NO GROWTH
Culture: NO GROWTH

## 2014-05-24 MED ORDER — IOHEXOL 300 MG/ML  SOLN
100.0000 mL | Freq: Once | INTRAMUSCULAR | Status: AC | PRN
  Administered 2014-05-24: 100 mL via INTRAVENOUS

## 2014-05-24 MED ORDER — CLONIDINE HCL 0.3 MG PO TABS
0.3000 mg | ORAL_TABLET | Freq: Every day | ORAL | Status: DC
Start: 2014-05-24 — End: 2014-05-26
  Administered 2014-05-24 – 2014-05-25 (×2): 0.3 mg via ORAL
  Filled 2014-05-24 (×2): qty 1

## 2014-05-24 NOTE — Progress Notes (Signed)
PROGRESS NOTE  [name] JYN:[id number] DOB: 04/11/1990 DOA: 06/20/14 PCP: Zachery Dakins, MD  Brief history 24 y.o. y/o male with a PMH of club foot, hemiatrophy of RLE, Klippel Feil Syndrome, scoliosis s/p harrington rod surgery (2003), Restrictive lung disease, & Tourette's syndrome / Tic disorder who presented to [facility] on Jun 20, 2014 with a 5 day history lethargy and increasing sleepiness. Apparently he visited his father on 05/22/2014. The patient was given an extra dose of Adderall on the evening of 05/22/2014 by accident.  The patient was somewhat encephalopathic and not verbalizing much in the emergency department. The patient also had a few episodes of emesis prior to admission. He was hypothermic with soft blood pressures in the emergency department. The patient was admitted for sepsis workup. CT of the chest revealed bilateral patchy airspace disease in the right lower lobe and left lower lobe. CT of the abdomen and pelvis was essentially unremarkable. The patient was started on vancomycin and Zosyn after blood cultures and urine cultures were obtained. Assessment/Plan: Sepsis -Present at the time of admission -due to HCAP/Aspiration -Continue IV vanco and zosyn pending culture data -Urinalysis negative for pyuria -Chest x-ray did not reveal any acute abnormalities but was poor quality due to the patient's body habitus -Continue maintenance IV fluids -Check a.m. Cortisol--pending -will restart clonidine at this time at hs (he uses For sleep) -Serial lactate 4.23-->1.09 -Serial procalcitonin <0.10 HCAP/Aspiration pneumonitis -CT of the chest revealed bilateral patchy airspace disease in the right lower lobe and left lower lobe -will tx despite being afebrile as pt has propensity for rapid deterioration and difficult intubation -continue zosyn -pulmonary hygiene -speech eval last admission-->regular diet -pt had vomiting this week which may have  resulted in aspiration  Acute encephalopathy -Likely due to infectious process -05/24/2014--more alert, speaking more today, but not at baseline -Am cortisol--pending -TSH--1.679 -If no improvement, the patient warrant further workup -UDS--negative -Serum B12--678 --will restart clonidine at this time at hs (he uses For sleep) Mental retardation/Tourette's syndrome/tic disorder/ADD -Hold Adderall  Abdominal pain  -improved on 05/24/14--wants to eat -05/24/14--advance to full liquids -CT abdomen and pelvis--neg for acute findings  -lipase--53 -consulted IR for PEG removal   Family Communication:   Mother updated on phone 05/24/14 Disposition Plan:   Home when medically stable       Procedures/Studies: Dg Chest 2 View  05/08/2014   CLINICAL DATA:  Shortness of Breath  EXAM: CHEST  2 VIEW  COMPARISON:  04/27/2014  FINDINGS: Chronic changes in bony thorax are again noted as well as diffuse posterior fixation of the spine. Cardiac shadow remains enlarged. The lungs are clear bilaterally.  IMPRESSION: No acute abnormality noted.   Electronically Signed   By: Alcide Clever M.D.   On: 05/08/2014 15:27   Ct Chest W Contrast  05/24/2014   CLINICAL DATA:  Initial encounter for 3 day history of nausea and vomiting with sepsis and abdominal pain.  EXAM: CT CHEST, ABDOMEN, AND PELVIS WITH CONTRAST  TECHNIQUE: Multidetector CT imaging of the chest, abdomen and pelvis was performed following the standard protocol during bolus administration of intravenous contrast.  CONTRAST:  OMNIPAQUE IOHEXOL 300 MG/ML  SOLN  COMPARISON:  CT chest from 05/08/2014.  FINDINGS: CT CHEST FINDINGS  Mediastinum/Nodes: There is no axillary lymphadenopathy. No mediastinal or hilar lymphadenopathy. The cardio pericardial silhouette is enlarged. No pericardial effusion.  Lungs/Pleura: Assessment of the lung parenchyma is limited by breathing motion. Patchy airspace disease in  the right lower lobe may be related  atelectasis or infection. There is collapse/ consolidation in the left lower lobe. No pleural effusion.  Musculoskeletal: Thoracolumbar scoliosis noted with evidence of prior spinal fusion.  CT ABDOMEN AND PELVIS FINDINGS  Hepatobiliary: No focal abnormality within the liver parenchyma. There is no evidence for gallstones, gallbladder wall thickening, or pericholecystic fluid. No intrahepatic or extrahepatic biliary dilation.  Pancreas: No focal mass lesion. No dilatation of the main duct. No intraparenchymal cyst. No peripancreatic edema.  Spleen: No splenomegaly. No focal mass lesion.  Adrenals/Urinary Tract: No adrenal nodule or mass. No hydronephrosis. No focal enhancing lesion in either kidney. Beam hardening artifact is identified in the posterior aspect of both kidneys. No evidence for hydroureter. Bladder is mildly distended.  Stomach/Bowel: Gastrostomy tube is visualized in since 2. Duodenum is normally positioned as is the ligament of Treitz. Assessment of small bowel is limited by patient motion. No evidence for small bowel obstruction. Neither the terminal ileum nor the appendix are visualized due to motion artifact. No evidence for gross colonic wall thickening or colonic obstruction.  Vascular/Lymphatic: No abdominal aortic aneurysm. No bulky lymphadenopathy in the abdomen although, again, motion artifact hinders assessment. No evidence for pelvic sidewall lymphadenopathy.  Reproductive: Prostate gland and seminal vesicles are unremarkable.  Other: No intraperitoneal free fluid.  Musculoskeletal: Thoracolumbar scoliosis noted with extensive thoracolumbar fusion.  IMPRESSION: 1. Bilateral lower lobe atelectasis or pneumonia. 2. Otherwise no acute findings in this motion degraded study.   Electronically Signed   By: Kennith Center M.D.   On: 05/24/2014 08:10   Ct Angio Chest Pe W/cm &/or Wo Cm  05/08/2014   CLINICAL DATA:  Acute onset of shortness of breath. Initial encounter.  EXAM: CT ANGIOGRAPHY  CHEST WITH CONTRAST  TECHNIQUE: Multidetector CT imaging of the chest was performed using the standard protocol during bolus administration of intravenous contrast. Multiplanar CT image reconstructions and MIPs were obtained to evaluate the vascular anatomy.  CONTRAST:  75mL OMNIPAQUE IOHEXOL 350 MG/ML SOLN  COMPARISON:  Chest radiograph performed earlier today at 3:14 p.m., and CTA of the chest performed 03/31/2014  FINDINGS: There is no evidence of central pulmonary embolus.  Mild bibasilar atelectasis or scarring is noted. There is prominence of the pulmonary vasculature. The lungs are not well assessed due to motion artifact. A nodular opacity at the posterior right lung base is thought to reflect a vessel. There is no evidence of significant focal consolidation, pleural effusion or pneumothorax. No masses are identified; no abnormal focal contrast enhancement is seen.  The heart is significantly enlarged. No definite mediastinal lymphadenopathy is seen. No pericardial effusion is identified. The great vessels are grossly unremarkable in appearance. No axillary lymphadenopathy is seen. The visualized portions of the thyroid gland are unremarkable in appearance.  The visualized portions of the liver and spleen are unremarkable. The visualized portions of the pancreas, gallbladder, stomach, adrenal glands and kidneys are within normal limits.  No acute osseous abnormalities are seen. Chronic rib deformities are noted bilaterally. The patient is status post thoracolumbar spinal fusion.  Review of the MIP images confirms the above findings.  IMPRESSION: 1. No evidence of central pulmonary embolus. 2. Mild bibasilar atelectasis or scarring noted. Lungs not well assessed due to motion artifact. 3. Prominence of the pulmonary vasculature, and significant cardiomegaly.   Electronically Signed   By: Roanna Raider M.D.   On: 05/08/2014 23:36   Ct Abdomen Pelvis W Contrast  05/24/2014   CLINICAL DATA:  Initial  encounter for 3 day history of nausea and vomiting with sepsis and abdominal pain.  EXAM: CT CHEST, ABDOMEN, AND PELVIS WITH CONTRAST  TECHNIQUE: Multidetector CT imaging of the chest, abdomen and pelvis was performed following the standard protocol during bolus administration of intravenous contrast.  CONTRAST:  100mL OMNIPAQUE IOHEXOL 300 MG/ML  SOLN  COMPARISON:  CT chest from 05/08/2014.  FINDINGS: CT CHEST FINDINGS  Mediastinum/Nodes: There is no axillary lymphadenopathy. No mediastinal or hilar lymphadenopathy. The cardio pericardial silhouette is enlarged. No pericardial effusion.  Lungs/Pleura: Assessment of the lung parenchyma is limited by breathing motion. Patchy airspace disease in the right lower lobe may be related atelectasis or infection. There is collapse/ consolidation in the left lower lobe. No pleural effusion.  Musculoskeletal: Thoracolumbar scoliosis noted with evidence of prior spinal fusion.  CT ABDOMEN AND PELVIS FINDINGS  Hepatobiliary: No focal abnormality within the liver parenchyma. There is no evidence for gallstones, gallbladder wall thickening, or pericholecystic fluid. No intrahepatic or extrahepatic biliary dilation.  Pancreas: No focal mass lesion. No dilatation of the main duct. No intraparenchymal cyst. No peripancreatic edema.  Spleen: No splenomegaly. No focal mass lesion.  Adrenals/Urinary Tract: No adrenal nodule or mass. No hydronephrosis. No focal enhancing lesion in either kidney. Beam hardening artifact is identified in the posterior aspect of both kidneys. No evidence for hydroureter. Bladder is mildly distended.  Stomach/Bowel: Gastrostomy tube is visualized in since 2. Duodenum is normally positioned as is the ligament of Treitz. Assessment of small bowel is limited by patient motion. No evidence for small bowel obstruction. Neither the terminal ileum nor the appendix are visualized due to motion artifact. No evidence for gross colonic wall thickening or colonic  obstruction.  Vascular/Lymphatic: No abdominal aortic aneurysm. No bulky lymphadenopathy in the abdomen although, again, motion artifact hinders assessment. No evidence for pelvic sidewall lymphadenopathy.  Reproductive: Prostate gland and seminal vesicles are unremarkable.  Other: No intraperitoneal free fluid.  Musculoskeletal: Thoracolumbar scoliosis noted with extensive thoracolumbar fusion.  IMPRESSION: 1. Bilateral lower lobe atelectasis or pneumonia. 2. Otherwise no acute findings in this motion degraded study.   Electronically Signed   By: Kennith CenterEric  Mansell M.D.   On: 05/24/2014 08:10   Dg Chest Port 1 View  08/24/14   CLINICAL DATA:  Psychiatric eval. The adult accompanying hims states pt. Is "not responding (verbally) as much as usual. 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 He is awake and his eyes are open and he does follow with his gaze, however, he does not speak.  EXAM: PORTABLE CHEST - 1 VIEW  COMPARISON:  Chest CT and chest radiograph, 05/08/2014  FINDINGS: Moderate enlargement of the cardiopericardial silhouette, stable. No convincing mediastinal or hilar masses. Clear lungs. No convincing pleural effusion and no pneumothorax.  Chronic rib defects on the right. Stabilization rods extend throughout the thoracolumbar spine, also stable.  IMPRESSION: No acute cardiopulmonary disease. Stable appearance from the prior studies.   Electronically Signed   By: Amie Portlandavid  Ormond M.D.   On: 007/19/16 10:51   Dg Chest Port 1 View  04/27/2014   CLINICAL DATA:  Hypoxemia, shortness of Breath  EXAM: PORTABLE CHEST - 1 VIEW  COMPARISON:  04/16/2014  FINDINGS: Cardiac shadow remains enlarged. Postsurgical changes in the thoracolumbar spine are seen. Changes in the chest wall are again noted and stable. No focal confluent infiltrate or sizable effusion is noted.  IMPRESSION: Chronic bony changes.  No acute abnormality noted.   Electronically Signed   By: Alcide CleverMark  Lukens  M.D.   On: 04/27/2014 15:14          Subjective: Patient is more alert today. He is answering questions in 2-3 word sentences. Denies any chest pain, shortness breath, abdominal pain. He wants to eat. Denies any diarrhea. No reports of uncontrolled pain, vomiting, diarrhea.  Objective: Filed Vitals:   05/28/2014 1619 05/07/2014 1800 05/24/14 0513 05/24/14 0900  BP: 145/101 138/105 136/90 144/95  Pulse: 126 118 118 116  Temp: 98.4 F (36.9 C) 98.2 F (36.8 C) 97.5 F (36.4 C) 98.2 F (36.8 C)  TempSrc: Oral Oral Axillary Oral  Resp:   24 22  Height:  (1.448 m)     Weight: 58.968 kg (130 lb)     SpO2: 97% 98% 95% 97%    Intake/Output Summary (Last 24 hours) at 05/24/14 1147 Last data filed at 05/24/14 1012  Gross per 24 hour  Intake   2160 ml  Output   1795 ml  Net    365 ml   Weight change:  Exam:   General:  Pt is alert, follows commands appropriately, not in acute distress  HEENT: No icterus, No thrush,  [place]/AT  Cardiovascular: RRR, S1/S2, no rubs, no gallops  Respiratory: Poor inspiratory effort. Bibasilar rales. No wheezing.  Abdomen: Soft/+BS, non tender, non distended, no guarding  Extremities: No edema, No lymphangitis, No petechiae, No rashes, no synovitis  Data Reviewed: Basic Metabolic Panel:  Recent Labs Lab 05/22/2014 1030 05/24/14 0518  NA 140 139  K 3.7 3.5  CL 101 102  CO2 26 29  GLUCOSE 126* 101*  BUN 7 <5*  CREATININE 0.80 0.81  CALCIUM 9.2 8.8   Liver Function Tests:  Recent Labs Lab 06/03/2014 1030 05/24/14 0518  AST 30 27  ALT 19 18  ALKPHOS 75 65  BILITOT 0.5 0.8  PROT 7.9 7.0  ALBUMIN 4.5 4.2    Recent Labs Lab 05/10/2014 1616  LIPASE 53   No results for input(s): AMMONIA in the last 168 hours. CBC:  Recent Labs Lab 06/01/2014 1030 05/24/14 0518  WBC 6.2 5.9  NEUTROABS 4.2  --   HGB 16.0 14.8  HCT 50.4 47.4  MCV 96.4 96.0  PLT 305 310   Cardiac Enzymes: No results for input(s): CKTOTAL, CKMB, CKMBINDEX, TROPONINI in the last 168  hours. BNP: Invalid input(s): POCBNP CBG:  Recent Labs Lab 05/08/2014 1011  GLUCAP 113*    Recent Results (from the past 240 hour(s))  Blood Culture (routine x 2)     Status: None (Preliminary result)   Collection Time: 05/14/2014 10:30 AM  Result Value Ref Range Status   Specimen Description BLOOD RIGHT HAND  3 ML IN [facility] BOTTLE  Final   Special Requests NONE  Final   Culture   Final           BLOOD CULTURE RECEIVED NO GROWTH TO DATE CULTURE WILL BE HELD FOR 5 DAYS BEFORE ISSUING A FINAL NEGATIVE REPORT Performed at [facility]    Report Status PENDING  Incomplete  Blood Culture (routine x 2)     Status: None (Preliminary result)   Collection Time: 05/25/2014 11:02 AM  Result Value Ref Range Status   Specimen Description BLOOD RIGHT HAND  Final   Special Requests BOTTLES DRAWN AEROBIC AND ANAEROBIC 5 CC EACH  Final   Culture   Final           BLOOD CULTURE RECEIVED NO GROWTH TO DATE CULTURE WILL BE HELD FOR 5 DAYS BEFORE  ISSUING A FINAL NEGATIVE REPORT Performed at [facility]    Report Status PENDING  Incomplete     Scheduled Meds: . Dexmethylphenidate HCl  35 mg Oral q morning - 10a  . enoxaparin (LOVENOX) injection  40 mg Subcutaneous Q24H  . piperacillin-tazobactam (ZOSYN)  IV  3.375 g Intravenous 3 times per day  . sodium chloride  1,000 mL Intravenous Once  . sodium chloride  3 mL Intravenous Q12H  . vancomycin  1,000 mg Intravenous Q8H   Continuous Infusions: . sodium chloride 0.9 % 1,000 mL with potassium chloride 20 mEq infusion 100 mL/hr at 05/24/14 1012     Neenah Canter, DO  [facility] Pager [phone]  If 7PM-7AM, please contact night-coverage [email or web address] Password [email or web address] 05/24/2014, 11:47 AM   LOS: 1 day

## 2014-05-24 NOTE — Progress Notes (Signed)
Spoke to pt's mother by phone. Questions answered, updated for pt current condition. Per pharmacy, pt will need to provide his own Focalin. Pt's mother aware and will bring in this evening and notify staff when it arrives.

## 2014-05-24 NOTE — Progress Notes (Signed)
IR PA aware of request for percutaneous gastrostomy tube removal. The patient had his g-tube placed 3/11 and removal requires 6-8 weeks until tract is healed and safe for removal to decrease bleeding risk. The [facility]  Pattricia BossKoreen Shacoria Latif PA-C Interventional Radiology  05/24/14  12:56 PM

## 2014-05-25 LAB — CBC
HCT: 46.1 % (ref 39.0–52.0)
Hemoglobin: 14 g/dL (ref 13.0–17.0)
MCH: 29.5 pg (ref 26.0–34.0)
MCHC: 30.4 g/dL (ref 30.0–36.0)
MCV: 97.3 fL (ref 78.0–100.0)
Platelets: 297 10*3/uL (ref 150–400)
RBC: 4.74 MIL/uL (ref 4.22–5.81)
RDW: 13.3 % (ref 11.5–15.5)
WBC: 6.8 10*3/uL (ref 4.0–10.5)

## 2014-05-25 LAB — CORTISOL-AM, BLOOD: CORTISOL - AM: 10.6 ug/dL (ref 4.3–22.4)

## 2014-05-25 LAB — BASIC METABOLIC PANEL
ANION GAP: 11 (ref 5–15)
BUN: 6 mg/dL (ref 6–23)
CO2: 23 mmol/L (ref 19–32)
Calcium: 8.8 mg/dL (ref 8.4–10.5)
Chloride: 106 mmol/L (ref 96–112)
Creatinine, Ser: 1.75 mg/dL — ABNORMAL HIGH (ref 0.50–1.35)
GFR calc Af Amer: 62 mL/min — ABNORMAL LOW (ref >90)
GFR, EST NON AFRICAN AMERICAN: 53 mL/min — AB (ref >90)
Glucose, Bld: 131 mg/dL — ABNORMAL HIGH (ref 70–99)
POTASSIUM: 4.1 mmol/L (ref 3.5–5.1)
Sodium: 140 mmol/L (ref 135–145)

## 2014-05-25 LAB — VANCOMYCIN, TROUGH: Vancomycin Tr: 61.5 ug/mL (ref 10.0–20.0)

## 2014-05-25 MED ORDER — METOPROLOL TARTRATE 12.5 MG HALF TABLET
12.5000 mg | ORAL_TABLET | Freq: Every day | ORAL | Status: DC
  Administered 2014-05-26 – 2014-05-29 (×2): 12.5 mg via ORAL
  Filled 2014-05-25 (×3): qty 1

## 2014-05-25 NOTE — Progress Notes (Signed)
PROGRESS NOTE  [name] ZOX:[id number] DOB: 1990-11-01 DOA: 05/18/2014 PCP: Zachery Dakins, MD   Brief history 24 y.o. y/o male with a PMH of club foot, hemiatrophy of RLE, Klippel Feil Syndrome, scoliosis s/p harrington rod surgery (2003), Restrictive lung disease, & Tourette's syndrome / Tic disorder who presented to [facility] on 05/28/2014 with a 5 day history lethargy and increasing sleepiness. Apparently he visited his father on 05/22/2014. The patient was given an extra dose of Adderall on the evening of 05/22/2014 by accident. The patient was somewhat encephalopathic and not verbalizing much in the emergency department. The patient also had a few episodes of emesis prior to admission. He was hypothermic with soft blood pressures in the emergency department. The patient was admitted for sepsis workup. CT of the chest revealed bilateral patchy airspace disease in the right lower lobe and left lower lobe. CT of the abdomen and pelvis was essentially unremarkable. The patient was started on vancomycin and Zosyn after blood cultures and urine cultures were obtained. Assessment/Plan: Sepsis -Present at the time of admission -due to HCAP/Aspiration -Continue IV zosyn pending culture data -D/C vanco altogether--level was 61.5 on 4/19 and it was held -Urinalysis negative for pyuria -Chest x-ray did not reveal any acute abnormalities but was poor quality due to the patient's body habitus -Continue maintenance IV fluids -Check a.m. Cortisol--10.6 -will restart clonidine at this time at hs (he uses For sleep) -Serial lactate 4.23-->1.09 -Serial procalcitonin <0.10 HCAP/Aspiration pneumonitis -CT of the chest revealed bilateral patchy airspace disease in the right lower lobe and left lower lobe -will tx despite being afebrile as pt has propensity for rapid deterioration and difficult intubation -continue zosyn-->clinically improving -pulmonary hygiene -speech eval last  admission-->regular diet -pt had vomiting this week which may have resulted in aspiration  -05/24/2014 blood cultures remain negative Acute encephalopathy -continues to improve -Likely due to infectious process -05/25/2014--near baseline -TSH--1.679 -UDS--negative -Serum B12--678 --will restart clonidine at this time at hs (he uses For sleep) AKI -secondary to contrast nephropathy +/- Vanco -received IV contrast 05/24/14 -Continue IV fluids -Avoid nephrotoxins Mental retardation/Tourette's syndrome/tic disorder/ADD -Hold Adderall--doing well of of this Abdominal pain  -improved on 05/24/14--wants to eat -05/24/14--advance to full liquids -CT abdomen and pelvis--neg for acute findings  -lipase--53 -consulted IR for PEG removal (placed 04/16/14)-->already scheduled for G-tube removal on 4/22 at [facility] -if pt remains in hospital--will need to reconsult IR Sinus tachycardia -Secondary to acute medical illness -Gradually improving -Continue IV fluids   Disposition: Renal function improvement and continue mental status improvement; expect to 3 days Family communication--discussed with grandmother at bedside, mother on found; total time 35 min; >50% spent counseling and coordinating care    Procedures/Studies: Dg Chest 2 View  05/08/2014   CLINICAL DATA:  Shortness of Breath  EXAM: CHEST  2 VIEW  COMPARISON:  04/27/2014  FINDINGS: Chronic changes in bony thorax are again noted as well as diffuse posterior fixation of the spine. Cardiac shadow remains enlarged. The lungs are clear bilaterally.  IMPRESSION: No acute abnormality noted.   Electronically Signed   By: Alcide Clever M.D.   On: 05/08/2014 15:27   Ct Chest W Contrast  05/24/2014   CLINICAL DATA:  Initial encounter for 3 day history of nausea and vomiting with sepsis and abdominal pain.  EXAM: CT CHEST, ABDOMEN, AND PELVIS WITH CONTRAST  TECHNIQUE: Multidetector CT imaging of the chest, abdomen and pelvis was performed following  the standard protocol during  bolus administration of intravenous contrast.  CONTRAST:  OMNIPAQUE IOHEXOL 300 MG/ML  SOLN  COMPARISON:  CT chest from 05/08/2014.  FINDINGS: CT CHEST FINDINGS  Mediastinum/Nodes: There is no axillary lymphadenopathy. No mediastinal or hilar lymphadenopathy. The cardio pericardial silhouette is enlarged. No pericardial effusion.  Lungs/Pleura: Assessment of the lung parenchyma is limited by breathing motion. Patchy airspace disease in the right lower lobe may be related atelectasis or infection. There is collapse/ consolidation in the left lower lobe. No pleural effusion.  Musculoskeletal: Thoracolumbar scoliosis noted with evidence of prior spinal fusion.  CT ABDOMEN AND PELVIS FINDINGS  Hepatobiliary: No focal abnormality within the liver parenchyma. There is no evidence for gallstones, gallbladder wall thickening, or pericholecystic fluid. No intrahepatic or extrahepatic biliary dilation.  Pancreas: No focal mass lesion. No dilatation of the main duct. No intraparenchymal cyst. No peripancreatic edema.  Spleen: No splenomegaly. No focal mass lesion.  Adrenals/Urinary Tract: No adrenal nodule or mass. No hydronephrosis. No focal enhancing lesion in either kidney. Beam hardening artifact is identified in the posterior aspect of both kidneys. No evidence for hydroureter. Bladder is mildly distended.  Stomach/Bowel: Gastrostomy tube is visualized in since 2. Duodenum is normally positioned as is the ligament of Treitz. Assessment of small bowel is limited by patient motion. No evidence for small bowel obstruction. Neither the terminal ileum nor the appendix are visualized due to motion artifact. No evidence for gross colonic wall thickening or colonic obstruction.  Vascular/Lymphatic: No abdominal aortic aneurysm. No bulky lymphadenopathy in the abdomen although, again, motion artifact hinders assessment. No evidence for pelvic sidewall lymphadenopathy.  Reproductive: Prostate  gland and seminal vesicles are unremarkable.  Other: No intraperitoneal free fluid.  Musculoskeletal: Thoracolumbar scoliosis noted with extensive thoracolumbar fusion.  IMPRESSION: 1. Bilateral lower lobe atelectasis or pneumonia. 2. Otherwise no acute findings in this motion degraded study.   Electronically Signed   By: Kennith Center M.D.   On: 05/24/2014 08:10   Ct Angio Chest Pe W/cm &/or Wo Cm  05/08/2014   CLINICAL DATA:  Acute onset of shortness of breath. Initial encounter.  EXAM: CT ANGIOGRAPHY CHEST WITH CONTRAST  TECHNIQUE: Multidetector CT imaging of the chest was performed using the standard protocol during bolus administration of intravenous contrast. Multiplanar CT image reconstructions and MIPs were obtained to evaluate the vascular anatomy.  CONTRAST:  75mL OMNIPAQUE IOHEXOL 350 MG/ML SOLN  COMPARISON:  Chest radiograph performed earlier today at 3:14 p.m., and CTA of the chest performed 03/31/2014  FINDINGS: There is no evidence of central pulmonary embolus.  Mild bibasilar atelectasis or scarring is noted. There is prominence of the pulmonary vasculature. The lungs are not well assessed due to motion artifact. A nodular opacity at the posterior right lung base is thought to reflect a vessel. There is no evidence of significant focal consolidation, pleural effusion or pneumothorax. No masses are identified; no abnormal focal contrast enhancement is seen.  The heart is significantly enlarged. No definite mediastinal lymphadenopathy is seen. No pericardial effusion is identified. The great vessels are grossly unremarkable in appearance. No axillary lymphadenopathy is seen. The visualized portions of the thyroid gland are unremarkable in appearance.  The visualized portions of the liver and spleen are unremarkable. The visualized portions of the pancreas, gallbladder, stomach, adrenal glands and kidneys are within normal limits.  No acute osseous abnormalities are seen. Chronic rib deformities are  noted bilaterally. The patient is status post thoracolumbar spinal fusion.  Review of the MIP images confirms the above findings.  IMPRESSION: 1. No evidence of central pulmonary embolus. 2. Mild bibasilar atelectasis or scarring noted. Lungs not well assessed due to motion artifact. 3. Prominence of the pulmonary vasculature, and significant cardiomegaly.   Electronically Signed   By: Roanna Raider M.D.   On: 05/08/2014 23:36   Ct Abdomen Pelvis W Contrast  05/24/2014   CLINICAL DATA:  Initial encounter for 3 day history of nausea and vomiting with sepsis and abdominal pain.  EXAM: CT CHEST, ABDOMEN, AND PELVIS WITH CONTRAST  TECHNIQUE: Multidetector CT imaging of the chest, abdomen and pelvis was performed following the standard protocol during bolus administration of intravenous contrast.  CONTRAST:  OMNIPAQUE IOHEXOL 300 MG/ML  SOLN  COMPARISON:  CT chest from 05/08/2014.  FINDINGS: CT CHEST FINDINGS  Mediastinum/Nodes: There is no axillary lymphadenopathy. No mediastinal or hilar lymphadenopathy. The cardio pericardial silhouette is enlarged. No pericardial effusion.  Lungs/Pleura: Assessment of the lung parenchyma is limited by breathing motion. Patchy airspace disease in the right lower lobe may be related atelectasis or infection. There is collapse/ consolidation in the left lower lobe. No pleural effusion.  Musculoskeletal: Thoracolumbar scoliosis noted with evidence of prior spinal fusion.  CT ABDOMEN AND PELVIS FINDINGS  Hepatobiliary: No focal abnormality within the liver parenchyma. There is no evidence for gallstones, gallbladder wall thickening, or pericholecystic fluid. No intrahepatic or extrahepatic biliary dilation.  Pancreas: No focal mass lesion. No dilatation of the main duct. No intraparenchymal cyst. No peripancreatic edema.  Spleen: No splenomegaly. No focal mass lesion.  Adrenals/Urinary Tract: No adrenal nodule or mass. No hydronephrosis. No focal enhancing lesion in either  kidney. Beam hardening artifact is identified in the posterior aspect of both kidneys. No evidence for hydroureter. Bladder is mildly distended.  Stomach/Bowel: Gastrostomy tube is visualized in since 2. Duodenum is normally positioned as is the ligament of Treitz. Assessment of small bowel is limited by patient motion. No evidence for small bowel obstruction. Neither the terminal ileum nor the appendix are visualized due to motion artifact. No evidence for gross colonic wall thickening or colonic obstruction.  Vascular/Lymphatic: No abdominal aortic aneurysm. No bulky lymphadenopathy in the abdomen although, again, motion artifact hinders assessment. No evidence for pelvic sidewall lymphadenopathy.  Reproductive: Prostate gland and seminal vesicles are unremarkable.  Other: No intraperitoneal free fluid.  Musculoskeletal: Thoracolumbar scoliosis noted with extensive thoracolumbar fusion.  IMPRESSION: 1. Bilateral lower lobe atelectasis or pneumonia. 2. Otherwise no acute findings in this motion degraded study.   Electronically Signed   By: Kennith Center M.D.   On: 05/24/2014 08:10   Dg Chest Port 1 View  06/05/2014   CLINICAL DATA:  Psychiatric eval. The adult accompanying hims states pt. Is "not responding (verbally) as much as usual. 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 He is awake and his eyes are open and he does follow with his gaze, however, he does not speak.  EXAM: PORTABLE CHEST - 1 VIEW  COMPARISON:  Chest CT and chest radiograph, 05/08/2014  FINDINGS: Moderate enlargement of the cardiopericardial silhouette, stable. No convincing mediastinal or hilar masses. Clear lungs. No convincing pleural effusion and no pneumothorax.  Chronic rib defects on the right. Stabilization rods extend throughout the thoracolumbar spine, also stable.  IMPRESSION: No acute cardiopulmonary disease. Stable appearance from the prior studies.   Electronically Signed   By: Amie Portland M.D.   On: 05/15/2014 10:51    Dg Chest Port 1 View  04/27/2014   CLINICAL DATA:  Hypoxemia, shortness of Breath  EXAM: PORTABLE CHEST - 1 VIEW  COMPARISON:  04/16/2014  FINDINGS: Cardiac shadow remains enlarged. Postsurgical changes in the thoracolumbar spine are seen. Changes in the chest wall are again noted and stable. No focal confluent infiltrate or sizable effusion is noted.  IMPRESSION: Chronic bony changes.  No acute abnormality noted.   Electronically Signed   By: Alcide Clever M.D.   On: 04/27/2014 15:14         Subjective: Patient denies fevers, chills, chest pain, shortness of breath, vomiting, diarrhea, abdominal pain.  Objective: Filed Vitals:   05/24/14 1454 05/24/14 2306 05/25/14 0636 05/25/14 1514  BP: 145/97 126/90 123/86 137/103  Pulse: 110 104 103   Temp:  98.3 F (36.8 C) 97.4 F (36.3 C) 98.3 F (36.8 C)  TempSrc:  Axillary Axillary Oral  Resp:  Height:      Weight:      SpO2:  92% 97% 97%    Intake/Output Summary (Last 24 hours) at 05/25/14 1701 Last data filed at 05/25/14 1552  Gross per 24 hour  Intake   4200 ml  Output   1080 ml  Net   3120 ml   Weight change:  Exam:   General:  Pt is alert, follows commands appropriately, not in acute distress  HEENT: No icterus, No thrush,[place]/AT  Cardiovascular: RRR, S1/S2, no rubs, no gallops  Respiratory: Poor inspiratory effort with bibasilar rales. No wheezing.  Abdomen: Soft/+BS, non tender, non distended, no guarding  Extremities: No edema, No lymphangitis, No petechiae, No rashes, no synovitis  Data Reviewed: Basic Metabolic Panel:  Recent Labs Lab 06/05/2014 1030 05/24/14 0518 05/25/14 1008  NA 140 139 140  K 3.7 3.5 4.1  CL 101 102 106  CO2 GLUCOSE 126* 101* 131*  BUN 7 <5* 6  CREATININE 0.80 0.81 1.75*  CALCIUM 9.2 8.8 8.8   Liver Function Tests:  Recent Labs Lab 05/20/2014 1030 05/24/14 0518  AST 30 27  ALT 19 18  ALKPHOS 75 65  BILITOT 0.5 0.8  PROT 7.9 7.0  ALBUMIN 4.5 4.2     Recent Labs Lab 05/12/2014 1616  LIPASE 53   No results for input(s): AMMONIA in the last 168 hours. CBC:  Recent Labs Lab 05/26/2014 1030 05/24/14 0518 05/25/14 1008  WBC 6.2 5.9 6.8  NEUTROABS 4.2  --   --   HGB 16.0 14.8 14.0  HCT 50.4 47.4 46.1  MCV 96.4 96.0 97.3  PLT 305 310 297   Cardiac Enzymes: No results for input(s): CKTOTAL, CKMB, CKMBINDEX, TROPONINI in the last 168 hours. BNP: Invalid input(s): POCBNP CBG:  Recent Labs Lab 05/13/2014 1011  GLUCAP 113*    Recent Results (from the past 240 hour(s))  Blood Culture (routine x 2)     Status: None (Preliminary result)   Collection Time: 05/25/2014 10:30 AM  Result Value Ref Range Status   Specimen Description BLOOD RIGHT HAND  3 ML IN [facility] BOTTLE  Final   Special Requests NONE  Final   Culture   Final           BLOOD CULTURE RECEIVED NO GROWTH TO DATE CULTURE WILL BE HELD FOR 5 DAYS BEFORE ISSUING A FINAL NEGATIVE REPORT Performed at [facility]    Report Status PENDING  Incomplete  Blood Culture (routine x 2)     Status: None (Preliminary result)   Collection Time: 05/13/2014 11:02 AM  Result Value Ref Range Status   Specimen Description BLOOD  RIGHT HAND  Final   Special Requests BOTTLES DRAWN AEROBIC AND ANAEROBIC 5 CC EACH  Final   Culture   Final           BLOOD CULTURE RECEIVED NO GROWTH TO DATE CULTURE WILL BE HELD FOR 5 DAYS BEFORE ISSUING A FINAL NEGATIVE REPORT [facility]formed [facility]    Report Status PENDING  Incomplete  Urine culture     Status: None   Collection Time: 06/01/2014  1:16 PM  Result Value Ref Range Status   Specimen Description URINE, CLEAN CATCH  Final   Special Requests NONE  Final   Colony Count NO GROWTH [facility]formed at [facility]   Final   Culture NO GROWTH Performed at [facility]   Final   Report Status 05/24/2014 FINAL  Final     Scheduled Meds: . cloNIDine  0.3 mg Oral QHS  . enoxaparin (LOVENOX) injection  40 mg Subcutaneous  Q24H  . piperacillin-tazobactam (ZOSYN)  IV  3.375 g Intravenous 3 times per day  . sodium chloride  1,000 mL Intravenous Once  . sodium chloride  3 mL Intravenous Q12H   Continuous Infusions: . sodium chloride 0.9 % 1,000 mL with potassium chloride 20 mEq infusion 100 mL/hr at 05/25/14 1248     Kiyan Burmester, DO  [facility] Pager [phone]  If 7PM-7AM, please contact night-coverage [email or web address] Password [email or web address] 05/25/2014, 5:01 PM   LOS: 2 days

## 2014-05-25 NOTE — Progress Notes (Signed)
Called to pt room by NT d/t pt vomiting. Per NT, pt vomited up a "chunk of food" approx 50ml.  Pt points to his throat and says "It was stuck". Denies nausea and wants to continue eating. Pt instructed to eat slowly, no further signs of choking or vomiting noted. No cough or drooling w/ fluids or solids. Dr Tat aware.

## 2014-05-25 NOTE — Progress Notes (Signed)
ANTIBIOTIC CONSULT NOTE - FOLLOW UP  Pharmacy Consult for Zosyn, Vancomycin Indication: rule out sepsis  No Known Allergies  Patient Measurements: Height:  (144.8 cm) Weight: 130 lb (58.968 kg) IBW/kg (Calculated) : 43.1  Vital Signs: Temp: 97.4 F (36.3 C) (04/19 0636) Temp Source: Axillary (04/19 0636) BP: 123/86 mmHg (04/19 0636) Pulse Rate: 103 (04/19 0636) Intake/Output from previous day: 04/18 0701 - 04/19 0700 In: 3628.3 [P.O.:720; I.V.:2158.3; IV Piggyback:750] Out: 1630 [Urine:1630] Intake/Output from this shift:    Labs:  Recent Labs  05/07/2014 1030 05/24/14 0518  WBC 6.2 5.9  HGB 16.0 14.8  PLT 305 310  CREATININE 0.80 0.81   Estimated Creatinine Clearance: 99.3 mL/min (by C-G formula based on Cr of 0.81). No results for input(s): VANCOTROUGH, VANCOPEAK, VANCORANDOM, GENTTROUGH, GENTPEAK, GENTRANDOM, TOBRATROUGH, TOBRAPEAK, TOBRARND, AMIKACINPEAK, AMIKACINTROU, AMIKACIN in the last 72 hours.   Microbiology: Recent Results (from the past 720 hour(s))  Blood Culture (routine x 2)     Status: None (Preliminary result)   Collection Time: 05/09/2014 10:30 AM  Result Value Ref Range Status   Specimen Description BLOOD RIGHT HAND  3 ML IN [facility] BOTTLE  Final   Special Requests NONE  Final   Culture   Final           BLOOD CULTURE RECEIVED NO GROWTH TO DATE CULTURE WILL BE HELD FOR 5 DAYS BEFORE ISSUING A FINAL NEGATIVE REPORT Performed at [facility]    Report Status PENDING  Incomplete  Blood Culture (routine x 2)     Status: None (Preliminary result)   Collection Time: 06/02/2014 11:02 AM  Result Value Ref Range Status   Specimen Description BLOOD RIGHT HAND  Final   Special Requests BOTTLES DRAWN AEROBIC AND ANAEROBIC 5 CC EACH  Final   Culture   Final           BLOOD CULTURE RECEIVED NO GROWTH TO DATE CULTURE WILL BE HELD FOR 5 DAYS BEFORE ISSUING A FINAL NEGATIVE REPORT Performed at [facility]    Report Status PENDING  Incomplete   Urine culture     Status: None   Collection Time: 05/29/2014  1:16 PM  Result Value Ref Range Status   Specimen Description URINE, CLEAN CATCH  Final   Special Requests NONE  Final   Colony Count NO GROWTH Performed at [facility]   Final   Culture NO GROWTH Performed at [facility]   Final   Report Status 05/24/2014 FINAL  Final    Assessment: 73 yoM with PMH of Tourette's syndrome, tic disorder, scoliosis who presents to [facility] ED with diminished responsiveness progressively worsening over past week. Patient recently seen in ED for aggressive behavior. Today, patient found to be hypothermic and tachycardic. Code sepsis initiated, and pharmacy consulted to assist with dosing of Vancomycin and Zosyn.  4/17 >> Vancomycin >> 4/17 >> Zosyn >>   4/17 blood x 2: ngtd 4/17 urine: NGF  Today, 05/25/2014: Day #3 Vancomycin and Zosyn Tmax: afebrile WBC: remains WNL Renal: SCr acutely increased (0.8 > 1.75), CrCl ~ 46 ml/min 4/18 CT w/ Bilateral lower lobe atelectasis or pneumonia.  4/19 Vanc trough = 61.5, SUPRAtherapeutic and significantly above goal range  Vancomycin trough level drawn at 10:08 AM (last dose given at 01:35 AM, > 8 hours prior to dose)  Vancomycin dose given after lab draw at 10:10 AM, unable to stop current dose as it had already completely infused by the time the trough level resulted.  Goal of Therapy:  Vancomycin trough level 15-20 mcg/ml Appropriate abx dosing, eradication of infection.   Plan:   Continue Zosyn 3.375g IV Q8H infused over 4hrs.  Hold further Vancomycin doses  Recheck random vancomycin level.  Pharmacy to enter further doses when Vanc level < 20.  Follow up renal fxn, culture results, and clinical course.   Lynann Beaverhristine Kimball Appleby PharmD, BCPS Pager [phone] 05/25/2014 12:35 PM

## 2014-05-25 NOTE — Progress Notes (Signed)
Pt noted to be ST 120-130s on tele monitor. Review of telemetry shows pt has been consistently tachy 120-130s since late AM. BP stable and slightly high, O2 sat 98% RA. No SOB/dizziness noted. Dr Tat aware. No new orders at this time.

## 2014-05-25 NOTE — Progress Notes (Signed)
Informed by RN that pt's HR gradually increasing throughout the day from low 100s to 120s.  BP stable.  Pt asymptomatic  This is likely due to rebound tachycardia as the patient's clonidine is wearing off throughout the day.  Pt takes clonidine 0.3mg  at bedtime for sleep. TSH 1.679 04/05/14 echo--EF 50-55%, no WMA Add low dose metoprolol 12.5mg  once daily at 10am to help with tachycardia  DTat

## 2014-05-25 NOTE — Progress Notes (Signed)
CRITICAL VALUE ALERT  Critical value received:  Vanc trough 61.5  Date of notification:  05/25/14  Time of notification: 1207  Critical value read back: yes  Nurse who received alert:  Dannial MonarchGretchen Felicita Nuncio, RN  MD notified (1st page):    Time of first page:    MD notified (2nd page):  Time of second page:  Responding MD:  Dr Arbutus Leasat, told verbally on unit.   Time MD responded:  1213

## 2014-05-26 ENCOUNTER — Inpatient Hospital Stay (HOSPITAL_COMMUNITY): Payer: Medicaid Other

## 2014-05-26 DIAGNOSIS — N179 Acute kidney failure, unspecified: Secondary | ICD-10-CM

## 2014-05-26 DIAGNOSIS — J9602 Acute respiratory failure with hypercapnia: Secondary | ICD-10-CM

## 2014-05-26 DIAGNOSIS — J9811 Atelectasis: Secondary | ICD-10-CM

## 2014-05-26 LAB — BLOOD GAS, ARTERIAL
Acid-base deficit: 4.6 mmol/L — ABNORMAL HIGH (ref 0.0–2.0)
Acid-base deficit: 7 mmol/L — ABNORMAL HIGH (ref 0.0–2.0)
BICARBONATE: 24.3 meq/L — AB (ref 20.0–24.0)
Bicarbonate: 20.5 meq/L (ref 20.0–24.0)
Drawn by: 307971
Drawn by: 307971
FIO2: 0.6 %
MECHVT: 380 mL
O2 Content: 2 L/min
O2 SAT: 92.1 %
O2 Saturation: 92 %
PATIENT TEMPERATURE: 98.6
PEEP: 5 cmH2O
PH ART: 7.205 — AB (ref 7.350–7.450)
Patient temperature: 98.6
RATE: 18 {breaths}/min
TCO2: 22.6 mmol/L (ref 0–100)
TCO2: 19.2 mmol/L (ref 0–100)
pCO2 arterial: 63.9 mmHg (ref 35.0–45.0)
pCO2 arterial: 51.1 mmHg — ABNORMAL HIGH (ref 35.0–45.0)
pH, Arterial: 7.226 — ABNORMAL LOW (ref 7.350–7.450)
pO2, Arterial: 72.4 mmHg — ABNORMAL LOW (ref 80.0–100.0)
pO2, Arterial: 66.9 mmHg — ABNORMAL LOW (ref 80.0–100.0)

## 2014-05-26 LAB — BASIC METABOLIC PANEL
Anion gap: 7 (ref 5–15)
BUN: 8 mg/dL (ref 6–23)
CHLORIDE: 110 mmol/L (ref 96–112)
CO2: 25 mmol/L (ref 19–32)
CREATININE: 3.86 mg/dL — AB (ref 0.50–1.35)
Calcium: 8.3 mg/dL — ABNORMAL LOW (ref 8.4–10.5)
GFR calc Af Amer: 24 mL/min — ABNORMAL LOW (ref >90)
GFR calc non Af Amer: 20 mL/min — ABNORMAL LOW (ref >90)
Glucose, Bld: 119 mg/dL — ABNORMAL HIGH (ref 70–99)
Potassium: 5.1 mmol/L (ref 3.5–5.1)
Sodium: 142 mmol/L (ref 135–145)

## 2014-05-26 LAB — VANCOMYCIN, RANDOM: Vancomycin Rm: 46.3 ug/mL

## 2014-05-26 LAB — MRSA PCR SCREENING: MRSA by PCR: NEGATIVE

## 2014-05-26 MED ORDER — FENTANYL BOLUS VIA INFUSION
50.0000 ug | INTRAVENOUS | Status: DC | PRN
  Administered 2014-05-26 – 2014-05-28 (×12): 50 ug via INTRAVENOUS
  Filled 2014-05-26: qty 50

## 2014-05-26 MED ORDER — MIDAZOLAM HCL 2 MG/2ML IJ SOLN
INTRAMUSCULAR | Status: AC
  Administered 2014-05-26: 6 mg
  Filled 2014-05-26: qty 4

## 2014-05-26 MED ORDER — SODIUM CHLORIDE 0.9 % IV SOLN
25.0000 ug/h | INTRAVENOUS | Status: DC
  Administered 2014-05-26: 200 ug/h via INTRAVENOUS
  Administered 2014-05-27 (×2): 400 ug/h via INTRAVENOUS
  Administered 2014-05-28: 250 ug/h via INTRAVENOUS
  Filled 2014-05-26 (×8): qty 50

## 2014-05-26 MED ORDER — SUCCINYLCHOLINE CHLORIDE 20 MG/ML IJ SOLN
INTRAMUSCULAR | Status: AC
  Filled 2014-05-26: qty 1

## 2014-05-26 MED ORDER — ETOMIDATE 2 MG/ML IV SOLN
INTRAVENOUS | Status: AC
  Administered 2014-05-26: 20 mg
  Filled 2014-05-26: qty 20

## 2014-05-26 MED ORDER — MIDAZOLAM HCL 2 MG/2ML IJ SOLN
2.0000 mg | INTRAMUSCULAR | Status: DC | PRN
  Administered 2014-05-26 – 2014-05-28 (×10): 2 mg via INTRAVENOUS
  Filled 2014-05-26 (×9): qty 2

## 2014-05-26 MED ORDER — MIDAZOLAM HCL 2 MG/2ML IJ SOLN
INTRAMUSCULAR | Status: AC
  Filled 2014-05-26: qty 2

## 2014-05-26 MED ORDER — SODIUM CHLORIDE 0.9 % IV SOLN
INTRAVENOUS | Status: DC
  Administered 2014-05-26 – 2014-05-27 (×3): via INTRAVENOUS

## 2014-05-26 MED ORDER — PANTOPRAZOLE SODIUM 40 MG IV SOLR
40.0000 mg | Freq: Every day | INTRAVENOUS | Status: DC
  Administered 2014-05-26 – 2014-05-29 (×4): 40 mg via INTRAVENOUS
  Filled 2014-05-26 (×4): qty 40

## 2014-05-26 MED ORDER — PIPERACILLIN-TAZOBACTAM IN DEX 2-0.25 GM/50ML IV SOLN
2.2500 g | Freq: Four times a day (QID) | INTRAVENOUS | Status: DC
  Administered 2014-05-26 – 2014-05-29 (×11): 2.25 g via INTRAVENOUS
  Filled 2014-05-26 (×16): qty 50

## 2014-05-26 MED ORDER — FENTANYL CITRATE (PF) 100 MCG/2ML IJ SOLN
INTRAMUSCULAR | Status: AC
  Administered 2014-05-26: 200 ug
  Filled 2014-05-26: qty 4

## 2014-05-26 MED ORDER — CETYLPYRIDINIUM CHLORIDE 0.05 % MT LIQD
7.0000 mL | Freq: Four times a day (QID) | OROMUCOSAL | Status: DC
  Administered 2014-05-27 – 2014-06-11 (×59): 7 mL via OROMUCOSAL

## 2014-05-26 MED ORDER — FENTANYL CITRATE (PF) 100 MCG/2ML IJ SOLN: 50.0000 ug | Freq: Once | INTRAMUSCULAR | Status: AC

## 2014-05-26 MED ORDER — MIDAZOLAM HCL 2 MG/2ML IJ SOLN
2.0000 mg | INTRAMUSCULAR | Status: DC | PRN
  Administered 2014-05-26 – 2014-05-27 (×2): 2 mg via INTRAVENOUS
  Filled 2014-05-26 (×2): qty 2

## 2014-05-26 MED ORDER — HEPARIN SODIUM (PORCINE) 5000 UNIT/ML IJ SOLN
5000.0000 [IU] | Freq: Three times a day (TID) | INTRAMUSCULAR | Status: DC
  Administered 2014-05-26 – 2014-06-11 (×47): 5000 [IU] via SUBCUTANEOUS
  Filled 2014-05-26 (×46): qty 1

## 2014-05-26 MED ORDER — LIDOCAINE HCL (CARDIAC) 20 MG/ML IV SOLN
INTRAVENOUS | Status: AC
  Filled 2014-05-26: qty 5

## 2014-05-26 MED ORDER — FENTANYL CITRATE (PF) 100 MCG/2ML IJ SOLN
INTRAMUSCULAR | Status: AC
Start: 2014-05-26 — End: 2014-05-26
  Filled 2014-05-26: qty 2

## 2014-05-26 MED ORDER — SODIUM CHLORIDE 0.9 % IV BOLUS (SEPSIS)
500.0000 mL | Freq: Once | INTRAVENOUS | Status: AC
  Administered 2014-05-26: 500 mL via INTRAVENOUS

## 2014-05-26 MED ORDER — ENOXAPARIN SODIUM 30 MG/0.3ML ~~LOC~~ SOLN: 30.0000 mg | SUBCUTANEOUS | Status: DC

## 2014-05-26 MED ORDER — ROCURONIUM BROMIDE 50 MG/5ML IV SOLN
INTRAVENOUS | Status: AC
  Administered 2014-05-26: 50 mg
  Filled 2014-05-26: qty 2

## 2014-05-26 MED ORDER — ETOMIDATE 2 MG/ML IV SOLN
20.0000 mg | Freq: Once | INTRAVENOUS | Status: AC
  Filled 2014-05-26: qty 10

## 2014-05-26 MED ORDER — CHLORHEXIDINE GLUCONATE 0.12 % MT SOLN
15.0000 mL | Freq: Two times a day (BID) | OROMUCOSAL | Status: DC
  Administered 2014-05-26 – 2014-06-11 (×32): 15 mL via OROMUCOSAL
  Filled 2014-05-26 (×31): qty 15

## 2014-05-26 NOTE — Progress Notes (Signed)
ANTIBIOTIC CONSULT NOTE - INITIAL  Pharmacy Consult for Vancomycin Indication:   No Known Allergies  Patient Measurements: Height: 4\' 9"  (144.8 cm) Weight: 130 lb (58.968 kg) IBW/kg (Calculated) : 43.1 Adjusted Body Weight:   Vital Signs: Temp: 97.4 F (36.3 C) (04/20 1222) Temp Source: Oral (04/20 1222) BP: 142/93 mmHg (04/20 1315) Pulse Rate: 117 (04/20 1315) Intake/Output from previous day: 04/19 0701 - 04/20 0700 In: 4331.7 [P.O.:2040; I.V.:1941.7; IV Piggyback:350] Out: 900 [Urine:900] Intake/Output from this shift: Total I/O In: 840 [P.O.:240; I.V.:600] Out: -   Labs:  Recent Labs  05/24/14 0518 05/25/14 1008 05/26/14 0455  WBC 5.9 6.8  --   HGB 14.8 14.0  --   PLT 310 297  --   CREATININE 0.81 1.75* 3.86*   Estimated Creatinine Clearance: 20.8 mL/min (by C-G formula based on Cr of 3.86).  Recent Labs  05/25/14 1008 05/26/14 0455  VANCOTROUGH 61.5*  --   VANCORANDOM  --  46.3     Microbiology: Recent Results (from the past 720 hour(s))  Blood Culture (routine x 2)     Status: None (Preliminary result)   Collection Time: 05/25/2014 10:30 AM  Result Value Ref Range Status   Specimen Description BLOOD RIGHT HAND  3 [facility]  Final   Special Requests NONE  Final   Culture   Final           BLOOD CULTURE RECEIVED NO GROWTH TO DATE CULTURE WILL BE HELD FOR 5 DAYS BEFORE ISSUING A FINAL NEGATIVE REPORT [facility]formed at [facility]    Report Status PENDING  Incomplete  Blood Culture (routine x 2)     Status: None (Preliminary result)   Collection Time: 06/05/2014 11:02 AM  Result Value Ref Range Status   Specimen Description BLOOD RIGHT HAND  Final   Special Requests BOTTLES DRAWN AEROBIC AND ANAEROBIC 5 CC EACH  Final   Culture   Final           BLOOD CULTURE RECEIVED NO GROWTH TO DATE CULTURE WILL BE HELD FOR 5 DAYS BEFORE ISSUING A FINAL NEGATIVE REPORT Performed at [facility]    Report Status PENDING  Incomplete  Urine  culture     Status: None   Collection Time: 05/20/2014  1:16 PM  Result Value Ref Range Status   Specimen Description URINE, CLEAN CATCH  Final   Special Requests NONE  Final   Colony Count NO GROWTH Performed at [facility]   Final   Culture NO GROWTH [facility]formed at [facility]   Final   Report Status 05/24/2014 FINAL  Final    Medical History: Past Medical History  Diagnosis Date  . Tourette's syndrome   . Tic disorder   . Scoliosis   . Hemiatrophy of right leg   . Club foot     Right  . Mental retardation     mild   Assessment: [age] yoM with hospitalization in 3/'16 for ARDS, tracheostomy decannulated 04/27/14, with PEG insitu re-admitted 4/17 for AMS which improved, however pt now with progressive AKI with acute respiratory distress, now intubated.  PMH of club foot, hemiatrophy of RLE, Klippel Feil Syndrome, scoliosis s/p harrington rod surgery (2003), Restrictive lung disease, & Tourette's syndrome / Tic disorder.  Pharmacy has been dosing Vancomycin and Zosyn for PNA since 4/17.  Vancomycin discontinued 4/19 due to supratherapeutic trough and AKI.  Due to decline in pt's status, pharmacy RE-consulted to resume Vancomcyin.     4/17 >>  Vancomycin >> 4/19  4/17 >> Zosyn >>   4/17 blood x 2: ngtd 4/17 urine: NGF  Today, 05/26/2014: Day #4 antibiotics- Zosyn Tmax: afebrile WBC: remains WNL Renal: SCr quickly increasing (0.8 > 1.75 > 3.86), CrCl ~ 24 ml/min 4/18 CT w/ Bilateral lower lobe atelectasis or pneumonia.  4/19 Vanc trough = 61.5, 1g given after level was drawn 4/20 Vanc trough = 46.3  Goal of Therapy:  Vancomycin trough level 15-20 mcg/ml Eradication of infection  Plan:  Continue to HOLD vancomycin as level this morning supratherapeutic.  Recheck level in AM.    Haynes Hoehn, PharmD, BCPS 05/26/2014, 3:57 PM  Pager: [phone]

## 2014-05-26 NOTE — Procedures (Signed)
Central Venous Catheter Insertion Procedure Note [name] [name] [id number] 01/23/1991  Procedure: Insertion of Central Venous Catheter Indications: Assessment of intravascular volume, Drug and/or fluid administration and Frequent blood sampling  Procedure Details Consent: Unable to obtain consent because of emergent medical necessity. Time Out: Verified patient identification, verified procedure, site/side was marked, verified correct patient position, special equipment/implants available, medications/allergies/relevent history reviewed, required imaging and test results available.  Performed Real time US used to ID and cannulate the vessel  Maximum sterile technique was used including antiseptics, cap, gloves, gown, hand hygiene, mask and sheet. Skin prep: Chlorhexidine; local anesthetic administered A antimicrobial bonded/coated triple lumen catheter was placed in the right internal jugular vein using the Seldinger technique. Real time US was used to ID and cannulate the vessel  Evaluation Blood flow good Complications: No apparent complications Patient did tolerate procedure well. Chest X-ray ordered to verify placement.  CXR: pending.  [name] 05/26/2014, 3:55 PM

## 2014-05-26 NOTE — Consult Note (Signed)
PULMONARY / CRITICAL CARE MEDICINE   Name: [name] MRN: [id number] DOB: 03/16/1990    ADMISSION DATE:  06/03/2014 CONSULTATION DATE:  05/26/2014  REFERRING MD :  [name]  CHIEF COMPLAINT:  resp distress  INITIAL PRESENTATION: 24 y.o with long admission in 04/2014 for ARDS, tstomy decannulated 04/27/14, PEG in situ re admitted 4/17 for altered mental status, which improved. Intent was to remove PEG on 4/22. He developed progressive AKI. PCCM consulted for acute resp acidosis on 4/20 after swallow study ? aspiration PMH of club foot, hemiatrophy of RLE, Klippel Feil Syndrome, scoliosis s/p harrington rod surgery (2003), Restrictive lung disease, & Tourette's syndrome / Tic disorder  STUDIES:  CT chest/ abd/pelvis 4/18 BLL atx/ pna  SIGNIFICANT EVENTS: 4/20 swallow eval >> failed   HISTORY OF PRESENT ILLNESS:  23 y.o with long admission in 04/2014 for ARDS, tstomy decannulated 04/27/14, PEG in situ re admitted 4/17 for altered mental status, which improved. Intent was to remove PEG on 4/22. He developed progressive AKI. PCCM consulted for acute resp acidosis on 4/20 after swallow study ? aspiration PMH of club foot, hemiatrophy of RLE, Klippel Feil Syndrome, scoliosis s/p harrington rod surgery (2003), Restrictive lung disease, & Tourette's syndrome / Tic disorder His priro admit was due to ARDS due to pna He is very functional & goes to [employer], lives with mom  PAST MEDICAL HISTORY :   has a past medical history of Tourette's syndrome; Tic disorder; Scoliosis; Hemiatrophy of right leg; Club foot; and Mental retardation.  has past surgical history that includes Foot surgery (Right, 2002); Eye muscle surgery (Bilateral, 1998 and 2002); Femoral hernia repair; Harrington Rod Surgery (2003); and Tracheostomy tube placement (N/A, 04/07/2014). Prior to Admission medications   Medication Sig Start Date End Date Taking? Authorizing Provider  cloNIDine (CATAPRES) 0.1 MG tablet TAKE 3 TABLETS BY MOUTH  AT BEDTIME 04/29/14  Yes Deetta Perla, MD  Dexmethylphenidate HCl 35 MG CP24 Take 1 capsule every morning 05/03/14  Yes Princella Ion, NP   No Known Allergies  FAMILY HISTORY:  indicated that his mother is alive. He indicated that his father is alive. He indicated that his sister is alive. He indicated that his maternal grandmother is deceased. He indicated that his maternal grandfather is deceased. He indicated that his paternal grandmother is alive. He indicated that his paternal grandfather is deceased. He indicated that his cousin is alive.  SOCIAL HISTORY:  reports that he has never smoked. He has never used smokeless tobacco. He reports that he does not drink alcohol or use illicit drugs.  REVIEW OF SYSTEMS:  Unable to obtain due to resp distress  SUBJECTIVE:   VITAL SIGNS: Temp:  [97.4 F (36.3 C)-98.3 F (36.8 C)] 97.4 F (36.3 C) (04/20 1222) Pulse Rate:  [106-128] 117 (04/20 1315) Resp:  [19-38] 35 (04/20 1315) BP: (127-146)/(88-104) 142/93 mmHg (04/20 1315) SpO2:  [84 %-100 %] 99 % (04/20 1315) HEMODYNAMICS:   VENTILATOR SETTINGS:   INTAKE / OUTPUT:  Intake/Output Summary (Last 24 hours) at 05/26/14 1502 Last data filed at 05/26/14 1300  Gross per 24 hour  Intake   2980 ml  Output    400 ml  Net   2580 ml    PHYSICAL EXAMINATION: General:  Acutely ill, in resp distress, monosyllables Neuro:  Non focal HEENT:  Class 3 airway, short neck Cardiovascular:  s1s2 tachy Lungs:  Decreased BL, shallow  Abdomen:  Soft, non tender, PEG site clean Musculoskeletal:  No edema Skin:  Warm, no  rash  LABS:  CBC  Recent Labs Lab 05/16/2014 1030 05/24/14 0518 05/25/14 1008  WBC 6.2 5.9 6.8  HGB 16.0 14.8 14.0  HCT 50.4 47.4 46.1  PLT 305 310 297   Coag's  Recent Labs Lab 06/04/2014 1616  APTT 30  INR 1.09   BMET  Recent Labs Lab 05/24/14 0518 05/25/14 1008 05/26/14 0455  NA 139 140 142  K 3.5 4.1 5.1  CL 102 106 110  CO2 29 23 25   BUN <5* 6  8  CREATININE 0.81 1.75* 3.86*  GLUCOSE 101* 131* 119*   Electrolytes  Recent Labs Lab 05/24/14 0518 05/25/14 1008 05/26/14 0455  CALCIUM 8.8 8.8 8.3*   Sepsis Markers  Recent Labs Lab 05/21/2014 1040 05/08/2014 1307 05/13/2014 1616  LATICACIDVEN 4.23* 1.09  --   PROCALCITON  --   --  <0.10   ABG  Recent Labs Lab 05/26/14 1408  PHART 7.205*  PCO2ART 63.9*  PO2ART 72.4*   Liver Enzymes  Recent Labs Lab 05/17/2014 1030 05/24/14 0518  AST 30 27  ALT 19 18  ALKPHOS 75 65  BILITOT 0.5 0.8  ALBUMIN 4.5 4.2   Cardiac Enzymes No results for input(s): TROPONINI, PROBNP in the last 168 hours. Glucose  Recent Labs Lab 05/21/2014 1011  GLUCAP 113*    Imaging No results found.   ASSESSMENT / PLAN:  PULMONARY OETT 4/20 >>  A:Acute resp failure, hypercarbic P:   Did not tolerate bipap, intubate Vent bundle  CARDIOVASCULAR CVL A: hypertension P:  Hold clonidine  RENAL A:  AKI - contrast injury P:   Urine lytes  GASTROINTESTINAL A:  High aspiration risk PEH was supposed to come out 4/22 P:   Leave PEG in for now , will need reassessment   HEMATOLOGIC A:  No issues P:    INFECTIOUS A:  Aspiration pna/ HCAP P:   BCx2 4/20 >> UC 4/20 >> Sputum4/20 >>  Abx: zosyn  start date4/20 >> Vanc 4/20 >>  ENDOCRINE A:  No issues   P:   CBG while npo  NEUROLOGIC A:  Klippel Feil ADD P:   RASS goal: -1 Fent gtt Hold aderall   FAMILY  - Updates: grandma at bedside  - Inter-disciplinary family meet or Palliative Care meeting due by:  4/27    TODAY'S SUMMARY: Aspiration episode followed by acute resp acidosis , with inability to compensate due to AKI    The patient is critically ill with multiple organ systems failure and requires high complexity decision making for assessment and support, frequent evaluation and titration of therapies, application of advanced monitoring technologies and extensive interpretation of multiple databases.  Critical Care Time devoted to patient care services described in this note independent of APP time is 60 minutes.   Oretha MilchALVA,RAKESH V. MD 05/26/2014, 3:02 PM

## 2014-05-26 NOTE — Progress Notes (Signed)
RN called to room by speech therapist to assess patient's breathing. Patient noted to be lethargic, labored breathes with accessory muscle used. Vital sign obtain, Temp 97.4 oral, BP 137/88, RR 38, o2 sat 99 on 2L [place].  Dr. [name] was called to bedside. New orders obtained, patient was transferred to stepdown unit with grandmother at bedside.

## 2014-05-26 NOTE — Evaluation (Addendum)
Clinical/Bedside Swallow Evaluation Patient Details  Name: [name] MRN: [id number] Date of Birth: 04/12/1990  Today's Date: 05/26/2014 Time: SLP Start Time (ACUTE ONLY): 1150 SLP Stop Time (ACUTE ONLY): 1215 SLP Time Calculation (min) (ACUTE ONLY): 25 min  Past Medical History:  Past Medical History  Diagnosis Date  . Tourette's syndrome   . Tic disorder   . Scoliosis   . Hemiatrophy of right leg   . Club foot     Right  . Mental retardation     mild   Past Surgical History:  Past Surgical History  Procedure Laterality Date  . Foot surgery Right 2002  . Eye muscle surgery Bilateral 1998 and 2002  . Femoral hernia repair      Done when he was an infant  . Harrington rod surgery  2003    For Scoliosis  . Tracheostomy tube placement N/A 04/07/2014    Procedure: TRACHEOSTOMY;  Surgeon: Flo Shanks, MD;  Location: [facility] OR;  Service: ENT;  Laterality: N/A;   HPI:  Pt with PMH of club foot, hemiatrophy of RLE, Klippel Feil Syndrome, scoliosis s/p harrington rod surgery (2003), Restrictive lung disease, & Tourette's syndrome / Tic disorder who presented to [facility] on 06/18/14 with a 5 day history lethargy and increasing sleepiness. Apparently he visited his father on 05/22/2014. The patient was given an extra dose of Adderall accidentally per chart.  Per notes, pt had a few episodes of emesis prior to admission. He was hypothermic with soft blood pressures in the emergency department per MD note. The patient was admitted for sepsis workup per MD note. CT of the chest revealed bilateral patchy airspace disease in the right lower lobe and left lower lobe. CT of the abdomen and pelvis was essentially unremarkable. Pt apparently had event of vomiting yesterday with food chunks located in emesis. Pt had been seen for swallowing/tracheostomy PMSV use in March 16.  Pt has since been decannulated and PEG is scheduled to be removed.      Assessment / Plan / Recommendation Clinical  Impression  Pt repositioned in bed with nurse technician assistance and was initially partipative.  Pt was not on oxygen initially but after SLP spoke to RN re: need for O2, SLP replaced nasal cannula.  Approximately 15 minutes into session, pt became dyspneic with accessory muscle use and started closing his eyes.  RR was up to 40 with pt demonstrating lethargy.  At that time, SLP did NOT provide po intake due to sudden change in status and made RN aware.  Set up oral suction and provided emesis basin for use if needed due to pt's recent vomiting and his report of his stomach hurting.    Recommend pt be NPO and reeval with medical improvement.  RN provided care for pt and alerted MD to situation.  Per RN, pt to be transferred to stepdown.  Will follow up for po readiness.      Aspiration Risk  Severe    Diet Recommendation NPO   Medication Administration: Via alternative means    Other  Recommendations Oral Care Recommendations: Oral care Q4 per protocol   Follow Up Recommendations    TBD    Frequency and Duration min 1 x/week  1 week   Pertinent Vitals/Pain Afebrile, decreased      Swallow Study Prior Functional Status   see hhx    General Date of Onset: 05/26/14 HPI: Pt with PMH of club foot, hemiatrophy of RLE, Klippel Feil Syndrome, scoliosis s/p  harrington rod surgery (2003), Restrictive lung disease, & Tourette's syndrome / Tic disorder who [facility] with a 5 day history lethargy and increasing sleepiness. Apparently he visited his father on 05/22/2014. The patient was given an extra dose of Adderall accidentally per chart.  Per notes, pt had a few episodes of emesis prior to admission. He was hypothermic with soft blood pressures in the emergency department per MD note. The patient was admitted for sepsis workup per MD note. CT of the chest revealed bilateral patchy airspace disease in the right lower lobe and left lower lobe. CT of the abdomen  and pelvis was essentially unremarkable. Pt apparently had event of vomiting yesterday with food chunks located in emesis. Pt had been seen for swallowing/tracheostomy PMSV use in March 16.  Pt has since been decannulated and PEG has been removed.   Type of Study: Bedside swallow evaluation Diet Prior to this Study: Regular;Thin liquids Temperature Spikes Noted: No Respiratory Status: Nasal cannula (pt on room air upon SLP entrance to room, SLP asked RN if needed o2 and SLP placed back on pt) History of Recent Intubation: No Behavior/Cognition: Alert;Cooperative;Pleasant mood Oral Cavity - Dentition: Adequate natural dentition Patient Positioning: Upright in bed Baseline Vocal Quality: Clear Volitional Cough: Strong Volitional Swallow: Unable to elicit    Oral/Motor/Sensory Function Overall Oral Motor/Sensory Function: Appears within functional limits for tasks assessed   Ice Chips Ice chips: Not tested   Thin Liquid Thin Liquid: Not tested    Nectar Thick Nectar Thick Liquid: Not tested   Honey Thick Honey Thick Liquid: Not tested   Puree Puree: Not tested   Solid   GO    Solid: Not tested      Donavan Burnetamara Fady Stamps, MS [facility] SLP [phone]

## 2014-05-26 NOTE — Progress Notes (Signed)
I evaluated the patient earlier this morning. He was noted to be in respiratory distress. Chest x-ray did not reveal any acute abnormality but also was a poor film. ABG revealed significant acidosis with hypercarbia and hypoxia. I consulted the PCCM team who have subsequently intubated the patient. [facility] will sign off for now.

## 2014-05-26 NOTE — Progress Notes (Signed)
Patient transported from room 1238 to room 1239 on vent. ett tube retracted 2cm during bronchoscopy.

## 2014-05-26 NOTE — Procedures (Signed)
Bronchoscopy Procedure Note [name] [id number] 10/27/1990  Procedure: Bronchoscopy Indications: Diagnostic evaluation of the airways and Remove secretions  Procedure Details Consent: Risks of procedure as well as the alternatives and risks of each were explained to the (patient/caregiver).  Consent for procedure obtained. Time Out: Verified patient identification, verified procedure, site/side was marked, verified correct patient position, special equipment/implants available, medications/allergies/relevent history reviewed, required imaging and test results available.  Performed  In preparation for procedure, patient was given 100% FiO2 and bronchoscope lubricated. Sedation: fentanyl gtt  Airway entered and the following bronchi were examined: Bronchi.   Procedures performed: BAL performed left lung & purulent secretions removed, ETT withdrawn 2 cm to 22 cm mark Bronchoscope removed.  , Patient placed back on 100% FiO2 at conclusion of procedure.    Evaluation Hemodynamic Status: BP stable throughout; O2 sats: stable throughout Patient's Current Condition: stable Specimens:  Sent purulent fluid Complications: No apparent complications Patient did tolerate procedure well.   Oretha MilchALVA,RAKESH V. MD 05/26/2014

## 2014-05-26 NOTE — Progress Notes (Addendum)
ANTIBIOTIC CONSULT NOTE - FOLLOW UP  Pharmacy Consult for Zosyn, Vancomycin (d/c) Indication: rule out sepsis  No Known Allergies  Patient Measurements: Height:  (144.8 cm) Weight: 130 lb (58.968 kg) IBW/kg (Calculated) : 43.1  Vital Signs: Temp: 97.4 F (36.3 C) (04/20 0520) Temp Source: Axillary (04/20 0520) BP: 127/104 mmHg (04/20 0520) Pulse Rate: 106 (04/20 0520) Intake/Output from previous day: 04/19 0701 - 04/20 0700 In: 4331.7 [P.O.:2040; I.V.:1941.7; IV Piggyback:350] Out: 900 [Urine:900] Intake/Output from this shift:    Labs:  Recent Labs  30-May-2014 1030 05/24/14 0518 05/25/14 1008 05/26/14 0455  WBC 6.2 5.9 6.8  --   HGB 16.0 14.8 14.0  --   PLT 305 310 297  --   CREATININE 0.80 0.81 1.75* 3.86*   Estimated Creatinine Clearance: 20.8 mL/min (by C-G formula based on Cr of 3.86).  Recent Labs  05/25/14 1008 05/26/14 0455  VANCOTROUGH 61.5*  --   VANCORANDOM  --  46.3     Microbiology: Recent Results (from the past 720 hour(s))  Blood Culture (routine x 2)     Status: None (Preliminary result)   Collection Time: 2014/05/30 10:30 AM  Result Value Ref Range Status   Specimen Description BLOOD RIGHT HAND  3 ML IN [facility] BOTTLE  Final   Special Requests NONE  Final   Culture   Final           BLOOD CULTURE RECEIVED NO GROWTH TO DATE CULTURE WILL BE HELD FOR 5 DAYS BEFORE ISSUING A FINAL NEGATIVE REPORT Performed at [facility]    Report Status PENDING  Incomplete  Blood Culture (routine x 2)     Status: None (Preliminary result)   Collection Time: 30-May-2014 11:02 AM  Result Value Ref Range Status   Specimen Description BLOOD RIGHT HAND  Final   Special Requests BOTTLES DRAWN AEROBIC AND ANAEROBIC 5 CC EACH  Final   Culture   Final           BLOOD CULTURE RECEIVED NO GROWTH TO DATE CULTURE WILL BE HELD FOR 5 DAYS BEFORE ISSUING A FINAL NEGATIVE REPORT Performed at [facility]    Report Status PENDING  Incomplete  Urine culture      Status: None   Collection Time: 05-30-2014  1:16 PM  Result Value Ref Range Status   Specimen Description URINE, CLEAN CATCH  Final   Special Requests NONE  Final   Colony Count NO GROWTH Performed at [facility]   Final   Culture NO GROWTH Performed at [facility]   Final   Report Status 05/24/2014 FINAL  Final    Assessment: 72 yoM with PMH of Tourette's syndrome, tic disorder, scoliosis who presents to [facility] ED with diminished responsiveness progressively worsening over past week. Patient recently seen in ED for aggressive behavior. Today, patient found to be hypothermic and tachycardic. Code sepsis initiated, and pharmacy consulted to assist with dosing of Vancomycin and Zosyn.  4/17 >> Vancomycin >> 4/19 4/17 >> Zosyn >>   4/17 blood x 2: ngtd 4/17 urine: NGF  Today, 05/26/2014: Day #4 antibiotics- Zosyn Tmax: afebrile WBC: remains WNL Renal: SCr quickly increasing (0.8 > 1.75 > 3.86), CrCl ~ 24 ml/min 4/18 CT w/ Bilateral lower lobe atelectasis or pneumonia.  4/19 Vanc trough = 61.5, 1g given after level was drawn 4/20 Vanc trough = 46.3  Vancomycin levels SUPRAtherapeutic and significantly above goal range with increasing SCr. Per MD progress note yesterday, discontinue vancomycin.  Goal of  Therapy:  Doses adjusted per renal function Eradication of infection  Plan:  1.  Anticipating further SCr increase, so will adjust Zosyn to 2.25g IV q6h.  F/u in SCr in AM.  Will resume extended infusion dosing if/when SCr improves. 2.  No further vancomycin per MD note.  Clance BollAmanda Apolo Cutshaw, PharmD, BCPS Pager: [phone] 05/26/2014 7:32 AM

## 2014-05-26 NOTE — Procedures (Signed)
Intubation Procedure Note [name] [id number] 08/28/1990  Procedure: Intubation Indications: Respiratory insufficiency  Procedure Details Consent: Risks of procedure as well as the alternatives and risks of each were explained to the (patient/caregiver).  Consent for procedure obtained. Time Out: Verified patient identification, verified procedure, site/side was marked, verified correct patient position, special equipment/implants available, medications/allergies/relevent history reviewed, required imaging and test results available.  Performed  Maximum sterile technique was used including gloves, hand hygiene and mask.  MAC and 3  Versed 6mg , fent 200 mcg, etomidate 20 mg & rocuronium 50 mg in divided doses  Evaluation Hemodynamic Status: Transient hypotension treated with fluid; O2 sats: transiently fell during during procedure and currently acceptable Patient's Current Condition: stable Complications: No apparent complications Patient did tolerate procedure well. Chest X-ray ordered to verify placement.  CXR: pending.   Oretha MilchALVA,Sun Kihn V. MD 05/26/2014

## 2014-05-27 ENCOUNTER — Inpatient Hospital Stay (HOSPITAL_COMMUNITY): Payer: Medicaid Other

## 2014-05-27 LAB — BLOOD GAS, ARTERIAL
ACID-BASE DEFICIT: 5.4 mmol/L — AB (ref 0.0–2.0)
Acid-base deficit: 6.9 mmol/L — ABNORMAL HIGH (ref 0.0–2.0)
Bicarbonate: 21.8 mEq/L (ref 20.0–24.0)
Bicarbonate: 21.3 mEq/L (ref 20.0–24.0)
DRAWN BY: 307971
Drawn by: 307971
FIO2: 0.4 %
FIO2: 0.4 %
O2 SAT: 95.2 %
O2 SAT: 95.9 %
PATIENT TEMPERATURE: 37
PEEP/CPAP: 5 cmH2O
PEEP: 5 cmH2O
PH ART: 7.178 — AB (ref 7.350–7.450)
Patient temperature: 37
RATE: 18 resp/min
RATE: 28 resp/min
TCO2: 20.9 mmol/L (ref 0–100)
TCO2: 20 mmol/L (ref 0–100)
VT: 380 mL
VT: 380 mL
pCO2 arterial: 61 mmHg (ref 35.0–45.0)
pCO2 arterial: 49.1 mmHg — ABNORMAL HIGH (ref 35.0–45.0)
pH, Arterial: 7.26 — ABNORMAL LOW (ref 7.350–7.450)
pO2, Arterial: 82.2 mmHg (ref 80.0–100.0)
pO2, Arterial: 81.5 mmHg (ref 80.0–100.0)

## 2014-05-27 LAB — BASIC METABOLIC PANEL
Anion gap: 8 (ref 5–15)
BUN: 10 mg/dL (ref 6–23)
CO2: 24 mmol/L (ref 19–32)
Calcium: 7.9 mg/dL — ABNORMAL LOW (ref 8.4–10.5)
Chloride: 115 mmol/L — ABNORMAL HIGH (ref 96–112)
Creatinine, Ser: 5.35 mg/dL — ABNORMAL HIGH (ref 0.50–1.35)
GFR calc Af Amer: 16 mL/min — ABNORMAL LOW (ref >90)
GFR calc non Af Amer: 14 mL/min — ABNORMAL LOW (ref >90)
GLUCOSE: 71 mg/dL (ref 70–99)
POTASSIUM: 4.5 mmol/L (ref 3.5–5.1)
SODIUM: 147 mmol/L — AB (ref 135–145)

## 2014-05-27 LAB — GLUCOSE, CAPILLARY
GLUCOSE-CAPILLARY: 84 mg/dL (ref 70–99)
GLUCOSE-CAPILLARY: 64 mg/dL — AB (ref 70–99)
GLUCOSE-CAPILLARY: 106 mg/dL — AB (ref 70–99)
Glucose-Capillary: 124 mg/dL — ABNORMAL HIGH (ref 70–99)
Glucose-Capillary: 62 mg/dL — ABNORMAL LOW (ref 70–99)

## 2014-05-27 LAB — VANCOMYCIN, TROUGH: VANCOMYCIN TR: 38.9 ug/mL — AB (ref 10.0–20.0)

## 2014-05-27 MED ORDER — DEXTROSE 50 % IV SOLN
INTRAVENOUS | Status: AC
  Administered 2014-05-27: 50 mL
  Filled 2014-05-27: qty 50

## 2014-05-27 MED ORDER — NOREPINEPHRINE BITARTRATE 1 MG/ML IV SOLN
2.0000 ug/min | INTRAVENOUS | Status: DC
  Filled 2014-05-27: qty 4

## 2014-05-27 MED ORDER — DEXTROSE 50 % IV SOLN
25.0000 mL | Freq: Once | INTRAVENOUS | Status: AC
  Administered 2014-05-27: 25 mL via INTRAVENOUS

## 2014-05-27 MED ORDER — SODIUM CHLORIDE 0.9 % IV SOLN
1.0000 mg/h | INTRAVENOUS | Status: DC
  Administered 2014-05-27: 0.5 mg/h via INTRAVENOUS
  Administered 2014-05-28: 2 mg/h via INTRAVENOUS
  Filled 2014-05-27 (×3): qty 10

## 2014-05-27 MED ORDER — DEXTROSE 50 % IV SOLN
INTRAVENOUS | Status: AC
  Filled 2014-05-27: qty 50

## 2014-05-27 NOTE — Progress Notes (Signed)
25cc of 2500 mcg in 250 mL Fentanyl wasted in sink with Santiago GladAllison Koontz, RN.   Starla Linkarolyn J Cybil [place], RN

## 2014-05-27 NOTE — Progress Notes (Signed)
PULMONARY / CRITICAL CARE MEDICINE   Name: Aaron Aaron MRN: [id number] DOB: Oct 20, 1990    ADMISSION DATE:  Jun 06, 2014 CONSULTATION DATE:  05/27/2014  REFERRING MD :  Aaron Aaron  CHIEF COMPLAINT:  resp distress  INITIAL PRESENTATION: 24 y.o with long admission in 04/2014 for ARDS, tstomy decannulated 04/27/14, PEG in situ re admitted 4/17 for altered mental status, which improved. Intent was to remove PEG on 4/22. He developed progressive AKI. PCCM consulted for acute resp acidosis on 4/20 after swallow study ? aspiration PMH of club foot, hemiatrophy of RLE, Klippel Feil Syndrome, scoliosis s/p harrington rod surgery (2003), Restrictive lung disease, & Tourette's syndrome / Tic disorder  STUDIES:  CT chest/ abd/pelvis 4/18 BLL atx/ pna  SIGNIFICANT EVENTS: 4/20 swallow eval >> failed   HISTORY OF PRESENT ILLNESS:  23 y.o with long admission in 04/2014 for ARDS, tstomy decannulated 04/27/14, PEG in situ re admitted 4/17 for altered mental status, which improved. Intent was to remove PEG on 4/22. He developed progressive AKI. PCCM consulted for acute resp acidosis on 4/20 after swallow study ? aspiration PMH of club foot, hemiatrophy of RLE, Klippel Feil Syndrome, scoliosis s/p harrington rod surgery (2003), Restrictive lung disease, & Tourette's syndrome / Tic disorder His priro admit was due to ARDS due to pna He is very functional & goes to [employer], lives with mom    SUBJECTIVE:   VITAL SIGNS: Temp:  [97.4 F (36.3 C)-99.5 F (37.5 C)] 98.6 F (37 C) (04/21 0900) Pulse Rate:  [94-130] 119 (04/21 0923) Resp:  [15-39] 28 (04/21 0923) BP: (73-160)/(37-117) 89/52 mmHg (04/21 0923) SpO2:  [89 %-100 %] 98 % (04/21 0923) FiO2 (%):  [40 %-60 %] 40 % (04/21 0923) HEMODYNAMICS:   VENTILATOR SETTINGS: Vent Mode:  [-] PRVC FiO2 (%):  [40 %-60 %] 40 % Set Rate:  [18 bmp-28 bmp] 28 bmp Vt Set:  [380 mL] 380 mL PEEP:  [5 cmH20] 5 cmH20 Plateau Pressure:  [24 cmH20-30 cmH20] 25 cmH20 INTAKE /  OUTPUT:  Intake/Output Summary (Last 24 hours) at 05/27/14 1003 Last data filed at 05/27/14 0900  Gross per 24 hour  Intake   2860 ml  Output   1725 ml  Net   1135 ml    PHYSICAL EXAMINATION: General:  Acutely ill,intuabted and agitated Neuro:  Non focal. No follow commands HEENT:  Class 3 airway, short neck, no jvd Cardiovascular:  s1s2 tachy Lungs:  Decreased BLL bases Abdomen:  Soft, non tender, PEG site clean Musculoskeletal:  No edema Skin:  Warm, no rash  LABS:  CBC  Recent Labs Lab 06-Jun-2014 1030 05/24/14 0518 05/25/14 1008  WBC 6.2 5.9 6.8  HGB 16.0 14.8 14.0  HCT 50.4 47.4 46.1  PLT 305 310 297   Coag's  Recent Labs Lab 2014-06-06 1616  APTT 30  INR 1.09   BMET  Recent Labs Lab 05/25/14 1008 05/26/14 0455 05/27/14 0414  NA 140 142 147*  K 4.1 5.1 4.5  CL 106 110 115*  CO2 BUN CREATININE 1.75* 3.86* 5.35*  GLUCOSE 131* 119* 71   Electrolytes  Recent Labs Lab 05/25/14 1008 05/26/14 0455 05/27/14 0414  CALCIUM 8.8 8.3* 7.9*   Sepsis Markers  Recent Labs Lab Jun 06, 2014 1040 06-06-2014 1307 06-06-2014 1616  LATICACIDVEN 4.23* 1.09  --   PROCALCITON  --   --  <0.10   ABG  Recent Labs Lab 05/26/14 1408 05/26/14 1752 05/27/14 0857  PHART 7.205* 7.226* 7.178*  PCO2ART  63.9* 51.1* 61.0*  PO2ART 72.4* 66.9* 82.2   Liver Enzymes  Recent Labs Lab 05/29/2014 1030 05/24/14 0518  AST 30 27  ALT 19 18  ALKPHOS 75 65  BILITOT 0.5 0.8  ALBUMIN 4.5 4.2   Cardiac Enzymes No results for input(s): TROPONINI, PROBNP in the last 168 hours. Glucose  Recent Labs Lab 05/30/2014 1011  GLUCAP 113*    Imaging Dg Chest Port 1 View  05/26/2014   CLINICAL DATA:  Post bronchoscopy  EXAM: PORTABLE CHEST - 1 VIEW  COMPARISON:  Portable exam 1658 hours compared to 1530 hours  FINDINGS: Tip of endotracheal tube projects approximately 14 mm above carina.  Nasogastric tube extends into stomach.  RIGHT jugular central venous catheter  tip projects over cavoatrial junction.  Extensive spinal fixation rods limit assessment of the ET tube.  Heart is enlarged with pulmonary vascular congestion.  Improved aeration in LEFT lung post bronchoscopy.  RIGHT lung stable.  No pneumothorax.  IMPRESSION: Improved aeration in LEFT lung versus earlier exam.   Electronically Signed   By: Ulyses Southward M.D.   On: 05/26/2014 17:11   Portable Chest Xray  05/26/2014   CLINICAL DATA:  Endotracheal tube placement and right IJ central line placement. Pneumonia. Aspiration.  EXAM: PORTABLE CHEST - 1 VIEW 3:30 p.m.  COMPARISON:  05/26/2014 at 12:19 p.m. and 05/26/2014 and CT scan of the chest dated 05/24/2014  FINDINGS: Endotracheal tube tip is at the T4 level at the carina. I recommend retracting approximately 2 cm. OG tube tip is in the distal stomach. IJ catheter tip is just above the cavoatrial junction in good position. No pneumothorax.  There is new complete opacification of the left hemi thorax with some air bronchograms.  No acute abnormalities of the right lung. Chronic deformity of the bony thorax.  Gastrostomy tube in place.  Spinal rods and wires in place.  IMPRESSION: 1. Endotracheal tube is at the carina and needs to be retracted approximately 2 cm. 2. IJ catheter appears in good position with no pneumothorax. 3. New complete opacification of the left lung.  Critical Value/emergent results were called by telephone at the time of interpretation on 05/26/2014 at 4:17 pm to Cruzita Lederer, RN , who verbally acknowledged these results.   Electronically Signed   By: Francene Boyers M.D.   On: 05/26/2014 16:19   Dg Chest Port 1 View  05/26/2014   CLINICAL DATA:  24 year old male with shortness of breath since this morning. Initial encounter.  EXAM: PORTABLE CHEST - 1 VIEW  COMPARISON:  Chest CT 05/24/2014 and earlier.  FINDINGS: Portable AP semi upright view at 1219 hours. 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 Posterior spinal rods appears stable and intact. Cardiomegaly and mediastinal contours are stable. Stable lung parenchyma. No pneumothorax or acute pulmonary opacity identified.  Left upper quadrant percutaneous gastrostomy re- identified. Increased proximal stomach gas.  IMPRESSION: Stable.  No acute cardiopulmonary abnormality.   Electronically Signed   By: Odessa Fleming M.D.   On: 05/26/2014 12:39     ASSESSMENT / PLAN:  PULMONARY OETT 4/20 >>  A:Acute resp failure, hypercarbic P:   Vent support, 8cc/kg  CARDIOVASCULAR CVL A:  Hypertension 4/21 hypotension P:  Hold clonidine May need pressors depending on improvement after lightening sedation and improvement in acidosis  RENAL Lab Results  Component Value Date   CREATININE 5.35* 05/27/2014   CREATININE 3.86* 05/26/2014   CREATININE 1.75* 05/25/2014  A:   AKI - ATN + contrast injury Metabolic acidosis P:   Renal consult 4/21, ? Will require CVVHD  GASTROINTESTINAL A:  High aspiration risk PEG was supposed to come out 4/22 P:   Leave PEG in for now , will need reassessment   HEMATOLOGIC A:  No issues P:    INFECTIOUS A:  Aspiration pna/ HCAP P:   BCx2 4/20 >> UC 4/20 >>neg Sputum4/20 >>  Abx: : zosyn  4/20 >> Vanc 4/20 >>  ENDOCRINE  A:  No issues   P:   CBG while npo  NEUROLOGIC A:  Klippel Feil Syndrome ADD P:   RASS goal: -1 Fent gtt + versed Hold aderall   FAMILY  - Updates: grandma at bedside  - Inter-disciplinary family meet or Palliative Care meeting due by:  4/27    TODAY'S SUMMARY: Aspiration episode followed by acute resp acidosis , with inability to compensate due to AKI. 4/21 worsening acidosis and hypotension.  Brett CanalesSteve Minor ACNP [facility] P[facility] Pager [phone] till 3 pm If no answer page [phone] 05/27/2014, 10:04 AM   Attending Note:  I have examined patient, reviewed labs, studies and notes. I have discussed the case with S Minor, and I  agree with the data and plans as amended above. Apparent aspiration PNA vs HCAP, acute resp failure due to this and to renal injury. On my eval he is ventilated, sedated and comfortable. Mixed acidosis on ABG this am, will increase minute ventilation, ask renal to see him. At high risk for requiring HD, CVVHD. Will continue abx and current supportive care.  Independent critical care time is 40 minutes.   Levy Pupaobert Anicia Leuthold, MD, PhD 05/27/2014, 12:28 PM [facility] [phone] or if no answer [phone]

## 2014-05-27 NOTE — Progress Notes (Signed)
eLink Physician-Brief Progress Note Patient Name: [name] DOB: 04/06/1990 MRN: [id number]   Date of Service  05/27/2014  HPI/Events of Note  Agitated in spite of Fentanyl IV infusion at high dose and Versed Q 2 hours PRN.   eICU Interventions  Will add a Versed IV infusion. Titrate to RASS = 0 to -1.      Intervention Category Major Interventions: Delirium, psychosis, severe agitation - evaluation and management  [name] 05/27/2014, 6:15 PM

## 2014-05-27 NOTE — Progress Notes (Signed)
CARE MANAGEMENT NOTE 05/27/2014  Patient:  [name],[name]   Account Number:  [id number]  Date Initiated:  05/24/2014  Documentation initiated by:  Trinna BalloonMcGIBBONEY,COOKIE MYRAETTE  Subjective/Objective Assessment:   Pt admitted with cco sepsis     Action/Plan:   from home with mother   Anticipated DC Date:  05/30/2014   Anticipated DC Plan:  HOME W HOME HEALTH SERVICES      DC Planning Services  CM consult      Choice offered to / List presented to:             Status of service:  In process, will continue to follow Medicare Important Message given?   (If response is "NO", the following Medicare IM given date fields will be blank) Date Medicare IM given:   Medicare IM given by:   Date Additional Medicare IM given:   Additional Medicare IM given by:    Discharge Disposition:    Per UR Regulation:  Reviewed for med. necessity/level of care/duration of stay  If discussed at Long Length of Stay Meetings, dates discussed:    Comments:  May 27, 2014/Keishawna Carranza L. Earlene Plateravis, RN, BSN, CCM. Case Management [employer] [phone] No discharge needs present of time of review. Respfailure with aki transferred to icu on [phone] and required intubation.  05/15/2014 MMcGibboney, RN, BSN Chart reviewed.

## 2014-05-27 NOTE — Progress Notes (Signed)
BP soft this AM with MAP of 60 d/t sedation requirements. Per Brett CanalesSteve Minor, NP MAP of 60 is fine at this time. Will notify if patient drops below.

## 2014-05-27 NOTE — Progress Notes (Signed)
INITIAL NUTRITION ASSESSMENT  DOCUMENTATION CODES Per approved criteria  -Not Applicable   INTERVENTION: If pt expected to remain intubated for >24 hours, recommend initiation of Vital AF 1.2 @ 20 ml/hr via peg tube and increase by 10 ml every 4 hours to goal rate of 60 ml/hr.   Tube feeding regimen provides 1728 kcal (100% of needs), 108 grams of protein, and 1166 ml of H2O.   NUTRITION DIAGNOSIS: Inadequate oral intake related to inability to eat as evidenced by NPO.   Goal: Pt to meet >/= 90% of their estimated nutrition needs   Monitor:  Weight trend, vent status/settings, TF initiation, labs  Reason for Assessment: New Vent  24 y.o. male  Admitting Dx: <principal problem not specified>  ASSESSMENT: 24 y.o with long admission in 04/2014 for ARDS, tstomy decannulated 04/27/14, PEG in situ re admitted 4/17 for altered mental status, which improved. Intent was to remove PEG on 4/22. He developed progressive AKI. PCCM consulted for acute resp acidosis on 4/20 after swallow study ? aspiration PMH of club foot, hemiatrophy of RLE, Klippel Feil Syndrome, scoliosis s/p harrington rod surgery (2003), Restrictive lung disease, & Tourette's syndrome / Tic disorder  - Pt with 13 lb wt loss in one month. (9%- significant for time frame) - No signs of fat or muscle wasting. - Suspect some form of malnutrition, but pt does not meet criteria at this time.   Patient is currently intubated on ventilator support MV: 10.2 L/min Temp (24hrs), Avg:98.8 F (37.1 C), Min:97.4 F (36.3 C), Max:99.5 F (37.5 C)  Propofol: none  Height: Ht Readings from Last 1 Encounters:  05/26/14  (1.499 m)    Weight: Wt Readings from Last 1 Encounters:  03-Jun-2014 130 lb (58.968 kg)    Ideal Body Weight: 47.7 kg  % Ideal Body Weight: 126%  Wt Readings from Last 10 Encounters:  2014/06/03 130 lb (58.968 kg)  04/27/14 143 lb 14.4 oz (65.273 kg)  10/16/13 144 lb 12.8 oz (65.681 kg)  03/03/13  124 lb 12.8 oz (56.609 kg)  01/30/13 141 lb 1.5 oz (64 kg)  10/07/12 141 lb (63.957 kg)    Usual Body Weight: 140-144 lbs  % Usual Body Weight: 93%  BMI:  Body mass index is 26.24 kg/(m^2).  Estimated Nutritional Needs: Kcal: 1728 Protein: 90-105 g Fluid: 1.7 L/day  Skin: intact  Diet Order: Diet NPO time specified  EDUCATION NEEDS: -Education not appropriate at this time   Intake/Output Summary (Last 24 hours) at 05/27/14 1036 Last data filed at 05/27/14 1000  Gross per 24 hour  Intake   3005 ml  Output   1725 ml  Net   1280 ml    Last BM: 4/20   Labs:   Recent Labs Lab 05/25/14 1008 05/26/14 0455 05/27/14 0414  NA 140 142 147*  K 4.1 5.1 4.5  CL 106 110 115*  CO2 BUN CREATININE 1.75* 3.86* 5.35*  CALCIUM 8.8 8.3* 7.9*  GLUCOSE 131* 119* 71    CBG (last 3)  No results for input(s): GLUCAP in the last 72 hours.  Scheduled Meds: . antiseptic oral rinse  7 mL Mouth Rinse QID  . chlorhexidine  15 mL Mouth Rinse BID  . heparin subcutaneous  5,000 Units Subcutaneous 3 times per day  . metoprolol tartrate  12.5 mg Oral Daily  . pantoprazole (PROTONIX) IV  40 mg Intravenous Daily  . piperacillin-tazobactam (ZOSYN)  IV  2.25 g Intravenous 4  times per day  . sodium chloride  1,000 mL Intravenous Once  . sodium chloride  3 mL Intravenous Q12H    Continuous Infusions: . sodium chloride 100 mL/hr at 05/27/14 0244  . fentaNYL infusion INTRAVENOUS 400 mcg/hr (05/27/14 0731)  . norepinephrine (LEVOPHED) Adult infusion      Past Medical History  Diagnosis Date  . Tourette's syndrome   . Tic disorder   . Scoliosis   . Hemiatrophy of right leg   . Club foot     Right  . Mental retardation     mild    Past Surgical History  Procedure Laterality Date  . Foot surgery Right 2002  . Eye muscle surgery Bilateral 1998 and 2002  . Femoral hernia repair      Done when he was an infant  . Harrington rod surgery  2003    For Scoliosis   . Tracheostomy tube placement N/A 04/07/2014    Procedure: TRACHEOSTOMY;  Surgeon: Flo ShanksKarol Wolicki, MD;  [facility];  Service: ENT;  Laterality: N/A;    Emmaline KluverHaley Brok Stocking MS, RD, LDN [phone]

## 2014-05-27 NOTE — Progress Notes (Signed)
SLP Cancellation Note  Patient Details Name: [name] MRN: [id number] DOB: 02/17/1990   Cancelled treatment:       Reason Eval/Treat Not Completed: Patient not medically ready  Pt now intubated, please order SLP swallow evaluation when desired.  Thanks.    Aaron Burnetamara Iosefa Weintraub, MS [facility] SLP [phone]

## 2014-05-27 NOTE — Consult Note (Signed)
[name] Admit Date: 11-10-2014 05/27/2014 Arita MissSANFORD, Walter Grima B Requesting Physician:  Delton CoombesByrum MD  Reason for Consult:  AKI HPI:  24 year old male with complicated past medical history including Klippel-Feil syndrome, scoliosis status post Harrington rod surgery in 2003, restrictive lung disease, long admission in 03/16 for ARDS requiring tracheostomy and PEG placement. He was admitted 05-Oct-2014 with altered mental status, perhaps related to medications, but subsequently has developed hypercarbic respiratory failure requiring ventilator assistance perhaps related to a bilateral pneumonia and aspiration as well as acute kidney injury. He was sent to have his PEG tube removed.  Patient received contrasted CT of chest abdomen and pelvis on 05/24/14. He has a normal baseline kidney function. He has subsequently developed a non-oliguric acute kidney injury with serum creatinine of 5.35 today. No other electrolyte abdomen bowel is at the current time. He has been hemodynamically stable with a mild sinus tachycardia but not requiring any pressors. The CT study he received demonstrated no structural renal abnormalities. No other obvious nephrotoxins.   CREATININE, SER (mg/dL)  Date Value  40/98/119104/21/2016 5.35*  05/26/2014 3.86*  05/25/2014 1.75*  05/24/2014 0.81  010-06-2014 0.80  05/12/2014 0.92  05/08/2014 0.90  04/24/2014 0.78  04/20/2014 0.79  04/16/2014 0.92  ] I/Os: I/O last 3 completed shifts: In: 4790 [P.O.:960; I.V.:3730; IV Piggyback:100] Out: 2025 [Urine:1575; Emesis/NG output:450]  ROS  Balance of 12 systems is unable to be assessed given patient's intubated and sedated status  PMH  Past Medical History  Diagnosis Date  . Tourette's syndrome   . Tic disorder   . Scoliosis   . Hemiatrophy of right leg   . Club foot     Right  . Mental retardation     mild   PSH  Past Surgical History  Procedure Laterality Date  . Foot surgery Right 2002  . Eye muscle surgery Bilateral 1998 and  2002  . Femoral hernia repair      Done when he was an infant  . Harrington rod surgery  2003    For Scoliosis  . Tracheostomy tube placement N/A 04/07/2014    Procedure: TRACHEOSTOMY;  Surgeon: Flo ShanksKarol Wolicki, MD;  [facility];  Service: ENT;  Laterality: N/A;   FH  Family History  Problem Relation Age of Onset  . Breast cancer Maternal Grandmother     Died in her mid 6570's  . Prostate cancer Maternal Grandfather     Died in his 4180's  . Hypertension     SH  reports that he has never smoked. He has never used smokeless tobacco. He reports that he does not drink alcohol or use illicit drugs. Allergies No Known Allergies Home medications Prior to Admission medications   Medication Sig Start Date End Date Taking? Authorizing Provider  cloNIDine (CATAPRES) 0.1 MG tablet TAKE 3 TABLETS BY MOUTH AT BEDTIME 04/29/14  Yes Deetta PerlaWilliam H Hickling, MD  Dexmethylphenidate HCl 35 MG CP24 Take 1 capsule every morning 05/03/14  Yes Princella Ionina P Goodpasture, NP    Current Medications Scheduled Meds: . antiseptic oral rinse  7 mL Mouth Rinse QID  . chlorhexidine  15 mL Mouth Rinse BID  . heparin subcutaneous  5,000 Units Subcutaneous 3 times per day  . metoprolol tartrate  12.5 mg Oral Daily  . pantoprazole (PROTONIX) IV  40 mg Intravenous Daily  . piperacillin-tazobactam (ZOSYN)  IV  2.25 g Intravenous 4 times per day  . sodium chloride  1,000 mL Intravenous Once  . sodium chloride  3 mL Intravenous Q12H  Continuous Infusions: . sodium chloride 100 mL/hr at 05/27/14 1249  . fentaNYL infusion INTRAVENOUS 400 mcg/hr (05/27/14 1250)  . norepinephrine (LEVOPHED) Adult infusion Stopped (05/27/14 1106)   PRN Meds:.acetaminophen **OR** acetaminophen, fentaNYL, midazolam, midazolam, ondansetron **OR** ondansetron (ZOFRAN) IV  CBC  Recent Labs Lab 05/28/2014 1030 05/24/14 0518 05/25/14 1008  WBC 6.2 5.9 6.8  NEUTROABS 4.2  --   --   HGB 16.0 14.8 14.0  HCT 50.4 47.4 46.1  MCV 96.4 96.0 97.3  PLT  305 310 297   Basic Metabolic Panel  Recent Labs Lab 05/21/2014 1030 05/24/14 0518 05/25/14 1008 05/26/14 0455 05/27/14 0414  NA 140 139 140 142 147*  K 3.7 3.5 4.1 5.1 4.5  CL 101 102 106 110 115*  CO2 GLUCOSE 126* 101* 131* 119* 71  BUN 7 <5* CREATININE 0.80 0.81 1.75* 3.86* 5.35*  CALCIUM 9.2 8.8 8.8 8.3* 7.9*    Physical Exam  Blood pressure 109/45, pulse 104, temperature 98.8 F (37.1 C), temperature source Oral, resp. rate 28, height  (1.499 m), weight 58.968 kg (130 lb), SpO2 98 %. GEN: young man, intubated and sedated ENT: ETT in place EYES: Eyes open, not tracking,  CV: tachy, regular,nl s1s2 PULM: coarse bs b/l, nl wob ABD: s/nt/nd nabs SKIN: no rashes/lesions EXT:no LEE   Assessment/Plan 24 year old male with contrast mediated acute kidney injury, nonoliguric, in the setting of VDRF for aspiration and hypercarbic respiratory failure with pneumonia.  1. 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 Normal baseline GFR 2. Nonoliguric, K/HCO3 ok, Volume status ok 3. No indication for RRT at current time 4. Very likely to recover function Daily weights, Daily Renal Panel, Strict I/Os, Avoid nephrotoxins (NSAIDs, judicious IV Contrast) Has inappropriate renal compensation Father updated at bedside. Follow closely 1. VDRF 2/2 aspiration and HCAP with hypercapneia 1. Per CCM 2. IMproved from this AM with inc RR 3. On zosyn 2. Presence of PEG tube  Sabra Heck MD [phone] pgr 05/27/2014, 4:29 PM

## 2014-05-27 NOTE — Progress Notes (Signed)
CRITICAL VALUE ALERT  Critical value received:  Vancomycin trough 38.5  Date of notification:  05/27/2014  Time of notification:  0533  Critical value read back:Yes.    Nurse who received alert: Starla Linkarolyn J Alif Petrak, RN  MD notified (1st page):  ELINK  Time of first page:  0616  MD notified (2nd page):  Time of second page:  Responding MD:  Pola CornELINK  Time MD responded:  [phone]

## 2014-05-28 ENCOUNTER — Inpatient Hospital Stay (HOSPITAL_COMMUNITY): Payer: Medicaid Other

## 2014-05-28 ENCOUNTER — Ambulatory Visit (HOSPITAL_COMMUNITY): Payer: Medicaid Other

## 2014-05-28 ENCOUNTER — Inpatient Hospital Stay (HOSPITAL_COMMUNITY): Payer: Medicaid Other | Admitting: Anesthesiology

## 2014-05-28 ENCOUNTER — Ambulatory Visit: Payer: Medicaid Other | Admitting: Pediatrics

## 2014-05-28 LAB — BLOOD GAS, ARTERIAL
Acid-base deficit: 5.9 mmol/L — ABNORMAL HIGH (ref 0.0–2.0)
Bicarbonate: 19.1 mEq/L — ABNORMAL LOW (ref 20.0–24.0)
DRAWN BY: 235321
FIO2: 0.4 %
LHR: 28 {breaths}/min
O2 Saturation: 99 %
PATIENT TEMPERATURE: 37.1
PCO2 ART: 38.6 mmHg (ref 35.0–45.0)
PEEP: 5 cmH2O
PH ART: 7.317 — AB (ref 7.350–7.450)
TCO2: 17.9 mmol/L (ref 0–100)
VT: 380 mL
pO2, Arterial: 149 mmHg — ABNORMAL HIGH (ref 80.0–100.0)

## 2014-05-28 LAB — BASIC METABOLIC PANEL
Anion gap: 6 (ref 5–15)
BUN: 18 mg/dL (ref 6–23)
CO2: 21 mmol/L (ref 19–32)
Calcium: 7.5 mg/dL — ABNORMAL LOW (ref 8.4–10.5)
Chloride: 119 mmol/L — ABNORMAL HIGH (ref 96–112)
Creatinine, Ser: 6.72 mg/dL — ABNORMAL HIGH (ref 0.50–1.35)
GFR calc non Af Amer: 10 mL/min — ABNORMAL LOW (ref >90)
GFR, EST AFRICAN AMERICAN: 12 mL/min — AB (ref >90)
GLUCOSE: 72 mg/dL (ref 70–99)
POTASSIUM: 3.8 mmol/L (ref 3.5–5.1)
Sodium: 146 mmol/L — ABNORMAL HIGH (ref 135–145)

## 2014-05-28 LAB — GLUCOSE, CAPILLARY
GLUCOSE-CAPILLARY: 71 mg/dL (ref 70–99)
GLUCOSE-CAPILLARY: 82 mg/dL (ref 70–99)
Glucose-Capillary: 67 mg/dL — ABNORMAL LOW (ref 70–99)
Glucose-Capillary: 70 mg/dL (ref 70–99)
Glucose-Capillary: 114 mg/dL — ABNORMAL HIGH (ref 70–99)
Glucose-Capillary: 160 mg/dL — ABNORMAL HIGH (ref 70–99)

## 2014-05-28 LAB — URINALYSIS, ROUTINE W REFLEX MICROSCOPIC
Bilirubin Urine: NEGATIVE
GLUCOSE, UA: NEGATIVE mg/dL
HGB URINE DIPSTICK: NEGATIVE
KETONES UR: NEGATIVE mg/dL
Nitrite: NEGATIVE
PH: 6 (ref 5.0–8.0)
Protein, ur: NEGATIVE mg/dL
Specific Gravity, Urine: 1.01 (ref 1.005–1.030)
Urobilinogen, UA: 0.2 mg/dL (ref 0.0–1.0)

## 2014-05-28 LAB — CBC
HCT: 34.6 % — ABNORMAL LOW (ref 39.0–52.0)
Hemoglobin: 10.4 g/dL — ABNORMAL LOW (ref 13.0–17.0)
MCH: 29.5 pg (ref 26.0–34.0)
MCHC: 30.1 g/dL (ref 30.0–36.0)
MCV: 98.3 fL (ref 78.0–100.0)
PLATELETS: 228 10*3/uL (ref 150–400)
RBC: 3.52 MIL/uL — ABNORMAL LOW (ref 4.22–5.81)
RDW: 13.8 % (ref 11.5–15.5)
WBC: 11 10*3/uL — AB (ref 4.0–10.5)

## 2014-05-28 LAB — URINE MICROSCOPIC-ADD ON

## 2014-05-28 MED ORDER — ROCURONIUM BROMIDE 50 MG/5ML IV SOLN
INTRAVENOUS | Status: AC
  Filled 2014-05-28: qty 2

## 2014-05-28 MED ORDER — PROPOFOL 10 MG/ML IV BOLUS
INTRAVENOUS | Status: DC | PRN
  Administered 2014-05-28: 50 mg via INTRAVENOUS

## 2014-05-28 MED ORDER — DEXTROSE-NACL 5-0.45 % IV SOLN
INTRAVENOUS | Status: DC
  Administered 2014-05-28 – 2014-05-29 (×2): via INTRAVENOUS

## 2014-05-28 MED ORDER — SUCCINYLCHOLINE CHLORIDE 20 MG/ML IJ SOLN
INTRAMUSCULAR | Status: DC
Start: 2014-05-28 — End: 2014-05-29
  Filled 2014-05-28: qty 1

## 2014-05-28 MED ORDER — SODIUM CHLORIDE 0.9 % IV SOLN
25.0000 ug/h | INTRAVENOUS | Status: DC
  Administered 2014-05-28: 200 ug/h via INTRAVENOUS
  Administered 2014-05-29: 250 ug/h via INTRAVENOUS
  Administered 2014-05-29: 200 ug/h via INTRAVENOUS
  Administered 2014-05-30 – 2014-06-01 (×8): 400 ug/h via INTRAVENOUS
  Administered 2014-06-01: 100 ug/h via INTRAVENOUS
  Administered 2014-06-01 – 2014-06-02 (×4): 400 ug/h via INTRAVENOUS
  Administered 2014-06-02: 100 ug/h via INTRAVENOUS
  Administered 2014-06-02 (×2): 400 ug/h via INTRAVENOUS
  Administered 2014-06-03 (×2): 100 ug/h via INTRAVENOUS
  Administered 2014-06-03: 200 ug/h via INTRAVENOUS
  Administered 2014-06-03: 400 ug/h via INTRAVENOUS
  Administered 2014-06-04: 300 ug/h via INTRAVENOUS
  Administered 2014-06-04: 100 ug/h via INTRAVENOUS
  Administered 2014-06-04: 300 ug/h via INTRAVENOUS
  Administered 2014-06-04: 100 ug/h via INTRAVENOUS
  Administered 2014-06-05 (×3): 400 ug/h via INTRAVENOUS
  Administered 2014-06-06: 350 ug/h via INTRAVENOUS
  Administered 2014-06-06: 400 ug/h via INTRAVENOUS
  Administered 2014-06-06: 250 ug/h via INTRAVENOUS
  Administered 2014-06-07: 300 ug/h via INTRAVENOUS
  Administered 2014-06-07: 175 ug/h via INTRAVENOUS
  Administered 2014-06-08: 75 ug/h via INTRAVENOUS
  Filled 2014-05-28 (×32): qty 50

## 2014-05-28 MED ORDER — DEXMEDETOMIDINE HCL IN NACL 400 MCG/100ML IV SOLN
0.0000 ug/kg/h | INTRAVENOUS | Status: AC
  Administered 2014-05-28: 1.2 ug/kg/h via INTRAVENOUS
  Administered 2014-05-28 (×3): 1 ug/kg/h via INTRAVENOUS
  Administered 2014-05-28: 0.8 ug/kg/h via INTRAVENOUS
  Administered 2014-05-29: 0.4 ug/kg/h via INTRAVENOUS
  Administered 2014-05-29 (×3): 1.2 ug/kg/h via INTRAVENOUS
  Administered 2014-05-29: 0.9 ug/kg/h via INTRAVENOUS
  Administered 2014-05-29: 1.2 ug/kg/h via INTRAVENOUS
  Administered 2014-05-29: 0.8 ug/kg/h via INTRAVENOUS
  Administered 2014-05-30: 1.098 ug/kg/h via INTRAVENOUS
  Administered 2014-05-30: 1.1 ug/kg/h via INTRAVENOUS
  Administered 2014-05-30 (×2): 1.2 ug/kg/h via INTRAVENOUS
  Administered 2014-05-30: 1.098 ug/kg/h via INTRAVENOUS
  Administered 2014-05-30: 1 ug/kg/h via INTRAVENOUS
  Administered 2014-05-30: 1.1 ug/kg/h via INTRAVENOUS
  Administered 2014-05-31 (×2): 1.3 ug/kg/h via INTRAVENOUS
  Administered 2014-05-31: 1.2 ug/kg/h via INTRAVENOUS
  Administered 2014-05-31: 1 ug/kg/h via INTRAVENOUS
  Filled 2014-05-28 (×2): qty 50
  Filled 2014-05-28: qty 100
  Filled 2014-05-28 (×10): qty 50
  Filled 2014-05-28 (×2): qty 100
  Filled 2014-05-28: qty 50
  Filled 2014-05-28: qty 100
  Filled 2014-05-28 (×4): qty 50

## 2014-05-28 MED ORDER — LIDOCAINE VISCOUS 2 % MT SOLN
OROMUCOSAL | Status: AC
  Filled 2014-05-28: qty 15

## 2014-05-28 MED ORDER — VITAL HIGH PROTEIN PO LIQD
1000.0000 mL | ORAL | Status: DC
  Administered 2014-05-28: 1000 mL
  Filled 2014-05-28: qty 1000

## 2014-05-28 MED ORDER — DEXTROSE 50 % IV SOLN
INTRAVENOUS | Status: AC
  Administered 2014-05-28: 50 mL
  Filled 2014-05-28: qty 50

## 2014-05-28 MED ORDER — FENTANYL CITRATE (PF) 100 MCG/2ML IJ SOLN
100.0000 ug | INTRAMUSCULAR | Status: DC | PRN
  Administered 2014-05-28: 100 ug via INTRAVENOUS

## 2014-05-28 MED ORDER — VITAL AF 1.2 CAL PO LIQD
1000.0000 mL | ORAL | Status: DC
  Administered 2014-05-28: 1000 mL
  Filled 2014-05-28 (×2): qty 1000

## 2014-05-28 MED ORDER — LIDOCAINE HCL (CARDIAC) 20 MG/ML IV SOLN
INTRAVENOUS | Status: AC
  Filled 2014-05-28: qty 5

## 2014-05-28 MED ORDER — FENTANYL CITRATE (PF) 100 MCG/2ML IJ SOLN
100.0000 ug | INTRAMUSCULAR | Status: DC | PRN
  Administered 2014-05-28 – 2014-06-03 (×13): 100 ug via INTRAVENOUS
  Filled 2014-05-28: qty 2

## 2014-05-28 MED ORDER — ETOMIDATE 2 MG/ML IV SOLN
INTRAVENOUS | Status: AC
  Filled 2014-05-28: qty 20

## 2014-05-28 MED ORDER — SUCCINYLCHOLINE CHLORIDE 20 MG/ML IJ SOLN
INTRAMUSCULAR | Status: DC | PRN
  Administered 2014-05-28: 60 mg via INTRAVENOUS

## 2014-05-28 NOTE — Progress Notes (Signed)
Patient ID: [name], male   DOB: 04/23/1990, 24 y.o.   MRN: [id number] Request received for evaluation of patient's pull-through percutaneous gastrostomy tube, placed initially on 04/16/2014. Originally, request was for removal but now it has  been determined that patient needs to maintain gastrostomy tube. Patient was recently seen in the ED where portion of the gastrostomy tube became disconnected and the tube itself was subsequently tied in a knot. On exam today gastrostomy tube is tied in a knot and there is no outer adapter connected to the tube. The tube was subsequently cut above the knot, a new adapter was applied as well as disc to prevent tube leaking. An Amplatz wire was used to unclog the tube and it was subsequently flushed easily with normal saline. Gastrostomy tube okay to use at this time if needed. Contact IR [phone] with any additional questions.

## 2014-05-28 NOTE — Progress Notes (Signed)
NUTRITION FOLLOW UP  Intervention:   D/C Vital HP   Initiate Vital AF 1.2 @ 20 ml/hr via peg tube and increase by 10 ml every 4 hours to goal rate of 60 ml/hr.   Tube feeding regimen provides 1728 kcal (98% of needs), 108 grams of protein, and 1166 ml of H2O.   Nutrition Dx:   Inadequate oral intake related to inability to eat as evidenced by NPO; ongoing  Goal:   Pt to meet >/= 90% of their estimated nutrition needs; not met  Monitor:   TF initiation and tolerance, weight trend, vent status/settings, labs  Assessment:   24 y.o with long admission in 04/2014 for ARDS, tstomy decannulated 04/27/14, PEG in situ re admitted 4/17 for altered mental status, which improved. Intent was to remove PEG on 4/22. He developed progressive AKI. PCCM consulted for acute resp acidosis on 4/20 after swallow study ? aspiration PMH of club foot, hemiatrophy of RLE, Klippel Feil Syndrome, scoliosis s/p harrington rod surgery (2003), Restrictive lung disease, & Tourette's syndrome / Tic disorder  - Pt with 13 lb wt loss in one month. (9%- significant for time frame) - No signs of fat or muscle wasting. - Suspect some form of malnutrition, but pt does not meet criteria at this time.   - Consult received for TF initiation and management. - Per radiology note, peg is ok to use.  Patient is currently intubated on ventilator support MV: 10.5 L/min Temp (24hrs), Avg:98.5 F (36.9 C), Min:97.9 F (36.6 C), Max:99.5 F (37.5 C)  Propofol: none  Height: Ht Readings from Last 1 Encounters:  05/26/14 _0  (1.499 m)    Weight Status:   Wt Readings from Last 1 Encounters:  05/28/14 137 lb 5.6 oz (62.3 kg)    Re-estimated needs:  Kcal: 1767 Protein: 90-105 g Fluid: 1.7 L/day  Skin: intact  Diet Order: Diet NPO time specified   Intake/Output Summary (Last 24 hours) at 05/28/14 1405 Last data filed at 05/28/14 1230  Gross per 24 hour  Intake 3333.67 ml  Output   1215 ml  Net 2118.67 ml     Last BM: 4/20   Labs:   Recent Labs Lab 05/26/14 0455 05/27/14 0414 05/28/14 1030  NA 142 147* 146*  K 5.1 4.5 3.8  CL 110 115* 119*  CO2 _1 BUN _2 CREATININE 3.86* 5.35* 6.72*  CALCIUM 8.3* 7.9* 7.5*  GLUCOSE 119* 71 72    CBG (last 3)   Recent Labs  05/27/14 2313 05/28/14 0347 05/28/14 0838  GLUCAP 71 67* 70    Scheduled Meds: . antiseptic oral rinse  7 mL Mouth Rinse QID  . chlorhexidine  15 mL Mouth Rinse BID  . feeding supplement (VITAL HIGH PROTEIN)  1,000 mL Per Tube Q24H  . heparin subcutaneous  5,000 Units Subcutaneous 3 times per day  . metoprolol tartrate  12.5 mg Oral Daily  . pantoprazole (PROTONIX) IV  40 mg Intravenous Daily  . piperacillin-tazobactam (ZOSYN)  IV  2.25 g Intravenous 4 times per day  . sodium chloride  3 mL Intravenous Q12H    Continuous Infusions: . dexmedetomidine 0.8 mcg/kg/hr (05/28/14 1238)  . dextrose 5 % and 0.45% NaCl 50 mL/hr at 05/28/14 1111  . norepinephrine (LEVOPHED) Adult infusion Stopped (05/27/14 1106)    [name] Nikolai, [place], [place]

## 2014-05-28 NOTE — Progress Notes (Signed)
Patient placed on wean mode via ventilator and desaturated to 40's due to not taking adequate volumes. Patient given positive pressure ventilation via BVM and placed back on ventilator on full support. Patient oxygen saturation returned to WNL.

## 2014-05-28 NOTE — Progress Notes (Signed)
Pt became agitated and self extubated approximately 2130. Pt has history of prior self extubation events in past. Precedex had been titrated to max concentration prescribed and had been working well in decreasing periods of agitation.  Pt was placed temporary on a non-rebreather and ambu-bag was used intermittently as well. Elink MD Deterding notified in regards to event. Anesthesia paged to reintubate pt since pt could not maintain or manage airway. Pt was successfully intubated by Anesthesia. Mother of pt notified in regards to situation. Will continue to monitor and assess pt.

## 2014-05-28 NOTE — Progress Notes (Signed)
Subjective: Interval History: sedated on vent chem pending  Objective: Vital signs in last 24 hours: Temp:  [97.9 F (36.6 C)-99.5 F (37.5 C)] 98.2 F (36.8 C) (04/22 1100) Pulse Rate:  [77-116] 88 (04/22 1100) Resp:  [19-28] 28 (04/22 1100) BP: (100-132)/(45-98) 132/98 mmHg (04/22 1100) SpO2:  [40 %-100 %] 99 % (04/22 1100) FiO2 (%):  [30 %-40 %] 30 % (04/22 0800) Weight:  [62.3 kg (137 lb 5.6 oz)] 62.3 kg (137 lb 5.6 oz) (04/22 0533) Weight change:   Intake/Output from previous day: 04/21 0701 - 04/22 0700 In: 3538 [I.V.:3368; NG/GT:120; IV Piggyback:50] Out: 1190 [Urine:750; Emesis/NG output:250; Drains:190] Intake/Output this shift: Total I/O In: 678.4 [I.V.:528.4; IV Piggyback:150] Out: 300 [Urine:300]  General appearance: sedated on vent, not responsive.  small extrem, for torso Resp: rales bibasilar, rhonchi bilaterally and decreased bs Cardio: S1, S2 normal GI: very few bs, mod distension, PEG mid abdm Extremities: 1+  Lab Results:  Recent Labs  05/28/14 0355  WBC 11.0*  HGB 10.4*  HCT 34.6*  PLT 228   BMET:  Recent Labs  05/26/14 0455 05/27/14 0414  NA 142 147*  K 5.1 4.5  CL 110 115*  CO2 25 24  GLUCOSE 119* 71  BUN 8 10  CREATININE 3.86* 5.35*  CALCIUM 8.3* 7.9*   No results for input(s): PTH in the last 72 hours. Iron Studies: No results for input(s): IRON, TIBC, TRANSFERRIN, FERRITIN in the last 72 hours.  Studies/Results: Dg Chest Port 1 View  05/28/2014   CLINICAL DATA:  Respiratory failure.  EXAM: PORTABLE CHEST - 1 VIEW  COMPARISON:  05/27/2014  FINDINGS: Support devices are in stable position. Cardiomegaly. Bilateral airspace opacities, most pronounced in the left lower lobe are unchanged. Congenital deformity of the thoracic cage, stable.  IMPRESSION: No significant change.   Electronically Signed   By: Charlett Nose M.D.   On: 05/28/2014 06:40   Dg Chest Port 1 View  05/27/2014   CLINICAL DATA:  Adjustment of ETT.  EXAM: PORTABLE  CHEST - 1 VIEW  COMPARISON:  05/27/2014 and 05/26/2014  FINDINGS: Endotracheal tube has tip 1.7 cm above the carina. Nasogastric tube courses into the region of the stomach and off the inferior part of the film. Right IJ central venous catheter is seen over the SVC at the level of the carina although tip not visualized. Note that this IJ line is looped over the soft tissues of the neck.  Lungs are hypoinflated with persistent left retrocardiac opacification. Mild stable prominence of the right perihilar markings. Stable cardiomegaly. Stable pop spinal fixation hardware. Remainder the exam is unchanged.  IMPRESSION: Prominence of the right perihilar markings with left retrocardiac opacification unchanged.  Stable cardiomegaly.  Tubes and lines as described. Note that the right IJ central venous catheter is looped over the soft tissues of the neck.  These results will be called to the ordering clinician or representative by the Radiologist Assistant, and communication documented in the PACS or zVision Dashboard.   Electronically Signed   By: Elberta Fortis M.D.   On: 05/27/2014 21:30   Portable Chest Xray  05/27/2014   CLINICAL DATA:  Acute respiratory failure  EXAM: PORTABLE CHEST - 1 VIEW  COMPARISON:  05/26/2014  FINDINGS: Endotracheal tube tip not well seen due to overlying spinal instrumentation. Right jugular central venous catheter tip in the SVC with the tip not visualized. NG tube in the stomach.  Extensive chest deformity is chronic. Bilateral airspace disease is unchanged. Left lower lobe  atelectasis is unchanged.  IMPRESSION: Endotracheal tube tip not well seen due to overlying instrumentation in the spine  Bilateral airspace disease in left lower lobe atelectasis stable.   Electronically Signed   By: Marlan Palauharles  Clark M.D.   On: 05/27/2014 07:12   Dg Chest Port 1 View  05/26/2014   CLINICAL DATA:  Post bronchoscopy  EXAM: PORTABLE CHEST - 1 VIEW  COMPARISON:  Portable exam 1658 hours compared to 1530  hours  FINDINGS: Tip of endotracheal tube projects approximately 14 mm above carina.  Nasogastric tube extends into stomach.  RIGHT jugular central venous catheter tip projects over cavoatrial junction.  Extensive spinal fixation rods limit assessment of the ET tube.  Heart is enlarged with pulmonary vascular congestion.  Improved aeration in LEFT lung post bronchoscopy.  RIGHT lung stable.  No pneumothorax.  IMPRESSION: Improved aeration in LEFT lung versus earlier exam.   Electronically Signed   By: Ulyses SouthwardMark  Boles M.D.   On: 05/26/2014 17:11   Portable Chest Xray  05/26/2014   CLINICAL DATA:  Endotracheal tube placement and right IJ central line placement. Pneumonia. Aspiration.  EXAM: PORTABLE CHEST - 1 VIEW 3:30 p.m.  COMPARISON:  05/26/2014 at 12:19 p.m. and 05/24/2014 and CT scan of the chest dated 05/24/2014  FINDINGS: Endotracheal tube tip is at the T4 level at the carina. I recommend retracting approximately 2 cm. OG tube tip is in the distal stomach. IJ catheter tip is just above the cavoatrial junction in good position. No pneumothorax.  There is new complete opacification of the left hemi thorax with some air bronchograms.  No acute abnormalities of the right lung. Chronic deformity of the bony thorax.  Gastrostomy tube in place.  Spinal rods and wires in place.  IMPRESSION: 1. Endotracheal tube is at the carina and needs to be retracted approximately 2 cm. 2. IJ catheter appears in good position with no pneumothorax. 3. New complete opacification of the left lung.  Critical Value/emergent results were called by telephone at the time of interpretation on 05/26/2014 at 4:17 pm to Cruzita LedererMike Morgan, RN , who verbally acknowledged these results.   Electronically Signed   By: Francene BoyersJames  Maxwell M.D.   On: 05/26/2014 16:19   Dg Chest Port 1 View  05/26/2014   CLINICAL DATA:  24 year old male with shortness of breath since this morning. Initial encounter.  EXAM: PORTABLE CHEST - 1 VIEW  COMPARISON:  Chest CT  05/24/2014 and earlier.  FINDINGS: Portable AP semi upright view at 1219 hours. 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 Posterior spinal rods appears stable and intact. Cardiomegaly and mediastinal contours are stable. Stable lung parenchyma. No pneumothorax or acute pulmonary opacity identified.  Left upper quadrant percutaneous gastrostomy re- identified. Increased proximal stomach gas.  IMPRESSION: Stable.  No acute cardiopulmonary abnormality.   Electronically Signed   By: Odessa FlemingH  Hall M.D.   On: 05/26/2014 12:39    I have reviewed the patient's current medications.  Assessment/Plan: 1  AKI contrast.  Nonoliguric.  Vol xs. Acid/base ok. Awaiting chem. CO2 retention.  2  VDRF restrictive lung dz, CO2 retention per CCM 3 Klippel Feil 4 Tourettes 5 Tic 6 Ileus 7 low glu P await chem , minimize fluids.     LOS: 5 days   [name] L 05/28/2014,11:32 AM

## 2014-05-28 NOTE — Progress Notes (Signed)
PULMONARY / CRITICAL CARE MEDICINE   Name: Aaron Aaron MRN: [id number] DOB: 12/07/1990    ADMISSION DATE:  06/03/14 CONSULTATION DATE:  05/28/2014  REFERRING MD :  Aaron Aaron  CHIEF COMPLAINT:  resp distress  INITIAL PRESENTATION: 24 y.o with long admission in 04/2014 for ARDS, tstomy decannulated 04/27/14, PEG in situ re admitted 4/17 for altered mental status, which improved. Intent was to remove PEG on 4/22. He developed progressive AKI. PCCM consulted for acute resp acidosis on 4/20 after swallow study ? aspiration PMH of club foot, hemiatrophy of RLE, Klippel Feil Syndrome, scoliosis s/p harrington rod surgery (2003), Restrictive lung disease, & Tourette's syndrome / Tic disorder  STUDIES:  CT chest/ abd/pelvis 4/18 BLL atx/ pna  SIGNIFICANT EVENTS: 4/20 swallow eval >> failed   HISTORY OF PRESENT ILLNESS:  23 y.o with long admission in 04/2014 for ARDS, tstomy decannulated 04/27/14, PEG in situ re admitted 4/17 for altered mental status, which improved. Intent was to remove PEG on 4/22. He developed progressive AKI. PCCM consulted for acute resp acidosis on 4/20 after swallow study ? aspiration PMH of club foot, hemiatrophy of RLE, Klippel Feil Syndrome, scoliosis s/p harrington rod surgery (2003), Restrictive lung disease, & Tourette's syndrome / Tic disorder His priro admit was due to ARDS due to pna He is very functional & goes to [employer], lives with mom    SUBJECTIVE:  Failed weaning   VITAL SIGNS: Temp:  [97.9 F (36.6 C)-99.5 F (37.5 C)] 98.2 F (36.8 C) (04/22 1100) Pulse Rate:  [77-116] 88 (04/22 1100) Resp:  [19-28] 28 (04/22 1100) BP: (100-132)/(45-98) 132/98 mmHg (04/22 1100) SpO2:  [40 %-100 %] 99 % (04/22 1100) FiO2 (%):  [30 %-40 %] 30 % (04/22 0800) Weight:  [62.3 kg (137 lb 5.6 oz)] 62.3 kg (137 lb 5.6 oz) (04/22 0533) HEMODYNAMICS:   VENTILATOR SETTINGS: Vent Mode:  [-] PRVC FiO2 (%):  [30 %-40 %] 30 % Set Rate:  [28 bmp] 28 bmp Vt Set:  [380 mL] 380  mL PEEP:  [5 cmH20] 5 cmH20 Plateau Pressure:  [22 cmH20-31 cmH20] 26 cmH20 INTAKE / OUTPUT:  Intake/Output Summary (Last 24 hours) at 05/28/14 1152 Last data filed at 05/28/14 1100  Gross per 24 hour  Intake 3651.4 ml  Output   1290 ml  Net 2361.4 ml    PHYSICAL EXAMINATION: General:  Acutely ill,intubated. No distress.  Neuro:  Non focal. No follow commands HEENT:  Class 3 airway, short neck, no jvd, orally intubated  Cardiovascular:  s1s2 tachy Lungs:  Decreased BLL bases Abdomen:  Soft, non tender, PEG site clean. PEG tied in knot  Musculoskeletal:  No edema Skin:  Warm, no rash  LABS:  CBC  Recent Labs Lab 05/24/14 0518 05/25/14 1008 05/28/14 0355  WBC 5.9 6.8 11.0*  HGB 14.8 14.0 10.4*  HCT 47.4 46.1 34.6*  PLT 310 297 228   Coag's  Recent Labs Lab 10-10-2014 1616  APTT 30  INR 1.09   BMET  Recent Labs Lab 05/26/14 0455 05/27/14 0414 05/28/14 1030  NA 142 147* 146*  K 5.1 4.5 3.8  CL 110 115* 119*  CO2 25 24 21   BUN 8 10 18   CREATININE 3.86* 5.35* 6.72*  GLUCOSE 119* 71 72   Electrolytes  Recent Labs Lab 05/26/14 0455 05/27/14 0414 05/28/14 1030  CALCIUM 8.3* 7.9* 7.5*   Sepsis Markers  Recent Labs Lab 10-10-2014 1040 10-10-2014 1307 10-10-2014 1616  LATICACIDVEN 4.23* 1.09  --   PROCALCITON  --   --  <  0.10   ABG  Recent Labs Lab 05/27/14 0857 05/27/14 1130 05/28/14 0328  PHART 7.178* 7.260* 7.317*  PCO2ART 61.0* 49.1* 38.6  PO2ART 82.2 81.5 149.0*   Liver Enzymes  Recent Labs Lab 29-May-2014 1030 05/24/14 0518  AST 30 27  ALT 19 18  ALKPHOS 75 65  BILITOT 0.5 0.8  ALBUMIN 4.5 4.2   Cardiac Enzymes No results for input(s): TROPONINI, PROBNP in the last 168 hours. Glucose  Recent Labs Lab 05/27/14 1611 05/27/14 1926 05/27/14 2012 05/27/14 2313 05/28/14 0347 05/28/14 0838  GLUCAP 84 64* 106* 71 67* 70    Imaging Dg Chest Port 1 View  05/27/2014   CLINICAL DATA:  Adjustment of ETT.  EXAM: PORTABLE CHEST - 1  VIEW  COMPARISON:  05/27/2014 and 05/26/2014  FINDINGS: Endotracheal tube has tip 1.7 cm above the carina. Nasogastric tube courses into the region of the stomach and off the inferior part of the film. Right IJ central venous catheter is seen over the SVC at the level of the carina although tip not visualized. Note that this IJ line is looped over the soft tissues of the neck.  Lungs are hypoinflated with persistent left retrocardiac opacification. Mild stable prominence of the right perihilar markings. Stable cardiomegaly. Stable pop spinal fixation hardware. Remainder the exam is unchanged.  IMPRESSION: Prominence of the right perihilar markings with left retrocardiac opacification unchanged.  Stable cardiomegaly.  Tubes and lines as described. Note that the right IJ central venous catheter is looped over the soft tissues of the neck.  These results will be called to the ordering clinician or representative by the Radiologist Assistant, and communication documented in the PACS or zVision Dashboard.   Electronically Signed   By: Elberta Fortis M.D.   On: 05/27/2014 21:30   Portable Chest Xray  05/27/2014   CLINICAL DATA:  Acute respiratory failure  EXAM: PORTABLE CHEST - 1 VIEW  COMPARISON:  05/26/2014  FINDINGS: Endotracheal tube tip not well seen due to overlying spinal instrumentation. Right jugular central venous catheter tip in the SVC with the tip not visualized. NG tube in the stomach.  Extensive chest deformity is chronic. Bilateral airspace disease is unchanged. Left lower lobe atelectasis is unchanged.  IMPRESSION: Endotracheal tube tip not well seen due to overlying instrumentation in the spine  Bilateral airspace disease in left lower lobe atelectasis stable.   Electronically Signed   By: Marlan Palau M.D.   On: 05/27/2014 07:12     ASSESSMENT / PLAN:  PULMONARY OETT 4/20 >>  A:Acute resp failure, hypercarbic P:   Vent support, 8cc/kg F/u abg PAD protocol Daily SBT    CARDIOVASCULAR CVL A:  Hypertension w/ intermittent hypotension P:  Hold clonidine May need pressors depending on improvement after lightening sedation and improvement in acidosis  RENAL  A:   AKI - ATN + contrast injury Metabolic acidosis Hypernatremia Making urine P:   Await am labs nephro following.  Suspect he will make renal recovery Need to avoid nephroxins or hypotension  GASTROINTESTINAL A:   High aspiration risk PEG was supposed to come out 4/22 P:   Will have IR replace GT Start nutrition  HEMATOLOGIC A:  No issues P:  Trend CBC Transfuse per usual ICU protocol   INFECTIOUS A:  Aspiration pna/ HCAP P:   BCx2 4/20 >> UC 4/20 >>neg Sputum4/20 >>  Abx: : zosyn  4/20 >> Vanc 4/20 >>4/22  ENDOCRINE  A:  No issues   P:   CBG while npo  NEUROLOGIC A:   Klippel Feil Syndrome ADD P:   RASS goal: -1 Transition to precedex. Would like to avoid prolonged  Hold aderall   FAMILY  - Updates: grandma at bedside  - Inter-disciplinary family meet or Palliative Care meeting due by:  4/27  NP SUMMARY Looks a little better. Still low lung volume on left, but improved. He desaturated w/ SBT so not ready yet for extubation. Need to see his am chemistry. Plan for today: replace PEG, f/u chemistry, d/c vanc to try to avoid nephrotoxins, keep euvolemic, and cont supportive care. Will try to transition him to precedex.   Simonne Martinet ACNP-BC [facility] Pulmonary/Critical Care Pager # [phone] OR # [phone] if no answer 05/28/2014, 11:52 AM   Attending Note:  I have examined patient, reviewed labs, studies and notes. I have discussed the case with Kreg Shropshire, and I agree with the data and plans as amended above. PNA and ALI due to aspiration. Acute renal failure that is worsening this am. His CXR and lung mechanics are better this am. Will continue to support. Appreciate Renal's assistance. Hopefully UOP will pick up, S Cr will show recovery.  Independent  critical care time is 45 minutes.   Levy Pupa, MD, PhD 05/28/2014, 11:52 AM [place] [facility] Critical [facility] [phone] or if no answer [phone]

## 2014-05-29 ENCOUNTER — Inpatient Hospital Stay (HOSPITAL_COMMUNITY): Payer: Medicaid Other

## 2014-05-29 DIAGNOSIS — Q761 Klippel-Feil syndrome: Secondary | ICD-10-CM

## 2014-05-29 DIAGNOSIS — J9600 Acute respiratory failure, unspecified whether with hypoxia or hypercapnia: Secondary | ICD-10-CM

## 2014-05-29 LAB — COMPREHENSIVE METABOLIC PANEL
ALT: 22 U/L (ref 0–53)
AST: 20 U/L (ref 0–37)
Albumin: 2.8 g/dL — ABNORMAL LOW (ref 3.5–5.2)
Alkaline Phosphatase: 61 U/L (ref 39–117)
Anion gap: 7 (ref 5–15)
BILIRUBIN TOTAL: 0.4 mg/dL (ref 0.3–1.2)
BUN: 21 mg/dL (ref 6–23)
CHLORIDE: 119 mmol/L — AB (ref 96–112)
CO2: 21 mmol/L (ref 19–32)
Calcium: 8.2 mg/dL — ABNORMAL LOW (ref 8.4–10.5)
Creatinine, Ser: 7.06 mg/dL — ABNORMAL HIGH (ref 0.50–1.35)
GFR, EST AFRICAN AMERICAN: 11 mL/min — AB (ref >90)
GFR, EST NON AFRICAN AMERICAN: 10 mL/min — AB (ref >90)
GLUCOSE: 209 mg/dL — AB (ref 70–99)
POTASSIUM: 4.2 mmol/L (ref 3.5–5.1)
Sodium: 147 mmol/L — ABNORMAL HIGH (ref 135–145)
Total Protein: 5.7 g/dL — ABNORMAL LOW (ref 6.0–8.3)

## 2014-05-29 LAB — GLUCOSE, CAPILLARY
GLUCOSE-CAPILLARY: 209 mg/dL — AB (ref 70–99)
GLUCOSE-CAPILLARY: 111 mg/dL — AB (ref 70–99)
Glucose-Capillary: 103 mg/dL — ABNORMAL HIGH (ref 70–99)
Glucose-Capillary: 140 mg/dL — ABNORMAL HIGH (ref 70–99)
Glucose-Capillary: 152 mg/dL — ABNORMAL HIGH (ref 70–99)
Glucose-Capillary: 206 mg/dL — ABNORMAL HIGH (ref 70–99)

## 2014-05-29 LAB — CULTURE, RESPIRATORY

## 2014-05-29 LAB — CULTURE, BLOOD (ROUTINE X 2)
Culture: NO GROWTH
Culture: NO GROWTH

## 2014-05-29 LAB — CBC
HEMATOCRIT: 39.9 % (ref 39.0–52.0)
HEMOGLOBIN: 12.2 g/dL — AB (ref 13.0–17.0)
MCH: 29.5 pg (ref 26.0–34.0)
MCHC: 30.6 g/dL (ref 30.0–36.0)
MCV: 96.4 fL (ref 78.0–100.0)
Platelets: 232 10*3/uL (ref 150–400)
RBC: 4.14 MIL/uL — AB (ref 4.22–5.81)
RDW: 13.8 % (ref 11.5–15.5)
WBC: 9.5 10*3/uL (ref 4.0–10.5)

## 2014-05-29 LAB — VANCOMYCIN, RANDOM: Vancomycin Rm: 28.2 ug/mL

## 2014-05-29 LAB — PHOSPHORUS: PHOSPHORUS: 5.3 mg/dL — AB (ref 2.3–4.6)

## 2014-05-29 LAB — CULTURE, RESPIRATORY W GRAM STAIN

## 2014-05-29 MED ORDER — SODIUM CHLORIDE 0.9 % IV BOLUS (SEPSIS)
750.0000 mL | Freq: Once | INTRAVENOUS | Status: AC
Start: 2014-05-29 — End: 2014-05-29
  Administered 2014-05-29: 750 mL via INTRAVENOUS

## 2014-05-29 MED ORDER — VITAL AF 1.2 CAL PO LIQD
1000.0000 mL | ORAL | Status: DC
  Administered 2014-05-29 – 2014-06-04 (×7): 1000 mL
  Filled 2014-05-29 (×8): qty 1000

## 2014-05-29 MED ORDER — PANTOPRAZOLE SODIUM 40 MG PO PACK
40.0000 mg | PACK | Freq: Every day | ORAL | Status: DC
  Administered 2014-05-30 – 2014-06-11 (×12): 40 mg
  Filled 2014-05-29 (×13): qty 20

## 2014-05-29 MED ORDER — NOREPINEPHRINE BITARTRATE 1 MG/ML IV SOLN
0.0000 ug/min | INTRAVENOUS | Status: DC
  Administered 2014-05-29: 10 ug/min via INTRAVENOUS
  Administered 2014-05-30 – 2014-05-31 (×2): 5 ug/min via INTRAVENOUS
  Filled 2014-05-29 (×4): qty 16

## 2014-05-29 MED ORDER — POLYETHYLENE GLYCOL 3350 17 G PO PACK: 17.0000 g | PACK | Freq: Every day | ORAL | Status: DC | PRN

## 2014-05-29 MED ORDER — INSULIN ASPART 100 UNIT/ML ~~LOC~~ SOLN
0.0000 [IU] | SUBCUTANEOUS | Status: DC
  Administered 2014-05-29 – 2014-05-30 (×2): 2 [IU] via SUBCUTANEOUS
  Administered 2014-05-30: 1 [IU] via SUBCUTANEOUS
  Administered 2014-05-30 (×3): 2 [IU] via SUBCUTANEOUS
  Administered 2014-05-31: 3 [IU] via SUBCUTANEOUS
  Administered 2014-05-31 (×2): 2 [IU] via SUBCUTANEOUS
  Administered 2014-05-31: 1 [IU] via SUBCUTANEOUS
  Administered 2014-05-31 (×2): 2 [IU] via SUBCUTANEOUS
  Administered 2014-06-01: 1 [IU] via SUBCUTANEOUS
  Administered 2014-06-01 (×2): 2 [IU] via SUBCUTANEOUS
  Administered 2014-06-03 – 2014-06-05 (×3): 1 [IU] via SUBCUTANEOUS
  Administered 2014-06-05: 2 [IU] via SUBCUTANEOUS
  Administered 2014-06-06 – 2014-06-09 (×7): 1 [IU] via SUBCUTANEOUS

## 2014-05-29 MED ORDER — BISACODYL 10 MG RE SUPP
10.0000 mg | Freq: Once | RECTAL | Status: AC
  Administered 2014-05-29: 10 mg via RECTAL
  Filled 2014-05-29: qty 1

## 2014-05-29 MED ORDER — BISACODYL 10 MG RE SUPP: 10.0000 mg | Freq: Every day | RECTAL | Status: DC | PRN

## 2014-05-29 MED ORDER — SODIUM CHLORIDE 0.9 % IV SOLN
INTRAVENOUS | Status: DC | PRN
  Administered 2014-06-02: 11:00:00 via INTRA_ARTERIAL

## 2014-05-29 MED ORDER — FREE WATER
200.0000 mL | Freq: Four times a day (QID) | Status: DC
  Administered 2014-05-29 – 2014-06-09 (×31): 200 mL

## 2014-05-29 MED ORDER — SODIUM CHLORIDE 0.9 % IV BOLUS (SEPSIS)
750.0000 mL | Freq: Once | INTRAVENOUS | Status: AC
  Administered 2014-05-29: 750 mL via INTRAVENOUS

## 2014-05-29 MED ORDER — DEXTROSE 5 % IV SOLN
INTRAVENOUS | Status: DC
  Administered 2014-05-29: 23:00:00 via INTRAVENOUS
  Administered 2014-05-30: 1000 mL via INTRAVENOUS
  Administered 2014-05-31 (×2): via INTRAVENOUS

## 2014-05-29 MED ORDER — ACETAMINOPHEN 325 MG PO TABS
650.0000 mg | ORAL_TABLET | Freq: Four times a day (QID) | ORAL | Status: DC | PRN
  Administered 2014-06-09 – 2014-06-10 (×4): 650 mg
  Filled 2014-05-29 (×4): qty 2

## 2014-05-29 NOTE — Progress Notes (Signed)
PULMONARY / CRITICAL CARE MEDICINE   Name: [name] MRN: [id number] DOB: 12/05/1990    ADMISSION DATE:  05/11/2014 CONSULTATION DATE:  05/29/2014  REFERRING MD :  [name]  CHIEF COMPLAINT:  resp distress  INITIAL PRESENTATION: 24 y.o with long admission in 04/2014 for ARDS, tstomy decannulated 04/27/14, PEG in situ re admitted 4/17 for altered mental status, which improved. Intent was to remove PEG on 4/22. He developed progressive AKI. PCCM consulted for acute resp acidosis on 4/20 after swallow study ? Aspiration. PMH of club foot, hemiatrophy of RLE, Klippel Feil Syndrome, scoliosis s/p harrington rod surgery (2003), Restrictive lung physiology, Tourette's syndrome He is very functional & goes to [employer] [facility], lives with mom  STUDIES:  CT chest/ abd/pelvis 4/18 BLL atx/ pna  SIGNIFICANT EVENTS: 4/20 swallow eval >> failed 4/20 intubated for resp acidosis 4/23 Failed self extubation due to rapid and severe hypoxemia  SUBJECTIVE:  RASS -4. Not F/C  VITAL SIGNS: Temp:  [95.4 F (35.2 C)-98.6 F (37 C)] 96.8 F (36 C) (04/23 1338) Pulse Rate:  [64-133] 72 (04/23 1338) Resp:  [0-28] 20 (04/23 1338) BP: (48-158)/(25-109) 89/65 mmHg (04/23 1338) SpO2:  [93 %-100 %] 97 % (04/23 1338) FiO2 (%):  [30 %] 30 % (04/23 1338) Weight:  [66.8 kg (147 lb 4.3 oz)] 66.8 kg (147 lb 4.3 oz) (04/23 0500) HEMODYNAMICS: CVP:  [9 mmHg-18 mmHg] 17 mmHg VENTILATOR SETTINGS: Vent Mode:  [-] PRVC FiO2 (%):  [30 %] 30 % Set Rate:  [28 bmp] 28 bmp Vt Set:  [380 mL] 380 mL PEEP:  [5 cmH20] 5 cmH20 Plateau Pressure:  [28 cmH20-34 cmH20] 34 cmH20 INTAKE / OUTPUT:  Intake/Output Summary (Last 24 hours) at 05/29/14 1346 Last data filed at 05/29/14 1319  Gross per 24 hour  Intake 3422.49 ml  Output   1440 ml  Net 1982.49 ml    PHYSICAL EXAMINATION: General:  Acutely ill,intubated. No distress.  Neuro:  Non focal. No follow commands HEENT:  Class 3 airway, short neck, no jvd, orally intubated   Cardiovascular:  s1s2 tachy Lungs:  Decreased BLL bases Abdomen:  Soft, non tender, PEG site clean. PEG tied in knot  Musculoskeletal:  No edema Skin:  Warm, no rash  LABS:  CBC  Recent Labs Lab 05/25/14 1008 05/28/14 0355 05/29/14 0445  WBC 6.8 11.0* 9.5  HGB 14.0 10.4* 12.2*  HCT 46.1 34.6* 39.9  PLT 297 228 232   Coag's  Recent Labs Lab 05/10/2014 1616  APTT 30  INR 1.09   BMET  Recent Labs Lab 05/27/14 0414 05/28/14 1030 05/29/14 0445  NA 147* 146* 147*  K 4.5 3.8 4.2  CL 115* 119* 119*  CO2 BUN CREATININE 5.35* 6.72* 7.06*  GLUCOSE 71 72 209*   Electrolytes  Recent Labs Lab 05/27/14 0414 05/28/14 1030 05/29/14 0445  CALCIUM 7.9* 7.5* 8.2*  PHOS  --   --  5.3*   Sepsis Markers  Recent Labs Lab 05/25/2014 1040 05/22/2014 1307 05/20/2014 1616  LATICACIDVEN 4.23* 1.09  --   PROCALCITON  --   --  <0.10   ABG  Recent Labs Lab 05/27/14 0857 05/27/14 1130 05/28/14 0328  PHART 7.178* 7.260* 7.317*  PCO2ART 61.0* 49.1* 38.6  PO2ART 82.2 81.5 149.0*   Liver Enzymes  Recent Labs Lab 05/19/2014 1030 05/24/14 0518 05/29/14 0445  AST ALT ALKPHOS 75 65 61  BILITOT 0.5 0.8 0.4  ALBUMIN 4.5  4.2 2.8*   Cardiac Enzymes No results for input(s): TROPONINI, PROBNP in the last 168 hours. Glucose  Recent Labs Lab 05/28/14 1626 05/28/14 2124 05/28/14 2345 05/29/14 0511 05/29/14 0817 05/29/14 1221  GLUCAP 114* 160* 140* 209* 206* 152*    CXR: Moderate enlargement of the cardiopericardial silhouette, stable. 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 Prior trach tube P:   Cont full vent support - settings reviewed and/or adjusted Cont vent bundle Daily SBT if/when meets criteria   CARDIOVASCULAR CVL A:  Hypertension w/ intermittent hypotension P:  Holding  clonidine MAP goal > 60 mmHg  RENAL  A:   AKI, nonoliguric  No idication for HD presently Metabolic acidosis, resolved Hypernatremia P:   Renal service following Monitor BMET intermittently Monitor I/Os Correct electrolytes as indicated Increase free water 4/23  GASTROINTESTINAL A:   Chronic G tube Elevated gastric residuals Constipation P:   SUP: enteral PPI Cont TFs PRN dulcolax and miralax 4/23  HEMATOLOGIC A:   No issues P:  DVT px: SQ heparin Monitor CBC intermittently Transfuse per usual ICU guidelines  INFECTIOUS A:   Suspected PNA  CXR has cleared P:   DC abx and follow  ENDOCRINE A:   Hyperglycemia without prior dx of DM P:   SSI initiated 4/23  NEUROLOGIC A:   Klippel Feil Syndrome ADD P:   RASS goal: -1, -2 Cont dex and fent gtts Holding adderall   FAMILY  - Updates: grandma at bedside  - Inter-disciplinary family meet or Palliative Care meeting due by:  4/27  CCM time: 40 mins  Billy Fischeravid Simonds, MD ; [facility] service Mobile [phone].  After 5:30 PM or weekends, call [phone]

## 2014-05-29 NOTE — Progress Notes (Signed)
Attempted Arterial Line placement X 3 attempts, with 2 therapist. Unsuccessful, RN informed.

## 2014-05-29 NOTE — Progress Notes (Signed)
eLink Physician-Brief Progress Note Patient Name: [name] DOB: 04/01/1990 MRN: [id number]   Date of Service  05/29/2014  HPI/Events of Note  Review of KUB reveals no acute intra-abdominal pathology to my read. Await radiology reading.   eICU Interventions  Continue present management.      Intervention Category Intermediate Interventions: Diagnostic test evaluation  [name],[name] 05/29/2014, 3:35 PM

## 2014-05-29 NOTE — Progress Notes (Signed)
eLink Physician-Brief Progress Note Patient Name: [name] DOB: 03/22/1990 MRN: [id number]   Date of Service  05/29/2014  HPI/Events of Note  Hyperglycemia  eICU Interventions  Placed on q4 hour SSI, sensitive scale given AKI     Intervention Category Intermediate Interventions: Hyperglycemia - evaluation and treatment  [name] 05/29/2014, 5:58 AM

## 2014-05-29 NOTE — Progress Notes (Signed)
Subjective: Interval History: sedated on vent and now on pressors  Objective: Vital signs in last 24 hours: Temp:  [95.4 F (35.2 C)-98.4 F (36.9 C)] 98.1 F (36.7 C) (04/23 2145) Pulse Rate:  [64-133] 86 (04/23 2037) Resp:  [0-28] 18 (04/23 2145) BP: (48-174)/(25-144) 129/62 mmHg (04/23 2145) SpO2:  [93 %-100 %] 99 % (04/23 2145) FiO2 (%):  [30 %] 30 % (04/23 2037) Weight:  [66.8 kg (147 lb 4.3 oz)] 66.8 kg (147 lb 4.3 oz) (04/23 0500) Weight change: 4.5 kg (9 lb 14.7 oz)  Intake/Output from previous day: 04/22 0701 - 04/23 0700 In: 3079.7 [I.V.:2033.7; NG/GT:666; IV Piggyback:350] Out: 1605 [Urine:1605] Intake/Output this shift: Total I/O In: -  Out: 115 [Urine:115]  General appearance: sedated on vent, not responsive.  small extrem, for torso Resp: rales bibasilar, rhonchi bilaterally and decreased bs Cardio: S1, S2 normal GI: very few bs, mod distension, PEG mid abdm Extremities: 1+  Lab Results:  Recent Labs  05/28/14 0355 05/29/14 0445  WBC 11.0* 9.5  HGB 10.4* 12.2*  HCT 34.6* 39.9  PLT 228 232   BMET:   Recent Labs  05/28/14 1030 05/29/14 0445  NA 146* 147*  K 3.8 4.2  CL 119* 119*  CO2 21 21  GLUCOSE 72 209*  BUN 18 21  CREATININE 6.72* 7.06*  CALCIUM 7.5* 8.2*   No results for input(s): PTH in the last 72 hours. Iron Studies: No results for input(s): IRON, TIBC, TRANSFERRIN, FERRITIN in the last 72 hours.   I have reviewed the patient's current medications.  Assessment/Plan: 1  AKI contrast.  Nonoliguric.  Vol xs. Acid/base ok. Creat rising. CO2 retention.  2  VDRF restrictive lung dz, CO2 retention per CCM 3 Klippel Feil 4 Tourettes 5 Ileus 6 low glu 7 ^Na P RRT soon if not better, free water w D5W     LOS: 6 days   [name] 05/29/2014,10:29 PM

## 2014-05-29 NOTE — Progress Notes (Signed)
ANTIBIOTIC CONSULT NOTE - FOLLOW UP  Pharmacy Consult for Zosyn, Vancomycin Indication: rule out sepsis  No Known Allergies  Patient Measurements: Height: 4\' 11"  (149.9 cm) Weight: 147 lb 4.3 oz (66.8 kg) IBW/kg (Calculated) : 47.7  Vital Signs: Temp: 95.5 F (35.3 C) (04/23 0700) Temp Source: Oral (04/23 0327) BP: 109/79 mmHg (04/23 0700) Pulse Rate: 65 (04/23 0700) Intake/Output from previous day: 04/22 0701 - 04/23 0700 In: 3079.7 [I.V.:2033.7; NG/GT:666; IV Piggyback:350] Out: 880 [Urine:880]  Labs:  Recent Labs  05/27/14 0414 05/28/14 0355 05/28/14 1030 05/29/14 0445  WBC  --  11.0*  --  9.5  HGB  --  10.4*  --  12.2*  PLT  --  228  --  232  CREATININE 5.35*  --  6.72* 7.06*   Estimated Creatinine Clearance: 12.7 mL/min (by C-G formula based on Cr of 7.06).  Recent Labs  05/27/14 0414 05/29/14 0716  VANCOTROUGH 38.9*  --   VANCORANDOM  --  28.2   Assessment: 23 yoM admitted 4/17 with AMS.  He had recent hospitalization in 04/2014 for ARDS, tracheostomy decannulated 04/27/14, with PEG insitu.  Other PMH of club foot, hemiatrophy of RLE, Klippel Feil Syndrome, scoliosis s/p harrington rod surgery (2003), Restrictive lung disease, & Tourette's syndrome / Tic disorder.  Pharmacy was initially consulted to dose Vancomycin and Zosyn since 4/17 for code sepsis and pneumonia.    Significant events: 4/17 Admitted, Vanc/Zosyn started 4/19 New AKI with Supratherapeutic VT, Vanc d/c. 4/20  Acute respiratory distress requiring intubation, pharmacy RE-consulted to resume Vancomcyin.  4/22 Pt self-extubated, re-intubated by anesthesia  4/17 >> Vancomycin >> 4/17 >> Zosyn >>   4/17 blood x 2: ngtd 4/17 urine: NGF 4/20 trach aspirate: ngtd  Vanc levels: 4/19 1000 Vanc trough = 61.5, 1g dose given after VT was drawn 4/20 0500 VRm = 46.3 4/21 0500 VRm = 38.9 4/23 0500 VRm = 28.2  Today, 05/29/2014: Day #7 Vancomycin and Zosyn  Tmax: afebrile  WBC: WNL,  9.5  Renal: SCr quickly increasing (0.8 > 1.75 > 3.86 > 5.35 > 6.72 > 7.06), CrCl ~ 13 ml/min   SCr continues to trend up.  Vancomycin level is decreasing, but remains supra-therapeutic.  Pharmacy will continue to hold doses and resume dosing when vanc levels return to therapeutic range.  Goal of Therapy:  Vancomycin trough level 15-20 mcg/ml Appropriate abx dosing, eradication of infection.   Plan:   Continue Zosyn 2.25g IV Q6H  Hold further Vancomycin doses  Recheck random vancomycin level.  Follow up renal fxn, culture results, and clinical course.   Lynann Beaverhristine Charla Criscione PharmD, BCPS Pager [phone] 05/29/2014 7:12 AM

## 2014-05-29 NOTE — Plan of Care (Signed)
Problem: Phase I Progression Outcomes Goal: Code status addressed with pt/family Outcome: Completed/Met Date Met:  05/29/14 Full Code status per request of family. Goal: Pain controlled with appropriate interventions Outcome: Progressing Pt remains on Fentanyl gtt for sedation and pain medication. Goal: Hemodynamically stable Outcome: Progressing Pt became hypotensive during shift, sedation medicine altered and NS IV boluses given as ordered.  Noted CVP increased form 17 to 30.  Stopped second bolus and called placed to Dr. Alva Garnet to report.  Pt anuric during hypotensive period.  Received new orders including an order for Levophed and a stat Echo with contrast.  Dr. Alva Garnet stated that he would talk with Vascular lab to request Echo to be performed tonight.  Levophed immediately effective in keeping MAP>60.  Noted UOP increase once BP improved.  Resp. Therapy unable to place A-line despite 3 attempts.  Dr. Oletta Darter notified.  No problems obtaining BP for cuff pressure.  Nephrology MD here to see pt on change of shift, updated on pts day.   Goal: Patient tolerating nututrition at goal Outcome: Progressing Pt. Noted to have high residuals at beginning of shift (4100ml).  Abdomen noted to be distended and firm, Dr. Alva Garnet notified- received order to administer Dulcolax suppository as no recorded BM in past 3 days.  Pt. Intermittently awake and c/o discomfort to abdomen several times throughout shift, tube feedings held for a couple hours due to instability of patient, KUB obtained- see MD note. Goal: Progressing towards optiumm acitivities Outcome: Not Progressing Pt critically unstable at this time. Goal: Patient tolerating weaning plan Outcome: Not Progressing Pt. Became severely agitated at beginning of shift, nearly jumping OOB and self-extubating.  Three staff members present to hold patient in the bed and administer Fentanyl boluses for sedation/safety.  Bilateral soft wrist restraints and  mittens remain intact for safety.

## 2014-05-30 ENCOUNTER — Inpatient Hospital Stay (HOSPITAL_COMMUNITY): Payer: Medicaid Other

## 2014-05-30 DIAGNOSIS — I469 Cardiac arrest, cause unspecified: Secondary | ICD-10-CM

## 2014-05-30 DIAGNOSIS — I340 Nonrheumatic mitral (valve) insufficiency: Secondary | ICD-10-CM

## 2014-05-30 DIAGNOSIS — J9601 Acute respiratory failure with hypoxia: Secondary | ICD-10-CM

## 2014-05-30 DIAGNOSIS — I429 Cardiomyopathy, unspecified: Secondary | ICD-10-CM

## 2014-05-30 LAB — BASIC METABOLIC PANEL
ANION GAP: 10 (ref 5–15)
Anion gap: 9 (ref 5–15)
BUN: 24 mg/dL — ABNORMAL HIGH (ref 6–23)
BUN: 23 mg/dL (ref 6–23)
CO2: 19 mmol/L (ref 19–32)
CO2: 18 mmol/L — ABNORMAL LOW (ref 19–32)
Calcium: 7.7 mg/dL — ABNORMAL LOW (ref 8.4–10.5)
Calcium: 7.5 mg/dL — ABNORMAL LOW (ref 8.4–10.5)
Chloride: 119 mmol/L — ABNORMAL HIGH (ref 96–112)
Chloride: 117 mmol/L — ABNORMAL HIGH (ref 96–112)
Creatinine, Ser: 7.58 mg/dL — ABNORMAL HIGH (ref 0.50–1.35)
Creatinine, Ser: 7.19 mg/dL — ABNORMAL HIGH (ref 0.50–1.35)
GFR calc Af Amer: 10 mL/min — ABNORMAL LOW (ref >90)
GFR calc Af Amer: 11 mL/min — ABNORMAL LOW (ref >90)
GFR calc non Af Amer: 9 mL/min — ABNORMAL LOW (ref >90)
GFR calc non Af Amer: 10 mL/min — ABNORMAL LOW (ref >90)
Glucose, Bld: 167 mg/dL — ABNORMAL HIGH (ref 70–99)
Glucose, Bld: 160 mg/dL — ABNORMAL HIGH (ref 70–99)
POTASSIUM: 4 mmol/L (ref 3.5–5.1)
Potassium: 4.3 mmol/L (ref 3.5–5.1)
SODIUM: 145 mmol/L (ref 135–145)
Sodium: 147 mmol/L — ABNORMAL HIGH (ref 135–145)

## 2014-05-30 LAB — GLUCOSE, CAPILLARY
Glucose-Capillary: 107 mg/dL — ABNORMAL HIGH (ref 70–99)
Glucose-Capillary: 142 mg/dL — ABNORMAL HIGH (ref 70–99)
Glucose-Capillary: 155 mg/dL — ABNORMAL HIGH (ref 70–99)
Glucose-Capillary: 160 mg/dL — ABNORMAL HIGH (ref 70–99)
Glucose-Capillary: 161 mg/dL — ABNORMAL HIGH (ref 70–99)
Glucose-Capillary: 186 mg/dL — ABNORMAL HIGH (ref 70–99)

## 2014-05-30 MED ORDER — ALTEPLASE 2 MG IJ SOLR
2.0000 mg | Freq: Once | INTRAMUSCULAR | Status: AC
  Administered 2014-05-30: 2 mg
  Filled 2014-05-30: qty 2

## 2014-05-30 MED ORDER — SODIUM CHLORIDE 0.9 % IJ SOLN
10.0000 mL | INTRAMUSCULAR | Status: DC | PRN
  Administered 2014-05-30 – 2014-05-31 (×2): 20 mL
  Filled 2014-05-30: qty 40

## 2014-05-30 MED ORDER — LORAZEPAM 2 MG/ML IJ SOLN
INTRAMUSCULAR | Status: AC
Start: 2014-05-30 — End: 2014-05-30
  Administered 2014-05-30: 2 mg via INTRAVENOUS
  Filled 2014-05-30: qty 1

## 2014-05-30 MED ORDER — IPRATROPIUM-ALBUTEROL 0.5-2.5 (3) MG/3ML IN SOLN
3.0000 mL | Freq: Four times a day (QID) | RESPIRATORY_TRACT | Status: DC
  Administered 2014-05-30 – 2014-06-10 (×46): 3 mL via RESPIRATORY_TRACT
  Filled 2014-05-30 (×46): qty 3

## 2014-05-30 MED ORDER — BUDESONIDE 0.25 MG/2ML IN SUSP
0.2500 mg | Freq: Four times a day (QID) | RESPIRATORY_TRACT | Status: DC
  Administered 2014-05-30 – 2014-06-05 (×24): 0.25 mg via RESPIRATORY_TRACT
  Filled 2014-05-30 (×24): qty 2

## 2014-05-30 MED ORDER — LORAZEPAM 2 MG/ML IJ SOLN
2.0000 mg | Freq: Once | INTRAMUSCULAR | Status: AC
  Administered 2014-05-30: 2 mg via INTRAVENOUS

## 2014-05-30 NOTE — Progress Notes (Signed)
Late entry- oral care given to patient, pt became agitated trying to sit up in bed already on 1.0 Precedex and 400 mcg Fentanyl.  HR slowed down to 40, unable to feel pulse. Code Blue called at 1020.  See code sheet

## 2014-05-30 NOTE — Progress Notes (Signed)
  Echocardiogram 2D Echocardiogram has been performed.  [name], [name] A 05/30/2014, 8:14 AM

## 2014-05-30 NOTE — Progress Notes (Signed)
Subjective: Interval History: levo gtt down 4 ug/hr, requiring lots of sedation for agitation. UOP 790 cc yesterday  Objective: Vital signs in last 24 hours: Temp:  [95.4 F (35.2 C)-98.4 F (36.9 C)] 96.8 F (36 C) (04/24 0700) Pulse Rate:  [64-133] 75 (04/24 0337) Resp:  [0-28] 20 (04/24 0700) BP: (48-174)/(25-144) 100/69 mmHg (04/24 0700) SpO2:  [78 %-100 %] 99 % (04/24 0700) FiO2 (%):  [30 %] 30 % (04/24 0400) Weight change:   Intake/Output from previous day: 04/23 0701 - 04/24 0700 In: 2717.3 [I.V.:1006.8; NG/GT:460.5; IV Piggyback:1050] Out: 790 [Urine:790] Intake/Output this shift:    General appearance: sedated on vent, not responsive.  small extrem, for torso Resp: rales bibasilar, rhonchi bilaterally and decreased bs Cardio: S1, S2 normal GI: very few bs, mod distension, PEG mid abdm Extremities: 1+  Lab Results:  Recent Labs  05/28/14 0355 05/29/14 0445  WBC 11.0* 9.5  HGB 10.4* 12.2*  HCT 34.6* 39.9  PLT 228 232   BMET:   Recent Labs  05/29/14 0445 05/30/14 0603  NA 147* 147*  K 4.2 4.3  CL 119* 119*  CO2 21 19  GLUCOSE 209* 167*  BUN 21 24*  CREATININE 7.06* 7.58*  CALCIUM 8.2* 7.7*   No results for input(s): PTH in the last 72 hours. Iron Studies: No results for input(s): IRON, TIBC, TRANSFERRIN, FERRITIN in the last 72 hours.  CXR 4/24 layering effusions I have reviewed the patient's current medications.  Assessment/Plan: 1  AKI contrast.  Nonoliguric.  Vol xs. Acid/base ok. CO2 retention. Cr up but not as much  2  VDRF restrictive lung dz, CO2 retention per CCM, effusions 3 Klippel Feil 4 Tourettes 5 Ileus 6 ^Na P Cr stabilizing, ^MAP goal/ renal perfusion, ^D5, labs 4pm   LOS: 7 days   [name] D 05/30/2014,7:42 AM

## 2014-05-30 NOTE — Progress Notes (Signed)
PULMONARY / CRITICAL CARE MEDICINE   Name: Aaron Aaron MRN: [id number] DOB: [birth year]    ADMISSION DATE:  05-28-14 CONSULTATION DATE:  05/30/2014  REFERRING MD :  Aaron Aaron  CHIEF COMPLAINT:  resp distress  INITIAL PRESENTATION: 24 y.o with long admission in 04/2014 for ARDS, tstomy decannulated 04/27/14, PEG in situ re admitted 4/17 for altered mental status, which improved. Intent was to remove PEG on 4/22. He developed progressive AKI. PCCM consulted for acute resp acidosis on 4/20 after swallow study ? Aspiration. PMH of club foot, hemiatrophy of RLE, Klippel Feil Syndrome, scoliosis s/p harrington rod surgery (2003), Restrictive lung physiology, Tourette's syndrome He is very functional & goes to [facility], lives with mom  STUDIES:  4/18 CT chest/ abd/pelvis:  BLL atx/ pna 4/24 Echocardiogram (performed prior to bradycardic arrest): Moderate LVH, LVEF 30-35%, decreased from LVEF 50-55% 04/05/14  SIGNIFICANT EVENTS: 4/20 swallow eval >> failed 4/20 intubated for resp acidosis 4/23 Failed self extubation due to rapid and severe hypoxemia 4/24 bradycardic arrest triggered by severe agitation during mouth care with approx 5 mins chest compressions and 2 amps epi.   SUBJECTIVE:  RASS -4. Not F/C  VITAL SIGNS: Temp:  [96.1 F (35.6 C)-98.4 F (36.9 C)] 96.8 F (36 C) (04/24 1852) Pulse Rate:  [75-90] 75 (04/24 0337) Resp:  [8-20] 12 (04/24 1852) BP: (70-156)/(31-99) 113/60 mmHg (04/24 1200) SpO2:  [78 %-100 %] 83 % (04/24 1845) FiO2 (%):  [30 %] 30 % (04/24 1632) HEMODYNAMICS: CVP:  [19 mmHg-30 mmHg] 27 mmHg VENTILATOR SETTINGS: Vent Mode:  [-] PRVC FiO2 (%):  [30 %] 30 % Set Rate:  [12 bmp-20 bmp] 12 bmp Vt Set:  [380 mL-450 mL] 450 mL PEEP:  [5 cmH20] 5 cmH20 Plateau Pressure:  [31 cmH20-37 cmH20] 37 cmH20 INTAKE / OUTPUT:  Intake/Output Summary (Last 24 hours) at 05/30/14 2102 Last data filed at 05/30/14 1900  Gross per 24 hour  Intake 4164.08 ml  Output    660 ml  Net  3504.08 ml    PHYSICAL EXAMINATION: General:  Acutely ill,intubated. No distress.  Neuro:  Non focal. No follow commands HEENT:  Class 3 airway, short neck, no jvd, orally intubated  Cardiovascular:  s1s2 tachy Lungs: very prolonged expiratory phase with distant wheezes Abdomen:  Soft, non tender, PEG site clean.  Musculoskeletal:  No edema  LABS:  CBC  Recent Labs Lab 05/25/14 1008 05/28/14 0355 05/29/14 0445  WBC 6.8 11.0* 9.5  HGB 14.0 10.4* 12.2*  HCT 46.1 34.6* 39.9  PLT 297 228 232   Coag's No results for input(s): APTT, INR in the last 168 hours. BMET  Recent Labs Lab 05/29/14 0445 05/30/14 0603 05/30/14 1730  NA 147* 147* 145  K 4.2 4.3 4.0  CL 119* 119* 117*  CO2 21 19 18*  BUN 21 24* 23  CREATININE 7.06* 7.58* 7.19*  GLUCOSE 209* 167* 160*   Electrolytes  Recent Labs Lab 05/29/14 0445 05/30/14 0603 05/30/14 1730  CALCIUM 8.2* 7.7* 7.5*  PHOS 5.3*  --   --    Sepsis Markers No results for input(s): LATICACIDVEN, PROCALCITON, O2SATVEN in the last 168 hours. ABG  Recent Labs Lab 05/27/14 0857 05/27/14 1130 05/28/14 0328  PHART 7.178* 7.260* 7.317*  PCO2ART 61.0* 49.1* 38.6  PO2ART 82.2 81.5 149.0*   Liver Enzymes  Recent Labs Lab 05/24/14 0518 05/29/14 0445  AST 27 20  ALT 18 22  ALKPHOS 65 61  BILITOT 0.8 0.4  ALBUMIN 4.2 2.8*   Cardiac Enzymes No  results for input(s): TROPONINI, PROBNP in the last 168 hours. 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 Prior trach tube Severe airflow obstruction 4/24 P:   Cont full vent support - settings reviewed and/or adjusted Cont vent bundle Daily SBT if/when meets criteria  Will likely need repeat  trach tube [facility](Wolicki placed previously) Nebulized steroids and BDs 4/24  CARDIOVASCULAR R IJ CVL 4/20 >>  R femoral A-line 4/24 >>  A:  Very labile BPs Persistent hypotension 4/23 S/p bradycardic arrest 4/24 New finding of cardiomyopathy by Echo 4/24 P:  Holding clonidine MAP goal > 60 mmHg Cont norepi Consider Cards eval for new CM  RENAL A:   AKI, nonoliguric  No idication for HD presently Metabolic acidosis, mild Hypernatremia P:   Renal service following Monitor BMET intermittently Monitor I/Os Correct electrolytes as indicated D5W initiated 4/24  GASTROINTESTINAL A:   Chronic G tube Elevated gastric residuals Constipation P:   SUP: enteral PPI Cont TFs PRN dulcolax and miralax 4/23  HEMATOLOGIC A:   No issues P:  DVT px: SQ heparin Monitor CBC intermittently Transfuse per usual ICU guidelines  INFECTIOUS A:   Suspected PNA  CXR has cleared P:   DC abx and follow  ENDOCRINE A:   Hyperglycemia without prior dx of DM P:   Cont SSI initiated 4/23  NEUROLOGIC A:   Klippel Feil Syndrome ADD P:   RASS goal: -1, -2 Cont dex and fent gtts Holding adderall  FAMILY  - Updates:   - Inter-disciplinary family meet or Palliative Care meeting due by:  4/27  CCM time: 50 mins  Billy Fischeravid Vinayak Bobier, MD ; [facility] Mobile [phone].  After 5:30 PM or weekends, call [phone]

## 2014-05-30 NOTE — Progress Notes (Signed)
eLink Physician-Brief Progress Note Patient Name: [name] DOB: 01/17/1991 MRN: [id number]   Date of Service  05/30/2014  HPI/Events of Note  Continued agitation despite max precedex and fentanyl.  eICU Interventions  Plan: One time dose of ativan 2 mg IV  Continue to monitor patient     Intervention Category Major Interventions: Delirium, psychosis, severe agitation - evaluation and management  DETERDING,ELIZABETH 05/30/2014, 1:38 AM

## 2014-05-30 NOTE — Care Management (Signed)
CARE MANAGEMENT NOTE 05/30/2014  Patient:  [name],[name]   Account Number:  [id number]  Date Initiated:  05/24/2014  Documentation initiated by:  Trinna BalloonMcGIBBONEY,COOKIE MYRAETTE  Subjective/Objective Assessment:   Pt admitted with cco sepsis     Action/Plan:   from home with mother   Anticipated DC Date:  December 25, 2014   Anticipated DC Plan:  HOME W HOME HEALTH SERVICES      DC Planning Services  CM consult      Choice offered to / List presented to:             Status of service:  In process, will continue to follow Medicare Important Message given?   (If response is "NO", the following Medicare IM given date fields will be blank) Date Medicare IM given:   Medicare IM given by:   Date Additional Medicare IM given:   Additional Medicare IM given by:    Discharge Disposition:    Per UR Regulation:  Reviewed for med. necessity/level of care/duration of stay  If discussed at Long Length of Stay Meetings, dates discussed:    Comments:  May 30, 2014/Jacen Carlini L. Earlene Plateravis, RN, BSN, CCM. Case Management [place] [phone] No discharge needs present of time of review. iv fentynl drip and intubated  May 27, 2014/Alistar Mcenery L. Earlene Plateravis, RN, BSN, CCM. Case Management [facility] [phone] No discharge needs present of time of review. Respfailure with aki transferred to icu on [phone] and required intubation.  05/13/2014 MMcGibboney, RN, BSN Chart reviewed.

## 2014-05-30 NOTE — Progress Notes (Signed)
eLink Physician-Brief Progress Note Patient Name: Cordarryl [name] DOB: 10/16/1990 MRN: [id number]   Date of Service  05/30/2014  HPI/Events of Note  Able to flush proximal and distal cvp ports, however, unable to draw from these ports.   eICU Interventions  Will order Cathflo for proximal and distal cvp ports.      Intervention Category Minor Interventions: Routine modifications to care plan (e.g. PRN medications for pain, fever)  Lucio Litsey Eugene 05/30/2014, 9:45 PM

## 2014-05-31 ENCOUNTER — Inpatient Hospital Stay (HOSPITAL_COMMUNITY): Payer: Medicaid Other

## 2014-05-31 DIAGNOSIS — R001 Bradycardia, unspecified: Secondary | ICD-10-CM

## 2014-05-31 LAB — CBC
HEMATOCRIT: 38.8 % — AB (ref 39.0–52.0)
Hemoglobin: 11.6 g/dL — ABNORMAL LOW (ref 13.0–17.0)
MCH: 29.9 pg (ref 26.0–34.0)
MCHC: 29.9 g/dL — ABNORMAL LOW (ref 30.0–36.0)
MCV: 100 fL (ref 78.0–100.0)
Platelets: 223 10*3/uL (ref 150–400)
RBC: 3.88 MIL/uL — ABNORMAL LOW (ref 4.22–5.81)
RDW: 14.5 % (ref 11.5–15.5)
WBC: 7.2 10*3/uL (ref 4.0–10.5)

## 2014-05-31 LAB — TROPONIN I: Troponin I: 0.18 ng/mL — ABNORMAL HIGH (ref <0.031)

## 2014-05-31 LAB — BLOOD GAS, ARTERIAL
ACID-BASE DEFICIT: 12 mmol/L — AB (ref 0.0–2.0)
BICARBONATE: 17 meq/L — AB (ref 20.0–24.0)
DRAWN BY: 295031
FIO2: 0.3 %
LHR: 12 {breaths}/min
MECHVT: 450 mL
O2 Saturation: 98.3 %
PATIENT TEMPERATURE: 98.6
PEEP: 5 cmH2O
PO2 ART: 130 mmHg — AB (ref 80.0–100.0)
TCO2: 16.6 mmol/L (ref 0–100)
pCO2 arterial: 53.9 mmHg — ABNORMAL HIGH (ref 35.0–45.0)
pH, Arterial: 7.126 — CL (ref 7.350–7.450)

## 2014-05-31 LAB — BASIC METABOLIC PANEL
Anion gap: 8 (ref 5–15)
BUN: 23 mg/dL (ref 6–23)
CHLORIDE: 112 mmol/L (ref 96–112)
CO2: 15 mmol/L — ABNORMAL LOW (ref 19–32)
CREATININE: 7.43 mg/dL — AB (ref 0.50–1.35)
Calcium: 7 mg/dL — ABNORMAL LOW (ref 8.4–10.5)
GFR calc Af Amer: 11 mL/min — ABNORMAL LOW (ref >90)
GFR calc non Af Amer: 9 mL/min — ABNORMAL LOW (ref >90)
Glucose, Bld: 148 mg/dL — ABNORMAL HIGH (ref 70–99)
POTASSIUM: 3.7 mmol/L (ref 3.5–5.1)
Sodium: 135 mmol/L (ref 135–145)

## 2014-05-31 LAB — GLUCOSE, CAPILLARY
GLUCOSE-CAPILLARY: 184 mg/dL — AB (ref 70–99)
GLUCOSE-CAPILLARY: 143 mg/dL — AB (ref 70–99)
GLUCOSE-CAPILLARY: 152 mg/dL — AB (ref 70–99)
Glucose-Capillary: 167 mg/dL — ABNORMAL HIGH (ref 70–99)
Glucose-Capillary: 201 mg/dL — ABNORMAL HIGH (ref 70–99)

## 2014-05-31 LAB — CARBOXYHEMOGLOBIN
Carboxyhemoglobin: 0.9 % (ref 0.5–1.5)
METHEMOGLOBIN: 0.7 % (ref 0.0–1.5)
O2 SAT: 89.2 %
TOTAL HEMOGLOBIN: 11 g/dL — AB (ref 13.5–18.0)

## 2014-05-31 LAB — PROCALCITONIN: Procalcitonin: 2 ng/mL

## 2014-05-31 MED ORDER — DEXMEDETOMIDINE HCL IN NACL 400 MCG/100ML IV SOLN
0.0000 ug/kg/h | INTRAVENOUS | Status: DC
  Administered 2014-05-31 (×3): 1.4 ug/kg/h via INTRAVENOUS
  Administered 2014-05-31 – 2014-06-01 (×2): 1.6 ug/kg/h via INTRAVENOUS
  Administered 2014-06-01: 1.5 ug/kg/h via INTRAVENOUS
  Administered 2014-06-01: 1.6 ug/kg/h via INTRAVENOUS
  Filled 2014-05-31 (×7): qty 100

## 2014-05-31 MED ORDER — MIDAZOLAM HCL 2 MG/2ML IJ SOLN
2.0000 mg | INTRAMUSCULAR | Status: DC | PRN
  Administered 2014-05-31 – 2014-06-01 (×5): 4 mg via INTRAVENOUS
  Administered 2014-06-01 – 2014-06-03 (×6): 2 mg via INTRAVENOUS
  Administered 2014-06-04 (×7): 4 mg via INTRAVENOUS
  Administered 2014-06-04: 2 mg via INTRAVENOUS
  Administered 2014-06-04 – 2014-06-05 (×7): 4 mg via INTRAVENOUS
  Administered 2014-06-05: 2 mg via INTRAVENOUS
  Administered 2014-06-05 – 2014-06-06 (×3): 4 mg via INTRAVENOUS
  Administered 2014-06-06: 2 mg via INTRAVENOUS
  Filled 2014-05-31 (×2): qty 4
  Filled 2014-05-31: qty 2
  Filled 2014-05-31 (×6): qty 4
  Filled 2014-05-31: qty 2
  Filled 2014-05-31 (×3): qty 4
  Filled 2014-05-31: qty 2
  Filled 2014-05-31 (×14): qty 4

## 2014-05-31 MED ORDER — MIDAZOLAM HCL 2 MG/2ML IJ SOLN
2.0000 mg | INTRAMUSCULAR | Status: DC | PRN
  Administered 2014-05-31 (×2): 2 mg via INTRAVENOUS
  Administered 2014-05-31: 4 mg via INTRAVENOUS
  Administered 2014-05-31: 2 mg via INTRAVENOUS
  Administered 2014-05-31: 4 mg via INTRAVENOUS
  Administered 2014-05-31 (×4): 2 mg via INTRAVENOUS
  Filled 2014-05-31 (×3): qty 2
  Filled 2014-05-31: qty 4
  Filled 2014-05-31 (×2): qty 2
  Filled 2014-05-31: qty 4
  Filled 2014-05-31 (×2): qty 2

## 2014-05-31 MED ORDER — MIDAZOLAM HCL 2 MG/2ML IJ SOLN
INTRAMUSCULAR | Status: AC
  Filled 2014-05-31: qty 4

## 2014-05-31 MED ORDER — VALPROATE SODIUM 500 MG/5ML IV SOLN
1000.0000 mg | Freq: Two times a day (BID) | INTRAVENOUS | Status: DC
  Administered 2014-05-31 – 2014-06-10 (×21): 1000 mg via INTRAVENOUS
  Filled 2014-05-31 (×22): qty 10

## 2014-05-31 MED ORDER — SODIUM BICARBONATE 8.4 % IV SOLN
INTRAVENOUS | Status: DC
  Administered 2014-05-31 – 2014-06-01 (×3): via INTRAVENOUS
  Filled 2014-05-31 (×5): qty 100

## 2014-05-31 NOTE — Consult Note (Signed)
Reason for Consult:   New cardiomyopathy  Requesting Physician: CCM Primary Cardiologist New  HPI:  24 y.o AA male with a  PMH of club foot, hemiatrophy of RLE, Klippel Feil Syndrome, scoliosis s/p harrington rod surgery (2003), Restrictive lung physiology, and Tourette's syndrome. He was very functional & goes to [place], lives with his mother. He was admitted 03/31/14- 04/28/14  with acute respiratory failure secondary to CAP. Hospital course complicated by ARDS, delirium, acute renal injury, prolonged ventilator support, s/p tracheostomy 04/07/14.             He was discharge and re admitted 05-31-2014 with decreasing mental status. There was concern he had sepsis. He developed recurrent respiratory failure and had to be re intubated (trach had already been removed). He has also had worsening renal function and hypotension. Echo done 4/24 am revealed new LVD compared with an echo from Feb. Later on the 24th he was receiving oral care when he became agitated and HR slowed to 40, code blue called. He received 2 amps of Epi and approximately 5 minutes of CPR. He has had no further bradycardia.    PMHx:  Past Medical History  Diagnosis Date  . Tourette's syndrome   . Tic disorder   . Scoliosis   . Hemiatrophy of right leg   . Club foot     Right  . Mental retardation     mild    Past Surgical History  Procedure Laterality Date  . Foot surgery Right 2002  . Eye muscle surgery Bilateral 1998 and 2002  . Femoral hernia repair      Done when he was an infant  . Harrington rod surgery  2003    For Scoliosis  . Tracheostomy tube placement N/A 04/07/2014    Procedure: TRACHEOSTOMY;  Surgeon: Flo Shanks, MD;  Location: [facility] OR;  Service: ENT;  Laterality: N/A;    SOCHx:  reports that he has never smoked. He has never used smokeless tobacco. He reports that he does not drink alcohol or use illicit drugs.  FAMHx: Family History  Problem Relation Age of Onset  . Breast cancer  Maternal Grandmother     Died in her mid 58's  . Prostate cancer Maternal Grandfather     Died in his 10's  . Hypertension      ALLERGIES: No Known Allergies  ROS: Review of systems not obtained due to patient factors.  HOME MEDICATIONS: Prior to Admission medications   Medication Sig Start Date End Date Taking? Authorizing Provider  cloNIDine (CATAPRES) 0.1 MG tablet TAKE 3 TABLETS BY MOUTH AT BEDTIME 04/29/14  Yes Deetta Perla, MD  Dexmethylphenidate HCl 35 MG CP24 Take 1 capsule every morning 05/03/14  Yes Princella Ion, NP    HOSPITAL MEDICATIONS: I have reviewed the patient's current medications.  VITALS: Blood pressure 119/81, pulse 107, temperature 96.8 F (36 C), temperature source Core (Comment), resp. rate 20, height  (1.499 m), weight 160 lb 11.5 oz (72.9 kg), SpO2 100 %.  PHYSICAL EXAM: General appearance: intubated, sedated Neck: thick short neck, no obvious bruit Lungs: decreased breath sounds Heart: regular rate and rhythm and decreased heart sounds, no obvious murmur Abdomen: soft Extremities: no edema Pulses: diminnished Skin: cool and dry Neurologic: Grossly normal  LABS: Results for orders placed or performed during the hospital encounter of 2014-05-31 (from the past 24 hour(s))  Glucose, capillary     Status: Abnormal   Collection Time: 05/30/14  4:35 PM  Result Value Ref Range   Glucose-Capillary 160 (H) 70 - 99 mg/dL   Comment 1 Notify RN    Comment 2 Document in Chart   Basic metabolic panel     Status: Abnormal   Collection Time: 05/30/14  5:30 PM  Result Value Ref Range   Sodium 145 135 - 145 mmol/L   Potassium 4.0 3.5 - 5.1 mmol/L   Chloride 117 (H) 96 - 112 mmol/L   CO2 18 (L) 19 - 32 mmol/L   Glucose, Bld 160 (H) 70 - 99 mg/dL   BUN 23 6 - 23 mg/dL   Creatinine, Ser 1.617.19 (H) 0.50 - 1.35 mg/dL   Calcium 7.5 (L) 8.4 - 10.5 mg/dL   GFR calc non Af Amer 10 (L) >90 mL/min   GFR calc Af Amer 11 (L) >90 mL/min   Anion gap 10  5 - 15  Glucose, capillary     Status: Abnormal   Collection Time: 05/30/14  9:13 PM  Result Value Ref Range   Glucose-Capillary 161 (H) 70 - 99 mg/dL  Glucose, capillary     Status: Abnormal   Collection Time: 05/31/14  1:01 AM  Result Value Ref Range   Glucose-Capillary 167 (H) 70 - 99 mg/dL  Glucose, capillary     Status: Abnormal   Collection Time: 05/31/14  5:04 AM  Result Value Ref Range   Glucose-Capillary 184 (H) 70 - 99 mg/dL  CBC     Status: Abnormal   Collection Time: 05/31/14  5:45 AM  Result Value Ref Range   WBC 7.2 4.0 - 10.5 K/uL   RBC 3.88 (L) 4.22 - 5.81 MIL/uL   Hemoglobin 11.6 (L) 13.0 - 17.0 g/dL   HCT 09.638.8 (L) 04.539.0 - 40.952.0 %   MCV 100.0 78.0 - 100.0 fL   MCH 29.9 26.0 - 34.0 pg   MCHC 29.9 (L) 30.0 - 36.0 g/dL   RDW 81.114.5 91.411.5 - 78.215.5 %   Platelets 223 150 - 400 K/uL  Procalcitonin     Status: None   Collection Time: 05/31/14  5:45 AM  Result Value Ref Range   Procalcitonin 2.00 ng/mL  Carboxyhemoglobin     Status: Abnormal   Collection Time: 05/31/14 10:00 AM  Result Value Ref Range   Total hemoglobin 11.0 (L) 13.5 - 18.0 g/dL   O2 Saturation 95.689.2 %   Carboxyhemoglobin 0.9 0.5 - 1.5 %   Methemoglobin 0.7 0.0 - 1.5 %  Blood gas, arterial     Status: Abnormal   Collection Time: 05/31/14 10:30 AM  Result Value Ref Range   FIO2 0.30 %   Delivery systems VENTILATOR    Mode PRESSURE REGULATED VOLUME CONTROL    VT 450 mL   Rate 12 resp/min   Peep/cpap 5.0 cm H20   pH, Arterial 7.126 (LL) 7.350 - 7.450   pCO2 arterial 53.9 (H) 35.0 - 45.0 mmHg   pO2, Arterial 130.0 (H) 80.0 - 100.0 mmHg   Bicarbonate 17.0 (L) 20.0 - 24.0 mEq/L   TCO2 16.6 0 - 100 mmol/L   Acid-base deficit 12.0 (H) 0.0 - 2.0 mmol/L   O2 Saturation 98.3 %   Patient temperature 98.6    Collection site A-LINE    Drawn by [id number]    Sample type ARTERIAL DRAW   Basic metabolic panel     Status: Abnormal   Collection Time: 05/31/14 11:00 AM  Result Value Ref Range   Sodium 135 135 - 145  mmol/L  Potassium 3.7 3.5 - 5.1 mmol/L   Chloride 112 96 - 112 mmol/L   CO2 15 (L) 19 - 32 mmol/L   Glucose, Bld 148 (H) 70 - 99 mg/dL   BUN 23 6 - 23 mg/dL   Creatinine, Ser 1.61 (H) 0.50 - 1.35 mg/dL   Calcium 7.0 (L) 8.4 - 10.5 mg/dL   GFR calc non Af Amer 9 (L) >90 mL/min   GFR calc Af Amer 11 (L) >90 mL/min   Anion gap 8 5 - 15    EKG: from 4/18- sinus tachycardia and LVH  IMAGING: Dg Chest Port 1 View  05/31/2014   CLINICAL DATA:  Respiratory failure intubated patient, scoliosis.  EXAM: PORTABLE CHEST - 1 VIEW  COMPARISON:  Portable chest x-ray of May 30, 2014  FINDINGS: The lungs are slightly better inflated. There remains increased density in the retrocardiac region on the left. The interstitial markings bilaterally are prominent but stable. The right internal jugular venous catheter tip projects over the proximal SVC. There is severe scoliosis with presence of spinal stabilization hardware which is stable. Deformity of the left chest wall is present.  IMPRESSION: Overall improved aeration of the lungs with persistent atelectasis or pneumonia in the left lower lobe.   Electronically Signed   By: David  [place] M.D.   On: 05/31/2014 07:36   Dg Chest Port 1 View  05/30/2014   CLINICAL DATA:  Respiratory failure  EXAM: PORTABLE CHEST - 1 VIEW  COMPARISON:  05/29/2014  FINDINGS: Right IJ approach central line tip terminates over the upper SVC. Endotracheal tube is appropriately positioned. Thoracic spinal fusion hardware reidentified. Bilateral chest wall deformities are noted. Moderate enlargement of the cardiac silhouette is reidentified with patchy left greater than right lower lobe airspace opacities  IMPRESSION: No significant change.   Electronically Signed   By: Christiana Pellant M.D.   On: 05/30/2014 09:20   Dg Abd Portable 1v  05/29/2014   CLINICAL DATA:  Abdominal distention. Ileus. Patient noncompliant with technologist imaging.  EXAM: PORTABLE ABDOMEN - 1 VIEW  COMPARISON:   None.  FINDINGS: Single frontal view of the abdomen is obtained. Harrington rods are present in the thoracolumbar spine. Foley catheter is present. Paucity of bowel gas is present. No dilated loops of large or small bowel identified. Chronic right-sided rib deformity.  IMPRESSION: No acute abnormality.  Nonobstructive bowel gas pattern.   Electronically Signed   By: Andreas Newport M.D.   On: 05/29/2014 15:39    IMPRESSION: 1. Transient bradycardia in the setting of aspiration 2. 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 Recurrent respiratory failure- intubated 4. 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 HCAP  RECOMMENDATION: MD to see. Not a candidate for an ACE or ARB, low B/P may preclude Hydralazine/ nitrate. Volume management per Renal service. Re check EKG  Time Spent Directly with Patient: 40 minutes  Abelino Derrick [phone] beeper 05/31/2014, 2:36 PM    Patient seen and examined. Agree with assessment and plan.  Mr. [name] is a 24 year old functional gentleman with a history of club foot, hemiatrophy of RLE, Klippel Feil Syndrome, scoliosis s/p harrington rod surgery (2003), restrictive lung physiology, and Tourette's syndrome.  He is status post a prior admission with acute respiratory failure secondary to pneumonia and had a complicated hospital course with ARDS, delirium, acute kidney injury, and he required prolonged ventilatory support and underwent tracheostomy.  Following discharge, he was readmitted on 06/10/2014 with worsening  mental status and  respiratory failure requiring reintubation.  He has developed worsening renal function and hypotension.  Yesterday at 10:19 AM while being suctioned and getting mouth care he developed significant bradycardia with heart rates dropping to 39 bpm, with apparrent sinus bradycardia.  Due to posible possible PEA  transient CPR was administered.  Heart rate ultimately improved.   He has not had any further significant bradycardia arrhythmia since.  Question if initial episode mediated by a vagal response to suctioning and mouth care.  An echo Doppler study done yesterday showed moderate left ventricular hypertrophy with moderately severe new LV dysfunction with an ejection fraction of 30-35% with diffuse hypokinesis.  Presently, the patient is intubated and sedated on the ventilator.  His ECG shows sinus rhythm at 82 bpm.  He has diffuse T-wave abnormalities in leads 1 and L, V2 through V6, and QT interval is prolonged at 509 ms.  His current blood pressure is in the 120s systolically.  He has stage V acute renal disease with a creatinine of 7.43 and an estimated GFR of 11.  He has been started on a bicarbonate drip due to worsening metabolic acidosis.  Will follow-up serum troponins and serial ECG.  The patient is not a candidate for Ace inhibition or ARB therapy and depending upon ultimate stabilization, may benefit from low-dose nitrates less hydralazine following extubation if blood pressure stabilizes.   Lennette Bihari, MD, [facility] 05/31/2014 3:38 PM

## 2014-05-31 NOTE — Progress Notes (Signed)
Patient ID: [name], male   DOB: 12/26/1990, 24 y.o.   MRN: [id number]  [facility] Progress Note   Assessment/ Plan:   1. AKI: Data/timeline of events is suggestive of contrast-induced nephropathy versus ischemic ATN. Creatinine appears to have reached a plateau between 7.1-7.5-he remains nonoliguric. Worsening metabolic acidosis noted-Will discuss with critical care the utility of starting is slightly hypotonic bicarbonate drip (100 mEq/L). 2. Ventilatory dependent respiratory failure/structural lung disease: With CO2 retention and adjustments made to ventilator by critical care. 3. Ileus: Clinically appears to be better-ongoing tube feeds without significant residuals 4. Mixed gap/non-gap Metabolic  acidosis: Likely associated with acute renal failure 5. Hypernatremia: Significantly improved with free water/D5W-continue to monitor to ensure he doesn't tip over into hyponatremia  Subjective:   No acute events overnight, remains agitated intermittently and shifts around in bed significantly. Off Levophed    Objective:   BP 119/81 mmHg  Pulse 72  Temp(Src) 96.4 F (35.8 C) (Core (Comment))  Resp 20  Ht 4\' 11"  (1.499 m)  Wt 72.9 kg (160 lb 11.5 oz)  BMI 32.44 kg/m2  SpO2 99%  Intake/Output Summary (Last 24 hours) at 05/31/14 1224 Last data filed at 05/31/14 1100  Gross per 24 hour  Intake 7054.03 ml  Output    765 ml  Net 6289.03 ml   Weight change:   Physical Exam: Gen: Appears to be sedated/intubated and comfortable Resp: Coarse breath sounds bilaterally-no distinct rales CVS: Pulse regular in rate and rhythm, S1 and S2 normal Abd: Soft, flat, nontender and bowel sounds normal Ext: 1+ left lower extremity edema-no right lower extremity edema (hemiatrophy noted)  Imaging: Dg Chest Port 1 View  05/31/2014   CLINICAL DATA:  Respiratory failure intubated patient, scoliosis.  EXAM: PORTABLE CHEST - 1 VIEW  COMPARISON:  Portable chest x-ray of May 30, 2014   FINDINGS: The lungs are slightly better inflated. There remains increased density in the retrocardiac region on the left. The interstitial markings bilaterally are prominent but stable. The right internal jugular venous catheter tip projects over the proximal SVC. There is severe scoliosis with presence of spinal stabilization hardware which is stable. Deformity of the left chest wall is present.  IMPRESSION: Overall improved aeration of the lungs with persistent atelectasis or pneumonia in the left lower lobe.   Electronically Signed   By: David  [place]Jordan M.D.   On: 05/31/2014 07:36   Dg Chest Port 1 View  05/30/2014   CLINICAL DATA:  Respiratory failure  EXAM: PORTABLE CHEST - 1 VIEW  COMPARISON:  05/29/2014  FINDINGS: Right IJ approach central line tip terminates over the upper SVC. Endotracheal tube is appropriately positioned. Thoracic spinal fusion hardware reidentified. Bilateral chest wall deformities are noted. Moderate enlargement of the cardiac silhouette is reidentified with patchy left greater than right lower lobe airspace opacities  IMPRESSION: No significant change.   Electronically Signed   By: Christiana PellantGretchen  Green M.D.   On: 05/30/2014 09:20   Dg Abd Portable 1v  05/29/2014   CLINICAL DATA:  Abdominal distention. Ileus. Patient noncompliant with technologist imaging.  EXAM: PORTABLE ABDOMEN - 1 VIEW  COMPARISON:  None.  FINDINGS: Single frontal view of the abdomen is obtained. Harrington rods are present in the thoracolumbar spine. Foley catheter is present. Paucity of bowel gas is present. No dilated loops of large or small bowel identified. Chronic right-sided rib deformity.  IMPRESSION: No acute abnormality.  Nonobstructive bowel gas pattern.   Electronically Signed   By: Charolette ChildGeoffrey  Lamke M.D.  On: 05/29/2014 15:39    Labs: BMET  Recent Labs Lab 05/26/14 0455 05/27/14 0414 05/28/14 1030 05/29/14 0445 05/30/14 0603 05/30/14 1730 05/31/14 1100  NA 142 147* 146* 147* 147* 145 135  K  5.1 4.5 3.8 4.2 4.3 4.0 3.7  CL 110 115* 119* 119* 119* 117* 112  CO2 18* 15*  GLUCOSE 119* 71 72 209* 167* 160* 148*  BUN 24* 23 23  CREATININE 3.86* 5.35* 6.72* 7.06* 7.58* 7.19* 7.43*  CALCIUM 8.3* 7.9* 7.5* 8.2* 7.7* 7.5* 7.0*  PHOS  --   --   --  5.3*  --   --   --    CBC  Recent Labs Lab 05/25/14 1008 05/28/14 0355 05/29/14 0445 05/31/14 0545  WBC 6.8 11.0* 9.5 7.2  HGB 14.0 10.4* 12.2* 11.6*  HCT 46.1 34.6* 39.9 38.8*  MCV 97.3 98.3 96.4 100.0  PLT 297 228 232 223    Medications:    . antiseptic oral rinse  7 mL Mouth Rinse QID  . budesonide  0.25 mg Nebulization 4 times per day  . chlorhexidine  15 mL Mouth Rinse BID  . feeding supplement (VITAL AF 1.2 CAL)  1,000 mL Per Tube Q24H  . free water  200 mL Per Tube Q6H  . heparin subcutaneous  5,000 Units Subcutaneous 3 times per day  . insulin aspart  0-9 Units Subcutaneous 6 times per day  . ipratropium-albuterol  3 mL Nebulization Q6H  . pantoprazole sodium  40 mg Per Tube Q1200  . sodium chloride  3 mL Intravenous Q12H  . valproate sodium  1,000 mg Intravenous Q12H   Zetta Bills, MD 05/31/2014, 12:24 PM

## 2014-05-31 NOTE — Plan of Care (Addendum)
Problem: Phase I Progression Outcomes Goal: Patient tolerating nututrition at goal Outcome: Progressing TF at goal of 60 ml/hr, but pt with residuals up to 450ml. Abdomen remains tight and distended. LBM 4/25.  Goal: Patient tolerating weaning plan Outcome: Not Progressing Pt has extreme intermittent agitation/restlessness requiring high doses of continuous sedation and frequent PRN sedation medication. Pt also has increased muscle tension at rest and was noted to be clenching ETT w/ peak vent pressures up to 65 during the night despite RASS -3 at the time. PRN Versed given; muscle tension decreased and peak pressures decreased to 40s.   Starla Linkarolyn J Seren Chaloux, RN

## 2014-05-31 NOTE — Progress Notes (Signed)
eLink Physician-Brief Progress Note Patient Name: Phinneas [name] DOB: 01/01/1991 MRN: [id number]   Date of Service  05/31/2014  HPI/Events of Note  Agitation.  eICU Interventions  Increase Versed IV dose to Q 1 hour PRN.     Intervention Category Major Interventions: Delirium, psychosis, severe agitation - evaluation and management  Charrise Lardner Eugene 05/31/2014, 10:14 PM

## 2014-05-31 NOTE — Progress Notes (Signed)
MEDICATION RELATED CONSULT NOTE - INITIAL   Pharmacy Consult for Depakote Indication: agitation  No Known Allergies  Patient Measurements: Height: 4\' 11"  (149.9 cm) Weight: 160 lb 11.5 oz (72.9 kg) IBW/kg (Calculated) : 47.7  Vital Signs: Temp: 97 F (36.1 C) (04/25 0900) BP: 119/81 mmHg (04/25 0800) Pulse Rate: 84 (04/25 0900) Intake/Output from previous day: 04/24 0701 - 04/25 0700 In: 6511.9 [I.V.:5014.9; NG/GT:1407] Out: 725 [Urine:725] Intake/Output from this shift: Total I/O In: 459 [I.V.:369; NG/GT:90] Out: 130 [Urine:130]  Labs:  Recent Labs  05/29/14 0445 05/30/14 0603 05/30/14 1730 05/31/14 0545  WBC 9.5  --   --  7.2  HGB 12.2*  --   --  11.6*  HCT 39.9  --   --  38.8*  PLT 232  --   --  223  CREATININE 7.06* 7.58* 7.19*  --   PHOS 5.3*  --   --   --   ALBUMIN 2.8*  --   --   --   PROT 5.7*  --   --   --   AST 20  --   --   --   ALT 22  --   --   --   ALKPHOS 61  --   --   --   BILITOT 0.4  --   --   --    Estimated Creatinine Clearance: 13.1 mL/min (by C-G formula based on Cr of 7.19).  Medical History: Past Medical History  Diagnosis Date  . Tourette's syndrome   . Tic disorder   . Scoliosis   . Hemiatrophy of right leg   . Club foot     Right  . Mental retardation     mild    Medications:  Scheduled:  . antiseptic oral rinse  7 mL Mouth Rinse QID  . budesonide  0.25 mg Nebulization 4 times per day  . chlorhexidine  15 mL Mouth Rinse BID  . feeding supplement (VITAL AF 1.2 CAL)  1,000 mL Per Tube Q24H  . free water  200 mL Per Tube Q6H  . heparin subcutaneous  5,000 Units Subcutaneous 3 times per day  . insulin aspart  0-9 Units Subcutaneous 6 times per day  . ipratropium-albuterol  3 mL Nebulization Q6H  . pantoprazole sodium  40 mg Per Tube Q1200  . sodium chloride  3 mL Intravenous Q12H  . valproate sodium  1,000 mg Intravenous Q12H   Infusions:  . dextrose 125 mL/hr at 05/31/14 0910  . fentaNYL infusion INTRAVENOUS 400  mcg/hr (05/31/14 0900)  . norepinephrine (LEVOPHED) Adult infusion 3 mcg/min (05/31/14 0900)   PRN: Place/Maintain arterial line **AND** sodium chloride, acetaminophen **OR** [DISCONTINUED] acetaminophen, bisacodyl, fentaNYL (SUBLIMAZE) injection, midazolam, [DISCONTINUED] ondansetron **OR** ondansetron (ZOFRAN) IV, polyethylene glycol, sodium chloride  Assessment: 23 y/oM with PMH of Tourette's syndrome, tic disorder, Klippel Feil Syndrome, ADD, and scoliosis known to pharmacy this admission for abx dosing.   Has been difficult to sedate on the vent. Currently on fentanyl and Precedex infusions. RASS 3 this am(goal -1). Avoiding antipsychotics d/t suspected EPS reaction to Haldol or Seroquel in the past.  Goal of Therapy:  Control of agitation  Plan:  Start Depakote 1g IV q12h (~27mg /kg/day)  Charolotte Ekeom Teale Goodgame, PharmD, pager [phone]. 05/31/2014,10:52 AM.

## 2014-05-31 NOTE — Progress Notes (Signed)
Pt w/ extreme agitation requiring 4 staff members to hold patient down while turning pt to clean BM despite Fentanyl gtt @ 400 mcg/hr, Precedex gtt @ 1.1 mcg/hr, and 100 mcg Fentanyl bolus. [facility] called, orders received for 2-4 mg Versed PRN and Precedex gtt ceiling raised to 1.7 mcg/hr per MD Sood.  Starla Linkarolyn J Oleda Borski, RN

## 2014-05-31 NOTE — Progress Notes (Signed)
[facility]  ABG this morning: pH 7.126 Vent RR increased to 20, Maintain TVol 450cc, watch for airtrapping Will likely need CVVHD, will discuss with Renal I called ENT today for re-do tracheostomy, could not reach mother to discuss, left message  Heber CarolinaBrent Kayvan Hoefling, MD [facility] Pager: [phone] Cell: [phone] If no response, call [phone]

## 2014-05-31 NOTE — Progress Notes (Signed)
PULMONARY / CRITICAL CARE MEDICINE   Name: Aaron Aaron MRN: [id number] DOB: 01/31/1991    ADMISSION DATE:  10-21-2014 CONSULTATION DATE:  05/31/2014  REFERRING MD :  Aaron Aaron  CHIEF COMPLAINT:  resp distress  INITIAL PRESENTATION: 24 y.o with long admission in 04/2014 for ARDS, tstomy decannulated 04/27/14, PEG in situ re admitted 4/17 for altered mental status, which improved. Intent was to remove PEG on 4/22. He developed progressive AKI. PCCM consulted for acute resp acidosis on 4/20 after swallow study ? Aspiration. PMH of club foot, hemiatrophy of RLE, Klippel Feil Syndrome, scoliosis s/p harrington rod surgery (2003), Restrictive lung physiology, Tourette's syndrome He is very functional & [facility], lives with mom  STUDIES:  4/18 CT chest/ abd/pelvis:  BLL atx/ pna 4/24 Echocardiogram (performed prior to bradycardic arrest): Moderate LVH, LVEF 30-35%, decreased from LVEF 50-55% 04/05/14  SIGNIFICANT EVENTS: 4/20 swallow eval >> failed 4/20 intubated for resp acidosis 4/23 Failed self extubation due to rapid and severe hypoxemia 4/24 bradycardic arrest triggered by severe agitation during mouth care with approx 5 mins chest compressions and 2 amps epi  SUBJECTIVE:  Extreme agitation again overnight and this morning, remains heavily sedated  VITAL SIGNS: Temp:  [96.1 F (35.6 C)-97.2 F (36.2 C)] 97 F (36.1 C) (04/25 0900) Pulse Rate:  [71-145] 84 (04/25 0900) Resp:  [8-20] 17 (04/25 0900) BP: (103-120)/(50-81) 119/81 mmHg (04/25 0800) SpO2:  [77 %-100 %] 100 % (04/25 0900) FiO2 (%):  [30 %] 30 % (04/25 0848) Weight:  [72.9 kg (160 lb 11.5 oz)] 72.9 kg (160 lb 11.5 oz) (04/25 0630) HEMODYNAMICS: CVP:  [23 mmHg-30 mmHg] 29 mmHg VENTILATOR SETTINGS: Vent Mode:  [-] PRVC FiO2 (%):  [30 %] 30 % Set Rate:  [12 bmp] 12 bmp Vt Set:  [450 mL] 450 mL PEEP:  [5 cmH20] 5 cmH20 Plateau Pressure:  [35 cmH20-42 cmH20] 35 cmH20 INTAKE / OUTPUT:  Intake/Output Summary (Last 24  hours) at 05/31/14 0936 Last data filed at 05/31/14 0900  Gross per 24 hour  Intake 6970.9 ml  Output    790 ml  Net 6180.9 ml    PHYSICAL EXAMINATION: Gen: sedated on vent HENT: NCAT, ETT in place, short neck, trach scar well healed PULM: CTA on R, diminished on left CV: RRR, no clear mgr GI: BS+, soft, nontender Derm: no breakdown MSK: atrophy of bilateral legs, normal bulk/tone arms Neuro: Heavily sedated, limiting neurologic assessment  LABS:  CBC  Recent Labs Lab 05/28/14 0355 05/29/14 0445 05/31/14 0545  WBC 11.0* 9.5 7.2  HGB 10.4* 12.2* 11.6*  HCT 34.6* 39.9 38.8*  PLT 228 232 223   Coag's No results for input(s): APTT, INR in the last 168 hours. BMET  Recent Labs Lab 05/29/14 0445 05/30/14 0603 05/30/14 1730  NA 147* 147* 145  K 4.2 4.3 4.0  CL 119* 119* 117*  CO2 21 19 18*  BUN 21 24* 23  CREATININE 7.06* 7.58* 7.19*  GLUCOSE 209* 167* 160*   Electrolytes  Recent Labs Lab 05/29/14 0445 05/30/14 0603 05/30/14 1730  CALCIUM 8.2* 7.7* 7.5*  PHOS 5.3*  --   --    Sepsis Markers  Recent Labs Lab 05/31/14 0545  PROCALCITON 2.00   ABG  Recent Labs Lab 05/27/14 0857 05/27/14 1130 05/28/14 0328  PHART 7.178* 7.260* 7.317*  PCO2ART 61.0* 49.1* 38.6  PO2ART 82.2 81.5 149.0*   Liver Enzymes  Recent Labs Lab 05/29/14 0445  AST 20  ALT 22  ALKPHOS 61  BILITOT 0.4  ALBUMIN 2.8*   Cardiac Enzymes No results for input(s): TROPONINI, PROBNP in the last 168 hours. Glucose  Recent Labs Lab 05/30/14 0759 05/30/14 1237 05/30/14 1635 05/30/14 2113 05/31/14 0101 05/31/14 0504  GLUCAP 155* 186* 160* 161* 167* 184*    CXR: CM, low volumes, increasing opacities    ASSESSMENT / PLAN:  PULMONARY OETT 4/20 >>  A: Acute resp failure, hypercarbic HCAP on admission> resolved 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 Prior trach tube Significant wheezing 4/24,  Seems improved 4/25 P:   Cont full vent support - no changes made  4/25 VAP prevention Given extubation failure and multiple acute medical issues, I favor repeat tracheostomy, will discuss with mother and then ENT if she consents Continue nebulized steroids and BDs started 4/24  CARDIOVASCULAR R IJ CVL 4/20 >>  R femoral A-line 4/24 >>  A:  Very labile BPs Persistent hypotension 4/23 > due to cardiogenic shock? S/p bradycardic arrest 4/24 New finding of cardiomyopathy by Echo 4/24 > occurred prior to cardiac arrest while on levophed, pH at time of arrest uncertain P:  Continue to hold clonidine Check ABG for pH Check Coox Cardiology consult MAP goal per renal with AKI Cont norepi  RENAL A:   AKI, nonoliguric presumably ATN secondary to IV contrast Metabolic acidosis, mild  Hypernatremia P:   Send ABG Renal service following Send BMET Monitor I/Os Correct electrolytes as indicated D5W initiated 4/24, levophed per renal  GASTROINTESTINAL A:   Chronic G tube Elevated gastric residuals Constipation P:   SUP: enteral PPI Cont TFs PRN dulcolax and miralax 4/23  HEMATOLOGIC A:   No issues P:  DVT px: SQ heparin Monitor CBC intermittently Transfuse per usual ICU guidelines  INFECTIOUS A:   HCAP on admission, resolved P:   Monitor fever curve  ENDOCRINE A:   Hyperglycemia without prior dx of DM P:   Cont SSI initiated 4/23  NEUROLOGIC A:   Klippel Feil Syndrome ADD Severe agitation causing vent asynchrony and self extubation> reviewing chart shows that he had severe agitation in ICU 04/2014, had extrapyramidal reaction to Haldol, tolerated Depakote at that time  P:   RASS goal: -3 Cont dex and fent gtts Holding adderall Start depakote, monitor LFTs  FAMILY  - Updates: Called mother's number 4/25 AM, had to leave a message  - Inter-disciplinary family meet or Palliative Care meeting due by:  4/27  My CCM time: 45 mins  Aaron Venango, MD [place] [facility] Pager: [phone] Cell: [phone] If no response, call  [phone]

## 2014-05-31 NOTE — Progress Notes (Signed)
15cc of 2500 mcg in 250 mL Fentanyl gtt wasted in sink with Gerilyn PilgrimAngela Welch, RN.   Starla Linkarolyn J Marolyn Urschel, RN

## 2014-06-01 ENCOUNTER — Inpatient Hospital Stay (HOSPITAL_COMMUNITY): Payer: Medicaid Other

## 2014-06-01 DIAGNOSIS — N170 Acute kidney failure with tubular necrosis: Secondary | ICD-10-CM

## 2014-06-01 DIAGNOSIS — I5021 Acute systolic (congestive) heart failure: Secondary | ICD-10-CM

## 2014-06-01 LAB — BLOOD GAS, ARTERIAL
Acid-base deficit: 5.6 mmol/L — ABNORMAL HIGH (ref 0.0–2.0)
Bicarbonate: 18.9 mEq/L — ABNORMAL LOW (ref 20.0–24.0)
DRAWN BY: 295031
FIO2: 0.3 %
MECHVT: 450 mL
O2 SAT: 98.9 %
PEEP/CPAP: 5 cmH2O
PH ART: 7.345 — AB (ref 7.350–7.450)
PO2 ART: 135 mmHg — AB (ref 80.0–100.0)
Patient temperature: 98.6
RATE: 20 resp/min
TCO2: 17.6 mmol/L (ref 0–100)
pCO2 arterial: 35.6 mmHg (ref 35.0–45.0)

## 2014-06-01 LAB — BASIC METABOLIC PANEL
ANION GAP: 6 (ref 5–15)
BUN: 22 mg/dL (ref 6–23)
CALCIUM: 6.8 mg/dL — AB (ref 8.4–10.5)
CO2: 20 mmol/L (ref 19–32)
CREATININE: 7.32 mg/dL — AB (ref 0.50–1.35)
Chloride: 108 mmol/L (ref 96–112)
GFR calc Af Amer: 11 mL/min — ABNORMAL LOW (ref >90)
GFR, EST NON AFRICAN AMERICAN: 9 mL/min — AB (ref >90)
GLUCOSE: 148 mg/dL — AB (ref 70–99)
Potassium: 3 mmol/L — ABNORMAL LOW (ref 3.5–5.1)
SODIUM: 134 mmol/L — AB (ref 135–145)

## 2014-06-01 LAB — GLUCOSE, CAPILLARY
GLUCOSE-CAPILLARY: 175 mg/dL — AB (ref 70–99)
GLUCOSE-CAPILLARY: 62 mg/dL — AB (ref 70–99)
Glucose-Capillary: 115 mg/dL — ABNORMAL HIGH (ref 70–99)
Glucose-Capillary: 145 mg/dL — ABNORMAL HIGH (ref 70–99)
Glucose-Capillary: 189 mg/dL — ABNORMAL HIGH (ref 70–99)
Glucose-Capillary: 191 mg/dL — ABNORMAL HIGH (ref 70–99)
Glucose-Capillary: 71 mg/dL (ref 70–99)
Glucose-Capillary: 97 mg/dL (ref 70–99)

## 2014-06-01 LAB — CBC WITH DIFFERENTIAL/PLATELET
Basophils Absolute: 0 10*3/uL (ref 0.0–0.1)
Basophils Relative: 0 % (ref 0–1)
Eosinophils Absolute: 0.3 10*3/uL (ref 0.0–0.7)
Eosinophils Relative: 6 % — ABNORMAL HIGH (ref 0–5)
HCT: 34.5 % — ABNORMAL LOW (ref 39.0–52.0)
HEMOGLOBIN: 10.7 g/dL — AB (ref 13.0–17.0)
LYMPHS ABS: 0.7 10*3/uL (ref 0.7–4.0)
Lymphocytes Relative: 17 % (ref 12–46)
MCH: 29.3 pg (ref 26.0–34.0)
MCHC: 31 g/dL (ref 30.0–36.0)
MCV: 94.5 fL (ref 78.0–100.0)
MONOS PCT: 17 % — AB (ref 3–12)
Monocytes Absolute: 0.7 10*3/uL (ref 0.1–1.0)
NEUTROS ABS: 2.6 10*3/uL (ref 1.7–7.7)
Neutrophils Relative %: 60 % (ref 43–77)
Platelets: 227 10*3/uL (ref 150–400)
RBC: 3.65 MIL/uL — AB (ref 4.22–5.81)
RDW: 14.1 % (ref 11.5–15.5)
WBC: 4.4 10*3/uL (ref 4.0–10.5)

## 2014-06-01 LAB — BRAIN NATRIURETIC PEPTIDE: B Natriuretic Peptide: 460.8 pg/mL — ABNORMAL HIGH (ref 0.0–100.0)

## 2014-06-01 LAB — TROPONIN I
TROPONIN I: 0.18 ng/mL — AB (ref <0.031)
TROPONIN I: 0.17 ng/mL — AB (ref <0.031)

## 2014-06-01 MED ORDER — MIDAZOLAM HCL 5 MG/ML IJ SOLN
1.0000 mg/h | INTRAMUSCULAR | Status: DC
  Administered 2014-06-01 (×2): 2 mg/h via INTRAVENOUS
  Administered 2014-06-01: 4 mg/h via INTRAVENOUS
  Administered 2014-06-01: 2 mg/h via INTRAVENOUS
  Administered 2014-06-01: 3 mg/h via INTRAVENOUS
  Administered 2014-06-02: 5 mg/h via INTRAVENOUS
  Administered 2014-06-02: 2 mg/h via INTRAVENOUS
  Administered 2014-06-02: 5 mg/h via INTRAVENOUS
  Administered 2014-06-02: 2 mg/h via INTRAVENOUS
  Administered 2014-06-03: 5 mg/h via INTRAVENOUS
  Filled 2014-06-01 (×8): qty 10

## 2014-06-01 MED ORDER — DEXTROSE 50 % IV SOLN
INTRAVENOUS | Status: AC
  Administered 2014-06-01: 25 mL
  Filled 2014-06-01: qty 50

## 2014-06-01 MED ORDER — SODIUM BICARBONATE 8.4 % IV SOLN
INTRAVENOUS | Status: DC
  Administered 2014-06-01 – 2014-06-02 (×2): via INTRAVENOUS
  Filled 2014-06-01 (×3): qty 150

## 2014-06-01 MED ORDER — HYDRALAZINE HCL 20 MG/ML IJ SOLN
10.0000 mg | INTRAMUSCULAR | Status: DC | PRN
  Administered 2014-06-02 – 2014-06-03 (×2): 10 mg via INTRAVENOUS
  Filled 2014-06-01 (×2): qty 1

## 2014-06-01 MED ORDER — FUROSEMIDE 10 MG/ML IJ SOLN
20.0000 mg | Freq: Once | INTRAMUSCULAR | Status: AC
  Administered 2014-06-01: 20 mg via INTRAVENOUS
  Filled 2014-06-01: qty 2

## 2014-06-01 MED ORDER — DEXTROSE 50 % IV SOLN: 25.0000 mL | Freq: Once | INTRAVENOUS | Status: AC

## 2014-06-01 MED ORDER — POTASSIUM CHLORIDE 20 MEQ/15ML (10%) PO SOLN
40.0000 meq | Freq: Once | ORAL | Status: AC
  Administered 2014-06-01: 40 meq
  Filled 2014-06-01: qty 30

## 2014-06-01 MED ORDER — CHLORHEXIDINE GLUCONATE 4 % EX LIQD
1.0000 "application " | Freq: Once | CUTANEOUS | Status: AC
  Administered 2014-06-02: 1 via TOPICAL
  Filled 2014-06-01: qty 15

## 2014-06-01 NOTE — Progress Notes (Signed)
Patient Name: [name] Date of Encounter: 06/01/2014  Active Problems:   Attention deficit disorder   Tics of organic origin   Acute encephalopathy   Tourettes syndrome   Sepsis   Abdominal pain   Aspiration pneumonia   Atelectasis of left lung   Length of Stay: 9  SUBJECTIVE  The patient is intubated and sedated.  CURRENT MEDS . antiseptic oral rinse  7 mL Mouth Rinse QID  . budesonide  0.25 mg Nebulization 4 times per day  . chlorhexidine  15 mL Mouth Rinse BID  . feeding supplement (VITAL AF 1.2 CAL)  1,000 mL Per Tube Q24H  . free water  200 mL Per Tube Q6H  . heparin subcutaneous  5,000 Units Subcutaneous 3 times per day  . insulin aspart  0-9 Units Subcutaneous 6 times per day  . ipratropium-albuterol  3 mL Nebulization Q6H  . pantoprazole sodium  40 mg Per Tube Q1200  . sodium chloride  3 mL Intravenous Q12H  . valproate sodium  1,000 mg Intravenous Q12H    OBJECTIVE  Filed Vitals:   06/01/14 0600 06/01/14 0630 06/01/14 0700 06/01/14 0800  BP:    112/89  Pulse: 69 79 75 71  Temp:    97.2 F (36.2 C)  TempSrc:    Oral  Resp: Height:      Weight:      SpO2: 100% 98% 99% 99%    Intake/Output Summary (Last 24 hours) at 06/01/14 0901 Last data filed at 06/01/14 0800  Gross per 24 hour  Intake 5313.64 ml  Output   1450 ml  Net 3863.64 ml   Filed Weights   05/28/14 0533 05/29/14 0500 05/31/14 0630  Weight: 137 lb 5.6 oz (62.3 kg) 147 lb 4.3 oz (66.8 kg) 160 lb 11.5 oz (72.9 kg)    PHYSICAL EXAM  General: Intubated, sedated Psych: Normal affect. HEENT:  Normal  Neck: short neck, difficult JVD evaluation Lungs:  Resp regular and unlabored, rales B/L Heart: RRR no s3, s4, or murmurs. Abdomen: Soft, non-tender, non-distended, BS + x 4.  Extremities: club feet, mild edema  Accessory Clinical Findings  CBC  Recent Labs  05/31/14 0545 06/01/14 0411  WBC 7.2 4.4  NEUTROABS  --  2.6  HGB 11.6* 10.7*  HCT 38.8* 34.5*  MCV  100.0 94.5  PLT 223 227   Basic Metabolic Panel  Recent Labs  05/31/14 1100 06/01/14 0411  NA 135 134*  K 3.7 3.0*  CL 112 108  CO2 15* 20  GLUCOSE 148* 148*  BUN 23 22  CREATININE 7.43* 7.32*  CALCIUM 7.0* 6.8*    Recent Labs  05/31/14 1610 05/31/14 2200 06/01/14 0411  TROPONINI 0.18* 0.18* 0.17*   Radiology/Studies  Dg Chest Port 1 View  06/01/2014   CLINICAL DATA:  Acute respiratory failure with hypoxia  EXAM: PORTABLE CHEST - 1 VIEW  COMPARISON:  05/31/2014  FINDINGS: As permitted by overlapping hardware, the endotracheal tube tip is above the carina and below the clavicular heads. A right IJ central line is in similar position, tip near the SVC origin.  There is chronic cardiomegaly. There are indistinct bibasilar lung opacities, obscuring the left diaphragm. No evidence for edema or pneumothorax. Dysmorphic ribs and scoliosis fixation.  IMPRESSION: 1. Persistent lower lobe atelectasis or pneumonia, greater on the left. 2. Endotracheal tube remains in good position.    TELE:  SR to ST, no bradycardia    ASSESSMENT AND PLAN  1. Transient bradycardia in the setting of aspiration 2. 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 Recurrent respiratory failure- intubated 4. 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 HCAP  [name] is a 24 year old functional gentleman with a history of club foot, hemiatrophy of RLE, Klippel Feil Syndrome, scoliosis s/p harrington rod surgery (2003), restrictive lung physiology, and Tourette's syndrome. He is status post a prior admission with acute respiratory failure secondary to pneumonia and had a complicated hospital course with ARDS, delirium, acute kidney injury, and he required prolonged ventilatory support and underwent tracheostomy. Following discharge, he was readmitted on 06/01/2014 with worsening mental status and respiratory failure requiring reintubation. He  has developed worsening renal function and hypotension. Yesterday at 10:19 AM while being suctioned and getting mouth care he developed significant bradycardia with heart rates dropping to 39 bpm, with apparrent sinus bradycardia. Due to posible possible PEA transient CPR was administered. Heart rate ultimately improved. He has not had any further significant bradycardia arrhythmia since. Question if initial episode mediated by a vagal response to suctioning and mouth care.  An echo Doppler study done yesterday showed moderate left ventricular hypertrophy with moderately severe new LV dysfunction with an ejection fraction of 30-35% with diffuse hypokinesis. Presently, the patient is intubated and sedated on the ventilator. His ECG shows sinus rhythm at 82 bpm. He has diffuse T-wave abnormalities in leads 1 and L, V2 through V6, and QT interval is prolonged at 509 ms. His current blood pressure is soft, he has developed acute renal failure post contrast administration, he is recovering his renal output. Based on weights, he has gained 23 lbs in the last 4 days.   I believe that his heart failure is acute (normal LVEF in 03/2014) in the settings of metabolic acidosis, acute kidney failure, his troponin elevation is flat and most probably sec to CHF and acute renal failure.   I will try a very small dose of iv Lasix 20 mg x 1 and follow renal function closely. No ACEI/ARB, once his BP improves we will start BiDil.   Signed, Lars MassonNELSON, Hillarie Harrigan H MD, [facility] 06/01/2014

## 2014-06-01 NOTE — Consult Note (Signed)
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  Trach placed 2 MAR 16 for vent dependence.  Subsequently improved and was decannulated.  Now back in hospital with resp compromise and vent dependence.  ENT called for assistance with replacement of trach.  PMH: Past Medical History  Diagnosis Date  . Tourette's syndrome   . Tic disorder   . Scoliosis   . Hemiatrophy of right leg   . Club foot     Right  . Mental retardation     mild    Surg Hx: Past Surgical History  Procedure Laterality Date  . Foot surgery Right 2002  . Eye muscle surgery Bilateral 1998 and 2002  . Femoral hernia repair      Done when he was an infant  . Harrington rod surgery  2003    For Scoliosis  . Tracheostomy tube placement N/A 04/07/2014    Procedure: TRACHEOSTOMY;  Surgeon: Jodi Marble, MD;  Location: [facility] OR;  Service: ENT;  Laterality: N/A;    FHx:   Family History  Problem Relation Age of Onset  . Breast cancer Maternal Grandmother     Died in her mid 33's  . Prostate cancer Maternal Grandfather     Died in his 24's  . Hypertension     SocHx:  reports that he has never smoked. He has never used smokeless tobacco. He reports that he does not drink alcohol or use illicit drugs.  ALLERGIES: No Known Allergies  Medications Prior to Admission  Medication Sig Dispense Refill  . cloNIDine (CATAPRES) 0.1 MG tablet TAKE 3 TABLETS BY MOUTH AT BEDTIME 270 tablet 0  . Dexmethylphenidate HCl 35 MG CP24 Take 1 capsule every morning 30 capsule 0    Results for orders placed or performed during the hospital encounter of 05/14/2014 (from the past 48 hour(s))  Glucose, capillary     Status: Abnormal   Collection Time: 05/30/14 12:37 PM  Result Value Ref Range   Glucose-Capillary 186 (H) 70 - 99 mg/dL   Comment 1 Notify RN    Comment 2 Document in Chart   Glucose, capillary     Status: Abnormal   Collection Time: 05/30/14  4:35  PM  Result Value Ref Range   Glucose-Capillary 160 (H) 70 - 99 mg/dL   Comment 1 Notify RN    Comment 2 Document in Chart   Basic metabolic panel     Status: Abnormal   Collection Time: 05/30/14  5:30 PM  Result Value Ref Range   Sodium 145 135 - 145 mmol/L   Potassium 4.0 3.5 - 5.1 mmol/L   Chloride 117 (H) 96 - 112 mmol/L   CO2 18 (L) 19 - 32 mmol/L   Glucose, Bld 160 (H) 70 - 99 mg/dL   BUN 23 6 - 23 mg/dL   Creatinine, Ser 7.19 (H) 0.50 - 1.35 mg/dL   Calcium 7.5 (L) 8.4 - 10.5 mg/dL   GFR calc non Af Amer 10 (L) >90 mL/min   GFR calc Af Amer 11 (L) >90 mL/min    Comment: (NOTE) The eGFR has been calculated using the CKD EPI equation. This calculation has not been validated in all clinical situations. eGFR's persistently <90 mL/min signify possible Chronic Kidney Disease.    Anion gap 10 5 - 15  Glucose, capillary     Status: Abnormal   Collection Time: 05/30/14  9:13 PM  Result Value  Ref Range   Glucose-Capillary 161 (H) 70 - 99 mg/dL  Glucose, capillary     Status: Abnormal   Collection Time: 05/31/14  1:01 AM  Result Value Ref Range   Glucose-Capillary 167 (H) 70 - 99 mg/dL  Glucose, capillary     Status: Abnormal   Collection Time: 05/31/14  5:04 AM  Result Value Ref Range   Glucose-Capillary 184 (H) 70 - 99 mg/dL  CBC     Status: Abnormal   Collection Time: 05/31/14  5:45 AM  Result Value Ref Range   WBC 7.2 4.0 - 10.5 K/uL   RBC 3.88 (L) 4.22 - 5.81 MIL/uL   Hemoglobin 11.6 (L) 13.0 - 17.0 g/dL   HCT 38.8 (L) 39.0 - 52.0 %   MCV 100.0 78.0 - 100.0 fL   MCH 29.9 26.0 - 34.0 pg   MCHC 29.9 (L) 30.0 - 36.0 g/dL   RDW 14.5 11.5 - 15.5 %   Platelets 223 150 - 400 K/uL  Procalcitonin     Status: None   Collection Time: 05/31/14  5:45 AM  Result Value Ref Range   Procalcitonin 2.00 ng/mL    Comment:        Interpretation: PCT > 0.5 ng/mL and <= 2 ng/mL: Systemic infection (sepsis) is possible, but other conditions are known to elevate PCT as  well. (NOTE)         ICU PCT Algorithm               Non ICU PCT Algorithm    ----------------------------     ------------------------------         PCT < 0.25 ng/mL                 PCT < 0.1 ng/mL     Stopping of antibiotics            Stopping of antibiotics       strongly encouraged.               strongly encouraged.    ----------------------------     ------------------------------       PCT level decrease by               PCT < 0.25 ng/mL       >= 80% from peak PCT       OR PCT 0.25 - 0.5 ng/mL          Stopping of antibiotics                                             encouraged.     Stopping of antibiotics           encouraged.    ----------------------------     ------------------------------       PCT level decrease by              PCT >= 0.25 ng/mL       < 80% from peak PCT        AND PCT >= 0.5 ng/mL             Continuing antibiotics  encouraged.       Continuing antibiotics            encouraged.    ----------------------------     ------------------------------     PCT level increase compared          PCT > 0.5 ng/mL         with peak PCT AND          PCT >= 0.5 ng/mL             Escalation of antibiotics                                          strongly encouraged.      Escalation of antibiotics        strongly encouraged.   Glucose, capillary     Status: Abnormal   Collection Time: 05/31/14  8:15 AM  Result Value Ref Range   Glucose-Capillary 201 (H) 70 - 99 mg/dL   Comment 1 Notify RN    Comment 2 Document in Chart   Carboxyhemoglobin     Status: Abnormal   Collection Time: 05/31/14 10:00 AM  Result Value Ref Range   Total hemoglobin 11.0 (L) 13.5 - 18.0 g/dL   O2 Saturation 89.2 %   Carboxyhemoglobin 0.9 0.5 - 1.5 %   Methemoglobin 0.7 0.0 - 1.5 %  Blood gas, arterial     Status: Abnormal   Collection Time: 05/31/14 10:30 AM  Result Value Ref Range   FIO2 0.30 %   Delivery systems VENTILATOR    Mode  PRESSURE REGULATED VOLUME CONTROL    VT 450 mL   Rate 12 resp/min   Peep/cpap 5.0 cm H20   pH, Arterial 7.126 (LL) 7.350 - 7.450    Comment: CRITICAL RESULT CALLED TO, READ BACK BY AND VERIFIED WITH: DR MCQUAID,MD AT 1005 BY KENNAN DEPUE,RRT,RCP ON 05/31/2014    pCO2 arterial 53.9 (H) 35.0 - 45.0 mmHg   pO2, Arterial 130.0 (H) 80.0 - 100.0 mmHg   Bicarbonate 17.0 (L) 20.0 - 24.0 mEq/L   TCO2 16.6 0 - 100 mmol/L   Acid-base deficit 12.0 (H) 0.0 - 2.0 mmol/L   O2 Saturation 98.3 %   Patient temperature 98.6    Collection site A-LINE    Drawn by [phone]    Sample type ARTERIAL DRAW   Basic metabolic panel     Status: Abnormal   Collection Time: 05/31/14 11:00 AM  Result Value Ref Range   Sodium 135 135 - 145 mmol/L    Comment: RESULT REPEATED AND VERIFIED DELTA CHECK NOTED    Potassium 3.7 3.5 - 5.1 mmol/L   Chloride 112 96 - 112 mmol/L   CO2 15 (L) 19 - 32 mmol/L   Glucose, Bld 148 (H) 70 - 99 mg/dL   BUN 23 6 - 23 mg/dL   Creatinine, Ser 7.43 (H) 0.50 - 1.35 mg/dL   Calcium 7.0 (L) 8.4 - 10.5 mg/dL   GFR calc non Af Amer 9 (L) >90 mL/min   GFR calc Af Amer 11 (L) >90 mL/min    Comment: (NOTE) The eGFR has been calculated using the CKD EPI equation. This calculation has not been validated in all clinical situations. eGFR's persistently <90 mL/min signify possible Chronic Kidney Disease.    Anion gap 8 5 - 15  Glucose, capillary     Status: Abnormal   Collection Time:  05/31/14 12:38 PM  Result Value Ref Range   Glucose-Capillary 143 (H) 70 - 99 mg/dL   Comment 1 Notify RN    Comment 2 Document in Chart   Troponin I     Status: Abnormal   Collection Time: 05/31/14  4:10 PM  Result Value Ref Range   Troponin I 0.18 (H) <0.031 ng/mL    Comment:        PERSISTENTLY INCREASED TROPONIN VALUES IN THE RANGE OF 0.04-0.49 ng/mL CAN BE SEEN IN:       -UNSTABLE ANGINA       -CONGESTIVE HEART FAILURE       -MYOCARDITIS       -CHEST TRAUMA       -ARRYHTHMIAS       -LATE  PRESENTING MYOCARDIAL INFARCTION       -COPD   CLINICAL FOLLOW-UP RECOMMENDED.   Glucose, capillary     Status: Abnormal   Collection Time: 05/31/14  4:17 PM  Result Value Ref Range   Glucose-Capillary 152 (H) 70 - 99 mg/dL   Comment 1 Notify RN    Comment 2 Document in Chart   Troponin I     Status: Abnormal   Collection Time: 05/31/14 10:00 PM  Result Value Ref Range   Troponin I 0.18 (H) <0.031 ng/mL    Comment:        PERSISTENTLY INCREASED TROPONIN VALUES IN THE RANGE OF 0.04-0.49 ng/mL CAN BE SEEN IN:       -UNSTABLE ANGINA       -CONGESTIVE HEART FAILURE       -MYOCARDITIS       -CHEST TRAUMA       -ARRYHTHMIAS       -LATE PRESENTING MYOCARDIAL INFARCTION       -COPD   CLINICAL FOLLOW-UP RECOMMENDED.   Glucose, capillary     Status: Abnormal   Collection Time: 06/01/14 12:18 AM  Result Value Ref Range   Glucose-Capillary 145 (H) 70 - 99 mg/dL  Troponin I     Status: Abnormal   Collection Time: 06/01/14  4:11 AM  Result Value Ref Range   Troponin I 0.17 (H) <0.031 ng/mL    Comment:        PERSISTENTLY INCREASED TROPONIN VALUES IN THE RANGE OF 0.04-0.49 ng/mL CAN BE SEEN IN:       -UNSTABLE ANGINA       -CONGESTIVE HEART FAILURE       -MYOCARDITIS       -CHEST TRAUMA       -ARRYHTHMIAS       -LATE PRESENTING MYOCARDIAL INFARCTION       -COPD   CLINICAL FOLLOW-UP RECOMMENDED.   Basic metabolic panel     Status: Abnormal   Collection Time: 06/01/14  4:11 AM  Result Value Ref Range   Sodium 134 (L) 135 - 145 mmol/L   Potassium 3.0 (L) 3.5 - 5.1 mmol/L    Comment: DELTA CHECK NOTED REPEATED TO VERIFY    Chloride 108 96 - 112 mmol/L   CO2 20 19 - 32 mmol/L   Glucose, Bld 148 (H) 70 - 99 mg/dL   BUN 22 6 - 23 mg/dL   Creatinine, Ser 7.32 (H) 0.50 - 1.35 mg/dL   Calcium 6.8 (L) 8.4 - 10.5 mg/dL   GFR calc non Af Amer 9 (L) >90 mL/min   GFR calc Af Amer 11 (L) >90 mL/min    Comment: (NOTE) The eGFR has been calculated using the CKD EPI equation.  This  calculation has not been validated in all clinical situations. eGFR's persistently <90 mL/min signify possible Chronic Kidney Disease.    Anion gap 6 5 - 15  CBC with Differential/Platelet     Status: Abnormal   Collection Time: 06/01/14  4:11 AM  Result Value Ref Range   WBC 4.4 4.0 - 10.5 K/uL   RBC 3.65 (L) 4.22 - 5.81 MIL/uL   Hemoglobin 10.7 (L) 13.0 - 17.0 g/dL   HCT 34.5 (L) 39.0 - 52.0 %   MCV 94.5 78.0 - 100.0 fL   MCH 29.3 26.0 - 34.0 pg   MCHC 31.0 30.0 - 36.0 g/dL   RDW 14.1 11.5 - 15.5 %   Platelets 227 150 - 400 K/uL   Neutrophils Relative % 60 43 - 77 %   Neutro Abs 2.6 1.7 - 7.7 K/uL   Lymphocytes Relative 17 12 - 46 %   Lymphs Abs 0.7 0.7 - 4.0 K/uL   Monocytes Relative 17 (H) 3 - 12 %   Monocytes Absolute 0.7 0.1 - 1.0 K/uL   Eosinophils Relative 6 (H) 0 - 5 %   Eosinophils Absolute 0.3 0.0 - 0.7 K/uL   Basophils Relative 0 0 - 1 %   Basophils Absolute 0.0 0.0 - 0.1 K/uL  Brain natriuretic peptide     Status: Abnormal   Collection Time: 06/01/14  4:11 AM  Result Value Ref Range   B Natriuretic Peptide 460.8 (H) 0.0 - 100.0 pg/mL  Glucose, capillary     Status: Abnormal   Collection Time: 06/01/14  5:02 AM  Result Value Ref Range   Glucose-Capillary 191 (H) 70 - 99 mg/dL  Glucose, capillary     Status: Abnormal   Collection Time: 06/01/14  7:54 AM  Result Value Ref Range   Glucose-Capillary 175 (H) 70 - 99 mg/dL   Comment 1 Notify RN    Comment 2 Document in Chart    Dg Chest Port 1 View  06/01/2014   CLINICAL DATA:  Acute respiratory failure with hypoxia  EXAM: PORTABLE CHEST - 1 VIEW  COMPARISON:  05/31/2014  FINDINGS: As permitted by overlapping hardware, the endotracheal tube tip is above the carina and below the clavicular heads. A right IJ central line is in similar position, tip near the SVC origin.  There is chronic cardiomegaly. There are indistinct bibasilar lung opacities, obscuring the left diaphragm. No evidence for edema or pneumothorax.  Dysmorphic ribs and scoliosis fixation.  IMPRESSION: 1. Persistent lower lobe atelectasis or pneumonia, greater on the left. 2. Endotracheal tube remains in good position.   Electronically Signed   By: Monte Fantasia M.D.   On: 06/01/2014 07:38   Dg Chest Port 1 View  05/31/2014   CLINICAL DATA:  Respiratory failure intubated patient, scoliosis.  EXAM: PORTABLE CHEST - 1 VIEW  COMPARISON:  Portable chest x-ray of May 30, 2014  FINDINGS: The lungs are slightly better inflated. There remains increased density in the retrocardiac region on the left. The interstitial markings bilaterally are prominent but stable. The right internal jugular venous catheter tip projects over the proximal SVC. There is severe scoliosis with presence of spinal stabilization hardware which is stable. Deformity of the left chest wall is present.  IMPRESSION: Overall improved aeration of the lungs with persistent atelectasis or pneumonia in the left lower lobe.   Electronically Signed   By: David  Martinique M.D.   On: 05/31/2014 07:36      Blood pressure 112/89, pulse 71, temperature 97.2  F (36.2 C), temperature source Oral, resp. rate 20, height _0  (1.499 m), weight 72.9 kg (160 lb 11.5 oz), SpO2 99 %.  PHYSICAL EXAM: Overall appearance:  Obtunded/sedated.  Short neck with kyphosis. Head:  NCAT Ears: not examined Nose:  Not examiend Oral Cavity:  Not examined Oral Pharynx/Hypopharynx/Larynx:  Not examined Neuro:  Not examined Neck:  Short, twisted, RIGHT IJ central line    Assessment/Plan Vent dependence.  Klippel Feil syndrome.  On schedule for tracheostomy tomorrow late morning.  Will obtain consent from family  Given difficulty anatomy and mod difficult prior tracheostomy, along with relatively rapid relapse into respiratory compromise, I would  recommend against decannulation unless his overall status is dramatically improved in some semi-permanent fashion.  He could be downsized to a cuffless tube,  and  use a [employer] valve for speech which should suffice for most of his needs.  This will also allow for long term pulmonary toilet, and relatively easy switch to cuffed tube should ventilation or pressure support be needed again.   Jodi Marble 4/38/3654, 9:16 AM

## 2014-06-01 NOTE — Progress Notes (Signed)
PULMONARY / CRITICAL CARE MEDICINE   Name: [name] MRN: [id number] DOB: 02/04/1991    ADMISSION DATE:  05/17/2014 CONSULTATION DATE:  06/01/2014  REFERRING MD :  [name]  CHIEF COMPLAINT:  resp distress  INITIAL PRESENTATION: 24 y.o with long admission in 04/2014 for ARDS, tstomy decannulated 04/27/14, PEG in situ re admitted 4/17 for altered mental status, which improved. Intent was to remove PEG on 4/22. He developed progressive AKI. PCCM consulted for acute resp acidosis on 4/20 after swallow study ? Aspiration. PMH of club foot, hemiatrophy of RLE, Klippel Feil Syndrome, scoliosis s/p harrington rod surgery (2003), Restrictive lung physiology, Tourette's syndrome [facility] is very functional [facility] lives with mom  STUDIES:  4/18 CT chest/ abd/pelvis:  BLL atx/ pna 4/24 Echocardiogram (performed prior to bradycardic arrest): Moderate LVH, LVEF 30-35%, decreased from LVEF 50-55% 04/05/14  SIGNIFICANT EVENTS: 4/20 swallow eval >> failed 4/20 intubated for resp acidosis 4/23 Failed self extubation due to rapid and severe hypoxemia 4/24 bradycardic arrest triggered by severe agitation during mouth care with approx 5 mins chest compressions and 2 amps epi 4/25 Cardiology consult for LVEF 30-35%, started on bicarb gtt, ENT consulted for trach  SUBJECTIVE:  Required versed 4mg  q2h overnight Started on Bicarb gtt Cardiology consult for LVEF 30-35%, started on bicarb gtt, ENT consulted for trach  VITAL SIGNS: Temp:  [96.1 F (35.6 C)-97.5 F (36.4 C)] 97.2 F (36.2 C) (04/26 0800) Pulse Rate:  [69-107] 71 (04/26 0800) Resp:  [12-20] 20 (04/26 0800) BP: (109-129)/(81-91) 112/89 mmHg (04/26 0800) SpO2:  [82 %-100 %] 99 % (04/26 0800) FiO2 (%):  [30 %] 30 % (04/26 0400) HEMODYNAMICS: CVP:  [25 mmHg] 25 mmHg VENTILATOR SETTINGS: Vent Mode:  [-] PRVC FiO2 (%):  [30 %] 30 % Set Rate:  [12 bmp-20 bmp] 20 bmp Vt Set:  [450 mL] 450 mL PEEP:  [5 cmH20] 5 cmH20 Plateau Pressure:  [15  cmH20-47 cmH20] 15 cmH20 INTAKE / OUTPUT:  Intake/Output Summary (Last 24 hours) at 06/01/14 0837 Last data filed at 06/01/14 0800  Gross per 24 hour  Intake 5528.3 ml  Output   1450 ml  Net 4078.3 ml    PHYSICAL EXAMINATION: Gen: sedated on vent HENT: NCAT, ETT in place, short neck, trach scar well healed PULM: CTA on R, improved aeration on left CV: RRR, no clear mgr GI: BS+, soft, nontender Derm: no breakdown MSK: atrophy of bilateral legs, normal bulk/tone arms with pitting edema Neuro: Heavily sedated, intermittently agitated per nursing  LABS:  CBC  Recent Labs Lab 05/29/14 0445 05/31/14 0545 06/01/14 0411  WBC 9.5 7.2 4.4  HGB 12.2* 11.6* 10.7*  HCT 39.9 38.8* 34.5*  PLT 232 223 227   Coag's No results for input(s): APTT, INR in the last 168 hours. BMET  Recent Labs Lab 05/30/14 1730 05/31/14 1100 06/01/14 0411  NA 145 135 134*  K 4.0 3.7 3.0*  CL 117* 112 108  CO2 18* 15* 20  BUN 23 23 22   CREATININE 7.19* 7.43* 7.32*  GLUCOSE 160* 148* 148*   Electrolytes  Recent Labs Lab 05/29/14 0445  05/30/14 1730 05/31/14 1100 06/01/14 0411  CALCIUM 8.2*  < > 7.5* 7.0* 6.8*  PHOS 5.3*  --   --   --   --   < > = values in this interval not displayed. Sepsis Markers  Recent Labs Lab 05/31/14 0545  PROCALCITON 2.00   ABG  Recent Labs Lab 05/27/14 1130 05/28/14 0328 05/31/14 1030  PHART  7.260* 7.317* 7.126*  PCO2ART 49.1* 38.6 53.9*  PO2ART 81.5 149.0* 130.0*   Liver Enzymes  Recent Labs Lab 05/29/14 0445  AST 20  ALT 22  ALKPHOS 61  BILITOT 0.4  ALBUMIN 2.8*   Cardiac Enzymes  Recent Labs Lab 05/31/14 1610 05/31/14 2200 06/01/14 0411  TROPONINI 0.18* 0.18* 0.17*   Glucose  Recent Labs Lab 05/31/14 0504 05/31/14 0815 05/31/14 1238 05/31/14 1617 06/01/14 0018 06/01/14 0754  GLUCAP 184* 201* 143* 152* 145* 175*    CXR: CM, low volumes, increasing opacities    ASSESSMENT / PLAN:  PULMONARY OETT 4/20 >>   A: Acute resp failure, hypercarbic in setting of HCAP and renal failure 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 Prior trach tube Wheezing> improved with inhaled steroid P:   Continue full vent support, Rate increased 4/26 for acidosis, check ABG VAP prevention ENT to perform redo tracheostomy, appreciate consult Continue nebulized steroids and BDs started 4/24  CARDIOVASCULAR R IJ CVL 4/20 >>  R femoral A-line 4/24 >>  A:  Persistent hypotension 4/23 > due to cardiogenic shock?, improved 4/25 S/p bradycardic arrest 4/24 New finding of cardiomyopathy by Echo 4/24 > presumably due to acidosis P:  Check Coox Wean neo to off F/u cardiology recs  RENAL A:   AKI, nonoliguric presumably ATN secondary to IV contrast Metabolic acidosis, mild  Anasarca due to volume administered P:   Continue bicarb gtt F/u renal recs Consider lasix 4/26, defer to renal Send BMET Monitor UOP, electrolytes  GASTROINTESTINAL A:   Chronic G tube Elevated gastric residuals Constipation P:   SUP: enteral PPI Cont TFs PRN dulcolax and miralax 4/23  HEMATOLOGIC A:   No issues P:  DVT px: SQ heparin Monitor CBC intermittently Transfuse per usual ICU guidelines  INFECTIOUS A:   HCAP on admission, resolved P:   Monitor fever curve  ENDOCRINE A:   Hyperglycemia without prior dx of DM P:   Cont SSI initiated 4/23  NEUROLOGIC A:   Klippel Feil Syndrome ADD Severe agitation causing vent asynchrony and self extubation> reviewing chart shows that he had severe agitation in ICU 04/2014, had extrapyramidal reaction to Haldol, tolerated Depakote at that time  P:   RASS goal: -3 Stop precedex, ineffective Continue fentanyl and add versed gtt Holding adderall Continue depakote  FAMILY  - Updates: update mother by phone 4/25  My CCM time: 35 mins  Heber Covington, MD [facility] Pager: [phone] Cell: [phone] If no response, call [phone]

## 2014-06-01 NOTE — Progress Notes (Signed)
eLink Physician-Brief Progress Note Patient Name: [name] DOB: 04/18/1990 MRN: [id number]   Date of Service  06/01/2014  HPI/Events of Note  Hypokalemia in the setting of AKI  eICU Interventions  Potassium replaced 40 mEq x 1     Intervention Category Intermediate Interventions: Electrolyte abnormality - evaluation and management  DETERDING,ELIZABETH 06/01/2014, 5:58 AM

## 2014-06-01 NOTE — Progress Notes (Signed)
Patient ID: [name], male   DOB: 05/29/1990, 24 y.o.   MRN: [id number]  [name] Progress Note    Assessment/ Plan:   1. AKI: Data/timeline of events is suggestive of contrast-induced nephropathy versus ischemic ATN. Creatinine appears to have reached a plateau-he remains nonoliguric and without any acute dialysis needs. Started on hypotonic sodium bicarbonate drip yesterday to help correct hypernatremia/metabolic acidosis with good improvement-convert to isotonic sodium bicarbonate today and reduce rate to 50 mL/h. 2. Ventilatory dependent respiratory failure/structural lung disease: With CO2 retention and adjustments made to ventilator by critical care. Plans noted for tracheostomy tube. 3. Hypokalemia: With limited potassium intake and likely intracellular shifts with ongoing sodium bicarbonate treatment-replaced via enteral route 4. Mixed gap/non-gap Metabolic acidosis: Likely associated with acute renal failure- improving with bicarbonate gtt 5. Hyponatremia: Mild and secondary to hypotonic fluids-switch to isotonic fluids and agree with single dose of furosemide  Subjective:   No acute events overnight-seen by ENT surgery earlier today for possible tracheostomy    Objective:   BP 136/93 mmHg  Pulse 70  Temp(Src) 97.1 F (36.2 C) (Oral)  Resp 20  Ht 4\' 11"  (1.499 m)  Wt 72.9 kg (160 lb 11.5 oz)  BMI 32.44 kg/m2  SpO2 100%  Intake/Output Summary (Last 24 hours) at 06/01/14 1308 Last data filed at 06/01/14 1200  Gross per 24 hour  Intake 4958.2 ml  Output   1810 ml  Net 3148.2 ml   Weight change:   Physical Exam: Gen: Comfortable on the vent CVS: Pulse regular in rate and rhythm, S1 and S2 normal Resp: Clear to auscultation, no rales Abd: Soft, obese, nontender Ext:RLE hemiatrophy, 1+ LLE edema  Imaging: Dg Chest Port 1 View  06/01/2014   CLINICAL DATA:  Acute respiratory failure with hypoxia  EXAM: PORTABLE CHEST - 1 VIEW  COMPARISON:  05/31/2014   FINDINGS: As permitted by overlapping hardware, the endotracheal tube tip is above the carina and below the clavicular heads. A right IJ central line is in similar position, tip near the SVC origin.  There is chronic cardiomegaly. There are indistinct bibasilar lung opacities, obscuring the left diaphragm. No evidence for edema or pneumothorax. Dysmorphic ribs and scoliosis fixation.  IMPRESSION: 1. Persistent lower lobe atelectasis or pneumonia, greater on the left. 2. Endotracheal tube remains in good position.   Electronically Signed   By: Marnee SpringJonathon  Watts M.D.   On: 06/01/2014 07:38   Dg Chest Port 1 View  05/31/2014   CLINICAL DATA:  Respiratory failure intubated patient, scoliosis.  EXAM: PORTABLE CHEST - 1 VIEW  COMPARISON:  Portable chest x-ray of May 30, 2014  FINDINGS: The lungs are slightly better inflated. There remains increased density in the retrocardiac region on the left. The interstitial markings bilaterally are prominent but stable. The right internal jugular venous catheter tip projects over the proximal SVC. There is severe scoliosis with presence of spinal stabilization hardware which is stable. Deformity of the left chest wall is present.  IMPRESSION: Overall improved aeration of the lungs with persistent atelectasis or pneumonia in the left lower lobe.   Electronically Signed   By: David  SwazilandJordan M.D.   On: 05/31/2014 07:36    Labs: BMET  Recent Labs Lab 05/27/14 0414 05/28/14 1030 05/29/14 0445 05/30/14 0603 05/30/14 1730 05/31/14 1100 06/01/14 0411  NA 147* 146* 147* 147* 145 135 134*  K 4.5 3.8 4.2 4.3 4.0 3.7 3.0*  CL 115* 119* 119* 119* 117* 112 108  CO2 24 21 21  19 18* 15* 20  GLUCOSE 71 72 209* 167* 160* 148* 148*  BUN 24* CREATININE 5.35* 6.72* 7.06* 7.58* 7.19* 7.43* 7.32*  CALCIUM 7.9* 7.5* 8.2* 7.7* 7.5* 7.0* 6.8*  PHOS  --   --  5.3*  --   --   --   --    CBC  Recent Labs Lab 05/28/14 0355 05/29/14 0445 05/31/14 0545  06/01/14 0411  WBC 11.0* 9.5 7.2 4.4  NEUTROABS  --   --   --  2.6  HGB 10.4* 12.2* 11.6* 10.7*  HCT 34.6* 39.9 38.8* 34.5*  MCV 98.3 96.4 100.0 94.5  PLT 228 232 223 227    Medications:    . antiseptic oral rinse  7 mL Mouth Rinse QID  . budesonide  0.25 mg Nebulization 4 times per day  . chlorhexidine  1 application Topical Once  . [START ON 06-21-14] chlorhexidine  1 application Topical Once  . chlorhexidine  15 mL Mouth Rinse BID  . feeding supplement (VITAL AF 1.2 CAL)  1,000 mL Per Tube Q24H  . free water  200 mL Per Tube Q6H  . heparin subcutaneous  5,000 Units Subcutaneous 3 times per day  . insulin aspart  0-9 Units Subcutaneous 6 times per day  . ipratropium-albuterol  3 mL Nebulization Q6H  . pantoprazole sodium  40 mg Per Tube Q1200  . sodium chloride  3 mL Intravenous Q12H  . valproate sodium  1,000 mg Intravenous Q12H   Zetta Bills, MD 06/01/2014, 1:08 PM

## 2014-06-01 NOTE — Progress Notes (Signed)
eLink Physician-Brief Progress Note Patient Name: Tae [name] DOB: 02/18/1990 MRN: [id number]   Date of Service  06/01/2014  HPI/Events of Note  HTN - SBP 180-190.  eICU Interventions  Will order Hydralazine 10 mg IV Q 1 hour PRN.     Intervention Category Intermediate Interventions: Hypertension - evaluation and management  Sommer,[name] 06/01/2014, 3:45 PM

## 2014-06-01 NOTE — Progress Notes (Signed)
NUTRITION FOLLOW UP  Intervention:   If medically able, consider addition of prokinetic agent such as Reglan.   When medically able, recommend resuming Vital AF 1.2 @ 20 ml/hr via peg tube and increase by 10 ml every 4 hours to goal rate of 60 ml/hr vs trickle feeds.   Tube feeding goal rate provides 1728 kcal (98% of needs), 108 grams of protein, and 1166 ml of H2O.   Nutrition Dx:   Inadequate oral intake related to inability to eat as evidenced by NPO; ongoing  Goal:   Pt to meet >/= 90% of their estimated nutrition needs; not met  Monitor:   TF initiation and tolerance, weight trend, vent status/settings, labs  Assessment:   24 y.o with long admission in 04/2014 for ARDS, tstomy decannulated 04/27/14, PEG in situ re admitted 4/17 for altered mental status, which improved. Intent was to remove PEG on 4/22. He developed progressive AKI. PCCM consulted for acute resp acidosis on 4/20 after swallow study ? aspiration PMH of club foot, hemiatrophy of RLE, Klippel Feil Syndrome, scoliosis s/p harrington rod surgery (2003), Restrictive lung disease, & Tourette's syndrome / Tic disorder  -Intubated 4/20 -Failed self-extubation 4/23 -Bradycardiac arrest triggered by severe agitation during mouth care with approx 5 mins chest compressions and 2 amps epi 4/24  - Pt with 13 lb wt loss in one month. (9%- significant for time frame) - No signs of fat or muscle wasting. - Suspect some form of malnutrition, but pt does not meet criteria at this time.   4/26: - Pt scheduled for trach placement tomorrow 4/27 - Pt with high residuals (~550 mL), per RN TF to be held until 1 pm. Pt with high residuals over past 1-2 days of 300-500 mL.  - 2 bm last night  Labs and medications reviewed  Na 134  K 3.0  Patient is currently intubated on ventilator support MV: 9.0 L/min Temp (24hrs), Avg:96.8 F (36 C), Min:96.1 F (35.6 C), Max:97.5 F (36.4 C)  Propofol: none  Height: Ht Readings from  Last 1 Encounters:  05/29/14 $RemoveB'4\' 11"'WNigOXPO$  (1.499 m)    Weight Status:   Wt Readings from Last 1 Encounters:  05/31/14 160 lb 11.5 oz (72.9 kg)    Re-estimated needs:  Kcal: 1638 Protein: 90-105 g Fluid: 1.7 L/day  Skin: intact  Diet Order: Diet NPO time specified   Intake/Output Summary (Last 24 hours) at 06/01/14 1233 Last data filed at 06/01/14 1100  Gross per 24 hour  Intake 5062.1 ml  Output   1560 ml  Net 3502.1 ml    Last BM: 4/26   Labs:   Recent Labs Lab 05/29/14 0445  05/30/14 1730 05/31/14 1100 06/01/14 0411  NA 147*  < > 145 135 134*  K 4.2  < > 4.0 3.7 3.0*  CL 119*  < > 117* 112 108  CO2 21  < > 18* 15* 20  BUN 21  < > $R'23 23 22  'jc$ CREATININE 7.06*  < > 7.19* 7.43* 7.32*  CALCIUM 8.2*  < > 7.5* 7.0* 6.8*  PHOS 5.3*  --   --   --   --   GLUCOSE 209*  < > 160* 148* 148*  < > = values in this interval not displayed.  CBG (last 3)   Recent Labs  06/01/14 0018 06/01/14 0502 06/01/14 0754  GLUCAP 145* 191* 175*    Scheduled Meds: . antiseptic oral rinse  7 mL Mouth Rinse QID  . budesonide  0.25  mg Nebulization 4 times per day  . chlorhexidine  1 application Topical Once  . [START ON 05/22/2014] chlorhexidine  1 application Topical Once  . chlorhexidine  15 mL Mouth Rinse BID  . feeding supplement (VITAL AF 1.2 CAL)  1,000 mL Per Tube Q24H  . free water  200 mL Per Tube Q6H  . heparin subcutaneous  5,000 Units Subcutaneous 3 times per day  . insulin aspart  0-9 Units Subcutaneous 6 times per day  . ipratropium-albuterol  3 mL Nebulization Q6H  . pantoprazole sodium  40 mg Per Tube Q1200  . sodium chloride  3 mL Intravenous Q12H  . valproate sodium  1,000 mg Intravenous Q12H    Continuous Infusions: . fentaNYL infusion INTRAVENOUS 400 mcg/hr (06/01/14 1100)  . midazolam (VERSED) infusion 3 mg/hr (06/01/14 1100)  . norepinephrine (LEVOPHED) Adult infusion Stopped (05/31/14 0845)  .  sodium bicarbonate  infusion 1000 mL 100 mL/hr at 06/01/14  [address] [place], [place], [place]

## 2014-06-02 ENCOUNTER — Encounter (HOSPITAL_COMMUNITY): Payer: Self-pay | Admitting: Registered Nurse

## 2014-06-02 ENCOUNTER — Inpatient Hospital Stay (HOSPITAL_COMMUNITY): Payer: Medicaid Other | Admitting: Anesthesiology

## 2014-06-02 ENCOUNTER — Encounter (HOSPITAL_COMMUNITY): Admission: EM | Disposition: E | Payer: Self-pay | Source: Home / Self Care | Attending: Pulmonary Disease

## 2014-06-02 DIAGNOSIS — J9621 Acute and chronic respiratory failure with hypoxia: Secondary | ICD-10-CM

## 2014-06-02 DIAGNOSIS — E876 Hypokalemia: Secondary | ICD-10-CM

## 2014-06-02 DIAGNOSIS — I420 Dilated cardiomyopathy: Secondary | ICD-10-CM

## 2014-06-02 DIAGNOSIS — N179 Acute kidney failure, unspecified: Secondary | ICD-10-CM | POA: Insufficient documentation

## 2014-06-02 HISTORY — PX: TRACHEOSTOMY TUBE PLACEMENT: SHX814

## 2014-06-02 LAB — CBC WITH DIFFERENTIAL/PLATELET
BASOS ABS: 0 10*3/uL (ref 0.0–0.1)
BASOS PCT: 1 % (ref 0–1)
Eosinophils Absolute: 0.3 10*3/uL (ref 0.0–0.7)
Eosinophils Relative: 7 % — ABNORMAL HIGH (ref 0–5)
HCT: 34.4 % — ABNORMAL LOW (ref 39.0–52.0)
Hemoglobin: 11 g/dL — ABNORMAL LOW (ref 13.0–17.0)
LYMPHS PCT: 19 % (ref 12–46)
Lymphs Abs: 0.7 10*3/uL (ref 0.7–4.0)
MCH: 29.7 pg (ref 26.0–34.0)
MCHC: 32 g/dL (ref 30.0–36.0)
MCV: 93 fL (ref 78.0–100.0)
Monocytes Absolute: 0.7 10*3/uL (ref 0.1–1.0)
Monocytes Relative: 18 % — ABNORMAL HIGH (ref 3–12)
NEUTROS ABS: 2.1 10*3/uL (ref 1.7–7.7)
Neutrophils Relative %: 55 % (ref 43–77)
PLATELETS: 233 10*3/uL (ref 150–400)
RBC: 3.7 MIL/uL — ABNORMAL LOW (ref 4.22–5.81)
RDW: 14.1 % (ref 11.5–15.5)
WBC: 3.8 10*3/uL — ABNORMAL LOW (ref 4.0–10.5)

## 2014-06-02 LAB — BASIC METABOLIC PANEL
ANION GAP: 9 (ref 5–15)
Anion gap: 7 (ref 5–15)
BUN: 21 mg/dL (ref 6–23)
BUN: 22 mg/dL (ref 6–23)
CHLORIDE: 108 mmol/L (ref 96–112)
CO2: 23 mmol/L (ref 19–32)
CO2: 25 mmol/L (ref 19–32)
Calcium: 6.8 mg/dL — ABNORMAL LOW (ref 8.4–10.5)
Calcium: 6.9 mg/dL — ABNORMAL LOW (ref 8.4–10.5)
Chloride: 108 mmol/L (ref 96–112)
Creatinine, Ser: 6.69 mg/dL — ABNORMAL HIGH (ref 0.50–1.35)
Creatinine, Ser: 6.92 mg/dL — ABNORMAL HIGH (ref 0.50–1.35)
GFR calc Af Amer: 12 mL/min — ABNORMAL LOW (ref >90)
GFR calc non Af Amer: 10 mL/min — ABNORMAL LOW (ref >90)
GFR, EST AFRICAN AMERICAN: 12 mL/min — AB (ref >90)
GFR, EST NON AFRICAN AMERICAN: 11 mL/min — AB (ref >90)
GLUCOSE: 114 mg/dL — AB (ref 70–99)
Glucose, Bld: 79 mg/dL (ref 70–99)
POTASSIUM: 2.8 mmol/L — AB (ref 3.5–5.1)
POTASSIUM: 3.6 mmol/L (ref 3.5–5.1)
SODIUM: 138 mmol/L (ref 135–145)
SODIUM: 142 mmol/L (ref 135–145)

## 2014-06-02 LAB — HEPATIC FUNCTION PANEL
ALK PHOS: 45 U/L (ref 39–117)
ALT: 12 U/L (ref 0–53)
AST: 13 U/L (ref 0–37)
Albumin: 2.3 g/dL — ABNORMAL LOW (ref 3.5–5.2)
Bilirubin, Direct: <0.1 mg/dL (ref 0.0–0.5)
TOTAL PROTEIN: 5.1 g/dL — AB (ref 6.0–8.3)
Total Bilirubin: 0.4 mg/dL (ref 0.3–1.2)

## 2014-06-02 LAB — GLUCOSE, CAPILLARY
GLUCOSE-CAPILLARY: 89 mg/dL (ref 70–99)
GLUCOSE-CAPILLARY: 65 mg/dL — AB (ref 70–99)
GLUCOSE-CAPILLARY: 130 mg/dL — AB (ref 70–99)
GLUCOSE-CAPILLARY: 63 mg/dL — AB (ref 70–99)
GLUCOSE-CAPILLARY: 80 mg/dL (ref 70–99)
Glucose-Capillary: 95 mg/dL (ref 70–99)

## 2014-06-02 SURGERY — CREATION, TRACHEOSTOMY
Anesthesia: General | Site: Neck

## 2014-06-02 MED ORDER — DEXTROSE 50 % IV SOLN
INTRAVENOUS | Status: AC
  Filled 2014-06-02: qty 50

## 2014-06-02 MED ORDER — PROPOFOL 10 MG/ML IV BOLUS
INTRAVENOUS | Status: AC
  Filled 2014-06-02: qty 20

## 2014-06-02 MED ORDER — MIDAZOLAM HCL 2 MG/2ML IJ SOLN
INTRAMUSCULAR | Status: AC
  Filled 2014-06-02: qty 2

## 2014-06-02 MED ORDER — DEXTROSE 10 % IV SOLN
INTRAVENOUS | Status: DC
  Administered 2014-06-02: 20 mL/h via INTRAVENOUS
  Filled 2014-06-02 (×2): qty 1000

## 2014-06-02 MED ORDER — LIDOCAINE-EPINEPHRINE 1 %-1:100000 IJ SOLN
INTRAMUSCULAR | Status: DC | PRN
  Administered 2014-06-02: 10 mL

## 2014-06-02 MED ORDER — FUROSEMIDE 10 MG/ML IJ SOLN
20.0000 mg | Freq: Four times a day (QID) | INTRAMUSCULAR | Status: DC
  Administered 2014-06-02 – 2014-06-03 (×4): 20 mg via INTRAVENOUS
  Filled 2014-06-02 (×4): qty 2

## 2014-06-02 MED ORDER — BACITRACIN ZINC 500 UNIT/GM EX OINT
1.0000 "application " | TOPICAL_OINTMENT | Freq: Three times a day (TID) | CUTANEOUS | Status: DC
  Administered 2014-06-02 – 2014-06-10 (×17): 1 via TOPICAL
  Filled 2014-06-02: qty 28.35

## 2014-06-02 MED ORDER — FENTANYL CITRATE (PF) 100 MCG/2ML IJ SOLN
INTRAMUSCULAR | Status: DC | PRN
  Administered 2014-06-02: 50 ug via INTRAVENOUS

## 2014-06-02 MED ORDER — MIDAZOLAM HCL 5 MG/5ML IJ SOLN
INTRAMUSCULAR | Status: DC | PRN
  Administered 2014-06-02: 2 mg via INTRAVENOUS

## 2014-06-02 MED ORDER — GLYCOPYRROLATE 0.2 MG/ML IJ SOLN
INTRAMUSCULAR | Status: DC | PRN
  Administered 2014-06-02: 0.2 mg via INTRAVENOUS

## 2014-06-02 MED ORDER — DEXTROSE 50 % IV SOLN
25.0000 mL | Freq: Once | INTRAVENOUS | Status: AC
  Administered 2014-06-02: 25 mL via INTRAVENOUS

## 2014-06-02 MED ORDER — POTASSIUM CHLORIDE 10 MEQ/100ML IV SOLN
10.0000 meq | INTRAVENOUS | Status: AC
  Administered 2014-06-02 (×6): 10 meq via INTRAVENOUS
  Filled 2014-06-02 (×6): qty 100

## 2014-06-02 MED ORDER — LIDOCAINE-EPINEPHRINE 1 %-1:100000 IJ SOLN
INTRAMUSCULAR | Status: AC
  Filled 2014-06-02: qty 1

## 2014-06-02 MED ORDER — FENTANYL CITRATE (PF) 100 MCG/2ML IJ SOLN
INTRAMUSCULAR | Status: AC
  Filled 2014-06-02: qty 2

## 2014-06-02 MED ORDER — DEXTROSE 50 % IV SOLN
INTRAVENOUS | Status: AC
  Administered 2014-06-02: 25 mL
  Filled 2014-06-02: qty 50

## 2014-06-02 SURGICAL SUPPLY — 38 items
ATTRACTOMAT 16X20 MAGNETIC DRP (DRAPES) ×3 IMPLANT
BAG SPEC THK2 15X12 ZIP CLS (MISCELLANEOUS) ×1
BAG ZIPLOCK 12X15 (MISCELLANEOUS) ×3 IMPLANT
BLADE SURG 15 STRL LF DISP TIS (BLADE) ×1 IMPLANT
BLADE SURG 15 STRL SS (BLADE) ×3
CORDS BIPOLAR (ELECTRODE) IMPLANT
COVER SURGICAL LIGHT HANDLE (MISCELLANEOUS) ×3 IMPLANT
ELECT COATED BLADE 2.86 ST (ELECTRODE) ×3 IMPLANT
ELECT REM PT RETURN 9FT ADLT (ELECTROSURGICAL) ×3
ELECTRODE REM PT RTRN 9FT ADLT (ELECTROSURGICAL) ×1 IMPLANT
GAUZE SPONGE 4X4 16PLY XRAY LF (GAUZE/BANDAGES/DRESSINGS) ×3 IMPLANT
GLOVE BIO SURGEON STRL SZ 6.5 (GLOVE) ×1 IMPLANT
GLOVE BIO SURGEONS STRL SZ 6.5 (GLOVE) ×1
GLOVE BIOGEL PI IND STRL 6.5 (GLOVE) IMPLANT
GLOVE BIOGEL PI INDICATOR 6.5 (GLOVE) ×2
GLOVE ECLIPSE 8.0 STRL XLNG CF (GLOVE) ×3 IMPLANT
GLOVE SURG SS PI 7.0 STRL IVOR (GLOVE) ×2 IMPLANT
GOWN STRL REUS W/ TWL LRG LVL4 (GOWN DISPOSABLE) IMPLANT
GOWN STRL REUS W/TWL LRG LVL4 (GOWN DISPOSABLE) ×3
GOWN STRL REUS W/TWL XL LVL3 (GOWN DISPOSABLE) ×3 IMPLANT
HOLDER TRACH TUBE VELCRO 19.5 (MISCELLANEOUS) ×3 IMPLANT
NDL HYPO 25X1 1.5 SAFETY (NEEDLE) ×1 IMPLANT
NEEDLE HYPO 25X1 1.5 SAFETY (NEEDLE) ×3 IMPLANT
NS IRRIG 1000ML POUR BTL (IV SOLUTION) ×3 IMPLANT
PACK EENT SPLIT (PACKS) ×3 IMPLANT
PENCIL BUTTON HOLSTER BLD 10FT (ELECTRODE) ×3 IMPLANT
SPONGE DRAIN TRACH 4X4 STRL 2S (GAUZE/BANDAGES/DRESSINGS) ×3 IMPLANT
SUT CHROMIC 2 0 SH (SUTURE) ×2 IMPLANT
SUT ETHILON 5 0 PS 2 18 (SUTURE) ×3 IMPLANT
SUT SILK 2 0 (SUTURE) ×3
SUT SILK 2-0 18XBRD TIE 12 (SUTURE) IMPLANT
SUT VIC AB 4-0 PS2 27 (SUTURE) ×3 IMPLANT
SYR CONTROL 10ML LL (SYRINGE) ×3 IMPLANT
SYRINGE 10CC LL (SYRINGE) ×3 IMPLANT
TOWEL OR 17X26 10 PK STRL BLUE (TOWEL DISPOSABLE) ×6 IMPLANT
TUBE TRACH SHILEY  6 DIST  CUF (TUBING) ×2 IMPLANT
WATER STERILE IRR 1500ML POUR (IV SOLUTION) ×3 IMPLANT
YANKAUER SUCT BULB TIP 10FT TU (MISCELLANEOUS) ×3 IMPLANT

## 2014-06-02 NOTE — Progress Notes (Signed)
PULMONARY / CRITICAL CARE MEDICINE   Name: [name] MRN: [id number] DOB: 08/22/1990    ADMISSION DATE:  05/08/2014 CONSULTATION DATE:  05/27/2014  REFERRING MD :  [name]  CHIEF COMPLAINT:  Resp Distress  INITIAL PRESENTATION: 24 y.o with long admission in 04/2014 for ARDS, tstomy decannulated 04/27/14, PEG in situ re admitted 4/17 for altered mental status, which improved. Intent was to remove PEG on 4/22. He developed progressive AKI. PCCM consulted for acute resp acidosis on 4/20 after swallow study ? Aspiration. PMH of club foot, hemiatrophy of RLE, Klippel Feil Syndrome, scoliosis s/p harrington rod surgery (2003), Restrictive lung physiology, Tourette's syndrome He is very functional & [facility], lives with mom  STUDIES:  4/18 CT chest/ abd/pelvis:  BLL atx/ pna 4/24 Echocardiogram (performed prior to bradycardic arrest): Moderate LVH, LVEF 30-35%, decreased from LVEF 50-55% 04/05/14  SIGNIFICANT EVENTS: 4/20 swallow eval >> failed 4/20 intubated for resp acidosis 4/23 Failed self extubation due to rapid and severe hypoxemia 4/24 bradycardic arrest triggered by severe agitation during mouth care with approx 5 mins chest compressions and 2 amps epi 4/25 Cardiology consult for LVEF 30-35%, started on bicarb gtt, ENT consulted for trach  SUBJECTIVE:  Quiet night, making urine, Cr improved  VITAL SIGNS: Temp:  [97.1 F (36.2 C)-98.6 F (37 C)] 97.9 F (36.6 C) (04/27 0800) Pulse Rate:  [67-126] 91 (04/27 0900) Resp:  [19-20] 20 (04/27 0900) BP: (120-169)/(66-93) 120/66 mmHg (04/27 0438) SpO2:  [86 %-100 %] 98 % (04/27 0900) FiO2 (%):  [30 %] 30 % (04/27 0900) Weight:  [75.5 kg (166 lb 7.2 oz)] 75.5 kg (166 lb 7.2 oz) (04/27 0403) HEMODYNAMICS:   VENTILATOR SETTINGS: Vent Mode:  [-] PRVC FiO2 (%):  [30 %] 30 % Set Rate:  [20 bmp] 20 bmp Vt Set:  [450 mL] 450 mL PEEP:  [5 cmH20] 5 cmH20 Plateau Pressure:  [38 cmH20-47 cmH20] 38 cmH20 INTAKE / OUTPUT:  Intake/Output  Summary (Last 24 hours) at 05/26/2014 0900 Last data filed at 05/31/2014 0843  Gross per 24 hour  Intake 3280.65 ml  Output   3115 ml  Net 165.65 ml    PHYSICAL EXAMINATION: Gen: sedated on vent HENT: NCAT, ETT in place, short neck, trach scar well healed PULM: CTA on R, diminished breath sounds on left CV: RRR, no clear mgr GI: BS+, soft, nontender Derm: no breakdown MSK: atrophy of bilateral legs, normal bulk/tone arms with pitting edema Neuro: Heavily sedated, opens eyes to voice, moves arms/hands spontaneously  LABS:  CBC  Recent Labs Lab 05/31/14 0545 06/01/14 0411 06/01/2014 0500  WBC 7.2 4.4 3.8*  HGB 11.6* 10.7* 11.0*  HCT 38.8* 34.5* 34.4*  PLT 223 227 233   Coag's No results for input(s): APTT, INR in the last 168 hours. BMET  Recent Labs Lab 05/31/14 1100 06/01/14 0411 05/07/2014 0500  NA 135 134* 138  K 3.7 3.0* 2.8*  CL 112 108 108  CO2 15* 20 23  BUN 23 22 21   CREATININE 7.43* 7.32* 6.69*  GLUCOSE 148* 148* 114*   Electrolytes  Recent Labs Lab 05/29/14 0445  05/31/14 1100 06/01/14 0411 05/11/2014 0500  CALCIUM 8.2*  < > 7.0* 6.8* 6.8*  PHOS 5.3*  --   --   --   --   < > = values in this interval not displayed. Sepsis Markers  Recent Labs Lab 05/31/14 0545  PROCALCITON 2.00   ABG  Recent Labs Lab 05/28/14 0328 05/31/14 1030 06/01/14 0900  PHART 7.317*  7.126* 7.345*  PCO2ART 38.6 53.9* 35.6  PO2ART 149.0* 130.0* 135*   Liver Enzymes  Recent Labs Lab 05/29/14 0445 06/01/2014 0500  AST 20 13  ALT 22 12  ALKPHOS 61 45  BILITOT 0.4 0.4  ALBUMIN 2.8* 2.3*   Cardiac Enzymes  Recent Labs Lab 05/31/14 1610 05/31/14 2200 06/01/14 0411  TROPONINI 0.18* 0.18* 0.17*   Glucose  Recent Labs Lab 06/01/14 1228 06/01/14 1547 06/01/14 2000 06/01/14 2038 06/01/14 2344 05/31/2014 0307  GLUCAP 115* 97 62* 89 71 65*    CXR: CM, low volumes, increasing opacities    ASSESSMENT / PLAN:  PULMONARY OETT 4/20 >>  A: Acute resp  failure, hypercarbic in setting of HCAP and renal failure > improving 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 Prior trach tube Wheezing> improved with inhaled steroid P:   Continue full vent support, start weaning post tracheostomy VAP prevention ENT to perform redo tracheostomy today 10:30 Continue nebulized steroids and BDs started 4/24  CARDIOVASCULAR R IJ CVL 4/20 >>  R femoral A-line 4/24 >>  A:  Persistent hypotension 4/23 > likely due to acidosis, resolved S/p bradycardic arrest 4/24 New finding of cardiomyopathy by Echo 4/24 > presumably due to acidosis P:  Check [place]ox Wean neo to off F/u cardiology recs  RENAL A:   AKI, nonoliguric presumably ATN secondary to IV contrast > improving Metabolic acidosis, mild > improved post bicarb gtt Anasarca due to volume administered Hypokalemia P:   D/C bicarb gtt F/u renal recs Lasix > per renal/cardiology Send BMET again this afternoon to f/u on AM K replacement Monitor UOP, electrolytes  GASTROINTESTINAL A:   Chronic G tube Elevated gastric residuals Constipation P:   SUP: enteral PPI Cont TFs PRN dulcolax and miralax 4/23  HEMATOLOGIC A:   No issues P:  DVT px: SQ heparin Monitor CBC intermittently Transfuse per usual ICU guidelines  INFECTIOUS A:   HCAP on admission, resolved P:   Monitor fever curve  ENDOCRINE A:   Hyperglycemia without prior dx of DM P:   Cont SSI initiated 4/23  NEUROLOGIC A:   Klippel Feil Syndrome ADD Severe agitation causing vent asynchrony and self extubation> reviewing chart shows that he had severe agitation in ICU 04/2014, had extrapyramidal reaction to Haldol, tolerated Depakote at that time  P:   RASS goal: -3 Stop precedex, ineffective  Continue fentanyl and versed gtt > wean post trach Holding adderall Continue depakote  FAMILY  - Updates: updated mother by phone 4/25  My CCM time: 35 mins  Heber Karlsruhe, MD [place] P[facility]M Pager: [phone] Cell:  [phone] If no response, call [phone]

## 2014-06-02 NOTE — Progress Notes (Signed)
NUTRITION FOLLOW UP  Intervention:   If medically able, and pt continues to have high residuals, consider addition of prokinetic agent such as Reglan.   When medically able, recommend resuming Vital AF 1.2 @ 40 ml/hr via peg tube and increase by 10 ml every 4 hours to goal rate of 60 ml/hr.  Tube feeding goal rate provides 1728 kcal (98% of needs), 108 grams of protein, and 1166 ml of H2O.   Nutrition Dx:   Inadequate oral intake related to inability to eat as evidenced by NPO; ongoing  Goal:   Pt to meet >/= 90% of their estimated nutrition needs; not met  Monitor:   TF initiation and tolerance, weight trend, vent status/settings, labs  Assessment:   24 y.o with long admission in 04/2014 for ARDS, tstomy decannulated 04/27/14, PEG in situ re admitted 4/17 for altered mental status, which improved. Intent was to remove PEG on 4/22. He developed progressive AKI. PCCM consulted for acute resp acidosis on 4/20 after swallow study ? aspiration PMH of club foot, hemiatrophy of RLE, Klippel Feil Syndrome, scoliosis s/p harrington rod surgery (2003), Restrictive lung disease, & Tourette's syndrome / Tic disorder  -Intubated 4/20 -Failed self-extubation 4/23 -Bradycardiac arrest triggered by severe agitation during mouth care with approx 5 mins chest compressions and 2 amps epi 4/24  - Pt with 13 lb wt loss in one month. (9%- significant for time frame) - No signs of fat or muscle wasting. - Suspect some form of malnutrition, but pt does not meet criteria at this time.   4/26: - Pt scheduled for trach placement tomorrow 4/27 - Pt with high residuals (~550 mL), per RN TF to be held until 1 pm. Pt with high residuals over past 1-2 days of 300-500 mL.  - 2 bm last night  4/27: - Pt in OR for trach placement during RD visit.  - Per RN, residuals improved last night. TF held after midnight for trach today.  - Will start TF at 40 ml/hr and increase to goal .  - Labs and medications reviewed.    K 2.8  Albumin 2.3 - RD to follow-up tomorrow.   Height: Ht Readings from Last 1 Encounters:  05/29/14 _0  (1.499 m)    Weight Status:   Wt Readings from Last 1 Encounters:  05/13/2014 166 lb 7.2 oz (75.5 kg)    Re-estimated needs (4/26):  Kcal: 1638 Protein: 90-105 g Fluid: 1.7 L/day  Skin: intact  Diet Order:     Intake/Output Summary (Last 24 hours) at 05/26/2014 1355 Last data filed at 05/26/2014 1300  Gross per 24 hour  Intake 3779.65 ml  Output   3080 ml  Net 699.65 ml    Last BM: 4/26   Labs:   Recent Labs Lab 05/29/14 0445  05/31/14 1100 06/01/14 0411 05/17/2014 0500  NA 147*  < > 135 134* 138  K 4.2  < > 3.7 3.0* 2.8*  CL 119*  < > 112 108 108  CO2 21  < > 15* 20 23  BUN 21  < > _1 CREATININE 7.06*  < > 7.43* 7.32* 6.69*  CALCIUM 8.2*  < > 7.0* 6.8* 6.8*  PHOS 5.3*  --   --   --   --   GLUCOSE 209*  < > 148* 148* 114*  < > = values in this interval not displayed.  CBG (last 3)   Recent Labs  06/01/14 2038 06/01/14 2344 05/28/2014 0307  GLUCAP 89  71 65*    Scheduled Meds: . antiseptic oral rinse  7 mL Mouth Rinse QID  . bacitracin  1 application Topical 3 times per day  . budesonide  0.25 mg Nebulization 4 times per day  . chlorhexidine  15 mL Mouth Rinse BID  . feeding supplement (VITAL AF 1.2 CAL)  1,000 mL Per Tube Q24H  . free water  200 mL Per Tube Q6H  . furosemide  20 mg Intravenous 4 times per day  . heparin subcutaneous  5,000 Units Subcutaneous 3 times per day  . insulin aspart  0-9 Units Subcutaneous 6 times per day  . ipratropium-albuterol  3 mL Nebulization Q6H  . pantoprazole sodium  40 mg Per Tube Q1200  . potassium chloride  10 mEq Intravenous Q1 Hr x 6  . sodium chloride  3 mL Intravenous Q12H  . valproate sodium  1,000 mg Intravenous Q12H    Continuous Infusions: . dextrose 20 mL/hr (05/11/2014 0343)  . fentaNYL infusion INTRAVENOUS 400 mcg/hr (05/19/2014 1300)  . midazolam (VERSED) infusion 5 mg/hr  (05/15/2014 1300)  . norepinephrine (LEVOPHED) Adult infusion Stopped (05/31/14 0845)    [name] [place], [place], [place]

## 2014-06-02 NOTE — Care Management Note (Signed)
  Page 2 of 2   06/10/2014     12:43:20 PM CARE MANAGEMENT NOTE 06/10/2014  Patient:  Paulo [name],Aaron   Account Number:  [id number]  Date Initiated:  05/24/2014  Documentation initiated by:  Trinna BalloonMcGIBBONEY,COOKIE MYRAETTE  Subjective/Objective Assessment:   Pt admitted with cco sepsis     Action/Plan:   from home with mother   Anticipated DC Date:  06/10/2014   Anticipated DC Plan:  SKILLED NURSING FACILITY  In-house referral  Clinical Social Worker      DC Planning Services  CM consult      Choice offered to / List presented to:             Status of service:  In process, will continue to follow Medicare Important Message given?   (If response is "NO", the following Medicare IM given date fields will be blank) Date Medicare IM given:   Medicare IM given by:   Date Additional Medicare IM given:   Additional Medicare IM given by:    Discharge Disposition:    Per UR Regulation:  Reviewed for med. necessity/level of care/duration of stay  If discussed at Long Length of Stay Meetings, dates discussed:    Comments:  Date:  Jun 10, 2014 U.R. performed for needs and level of care. Will continue to follow for Case Management needs.  Marcelle Smilinghonda Michal Callicott, RN, BSN, CCM   [phone] [employer] on [id number] due to disconnection from vent. remains on full vent support.  Unable to wean.  Jun 07, 2014/Debbi Strandberg L. Earlene Plateravis, RN, BSN, CCM. Case Management [facility] [phone] No discharge needs present of time of review. remains sedated  June 02, 2014/Karrisa Didio L. Earlene Plateravis, RN, BSN, CCM. Case Management [facility] [phone] No discharge needs present of time of review. Tracheostomy performed on 06/01/2014/remains on full vent support.  May 30, 2014/Jadarrius Maselli L. Earlene Plateravis, RN, BSN, CCM. Case Management [facility] [phone] No discharge needs present of time of review. iv fentynl drip and intubated  May 27, 2014/Amylee Lodato L. Earlene Plateravis, RN, BSN, CCM. Case Management [facility] [phone] No discharge needs present of time of review. Respfailure with aki transferred to icu on [id number] and required intubation.  05/25/2014 MMcGibboney, RN, BSN Chart reviewed.

## 2014-06-02 NOTE — Progress Notes (Signed)
Hypoglycemic Event  CBG: 63 @ 1252  Treatment: D50 IV 25 mL  Symptoms: None  Follow-up CBG: Time: 1321  CBG Result: 130  Possible Reasons for Event: Inadequate meal intake   Comments/MD notified: Tube feeding stopped for trach procedure today.   Shiflett, Angelina SheriffJamie M

## 2014-06-02 NOTE — Progress Notes (Signed)
eLink Physician-Brief Progress Note Patient Name: [name] DOB: 09/14/1990 MRN: [id number]   Date of Service  05/10/2014  HPI/Events of Note  Hypoglycemic.  eICU Interventions  Will start D10 at 20 ml/hr.     Intervention Category Major Interventions: Other:  Asberry Lascola 06/01/2014, 3:25 AM

## 2014-06-02 NOTE — Op Note (Signed)
05/13/2014  12:08 PM    [name], [place]  [id number]   Pre-Op  Dx:  Prolonged intubation. Klippel-Feil syndrome.  Post-op Dx: Same  Proc: Tracheostomy, flexible tracheal bronchoscopy per tracheostomy   Surg:  Flo ShanksWOLICKI, Lacara Dunsworth T MD  Anes:  GOT  EBL:  Minimal  Comp:  None  Findings: Fairly dense scar tissue at the anterior tracheal wall consistent with prior tracheostomy 2.  #6 cuffed Shiley tracheostomy tube admitted easily. Per flexible bronchoscopy, the tube tip at least 4-5 centimeters above the carina.  Procedure: The patient was transferred from the ICU intubated. He was transferred to an operating table. A shoulder roll was placed, the neck was extended and the head supported in the standard fashion.  The lower neck was palpated with the findings as described above. 1% Xylocaine with 1-100,000 epinephrine, 10 mL's total was infiltrated into the surgical field for intraoperative hemostasis.  The internal jugular central line was resecured with 2-0 silk sutures away from the trach tie location.  A sterile preparation and draping of the low anterior neck was performed in the standard fashion.  A 3 cm sharp transverse incision was made overlying the dimple from the previous tracheostomy. The scarred skin was elevated from the deeper tissues. Trachea was palpable.  The anterior face of the trachea was cleaned sharply and using the cautery. Hemostasis was observed.  A transverse incision into the trachea was made at an indeterminate level but there was very heavy cartilaginous scar tissue in the airway was not entered. Moving down approximately 12 mm, a transverse incision was made into the airway. The cut mucosal edges were coagulated for hemostasis. The opening was expanded with a trach spreader. A 2-0 chromic stitch was placed into the soft tissues immediately anterior to the trachea and secured to the lower wound edge.  A previously prepared as tested #6 cuffed Shiley tracheostomy tube  was inserted without difficulty.  Again hemostasis was observed. The cuff was inflated and observed to be intact. CO2 return was noted. Chest wall motion was minimal.  The flexible laryngoscope was introduced through the tracheostomy tube and the tube was observed to be within the tracheal lumen and the tube tip 4-5 centimeters above the carina. The scope was removed.  The tube was tied in the standard fashion using cotton twill tape. The oral cavity was suctioned after removing the orotracheal tube. The trachea was suctioned for a small amount of pink secretions.  At this point the procedure was completed. The patient was returned to anesthesia, and transferred back to the medical intensive care unit in stable condition.  Dispo:   OR to ICU  Plan:  No Velcro ties for 7 days, especially given his predilection for tugging at his tubes etc. Routine trach hygiene. When he is off the ventilator, we could switch to a cuffless tube.  Given his rapid relapse after previous decannulation, I would strongly advise against decannulating him. We could move to a smaller tube and use a [employer] valve for communication.  Cephus RicherWOLICKI,  Lajune Perine T MD

## 2014-06-02 NOTE — Transfer of Care (Signed)
Immediate Anesthesia Transfer of Care Note  Patient: [name]  Procedure(s) Performed: Procedure(s): TRACHEOSTOMY (N/A)  Patient Location: ICU  Anesthesia Type:General  Level of Consciousness: sedated and unresponsive  Airway & Oxygen Therapy: Patient placed on Ventilator (see vital sign flow sheet for setting)  Post-op Assessment: Report given to RN  Post vital signs: stable  Last Vitals:  Filed Vitals:   05/25/2014 1000  BP:   Pulse: 86  Temp:   Resp: 20    Complications: No apparent anesthesia complications

## 2014-06-02 NOTE — Progress Notes (Signed)
Patient ID: [name], male   DOB: 07/21/1990, 24 y.o.   MRN: [id number]   [name] Progress Note   Assessment/ Plan:   1. AKI: Data/timeline of events is suggestive of contrast-induced nephropathy versus ischemic ATN. Nonoliguric and with improving renal function-agree with continuing furosemide to help mobilize some of the interstitial edema. Agree with discontinuation of sodium bicarbonate drip at this time due to problems with hypokalemia. 2. Ventilatory dependent respiratory failure/structural lung disease: With CO2 retention and adjustments made to ventilator by critical care. Status post tracheostomy tube. 3. Hypokalemia: With limited potassium intake and likely intracellular shifts with sodium bicarbonate gtt--replaced via enteral route 4. Mixed gap/non-gap Metabolic acidosis: Likely associated with acute renal failure: monitor to determine need for Oral bicarbonate supplement.   Subjective:   No acute events overnight-tracheostomy placed this morning    Objective:   BP 120/66 mmHg  Pulse 93  Temp(Src) 96.6 F (35.9 C) (Core (Comment))  Resp 20  Ht 4\' 11"  (1.499 m)  Wt 75.5 kg (166 lb 7.2 oz)  BMI 33.60 kg/m2  SpO2 100%  Intake/Output Summary (Last 24 hours) at 05/08/2014 1254 Last data filed at 05/18/2014 1228  Gross per 24 hour  Intake 3868.65 ml  Output   2830 ml  Net 1038.65 ml   Weight change:   Physical Exam: PPI:[id number]:comfortable on ventilator via tracheostomy CVS: Pulse regular in rate and rhythm, S1 and S2 normal  Resp: Coarse bilaterally, no rales/rhonchi  Abd: Soft, flat, nontender  Ext: Dependent edema appreciated-right lower extremity hemiatrophy   Imaging: Dg Chest Port 1 View  06/01/2014   CLINICAL DATA:  Acute respiratory failure with hypoxia  EXAM: PORTABLE CHEST - 1 VIEW  COMPARISON:  05/31/2014  FINDINGS: As permitted by overlapping hardware, the endotracheal tube tip is above the carina and below the clavicular heads. A right IJ  central line is in similar position, tip near the SVC origin.  There is chronic cardiomegaly. There are indistinct bibasilar lung opacities, obscuring the left diaphragm. No evidence for edema or pneumothorax. Dysmorphic ribs and scoliosis fixation.  IMPRESSION: 1. Persistent lower lobe atelectasis or pneumonia, greater on the left. 2. Endotracheal tube remains in good position.   Electronically Signed   By: Marnee SpringJonathon  Watts M.D.   On: 06/01/2014 07:38    Labs: BMET  Recent Labs Lab 05/28/14 1030 05/29/14 0445 05/30/14 0603 05/30/14 1730 05/31/14 1100 06/01/14 0411 05/09/2014 0500  NA 146* 147* 147* 145 135 134* 138  K 3.8 4.2 4.3 4.0 3.7 3.0* 2.8*  CL 119* 119* 119* 117* 112 108 108  CO2 21 21 19  18* 15* 20 23  GLUCOSE 72 209* 167* 160* 148* 148* 114*  BUN 18 21 24* 23 23 22 21   CREATININE 6.72* 7.06* 7.58* 7.19* 7.43* 7.32* 6.69*  CALCIUM 7.5* 8.2* 7.7* 7.5* 7.0* 6.8* 6.8*  PHOS  --  5.3*  --   --   --   --   --    CBC  Recent Labs Lab 05/29/14 0445 05/31/14 0545 06/01/14 0411 05/19/2014 0500  WBC 9.5 7.2 4.4 3.8*  NEUTROABS  --   --  2.6 2.1  HGB 12.2* 11.6* 10.7* 11.0*  HCT 39.9 38.8* 34.5* 34.4*  MCV 96.4 100.0 94.5 93.0  PLT 232 223 227 233    Medications:    . antiseptic oral rinse  7 mL Mouth Rinse QID  . bacitracin  1 application Topical 3 times per day  . budesonide  0.25 mg Nebulization 4 times  per day  . chlorhexidine  15 mL Mouth Rinse BID  . feeding supplement (VITAL AF 1.2 CAL)  1,000 mL Per Tube Q24H  . free water  200 mL Per Tube Q6H  . furosemide  20 mg Intravenous 4 times per day  . heparin subcutaneous  5,000 Units Subcutaneous 3 times per day  . insulin aspart  0-9 Units Subcutaneous 6 times per day  . ipratropium-albuterol  3 mL Nebulization Q6H  . pantoprazole sodium  40 mg Per Tube Q1200  . potassium chloride  10 mEq Intravenous Q1 Hr x 6  . sodium chloride  3 mL Intravenous Q12H  . valproate sodium  1,000 mg Intravenous Q12H   Zetta Bills,  MD 05/19/2014, 12:54 PM

## 2014-06-02 NOTE — Progress Notes (Signed)
Patient Name: [name] Date of Encounter: 05/12/2014  Active Problems:   Attention deficit disorder   Tics of organic origin   Acute encephalopathy   Tourettes syndrome   Sepsis   Abdominal pain   Aspiration pneumonia   Atelectasis of left lung   Length of Stay: 10  SUBJECTIVE  The patient is intubated and sedated.  CURRENT MEDS . antiseptic oral rinse  7 mL Mouth Rinse QID  . budesonide  0.25 mg Nebulization 4 times per day  . chlorhexidine  15 mL Mouth Rinse BID  . feeding supplement (VITAL AF 1.2 CAL)  1,000 mL Per Tube Q24H  . free water  200 mL Per Tube Q6H  . heparin subcutaneous  5,000 Units Subcutaneous 3 times per day  . insulin aspart  0-9 Units Subcutaneous 6 times per day  . ipratropium-albuterol  3 mL Nebulization Q6H  . pantoprazole sodium  40 mg Per Tube Q1200  . sodium chloride  3 mL Intravenous Q12H  . valproate sodium  1,000 mg Intravenous Q12H   . dextrose 20 mL/hr (06/05/2014 0343)  . fentaNYL infusion INTRAVENOUS 200 mcg/hr (05/19/2014 0800)  . midazolam (VERSED) infusion 2.5 mg/hr (05/10/2014 0800)  . norepinephrine (LEVOPHED) Adult infusion Stopped (05/31/14 0845)  .  sodium bicarbonate  infusion 1000 mL 75 mL/hr at 05/19/2014 0418    OBJECTIVE  Filed Vitals:   05/12/2014 0500 05/15/2014 0600 05/25/2014 0700 05/14/2014 0800  BP:      Pulse: 79 86 77 87  Temp:      TempSrc:      Resp: Height:      Weight:      SpO2: 100% 100% 100% 100%    Intake/Output Summary (Last 24 hours) at 05/30/2014 0809 Last data filed at 05/29/2014 0800  Gross per 24 hour  Intake 3299.4 ml  Output   3115 ml  Net  184.4 ml   Filed Weights   05/29/14 0500 05/31/14 0630 05/25/2014 0403  Weight: 147 lb 4.3 oz (66.8 kg) 160 lb 11.5 oz (72.9 kg) 166 lb 7.2 oz (75.5 kg)    PHYSICAL EXAM  General: Intubated, sedated Psych: Normal affect. HEENT:  Normal  Neck: short neck, difficult JVD evaluation Lungs:  Resp regular and unlabored, rales B/L Heart: RRR no  s3, s4, or murmurs. Abdomen: Soft, non-tender, non-distended, BS + x 4.  Extremities: club feet, mild edema  Accessory Clinical Findings  CBC  Recent Labs  06/01/14 0411 06/01/2014 0500  WBC 4.4 3.8*  NEUTROABS 2.6 2.1  HGB 10.7* 11.0*  HCT 34.5* 34.4*  MCV 94.5 93.0  PLT 227 233   Basic Metabolic Panel  Recent Labs  06/01/14 0411 05/20/2014 0500  NA 134* 138  K 3.0* 2.8*  CL 108 108  CO2 20 23  GLUCOSE 148* 114*  BUN 22 21  CREATININE 7.32* 6.69*  CALCIUM 6.8* 6.8*    Recent Labs  05/31/14 1610 05/31/14 2200 06/01/14 0411  TROPONINI 0.18* 0.18* 0.17*   Radiology/Studies  Dg Chest Port 1 View  06/01/2014   CLINICAL DATA:  Acute respiratory failure with hypoxia  EXAM: PORTABLE CHEST - 1 VIEW  COMPARISON:  05/31/2014  FINDINGS: As permitted by overlapping hardware, the endotracheal tube tip is above the carina and below the clavicular heads. A right IJ central line is in similar position, tip near the SVC origin.  There is chronic cardiomegaly. There are indistinct bibasilar lung opacities, obscuring the left diaphragm. No evidence for  edema or pneumothorax. Dysmorphic ribs and scoliosis fixation.  IMPRESSION: 1. Persistent lower lobe atelectasis or pneumonia, greater on the left. 2. Endotracheal tube remains in good position.    TELE:  SR to ST, no bradycardia    ASSESSMENT AND PLAN  1. Transient bradycardia in the setting of aspiration 2. 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 Recurrent respiratory failure- intubated 4. 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 HCAP  [name] is a 24 year old functional gentleman with a history of club foot, hemiatrophy of RLE, Klippel Feil Syndrome, scoliosis s/p harrington rod surgery (2003), restrictive lung physiology, and Tourette's syndrome. He is status post a prior admission with acute respiratory failure secondary to pneumonia and had a complicated  hospital course with ARDS, delirium, acute kidney injury, and he required prolonged ventilatory support and underwent tracheostomy. Following discharge, he was readmitted on 05/28/2014 with worsening mental status and respiratory failure requiring reintubation. He has developed worsening renal function and hypotension. Yesterday at 10:19 AM while being suctioned and getting mouth care he developed significant bradycardia with heart rates dropping to 39 bpm, with apparrent sinus bradycardia. Due to posible possible PEA transient CPR was administered. Heart rate ultimately improved. He has not had any further significant bradycardia arrhythmia since. Question if initial episode mediated by a vagal response to suctioning and mouth care.  An echo Doppler study done yesterday showed moderate left ventricular hypertrophy with moderately severe new LV dysfunction with an ejection fraction of 30-35% with diffuse hypokinesis. Presently, the patient is intubated and sedated on the ventilator. His ECG shows sinus rhythm at 82 bpm. He has diffuse T-wave abnormalities in leads 1 and L, V2 through V6, and QT interval is prolonged at 509 ms. His current blood pressure is soft, he has developed acute renal failure post contrast administration, he is recovering his renal output. Based on weights, he has gained 29 lbs in the last 5 days.  I believe that his heart failure is acute (normal LVEF in 03/2014) in the settings of metabolic acidosis, acute kidney failure, his troponin elevation is flat and most probably sec to CHF and acute renal failure.  No ACEI/ARB, once his BP improves we will start BiDil.   He continues to receive a lot of fluids in iv form - ATB, bicarb, he responded well to 1 dose of diuretics yesterday, I would increase today to 20 mg iv Q6H. His Crea is downtrending still severely elevated. I will replace potassium.   No ACEI/ARB, once his BP improves we will start BiDil.   Signed, Lars MassonNELSON,  Dejai Schubach H MD, [facility] IncFACC 2014/03/22

## 2014-06-02 NOTE — Anesthesia Preprocedure Evaluation (Signed)
Anesthesia Evaluation  Patient identified by MRN, date of birth, ID band  Reviewed: Allergy & Precautions, NPO status , Patient's Chart, lab work & pertinent test results, Unable to perform ROS - Chart review only  Airway Mallampati: Intubated       Dental   Pulmonary neg pulmonary ROS,  resp failure     + intubated    Cardiovascular negative cardio ROS  Rhythm:Regular Rate:Normal     Neuro/Psych Mild MR Klippel Feil Syndrome Tourette's syndrome   negative psych ROS   GI/Hepatic negative GI ROS, Neg liver ROS,   Endo/Other  negative endocrine ROS  Renal/GU CRFRenal disease  negative genitourinary   Musculoskeletal Club foot   Abdominal   Peds negative pediatric ROS (+)  Hematology negative hematology ROS (+)   Anesthesia Other Findings   Reproductive/Obstetrics negative OB ROS                             Anesthesia Physical Anesthesia Plan  ASA: III  Anesthesia Plan: General   Post-op Pain Management:    Induction: Intravenous  Airway Management Planned: Oral ETT and Tracheostomy  Additional Equipment:   Intra-op Plan:   Post-operative Plan: Possible Post-op intubation/ventilation  Informed Consent: I have reviewed the patients History and Physical, chart, labs and discussed the procedure including the risks, benefits and alternatives for the proposed anesthesia with the patient or authorized representative who has indicated his/her understanding and acceptance.   Dental advisory given  Plan Discussed with: CRNA  Anesthesia Plan Comments:         Anesthesia Quick Evaluation

## 2014-06-02 NOTE — Anesthesia Postprocedure Evaluation (Signed)
  Anesthesia Post-op Note  Patient: [name]  Procedure(s) Performed: Procedure(s) (LRB): TRACHEOSTOMY (N/A)  Patient Location: PACU  Anesthesia Type: General  Level of Consciousness: awake and alert   Airway and Oxygen Therapy: Patient Spontanous Breathing  Post-op Pain: mild  Post-op Assessment: Post-op Vital signs reviewed, Patient's Cardiovascular Status Stable, Respiratory Function Stable, Patent Airway and No signs of Nausea or vomiting  Last Vitals:  Filed Vitals:   05/25/2014 1400  BP:   Pulse: 102  Temp:   Resp: 12    Post-op Vital Signs: stable   Complications: No apparent anesthesia complications

## 2014-06-03 ENCOUNTER — Inpatient Hospital Stay (HOSPITAL_COMMUNITY): Payer: Medicaid Other

## 2014-06-03 ENCOUNTER — Encounter (HOSPITAL_COMMUNITY): Payer: Self-pay | Admitting: Otolaryngology

## 2014-06-03 DIAGNOSIS — R7989 Other specified abnormal findings of blood chemistry: Secondary | ICD-10-CM

## 2014-06-03 LAB — VALPROIC ACID LEVEL: VALPROIC ACID LVL: 56.7 ug/mL (ref 50.0–100.0)

## 2014-06-03 LAB — CBC WITH DIFFERENTIAL/PLATELET
Basophils Absolute: 0 10*3/uL (ref 0.0–0.1)
Basophils Relative: 0 % (ref 0–1)
EOS PCT: 5 % (ref 0–5)
Eosinophils Absolute: 0.3 10*3/uL (ref 0.0–0.7)
HEMATOCRIT: 35.8 % — AB (ref 39.0–52.0)
Hemoglobin: 11 g/dL — ABNORMAL LOW (ref 13.0–17.0)
LYMPHS PCT: 14 % (ref 12–46)
Lymphs Abs: 0.7 10*3/uL (ref 0.7–4.0)
MCH: 28.6 pg (ref 26.0–34.0)
MCHC: 30.7 g/dL (ref 30.0–36.0)
MCV: 93 fL (ref 78.0–100.0)
Monocytes Absolute: 0.9 10*3/uL (ref 0.1–1.0)
Monocytes Relative: 17 % — ABNORMAL HIGH (ref 3–12)
Neutro Abs: 3.3 10*3/uL (ref 1.7–7.7)
Neutrophils Relative %: 64 % (ref 43–77)
Platelets: 259 10*3/uL (ref 150–400)
RBC: 3.85 MIL/uL — AB (ref 4.22–5.81)
RDW: 14.3 % (ref 11.5–15.5)
WBC: 5.2 10*3/uL (ref 4.0–10.5)

## 2014-06-03 LAB — BASIC METABOLIC PANEL
ANION GAP: 12 (ref 5–15)
BUN: 25 mg/dL — ABNORMAL HIGH (ref 6–23)
CO2: 24 mmol/L (ref 19–32)
CREATININE: 6.83 mg/dL — AB (ref 0.50–1.35)
Calcium: 7.3 mg/dL — ABNORMAL LOW (ref 8.4–10.5)
Chloride: 105 mmol/L (ref 96–112)
GFR calc Af Amer: 12 mL/min — ABNORMAL LOW (ref >90)
GFR calc non Af Amer: 10 mL/min — ABNORMAL LOW (ref >90)
Glucose, Bld: 95 mg/dL (ref 70–99)
Potassium: 3.6 mmol/L (ref 3.5–5.1)
Sodium: 141 mmol/L (ref 135–145)

## 2014-06-03 LAB — GLUCOSE, CAPILLARY
GLUCOSE-CAPILLARY: 135 mg/dL — AB (ref 70–99)
GLUCOSE-CAPILLARY: 117 mg/dL — AB (ref 70–99)
Glucose-Capillary: 101 mg/dL — ABNORMAL HIGH (ref 70–99)
Glucose-Capillary: 89 mg/dL (ref 70–99)
Glucose-Capillary: 91 mg/dL (ref 70–99)

## 2014-06-03 MED ORDER — FUROSEMIDE 10 MG/ML IJ SOLN
40.0000 mg | Freq: Four times a day (QID) | INTRAMUSCULAR | Status: DC
  Administered 2014-06-03 – 2014-06-04 (×4): 40 mg via INTRAVENOUS
  Filled 2014-06-03 (×4): qty 4

## 2014-06-03 MED ORDER — DIPHENHYDRAMINE HCL 12.5 MG/5ML PO ELIX
25.0000 mg | ORAL_SOLUTION | Freq: Four times a day (QID) | ORAL | Status: DC | PRN
  Administered 2014-06-03: 25 mg via ORAL
  Filled 2014-06-03: qty 10

## 2014-06-03 NOTE — Progress Notes (Addendum)
NUTRITION FOLLOW UP  Intervention:   Continue Vital AF 1.2 @ 60 ml/hr via PEG  Tube feeding goal rate provides 1728 kcal (103% of needs), 108 grams of protein, and 1166 ml of H2O.   Nutrition Dx:   Inadequate oral intake related to inability to eat as evidenced by NPO; ongoing  Goal:   Pt to meet >/= 90% of their estimated nutrition needs; not met  Monitor:   TF initiation and tolerance, weight trend, vent status/settings, labs  Assessment:   24 y.o with long admission in 04/2014 for ARDS, tstomy decannulated 04/27/14, PEG in situ re admitted 4/17 for altered mental status, which improved. Intent was to remove PEG on 4/22. He developed progressive AKI. PCCM consulted for acute resp acidosis on 4/20 after swallow study ? aspiration PMH of club foot, hemiatrophy of RLE, Klippel Feil Syndrome, scoliosis s/p harrington rod surgery (2003), Restrictive lung disease, & Tourette's syndrome / Tic disorder  -Intubated 4/20 -Failed self-extubation 4/23 -Bradycardiac arrest triggered by severe agitation during mouth care with approx 5 mins chest compressions and 2 amps epi 4/24  - Pt with 13 lb wt loss in one month. (9%- significant for time frame) - No signs of fat or muscle wasting. - Suspect some form of malnutrition, but pt does not meet criteria at this time.    4/28: - Pt s/p trach placement 4/27.  - Tolerating TF at goal rate with 200 mL residuals.  - Per MD note, pt to start weaning today.  - Wt has increased 32 lbs in the past 6 days. +fl balance.   Patient is currently intubated on ventilator support MV: 8.9 L/min Temp (24hrs), Avg:98.7 F (37.1 C), Min:97.2 F (36.2 C), Max:99.1 F (37.3 C)  Propofol: none  Labs and medications reviewed  Height: Ht Readings from Last 1 Encounters:  05/29/14 _0  (1.499 m)    Weight Status:   Wt Readings from Last 1 Encounters:  06/03/14 170 lb 10.2 oz (77.4 kg)    Re-estimated needs (4/26):  Kcal: 1684 Protein: 90-105  g Fluid: 1.7 L/day  Skin: closed incision on neck  Diet Order:     Intake/Output Summary (Last 24 hours) at 06/03/14 1258 Last data filed at 06/03/14 1200  Gross per 24 hour  Intake 3140.7 ml  Output   4475 ml  Net -1334.3 ml    Last BM: 4/26   Labs:   Recent Labs Lab 05/29/14 0445  05/08/2014 0500 06/03/2014 1634 06/03/14 0500  NA 147*  < > 138 142 141  K 4.2  < > 2.8* 3.6 3.6  CL 119*  < > 108 108 105  CO2 21  < > _1 BUN 21  < > 21 22 25*  CREATININE 7.06*  < > 6.69* 6.92* 6.83*  CALCIUM 8.2*  < > 6.8* 6.9* 7.3*  PHOS 5.3*  --   --   --   --   GLUCOSE 209*  < > 114* 79 95  < > = values in this interval not displayed.  CBG (last 3)   Recent Labs  06/03/14 0327 06/03/14 0749 06/03/14 1157  GLUCAP 89 101* 135*    Scheduled Meds: . antiseptic oral rinse  7 mL Mouth Rinse QID  . bacitracin  1 application Topical 3 times per day  . budesonide  0.25 mg Nebulization 4 times per day  . chlorhexidine  15 mL Mouth Rinse BID  . feeding supplement (VITAL AF 1.2 CAL)  1,000 mL Per  Tube Q24H  . free water  200 mL Per Tube Q6H  . furosemide  40 mg Intravenous 4 times per day  . heparin subcutaneous  5,000 Units Subcutaneous 3 times per day  . insulin aspart  0-9 Units Subcutaneous 6 times per day  . ipratropium-albuterol  3 mL Nebulization Q6H  . pantoprazole sodium  40 mg Per Tube Q1200  . sodium chloride  3 mL Intravenous Q12H  . valproate sodium  1,000 mg Intravenous Q12H    Continuous Infusions: . dextrose 20 mL/hr (05/14/2014 0343)  . fentaNYL infusion INTRAVENOUS 100 mcg/hr (06/03/14 1200)  . norepinephrine (LEVOPHED) Adult infusion Stopped (05/31/14 0845)    [name] [place], [place], [place]

## 2014-06-03 NOTE — Progress Notes (Signed)
Patient Name: [name] Date of Encounter: 06/03/2014  Active Problems:   Attention deficit disorder   Tics of organic origin   Acute encephalopathy   Tourettes syndrome   Sepsis   Abdominal pain   Aspiration pneumonia   Atelectasis of left lung   AKI (acute kidney injury)   Hypokalemia   Length of Stay: 11  SUBJECTIVE  The patient is intubated and sedated.  CURRENT MEDS . antiseptic oral rinse  7 mL Mouth Rinse QID  . bacitracin  1 application Topical 3 times per day  . budesonide  0.25 mg Nebulization 4 times per day  . chlorhexidine  15 mL Mouth Rinse BID  . feeding supplement (VITAL AF 1.2 CAL)  1,000 mL Per Tube Q24H  . free water  200 mL Per Tube Q6H  . furosemide  20 mg Intravenous 4 times per day  . heparin subcutaneous  5,000 Units Subcutaneous 3 times per day  . insulin aspart  0-9 Units Subcutaneous 6 times per day  . ipratropium-albuterol  3 mL Nebulization Q6H  . pantoprazole sodium  40 mg Per Tube Q1200  . sodium chloride  3 mL Intravenous Q12H  . valproate sodium  1,000 mg Intravenous Q12H   . dextrose 20 mL/hr (05/13/2014 0343)  . fentaNYL infusion INTRAVENOUS 400 mcg/hr (06/03/14 0700)  . midazolam (VERSED) infusion 5 mg/hr (06/03/14 0700)  . norepinephrine (LEVOPHED) Adult infusion Stopped (05/31/14 0845)    OBJECTIVE  Filed Vitals:   06/03/14 0408 06/03/14 0529 06/03/14 0530 06/03/14 0700  BP: 150/73     Pulse: 100  98 91  Temp: 98.8 F (37.1 C)  99.1 F (37.3 C) 98.6 F (37 C)  TempSrc:    Core (Comment)  Resp: Height:      Weight:  170 lb 10.2 oz (77.4 kg)    SpO2: 97%  100% 100%    Intake/Output Summary (Last 24 hours) at 06/03/14 0747 Last data filed at 06/03/14 0730  Gross per 24 hour  Intake 3941.25 ml  Output   3650 ml  Net 291.25 ml   Filed Weights   05/31/14 0630 05/13/2014 0403 06/03/14 0529  Weight: 160 lb 11.5 oz (72.9 kg) 166 lb 7.2 oz (75.5 kg) 170 lb 10.2 oz (77.4 kg)    PHYSICAL EXAM  General:  Intubated, sedated Psych: Normal affect. HEENT:  Normal  Neck: short neck, difficult JVD evaluation Lungs:  Resp regular and unlabored, rales B/L Heart: RRR no s3, s4, or murmurs. Abdomen: Soft, non-tender, non-distended, BS + x 4.  Extremities: club feet, mild edema  Accessory Clinical Findings  CBC  Recent Labs  05/29/2014 0500 06/03/14 0500  WBC 3.8* 5.2  NEUTROABS 2.1 3.3  HGB 11.0* 11.0*  HCT 34.4* 35.8*  MCV 93.0 93.0  PLT 233 259   Basic Metabolic Panel  Recent Labs  06/01/2014 1634 06/03/14 0500  NA 142 141  K 3.6 3.6  CL 108 105  CO2 25 24  GLUCOSE 79 95  BUN 22 25*  CREATININE 6.92* 6.83*  CALCIUM 6.9* 7.3*    Recent Labs  05/31/14 1610 05/31/14 2200 06/01/14 0411  TROPONINI 0.18* 0.18* 0.17*   Radiology/Studies  Dg Chest Port 1 View  06/01/2014   CLINICAL DATA:  Acute respiratory failure with hypoxia  EXAM: PORTABLE CHEST - 1 VIEW  COMPARISON:  05/31/2014  FINDINGS: As permitted by overlapping hardware, the endotracheal tube tip is above the carina and below the clavicular heads. A  right IJ central line is in similar position, tip near the SVC origin.  There is chronic cardiomegaly. There are indistinct bibasilar lung opacities, obscuring the left diaphragm. No evidence for edema or pneumothorax. Dysmorphic ribs and scoliosis fixation.  IMPRESSION: 1. Persistent lower lobe atelectasis or pneumonia, greater on the left. 2. Endotracheal tube remains in good position.    TELE:  SR to ST, no bradycardia    ASSESSMENT AND PLAN  1. Transient bradycardia in the setting of aspiration 2. 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 Recurrent respiratory failure- intubated 4. 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 HCAP  Mr. [name] is a 24 year old functional gentleman with a history of club foot, hemiatrophy of RLE, Klippel Feil Syndrome, scoliosis s/p harrington rod surgery (2003),  restrictive lung physiology, and Tourette's syndrome. He is status post a prior admission with acute respiratory failure secondary to pneumonia and had a complicated hospital course with ARDS, delirium, acute kidney injury, and he required prolonged ventilatory support and underwent tracheostomy. Following discharge, he was readmitted on 2015/01/26 with worsening mental status and respiratory failure requiring reintubation. He has developed worsening renal function and hypotension. Yesterday at 10:19 AM while being suctioned and getting mouth care he developed significant bradycardia with heart rates dropping to 39 bpm, with apparrent sinus bradycardia. Due to posible possible PEA transient CPR was administered. Heart rate ultimately improved. He has not had any further significant bradycardia arrhythmia since. Question if initial episode mediated by a vagal response to suctioning and mouth care.  An echo Doppler study done yesterday showed moderate left ventricular hypertrophy with moderately severe new LV dysfunction with an ejection fraction of 30-35% with diffuse hypokinesis. Presently, the patient is intubated and sedated on the ventilator. His ECG shows sinus rhythm at 82 bpm. He has diffuse T-wave abnormalities in leads 1 and L, V2 through V6, and QT interval is prolonged at 509 ms. His current blood pressure is soft, he has developed acute renal failure post contrast administration, he is recovering his renal output. Based on weights, he has gained 32 lbs in the last 6 days.  I believe that his heart failure is acute (normal LVEF in 03/2014) in the settings of metabolic acidosis, acute kidney failure, his troponin elevation is flat and most probably sec to CHF and acute renal failure.  No ACEI/ARB, once his BP improves we will start BiDil.   He continues to receive a lot of fluids in iv form - ATB, bicarb, he responded well diuretics yesterday, I would increase today to 40 mg iv Q6H. His  Crea is downtrending still severely elevated. No ACEI/ARB, once his BP improves we will start BiDil.   Signed, Lars MassonNELSON, Lovetta Condie H MD, [facility] CenterFACC 06/03/2014

## 2014-06-03 NOTE — Progress Notes (Signed)
PULMONARY / CRITICAL CARE MEDICINE   Name: [name] MRN: [id number] DOB: [birth year]    ADMISSION DATE:  05/27/2014 CONSULTATION DATE:  06/03/2014  REFERRING MD :  [name]  CHIEF COMPLAINT:  Resp Distress  INITIAL PRESENTATION: 24 y.o with long admission in 04/2014 for ARDS, tstomy decannulated 04/27/14, PEG in situ re admitted 4/17 for altered mental status, which improved. Intent was to remove PEG on 4/22. He developed progressive AKI. PCCM consulted for acute resp acidosis on 4/20 after swallow study ? Aspiration. PMH of club foot, hemiatrophy of RLE, Klippel Feil Syndrome, scoliosis s/p harrington rod surgery (2003), Restrictive lung physiology, Tourette's syndrome He is very functional & goes to [facility], lives with mom  STUDIES:  4/18 CT chest/ abd/pelvis:  BLL atx/ pna 4/24 Echocardiogram (performed prior to bradycardic arrest): Moderate LVH, LVEF 30-35%, decreased from LVEF 50-55% 04/05/14  SIGNIFICANT EVENTS: 4/20 swallow eval >> failed 4/20 intubated for resp acidosis 4/23 Failed self extubation due to rapid and severe hypoxemia 4/24 bradycardic arrest triggered by severe agitation during mouth care with approx 5 mins chest compressions and 2 amps epi 4/25 Cardiology consult for LVEF 30-35%, started on bicarb gtt, ENT consulted for trach 4/27 trach per ENT  SUBJECTIVE:  Quiet night, making urine, Cr improved  VITAL SIGNS: Temp:  [96.6 F (35.9 C)-99.1 F (37.3 C)] 98.8 F (37.1 C) (04/28 0800) Pulse Rate:  [86-114] 102 (04/28 0800) Resp:  [8-20] 20 (04/28 0800) BP: (150-191)/(72-103) 150/73 mmHg (04/28 0408) SpO2:  [92 %-100 %] 98 % (04/28 0800) FiO2 (%):  [30 %] 30 % (04/28 0800) Weight:  [170 lb 10.2 oz (77.4 kg)] 170 lb 10.2 oz (77.4 kg) (04/28 0529) HEMODYNAMICS:   VENTILATOR SETTINGS: Vent Mode:  [-] PRVC FiO2 (%):  [30 %] 30 % Set Rate:  [20 bmp] 20 bmp Vt Set:  [450 mL] 450 mL PEEP:  [5 cmH20] 5 cmH20 Plateau Pressure:  [31 cmH20-38 cmH20] 34 cmH20 INTAKE /  OUTPUT:  Intake/Output Summary (Last 24 hours) at 06/03/14 0840 Last data filed at 06/03/14 0800  Gross per 24 hour  Intake 3902.75 ml  Output   3825 ml  Net  77.75 ml    PHYSICAL EXAMINATION: Gen: sedated on vent HENT: NCAT, ETT in place, short neck, trach in place PULM:decreased bs bases CV: RRR, no clear mgr GI: BS+, soft, nontender Derm: no breakdown MSK: atrophy of bilateral legs, normal bulk/tone arms with pitting edema Neuro: Heavily sedated, opens eyes to voice, moves arms/hands spontaneously  LABS:  CBC  Recent Labs Lab 06/01/14 0411 06-27-2014 0500 06/03/14 0500  WBC 4.4 3.8* 5.2  HGB 10.7* 11.0* 11.0*  HCT 34.5* 34.4* 35.8*  PLT 227 233 259   Coag's No results for input(s): APTT, INR in the last 168 hours. BMET  Recent Labs Lab Jun 27, 2014 0500 06/27/14 1634 06/03/14 0500  NA 138 142 141  K 2.8* 3.6 3.6  CL 108 108 105  CO2 BUN 21 22 25*  CREATININE 6.69* 6.92* 6.83*  GLUCOSE 114* 79 95   Electrolytes  Recent Labs Lab 05/29/14 0445  Jun 27, 2014 0500 06-27-2014 1634 06/03/14 0500  CALCIUM 8.2*  < > 6.8* 6.9* 7.3*  PHOS 5.3*  --   --   --   --   < > = values in this interval not displayed. Sepsis Markers  Recent Labs Lab 05/31/14 0545  PROCALCITON 2.00   ABG  Recent Labs Lab 05/28/14 0328 05/31/14 1030 06/01/14 0900  PHART 7.317* 7.126* 7.345*  PCO2ART 38.6 53.9* 35.6  PO2ART 149.0* 130.0* 135*   Liver Enzymes  Recent Labs Lab 05/29/14 0445 05/31/2014 0500  AST 20 13  ALT 22 12  ALKPHOS 61 45  BILITOT 0.4 0.4  ALBUMIN 2.8* 2.3*   Cardiac Enzymes  Recent Labs Lab 05/31/14 1610 05/31/14 2200 06/01/14 0411  TROPONINI 0.18* 0.18* 0.17*   Glucose  Recent Labs Lab 05/18/2014 1321 05/18/2014 1641 05/11/2014 1944 06/01/2014 2348 06/03/14 0327 06/03/14 0749  GLUCAP 130* 80 95 91 89 101*    CXR: CM, low volumes, increasing opacities    ASSESSMENT / PLAN:  PULMONARY OETT 4/20 >>  A: Acute resp failure,  hypercarbic in setting of HCAP and renal failure > improving 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 Wheezing> improved with inhaled steroid Re-do Tracheostomy by ENT 4/27 P:   Continue full vent support, start weaning today No plans for decannulation VAP prevention Redo trach 4/27 per ENT, wean aggressively 4/28 Continue nebulized steroids and BDs started 4/24  CARDIOVASCULAR R IJ CVL 4/20 >>  R femoral A-line 4/24 >> 4/28 A:  Persistent hypotension 4/23 > likely due to acidosis, resolved S/p bradycardic arrest 4/24 New finding of cardiomyopathy by Echo 4/24 > presumably due to acidosis P:  Check Coox F/u cardiology recs  RENAL  Recent Labs Lab 05/22/2014 0500 05/16/2014 1634 06/03/14 0500  K 2.8* 3.6 3.6    Lab Results  Component Value Date   CREATININE 6.83* 06/03/2014   CREATININE 6.92* 05/18/2014   CREATININE 6.69* 05/22/2014    A:   AKI, nonoliguric presumably ATN secondary to IV contrast > improving Metabolic acidosis, mild > improved post bicarb gtt Anasarca due to volume administered Hypokalemia P:   F/u renal recs Lasix > per renal/cardiology Monitor UOP, electrolytes  GASTROINTESTINAL A:   Chronic G tube Constipation P:   SUP: enteral PPI Cont TFs as tolerated PRN dulcolax and miralax 4/23  HEMATOLOGIC A:   No issues P:  DVT px: SQ heparin Monitor CBC intermittently Transfuse per usual ICU guidelines  INFECTIOUS A:   HCAP on admission, resolved P:   Monitor fever curve  ENDOCRINE CBG (last 3)   Recent Labs  06/04/2014 2348 06/03/14 0327 06/03/14 0749  GLUCAP 91 89 101*     A:   Hyperglycemia without prior dx of DM Hypoglycemia 4/27 due to decreased po intake High G tube residuals  P:   Cont SSI initiated 4/23 D10 at 20 continues  NEUROLOGIC A:   Klippel Feil Syndrome ADD Severe agitation causing vent asynchrony and self extubation> reviewing chart shows that he had severe agitation in ICU 04/2014, had extrapyramidal  reaction to Haldol, tolerated Depakote at that time  P:   RASS goal: 0 Stopped precedex, ineffective  Continue fentanyl and versed gtt > d/c versed gtt, wean post trach as tolerated to facilitate weaning  Holding adderall Continue depakote  FAMILY  - Updates: updated mother by phone 4/25  Brett CanalesSteve Minor ACNP [facility] Pager [phone] till 3 pm If no answer page [phone] 06/03/2014, 8:40 AM   Attending:  I have seen and examined the patient with nurse practitioner/resident and agree with the note above.   On my exam Mr. [name] now has a trach, lungs clear, abdomen mildly distended, BS+  Acute respiratory failure> failed SBT this morning but hopefully that is just medication effect; plan to wean sedation and re-attempt SBT and TC trial; d/c versed gtt,  AKI> good UOP, HCO3 stable off of gtt, continue to monitor BMET, UOP,  appreciate renal Sedation/encephalopathy> stop versed gtt, wean fentanyl, continue depakote  My cc time 35 minutes  Heber [place], MD [place] P[facility]M Pager: [phone] Cell: [phone] If no response, call [phone]

## 2014-06-03 NOTE — Progress Notes (Signed)
MEDICATION RELATED CONSULT NOTE - Follow up  Pharmacy Consult for Depakote Indication: agitation  No Known Allergies  Patient Measurements: Height: 4\' 11"  (149.9 cm) Weight: 170 lb 10.2 oz (77.4 kg) IBW/kg (Calculated) : 47.7  Vital Signs: Temp: 99.1 F (37.3 C) (04/28 1200) Temp Source: Core (Comment) (04/28 1200) BP: 150/73 mmHg (04/28 0408) Pulse Rate: 109 (04/28 1200) Intake/Output from previous day: 04/27 0701 - 04/28 0700 In: 3911.3 [I.V.:2321.3; NG/GT:860; IV Piggyback:600] Out: 3650 [Urine:3650] Intake/Output from this shift: Total I/O In: 566.7 [I.V.:166.7; Other:60; NG/GT:290; IV Piggyback:50] Out: 1225 [Urine:1225]  Labs:  Recent Labs  06/01/14 0411 05/09/2014 0500 05/27/2014 1634 06/03/14 0500  WBC 4.4 3.8*  --  5.2  HGB 10.7* 11.0*  --  11.0*  HCT 34.5* 34.4*  --  35.8*  PLT 227 233  --  259  CREATININE 7.32* 6.69* 6.92* 6.83*  ALBUMIN  --  2.3*  --   --   PROT  --  5.1*  --   --   AST  --  13  --   --   ALT  --  12  --   --   ALKPHOS  --  45  --   --   BILITOT  --  0.4  --   --   BILIDIR  --  <0.1  --   --   IBILI  --  NOT CALCULATED  --   --    Estimated Creatinine Clearance: 14.2 mL/min (by C-G formula based on Cr of 6.83).  Medical History: Past Medical History  Diagnosis Date  . Tourette's syndrome   . Tic disorder   . Scoliosis   . Hemiatrophy of right leg   . Club foot     Right  . Mental retardation     mild    Medications:  Scheduled:  . antiseptic oral rinse  7 mL Mouth Rinse QID  . bacitracin  1 application Topical 3 times per day  . budesonide  0.25 mg Nebulization 4 times per day  . chlorhexidine  15 mL Mouth Rinse BID  . feeding supplement (VITAL AF 1.2 CAL)  1,000 mL Per Tube Q24H  . free water  200 mL Per Tube Q6H  . furosemide  40 mg Intravenous 4 times per day  . heparin subcutaneous  5,000 Units Subcutaneous 3 times per day  . insulin aspart  0-9 Units Subcutaneous 6 times per day  . ipratropium-albuterol  3 mL  Nebulization Q6H  . pantoprazole sodium  40 mg Per Tube Q1200  . sodium chloride  3 mL Intravenous Q12H  . valproate sodium  1,000 mg Intravenous Q12H   Infusions:  . dextrose 20 mL/hr (05/21/2014 0343)  . fentaNYL infusion INTRAVENOUS 100 mcg/hr (06/03/14 1200)  . norepinephrine (LEVOPHED) Adult infusion Stopped (05/31/14 0845)   PRN: acetaminophen **OR** [DISCONTINUED] acetaminophen, bisacodyl, diphenhydrAMINE, fentaNYL (SUBLIMAZE) injection, hydrALAZINE, midazolam, [DISCONTINUED] ondansetron **OR** ondansetron (ZOFRAN) IV, polyethylene glycol, sodium chloride  Assessment: 23 y/oM with PMH of Tourette's syndrome, tic disorder, Klippel Feil Syndrome, ADD, and scoliosis known to pharmacy this admission for abx dosing. 4/25 pharmacy was asked to start Depakote d/t agitation and difficulty sedating on the vent. Was on fentanyl and Precedex infusions with RASS 3(goal -1). Wanting to avoid using antipsychotics d/t suspected EPS reaction to Haldol or Seroquel in the past.  VPA level = 56.407mcg/ml on day #4 of 1000mg  IV q12h.  Last 48hrs RASS has ranged from 2 to -2(Current goal 0) on fentanyl infusion. Precedex was  stopped 4/26.   LFTs wnl  CBC/platelets wnl  Goal of Therapy:  Control of agitation VPA serum level: 50-13mcg/ml  Plan:  Cont Depakote 1g IV q12h. Could consider switching to enteral liquid - same dose.  Pharmacy will follow in the background.  Charolotte Eke, PharmD, pager [phone]. 06/03/2014,12:39 PM.

## 2014-06-04 ENCOUNTER — Inpatient Hospital Stay (HOSPITAL_COMMUNITY): Payer: Medicaid Other

## 2014-06-04 DIAGNOSIS — F909 Attention-deficit hyperactivity disorder, unspecified type: Secondary | ICD-10-CM

## 2014-06-04 DIAGNOSIS — R1010 Upper abdominal pain, unspecified: Secondary | ICD-10-CM

## 2014-06-04 LAB — COMPREHENSIVE METABOLIC PANEL
ALT: 15 U/L (ref 0–53)
AST: 21 U/L (ref 0–37)
Albumin: 2.8 g/dL — ABNORMAL LOW (ref 3.5–5.2)
Alkaline Phosphatase: 52 U/L (ref 39–117)
Anion gap: 11 (ref 5–15)
BUN: 32 mg/dL — ABNORMAL HIGH (ref 6–23)
CALCIUM: 7.7 mg/dL — AB (ref 8.4–10.5)
CHLORIDE: 106 mmol/L (ref 96–112)
CO2: 28 mmol/L (ref 19–32)
Creatinine, Ser: 6.98 mg/dL — ABNORMAL HIGH (ref 0.50–1.35)
GFR calc Af Amer: 12 mL/min — ABNORMAL LOW (ref >90)
GFR calc non Af Amer: 10 mL/min — ABNORMAL LOW (ref >90)
GLUCOSE: 111 mg/dL — AB (ref 70–99)
Potassium: 3.3 mmol/L — ABNORMAL LOW (ref 3.5–5.1)
Sodium: 145 mmol/L (ref 135–145)
Total Bilirubin: 0.5 mg/dL (ref 0.3–1.2)
Total Protein: 6.2 g/dL (ref 6.0–8.3)

## 2014-06-04 LAB — GLUCOSE, CAPILLARY
GLUCOSE-CAPILLARY: 111 mg/dL — AB (ref 70–99)
GLUCOSE-CAPILLARY: 115 mg/dL — AB (ref 70–99)
GLUCOSE-CAPILLARY: 129 mg/dL — AB (ref 70–99)
Glucose-Capillary: 119 mg/dL — ABNORMAL HIGH (ref 70–99)
Glucose-Capillary: 104 mg/dL — ABNORMAL HIGH (ref 70–99)

## 2014-06-04 LAB — CBC
HCT: 33.4 % — ABNORMAL LOW (ref 39.0–52.0)
Hemoglobin: 10.4 g/dL — ABNORMAL LOW (ref 13.0–17.0)
MCH: 28.7 pg (ref 26.0–34.0)
MCHC: 31.1 g/dL (ref 30.0–36.0)
MCV: 92 fL (ref 78.0–100.0)
PLATELETS: 296 10*3/uL (ref 150–400)
RBC: 3.63 MIL/uL — AB (ref 4.22–5.81)
RDW: 14.3 % (ref 11.5–15.5)
WBC: 8.7 10*3/uL (ref 4.0–10.5)

## 2014-06-04 LAB — MAGNESIUM: Magnesium: 1.9 mg/dL (ref 1.5–2.5)

## 2014-06-04 LAB — PHOSPHORUS: Phosphorus: 7.2 mg/dL — ABNORMAL HIGH (ref 2.3–4.6)

## 2014-06-04 MED ORDER — VITAL 1.5 CAL PO LIQD
1000.0000 mL | ORAL | Status: DC
Start: 2014-06-04 — End: 2014-06-06
  Administered 2014-06-04 – 2014-06-05 (×2): 1000 mL
  Filled 2014-06-04 (×3): qty 1000

## 2014-06-04 MED ORDER — DEXMETHYLPHENIDATE HCL ER 20 MG PO CP24
20.0000 mg | ORAL_CAPSULE | Freq: Every day | ORAL | Status: DC
  Administered 2014-06-04 – 2014-06-10 (×6): 20 mg via ORAL
  Filled 2014-06-04 (×6): qty 1

## 2014-06-04 MED ORDER — PRO-STAT SUGAR FREE PO LIQD
30.0000 mL | Freq: Two times a day (BID) | ORAL | Status: DC
  Administered 2014-06-04 – 2014-06-06 (×5): 30 mL
  Filled 2014-06-04 (×7): qty 30

## 2014-06-04 MED ORDER — POTASSIUM CHLORIDE 10 MEQ/50ML IV SOLN
10.0000 meq | INTRAVENOUS | Status: AC
  Administered 2014-06-04 (×2): 10 meq via INTRAVENOUS
  Filled 2014-06-04 (×2): qty 50

## 2014-06-04 MED ORDER — DEXMEDETOMIDINE HCL IN NACL 400 MCG/100ML IV SOLN
0.4000 ug/kg/h | INTRAVENOUS | Status: DC
  Administered 2014-06-04: 0.4 ug/kg/h via INTRAVENOUS
  Administered 2014-06-05: 1 ug/kg/h via INTRAVENOUS
  Administered 2014-06-05 (×3): 1.2 ug/kg/h via INTRAVENOUS
  Administered 2014-06-05: 1 ug/kg/h via INTRAVENOUS
  Administered 2014-06-05 (×2): 1.2 ug/kg/h via INTRAVENOUS
  Administered 2014-06-05: 1 ug/kg/h via INTRAVENOUS
  Administered 2014-06-05: 1.2 ug/kg/h via INTRAVENOUS
  Administered 2014-06-05 (×2): 1 ug/kg/h via INTRAVENOUS
  Administered 2014-06-06 (×9): 1.2 ug/kg/h via INTRAVENOUS
  Administered 2014-06-07: 1 ug/kg/h via INTRAVENOUS
  Administered 2014-06-07 (×2): 1.2 ug/kg/h via INTRAVENOUS
  Administered 2014-06-07: 1.1 ug/kg/h via INTRAVENOUS
  Administered 2014-06-07: 1.2 ug/kg/h via INTRAVENOUS
  Administered 2014-06-08: 0.6 ug/kg/h via INTRAVENOUS
  Administered 2014-06-08: 0.4 ug/kg/h via INTRAVENOUS
  Administered 2014-06-08: 1 ug/kg/h via INTRAVENOUS
  Administered 2014-06-09: 0.4 ug/kg/h via INTRAVENOUS
  Filled 2014-06-04: qty 50
  Filled 2014-06-04 (×3): qty 100
  Filled 2014-06-04: qty 50
  Filled 2014-06-04 (×4): qty 100
  Filled 2014-06-04 (×2): qty 50
  Filled 2014-06-04: qty 100
  Filled 2014-06-04 (×4): qty 50
  Filled 2014-06-04 (×5): qty 100
  Filled 2014-06-04: qty 50
  Filled 2014-06-04 (×2): qty 100
  Filled 2014-06-04: qty 50

## 2014-06-04 NOTE — Progress Notes (Signed)
eLink Physician-Brief Progress Note Patient Name: [name] DOB: 05/17/1990 MRN: [id number]   Date of Service  06/04/2014  HPI/Events of Note  Patient c/o abdominal discomfort.   eICU Interventions  Will order KUB film now.     Intervention Category Minor Interventions: Clinical assessment - ordering diagnostic tests  [name],Yaniyah Koors [place] 06/04/2014, 3:49 AM

## 2014-06-04 NOTE — Progress Notes (Addendum)
NUTRITION FOLLOW UP  Intervention:   D/C Vital AF 1.2  Initiate Vital 1.5 @ 40 ml/hr via peg tube.  30 ml Prostat BID.    Tube feeding regimen provides 1640 kcal (97% of needs), 95 grams of protein, and 730 ml of H2O.    Nutrition Dx:   Inadequate oral intake related to inability to eat as evidenced by NPO; ongoing  Goal:   Pt to meet >/= 90% of their estimated nutrition needs; not met  Monitor:   TF initiation and tolerance, weight trend, vent status/settings, labs  Assessment:   24 y.o with long admission in 04/2014 for ARDS, tstomy decannulated 04/27/14, PEG in situ re admitted 4/17 for altered mental status, which improved. Intent was to remove PEG on 4/22. He developed progressive AKI. PCCM consulted for acute resp acidosis on 4/20 after swallow study ? aspiration PMH of club foot, hemiatrophy of RLE, Klippel Feil Syndrome, scoliosis s/p harrington rod surgery (2003), Restrictive lung disease, & Tourette's syndrome / Tic disorder  -Intubated 4/20 -Failed self-extubation 4/23 -Bradycardiac arrest triggered by severe agitation during mouth care with approx 5 mins chest compressions and 2 amps epi 4/24  - Pt with 13 lb wt loss in one month. (9%- significant for time frame) - No signs of fat or muscle wasting. - Suspect some form of malnutrition, but pt does not meet criteria at this time.    4/29: - Pt s/p trach placement 4/27.  - Pt with complaints of abdominal distension. MD decreased TF rate to 40 mL/hr.  Damaris Schooner with MD- will change formula. MD asked to stay at 40 mL/hr or below.  - Pt having bowel movements. - Pt in + fl balance. Admission weight used for TF calculations.  - Will change TF formula to better fit pt's nutritional needs and promote tolerance.   Patient is currently intubated on ventilator support MV: 8.9 L/min Temp (24hrs), Avg:98.8 F (37.1 C), Min:97.8 F (36.6 C), Max:99.5 F (37.5 C)  Propofol: none  Labs and medications reviewed  Height: Ht  Readings from Last 1 Encounters:  05/29/14 _0  (1.499 m)    Weight Status:   Wt Readings from Last 1 Encounters:  06/03/14 170 lb 10.2 oz (77.4 kg)    Re-estimated needs (4/26):  Kcal: 1687 Protein: 90-105 g Fluid: 1.7 L/day  Skin: closed incision on neck  Diet Order:     Intake/Output Summary (Last 24 hours) at 06/04/14 1311 Last data filed at 06/04/14 1200  Gross per 24 hour  Intake   2450 ml  Output   5050 ml  Net  -2600 ml    Last BM: 4/29   Labs:   Recent Labs Lab 05/29/14 0445  05/11/2014 1634 06/03/14 0500 06/04/14 0500  NA 147*  < > 142 141 145  K 4.2  < > 3.6 3.6 3.3*  CL 119*  < > 108 105 106  CO2 21  < > _1 BUN 21  < > 22 25* 32*  CREATININE 7.06*  < > 6.92* 6.83* 6.98*  CALCIUM 8.2*  < > 6.9* 7.3* 7.7*  MG  --   --   --   --  1.9  PHOS 5.3*  --   --   --  7.2*  GLUCOSE 209*  < > 79 95 111*  < > = values in this interval not displayed.  CBG (last 3)   Recent Labs  06/04/14 0340 06/04/14 0800 06/04/14 1245  GLUCAP 111* 115* 104*  Scheduled Meds: . antiseptic oral rinse  7 mL Mouth Rinse QID  . bacitracin  1 application Topical 3 times per day  . budesonide  0.25 mg Nebulization 4 times per day  . chlorhexidine  15 mL Mouth Rinse BID  . dexmethylphenidate  20 mg Oral QAC breakfast  . feeding supplement (VITAL AF 1.2 CAL)  1,000 mL Per Tube Q24H  . free water  200 mL Per Tube Q6H  . heparin subcutaneous  5,000 Units Subcutaneous 3 times per day  . insulin aspart  0-9 Units Subcutaneous 6 times per day  . ipratropium-albuterol  3 mL Nebulization Q6H  . pantoprazole sodium  40 mg Per Tube Q1200  . sodium chloride  3 mL Intravenous Q12H  . valproate sodium  1,000 mg Intravenous Q12H    Continuous Infusions: . fentaNYL infusion INTRAVENOUS 300 mcg/hr (06/04/14 1200)  . norepinephrine (LEVOPHED) Adult infusion Stopped (05/31/14 0845)    [name] [place], [place], [place]

## 2014-06-04 NOTE — Progress Notes (Signed)
eLink Physician-Brief Progress Note Patient Name: [name] DOB: 07/05/1990 MRN: [id number]   Date of Service  06/04/2014  HPI/Events of Note  Agitation in spite of high dose Fentanyl IV infusion at 350 mcg/hour and Versed 4 mg IV Q 1 hour PRN.  eICU Interventions  Will restart Precedex IV infusion.      Intervention Category Minor Interventions: Agitation / anxiety - evaluation and management  [name],Rolando Hessling [place] 06/04/2014, 10:43 PM

## 2014-06-04 NOTE — Progress Notes (Signed)
Patient ID: [name], male   DOB: [birth year], 24 y.o.   MRN: [id number]  [name] Progress Note   Assessment/ Plan:   1. AKI: Data/timeline of events is suggestive of contrast-induced nephropathy versus ischemic ATN. Remains nonoliguric on furosemide and I suspect that he may have been having some intravascular volume contraction given the rise of his BUN/bicarbonate. I agree with the discontinuation of furosemide and monitoring his urine output/renal function for possible recovery. Replace potassium. 2. Ventilatory dependent respiratory failure/structural lung disease: Status post tracheostomy and weaning efforts per CCM 3. Hypokalemia: Secondary to brisk urine output/ongoing diuretic therapy-agree with temporarily stopping diuretics and replacing potassium via enteral route. 4. Abdominal pain/distention: Secondary to constipation/flatus-treatment noted.  Subjective:   No acute events overnight-had some abdominal is comfortable earlier for which he had a KUB that did not show any ileus but showed gaseous distention    Objective:   BP 151/69 mmHg  Pulse 104  Temp(Src) 97.8 F (36.6 C) (Oral)  Resp 20  Ht  (1.499 m)  Wt 77.4 kg (170 lb 10.2 oz)  BMI 34.45 kg/m2  SpO2 97%  Intake/Output Summary (Last 24 hours) at 06/04/14 1305 Last data filed at 06/04/14 1200  Gross per 24 hour  Intake   2450 ml  Output   5050 ml  Net  -2600 ml   Weight change:   Physical Exam: Gen: Appears to be fidgety on the ventilator via tracheostomy CVS: Pulse regular tachycardia, S1 and S2 normal Resp: Core/transmitted breath sounds bilaterally Abd: Soft, distended, tympanitic, bowel sounds normal Ext: Right lower extremity hemiatrophy, left lower extremity 1+ edema  Imaging: Dg Abd 1 View  06/04/2014   CLINICAL DATA:  Abdominal pain  EXAM: ABDOMEN - 1 VIEW  COMPARISON:  KUB of May 29, 2014  FINDINGS: There is mild gaseous distention of the stomach. The gastrostomy tube and  inflation bowling are visible overlying the gastric gas collection. There small amount of gas at projects in the right mid abdomen. There is also a small gas collection in the pelvis. The gas pattern does not appear obstructed. The patient has undergone extensive spinal stabilization -fusion. The bony pelvis is unremarkable.  IMPRESSION: Somewhat more gaseous distention of bowel than on the previous study but there is no evidence of obstruction or ileus.   Electronically Signed   By: David  [place] M.D.   On: 06/04/2014 07:11   Dg Chest Port 1 View  06/04/2014   CLINICAL DATA:  Respiratory failure.  EXAM: PORTABLE CHEST - 1 VIEW  COMPARISON:  06/03/2014.  FINDINGS: Right IJ line and tracheostomy tube in stable position. Stable cardiomegaly. Persistent infiltrate left lower lobe. No pneumothorax. Stable chest wall deformity with spinal stabilization hardware intact.  IMPRESSION: 1. Right IJ line and tracheostomy tube in stable position. 2. Persistent unchanged left lower lobe infiltrate.   Electronically Signed   By: Maisie Fus  Register   On: 06/04/2014 07:10   Dg Chest Port 1 View  06/03/2014   CLINICAL DATA:  Acute respiratory failure with hypoxia. Shortness of breath.  EXAM: PORTABLE CHEST - 1 VIEW  COMPARISON:  06/01/2014, 05/31/2014 and 05/30/2014  FINDINGS: Endotracheal tube has been removed and tracheostomy tube has been inserted. Central line is unchanged with the tip in the region of the upper superior vena cava. No change in cardiomegaly. Chronic chest deformity. Persistent lower lobe atelectasis/infiltrate on the left. Scarring at the right base.  IMPRESSION: New tracheostomy tube.  No other change.   Electronically Signed  By: Francene BoyersJames  Maxwell M.D.   On: 06/03/2014 10:15    Labs: BMET  Recent Labs Lab 05/29/14 0445  05/30/14 1730 05/31/14 1100 06/01/14 0411 05/13/2014 0500 06/03/2014 1634 06/03/14 0500 06/04/14 0500  NA 147*  < > 145 135 134* 138 142 141 145  K 4.2  < > 4.0 3.7 3.0* 2.8* 3.6  3.6 3.3*  CL 119*  < > 117* 112 108 108 108 105 106  CO2 21  < > 18* 15* 20 23 25 24 28   GLUCOSE 209*  < > 160* 148* 148* 114* 79 95 111*  BUN 21  < > 23 23 22 21 22  25* 32*  CREATININE 7.06*  < > 7.19* 7.43* 7.32* 6.69* 6.92* 6.83* 6.98*  CALCIUM 8.2*  < > 7.5* 7.0* 6.8* 6.8* 6.9* 7.3* 7.7*  PHOS 5.3*  --   --   --   --   --   --   --  7.2*  < > = values in this interval not displayed. CBC  Recent Labs Lab 06/01/14 0411 05/10/2014 0500 06/03/14 0500 06/04/14 0500  WBC 4.4 3.8* 5.2 8.7  NEUTROABS 2.6 2.1 3.3  --   HGB 10.7* 11.0* 11.0* 10.4*  HCT 34.5* 34.4* 35.8* 33.4*  MCV 94.5 93.0 93.0 92.0  PLT 227 233 259 296   Medications:    . antiseptic oral rinse  7 mL Mouth Rinse QID  . bacitracin  1 application Topical 3 times per day  . budesonide  0.25 mg Nebulization 4 times per day  . chlorhexidine  15 mL Mouth Rinse BID  . dexmethylphenidate  20 mg Oral QAC breakfast  . feeding supplement (VITAL AF 1.2 CAL)  1,000 mL Per Tube Q24H  . free water  200 mL Per Tube Q6H  . heparin subcutaneous  5,000 Units Subcutaneous 3 times per day  . insulin aspart  0-9 Units Subcutaneous 6 times per day  . ipratropium-albuterol  3 mL Nebulization Q6H  . pantoprazole sodium  40 mg Per Tube Q1200  . sodium chloride  3 mL Intravenous Q12H  . valproate sodium  1,000 mg Intravenous Q12H   Zetta BillsJay Clary Boulais, MD 06/04/2014, 1:05 PM

## 2014-06-04 NOTE — Progress Notes (Signed)
RT attempted to wean patient to ATC. RT increased Fi02 to as much as 80% with a Sp02 no greater than 83%. RT placed patient back on ventilator. RN aware. PT does not appear to be in distress at this time.

## 2014-06-04 NOTE — Progress Notes (Signed)
PULMONARY / CRITICAL CARE MEDICINE   Name: [name] MRN: [id number] DOB: 09/27/1990    ADMISSION DATE:  05/18/2014 CONSULTATION DATE:  06/04/2014  REFERRING MD :  [name]  CHIEF COMPLAINT:  Resp Distress  INITIAL PRESENTATION: 24 y.o with long admission in 04/2014 for ARDS, tstomy decannulated 04/27/14, PEG in situ re admitted 4/17 for altered mental status, which improved. Intent was to remove PEG on 4/22. He developed progressive AKI. PCCM consulted for acute resp acidosis on 4/20 after swallow study ? Aspiration. PMH of club foot, hemiatrophy of RLE, Klippel Feil Syndrome, scoliosis s/p harrington rod surgery (2003), Restrictive lung physiology, Tourette's syndrome He is very functional & [facility], lives with mom  STUDIES:  4/18 CT chest/ abd/pelvis:  BLL atx/ pna 4/24 Echocardiogram (performed prior to bradycardic arrest): Moderate LVH, LVEF 30-35%, decreased from LVEF 50-55% 04/05/14  SIGNIFICANT EVENTS: 4/20 swallow eval >> failed 4/20 intubated for resp acidosis 4/23 Failed self extubation due to rapid and severe hypoxemia 4/24 bradycardic arrest triggered by severe agitation during mouth care with approx 5 mins chest compressions and 2 amps epi 4/25 Cardiology consult for LVEF 30-35%, started on bicarb gtt, ENT consulted for trach 4/27 trach per ENT  SUBJECTIVE:  Still making great urine Did not wean yesterday  VITAL SIGNS: Temp:  [98.1 F (36.7 C)-99.5 F (37.5 C)] 98.1 F (36.7 C) (04/29 0800) Pulse Rate:  [90-138] 111 (04/29 0800) Resp:  [18-27] 20 (04/29 0800) BP: (135-175)/(43-89) 159/89 mmHg (04/29 0800) SpO2:  [93 %-99 %] 97 % (04/29 0800) FiO2 (%):  [30 %] 30 % (04/29 0459) HEMODYNAMICS:   VENTILATOR SETTINGS: Vent Mode:  [-] PRVC FiO2 (%):  [30 %] 30 % Set Rate:  [20 bmp] 20 bmp Vt Set:  [450 mL] 450 mL PEEP:  [5 cmH20] 5 cmH20 Plateau Pressure:  [30 cmH20-35 cmH20] 33 cmH20 INTAKE / OUTPUT:  Intake/Output Summary (Last 24 hours) at 06/04/14  0853 Last data filed at 06/04/14 0800  Gross per 24 hour  Intake 2477.33 ml  Output   4900 ml  Net -2422.67 ml    PHYSICAL EXAMINATION: Gen: awake on vent, intermittently agitated HENT: NCAT, trach in place, trach in place PULM:decreased breath sounds left base, othwerwise clear CV: RRR, no clear mgr GI: BS+, soft, nontender Derm: no breakdown MSK: atrophy of bilateral legs, normal bulk/tone arms with pitting edema Neuro: Agitated intermittently trying to get out of bed  LABS:  CBC  Recent Labs Lab 20-Jun-2014 0500 06/03/14 0500 06/04/14 0500  WBC 3.8* 5.2 8.7  HGB 11.0* 11.0* 10.4*  HCT 34.4* 35.8* 33.4*  PLT 233 259 296   Coag's No results for input(s): APTT, INR in the last 168 hours. BMET  Recent Labs Lab 20-Jun-2014 1634 06/03/14 0500 06/04/14 0500  NA 142 141 145  K 3.6 3.6 3.3*  CL 108 105 106  CO2 25 24 28   BUN 22 25* 32*  CREATININE 6.92* 6.83* 6.98*  GLUCOSE 79 95 111*   Electrolytes  Recent Labs Lab 05/29/14 0445  20-Jun-2014 1634 06/03/14 0500 06/04/14 0500  CALCIUM 8.2*  < > 6.9* 7.3* 7.7*  MG  --   --   --   --  1.9  PHOS 5.3*  --   --   --  7.2*  < > = values in this interval not displayed. Sepsis Markers  Recent Labs Lab 05/31/14 0545  PROCALCITON 2.00   ABG  Recent Labs Lab 05/31/14 1030 06/01/14 0900  PHART 7.126* 7.345*  PCO2ART  53.9* 35.6  PO2ART 130.0* 135*   Liver Enzymes  Recent Labs Lab 05/29/14 0445 06/01/2014 0500 06/04/14 0500  AST ALT ALKPHOS 61 45 52  BILITOT 0.4 0.4 0.5  ALBUMIN 2.8* 2.3* 2.8*   Cardiac Enzymes  Recent Labs Lab 05/31/14 1610 05/31/14 2200 06/01/14 0411  TROPONINI 0.18* 0.18* 0.17*   Glucose  Recent Labs Lab 06/03/14 1157 06/03/14 1611 06/03/14 1958 06/04/14 0042 06/04/14 0340 06/04/14 0800  GLUCAP 135* 117* 119* 129* 111* 115*    CXR: CM, low volumes, increasing opacities    ASSESSMENT / PLAN:  PULMONARY OETT 4/20 >>  A: Acute resp failure,  hypercarbic in setting of HCAP and renal failure > improving 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 Re-do Tracheostomy by ENT 4/27 Wheezing improved with inhaled steroid P:   Continue full vent support, start weaning today No plans for decannulation VAP prevention Trach collar trials today Continue nebulized steroids and BDs started 4/24   CARDIOVASCULAR R IJ CVL 4/20 >>  A:  Persistent hypotension 4/23 > likely due to acidosis, resolved S/p bradycardic arrest 4/24 New finding of cardiomyopathy by Echo 4/24 > presumably due to acidosis P:  Check Coox F/u cardiology recs Place PICC when OK by renal  RENAL  Recent Labs Lab 06/04/2014 1634 06/03/14 0500 06/04/14 0500  K 3.6 3.6 3.3*    Lab Results  Component Value Date   CREATININE 6.98* 06/04/2014   CREATININE 6.83* 06/03/2014   CREATININE 6.92* 05/25/2014    A:   AKI, nonoliguric presumably ATN secondary to IV contrast > improving Anasarca due to volume administered > improved Hypokalemia P:   F/u renal recs D/c lasix for today Replace KCL  GASTROINTESTINAL A:   Chronic G tube, working well Bloating, new 4/29> likely volume of tube feeds is too high, KUB images reviewed, OK Constipation P:   SUP: enteral PPI Cont TFs, decrease volume to 63ml/hr PRN dulcolax and miralax 4/23  HEMATOLOGIC A:   No issues P:  DVT px: SQ heparin Monitor CBC intermittently Transfuse per usual ICU guidelines   INFECTIOUS A:   HCAP on admission, resolved P:   Monitor fever curve  ENDOCRINE CBG (last 3)   Recent Labs  06/04/14 0042 06/04/14 0340 06/04/14 0800  GLUCAP 129* 111* 115*     A:   Hyperglycemia without prior dx of DM Hypoglycemia 4/27 due to decreased po intake High G tube residuals  P:   Cont SSI initiated 4/23 D/C D10   NEUROLOGIC A:   Klippel Feil Syndrome ADD Severe agitation causing vent asynchrony and self extubation> history of Tourette's, ADHD, had EPS reaction to haldol last hospital  stay, Depakote helping; precedex ineffective for him  P:   RASS goal: 0 Continue fentanyl gtt, prn versed Add back adderall 4/29 Continue depakote  FAMILY  - Updates: updated mother by phone 4/25, left message 4/29    Heber Canjilon, MD [facility] Pager: [phone] Cell: [phone] If no response, call [phone]

## 2014-06-05 LAB — BASIC METABOLIC PANEL
ANION GAP: 14 (ref 5–15)
BUN: 36 mg/dL — AB (ref 6–23)
CO2: 32 mmol/L (ref 19–32)
Calcium: 8.4 mg/dL (ref 8.4–10.5)
Chloride: 106 mmol/L (ref 96–112)
Creatinine, Ser: 6.26 mg/dL — ABNORMAL HIGH (ref 0.50–1.35)
GFR calc non Af Amer: 11 mL/min — ABNORMAL LOW (ref >90)
GFR, EST AFRICAN AMERICAN: 13 mL/min — AB (ref >90)
Glucose, Bld: 108 mg/dL — ABNORMAL HIGH (ref 70–99)
Potassium: 3.5 mmol/L (ref 3.5–5.1)
SODIUM: 152 mmol/L — AB (ref 135–145)

## 2014-06-05 LAB — GLUCOSE, CAPILLARY
Glucose-Capillary: 99 mg/dL (ref 70–99)
Glucose-Capillary: 105 mg/dL — ABNORMAL HIGH (ref 70–99)
Glucose-Capillary: 126 mg/dL — ABNORMAL HIGH (ref 70–99)
Glucose-Capillary: 111 mg/dL — ABNORMAL HIGH (ref 70–99)
Glucose-Capillary: 97 mg/dL (ref 70–99)
Glucose-Capillary: 195 mg/dL — ABNORMAL HIGH (ref 70–99)
Glucose-Capillary: 120 mg/dL — ABNORMAL HIGH (ref 70–99)

## 2014-06-05 LAB — MAGNESIUM: MAGNESIUM: 2 mg/dL (ref 1.5–2.5)

## 2014-06-05 MED ORDER — QUETIAPINE FUMARATE 100 MG PO TABS
100.0000 mg | ORAL_TABLET | Freq: Two times a day (BID) | ORAL | Status: DC
  Administered 2014-06-05 (×2): 100 mg via ORAL
  Filled 2014-06-05 (×2): qty 1

## 2014-06-05 MED ORDER — BUDESONIDE 0.25 MG/2ML IN SUSP
0.5000 mg | Freq: Two times a day (BID) | RESPIRATORY_TRACT | Status: DC
  Administered 2014-06-05: 0.5 mg via RESPIRATORY_TRACT
  Filled 2014-06-05 (×2): qty 4

## 2014-06-05 MED ORDER — DEXTROSE 5 % IV SOLN
INTRAVENOUS | Status: DC
Start: 2014-06-05 — End: 2014-06-11
  Administered 2014-06-05: 100 mL via INTRAVENOUS
  Administered 2014-06-06 – 2014-06-11 (×7): via INTRAVENOUS

## 2014-06-05 MED ORDER — HYDRALAZINE HCL 20 MG/ML IJ SOLN
10.0000 mg | INTRAMUSCULAR | Status: DC | PRN
  Administered 2014-06-08: 20 mg via INTRAVENOUS
  Filled 2014-06-05: qty 1

## 2014-06-05 MED ORDER — POTASSIUM CHLORIDE 20 MEQ/15ML (10%) PO SOLN
40.0000 meq | Freq: Once | ORAL | Status: AC
  Administered 2014-06-05: 40 meq
  Filled 2014-06-05: qty 30

## 2014-06-05 NOTE — Progress Notes (Signed)
Patient ID: [name], male   DOB: 09/28/1990, 24 y.o.   MRN: [id number]  [facility] Progress Note    Assessment/ Plan:   1. AKI: Data/timeline of events is suggestive of contrast-induced nephropathy versus ischemic ATN. Remains with excellent urine output off furosemide. Renal function slightly better-continue to monitor; no dialysis needs. 2. Ventilatory dependent respiratory failure/structural lung disease: Status post tracheostomy and weaning efforts per CCM. Very agitated with lower sedation and therefore requiring higher amounts of sedation. 3. Hypokalemia: Secondary to brisk urine output/ongoing diuretic therapy-status post single dose of potassium replacement earlier today 4. Abdominal pain/distention: Secondary to constipation/flatus-treatment noted. 5. Hypernatremia: He has been noted to have high volume residuals with tube feeds which likely will limit the amount of free water flushes by NG tube-will start him on D5W (water deficit is about 4 L) with ongoing high urine output.  Subjective:   No acute events overnight    Objective:   BP 137/96 mmHg  Pulse 64  Temp(Src) 97.4 F (36.3 C) (Oral)  Resp 20  Ht 4\' 11"  (1.499 m)  Wt 77.4 kg (170 lb 10.2 oz)  BMI 34.45 kg/m2  SpO2 99%  Intake/Output Summary (Last 24 hours) at 06/05/14 1216 Last data filed at 06/05/14 1112  Gross per 24 hour  Intake 2376.78 ml  Output   3575 ml  Net -1198.22 ml   Weight change:   Physical Exam: Gen: Comfortable on the ventilator via tracheostomy CVS: Pulse regular in rate and rhythm Resp: Clear to auscultation, no rales Abd: Soft, obese, tympanitic, bowel sounds normal Ext: Right LE hemiatrophy, left LE trace-1+ edema.  Imaging: Dg Abd 1 View  06/04/2014   CLINICAL DATA:  Abdominal pain  EXAM: ABDOMEN - 1 VIEW  COMPARISON:  KUB of May 29, 2014  FINDINGS: There is mild gaseous distention of the stomach. The gastrostomy tube and inflation bowling are visible overlying the  gastric gas collection. There small amount of gas at projects in the right mid abdomen. There is also a small gas collection in the pelvis. The gas pattern does not appear obstructed. The patient has undergone extensive spinal stabilization -fusion. The bony pelvis is unremarkable.  IMPRESSION: Somewhat more gaseous distention of bowel than on the previous study but there is no evidence of obstruction or ileus.   Electronically Signed   By: David  SwazilandJordan M.D.   On: 06/04/2014 07:11   Dg Chest Port 1 View  06/04/2014   CLINICAL DATA:  Respiratory failure.  EXAM: PORTABLE CHEST - 1 VIEW  COMPARISON:  06/03/2014.  FINDINGS: Right IJ line and tracheostomy tube in stable position. Stable cardiomegaly. Persistent infiltrate left lower lobe. No pneumothorax. Stable chest wall deformity with spinal stabilization hardware intact.  IMPRESSION: 1. Right IJ line and tracheostomy tube in stable position. 2. Persistent unchanged left lower lobe infiltrate.   Electronically Signed   By: Maisie Fushomas  Register   On: 06/04/2014 07:10   Dg Abd Portable 1v  06/04/2014   CLINICAL DATA:  Generalized abdominal pain and distention.  EXAM: PORTABLE ABDOMEN - 1 VIEW  COMPARISON:  Same day.  FINDINGS: Gastrostomy tube is seen projected over gastric air bubble. Status post surgical fusion of visualized thoracic and lumbar spine with Harrington rods. Stable deformity of bilateral lower ribs. Air-filled colon is noted, but no significant bowel dilatation is noted.  IMPRESSION: No definite evidence of bowel obstruction or ileus. Chronic findings as described above.   Electronically Signed   By: Lupita RaiderJames  Green Jr, M.D.  On: 06/04/2014 16:17    Labs: BMET  Recent Labs Lab 05/31/14 1100 06/01/14 0411 2014/06/21 0500 21-Jun-2014 1634 06/03/14 0500 06/04/14 0500 06/05/14 0425  NA 135 134* 138 142 141 145 152*  K 3.7 3.0* 2.8* 3.6 3.6 3.3* 3.5  CL 112 108 108 108 105 106 106  CO2 15* 32  GLUCOSE 148* 148* 114* 79 95 111*  108*  BUN 25* 32* 36*  CREATININE 7.43* 7.32* 6.69* 6.92* 6.83* 6.98* 6.26*  CALCIUM 7.0* 6.8* 6.8* 6.9* 7.3* 7.7* 8.4  PHOS  --   --   --   --   --  7.2*  --    CBC  Recent Labs Lab 06/01/14 0411 06-21-14 0500 06/03/14 0500 06/04/14 0500  WBC 4.4 3.8* 5.2 8.7  NEUTROABS 2.6 2.1 3.3  --   HGB 10.7* 11.0* 11.0* 10.4*  HCT 34.5* 34.4* 35.8* 33.4*  MCV 94.5 93.0 93.0 92.0  PLT 227 233 259 296    Medications:    . antiseptic oral rinse  7 mL Mouth Rinse QID  . bacitracin  1 application Topical 3 times per day  . budesonide  0.5 mg Nebulization BID  . chlorhexidine  15 mL Mouth Rinse BID  . dexmethylphenidate  20 mg Oral QAC breakfast  . feeding supplement (PRO-STAT SUGAR FREE 64)  30 mL Per Tube BID  . free water  200 mL Per Tube Q6H  . heparin subcutaneous  5,000 Units Subcutaneous 3 times per day  . insulin aspart  0-9 Units Subcutaneous 6 times per day  . ipratropium-albuterol  3 mL Nebulization Q6H  . pantoprazole sodium  40 mg Per Tube Q1200  . potassium chloride  40 mEq Per Tube Once  . QUEtiapine  100 mg Oral BID  . sodium chloride  3 mL Intravenous Q12H  . valproate sodium  1,000 mg Intravenous Q12H      Zetta Bills, MD 06/05/2014, 12:16 PM

## 2014-06-05 NOTE — Progress Notes (Signed)
PULMONARY / CRITICAL CARE MEDICINE   Name: [name] MRN: [id number] DOB: 1990-02-27    ADMISSION DATE:  05/15/2014 CONSULTATION DATE:  06/05/2014  REFERRING MD :  [name]  CHIEF COMPLAINT:  Resp Distress  INITIAL PRESENTATION: 24 y.o with long admission in 04/2014 for ARDS, tstomy decannulated 04/27/14, PEG in situ re admitted 4/17 for altered mental status, which improved. Intent was to remove PEG on 4/22. He developed progressive AKI. PCCM consulted for acute resp acidosis on 4/20 after swallow study ? Aspiration.   PMH of club foot, hemiatrophy of RLE, Klippel Feil Syndrome, scoliosis s/p harrington rod surgery (2003), Restrictive lung physiology, Tourette's syndrome He is very functional & goes to [facility], lives with mom  STUDIES:  4/18 CT chest/ abd/pelvis:  BLL atx/ pna 4/24 Echocardiogram (performed prior to bradycardic arrest): Moderate LVH, LVEF 30-35%, decreased from LVEF 50-55% 04/05/14  SIGNIFICANT EVENTS: 4/20 swallow eval >> failed 4/20 intubated for resp acidosis 4/23 Failed self extubation due to rapid and severe hypoxemia 4/24 bradycardic arrest triggered by severe agitation during mouth care with approx 5 mins chest compressions and 2 amps epi 4/25 Cardiology consult for LVEF 30-35%, started on bicarb gtt, ENT consulted for trach 4/27 trach per ENT  SUBJECTIVE: Afebrile Still making great urine Not weaning [name] on precedex gtt  VITAL SIGNS: Temp:  [96.4 F (35.8 C)-97.9 F (36.6 C)] 97.4 F (36.3 C) (04/30 0933) Pulse Rate:  [61-126] 64 (04/30 0700) Resp:  [15-30] 20 (04/30 0700) BP: (128-165)/(36-100) 137/96 mmHg (04/30 0700) SpO2:  [94 %-100 %] 99 % (04/30 0933) FiO2 (%):  [30 %] 30 % (04/30 0933) HEMODYNAMICS:   VENTILATOR SETTINGS: Vent Mode:  [-] PRVC FiO2 (%):  [30 %] 30 % Set Rate:  [20 bmp] 20 bmp Vt Set:  [450 mL] 450 mL PEEP:  [5 cmH20] 5 cmH20 Plateau Pressure:  [26 cmH20-36 cmH20] 31 cmH20 INTAKE / OUTPUT:  Intake/Output Summary (Last 24  hours) at 06/05/14 1124 Last data filed at 06/05/14 1112  Gross per 24 hour  Intake 2496.78 ml  Output   3875 ml  Net -1378.22 ml    PHYSICAL EXAMINATION: Gen: awake on vent, intermittently agitated HENT: NCAT, trach in place, trach in place PULM:decreased breath sounds left base, othwerwise clear CV: RRR, no clear mgr GI: BS+, soft, nontender Derm: no breakdown MSK: atrophy of bilateral legs, normal bulk/tone arms with pitting edema Neuro: Agitated intermittently trying to get out of bed  LABS:  CBC  Recent Labs Lab 05/19/2014 0500 06/03/14 0500 06/04/14 0500  WBC 3.8* 5.2 8.7  HGB 11.0* 11.0* 10.4*  HCT 34.4* 35.8* 33.4*  PLT 233 259 296   Coag's No results for input(s): APTT, INR in the last 168 hours. BMET  Recent Labs Lab 06/03/14 0500 06/04/14 0500 06/05/14 0425  NA 141 145 152*  K 3.6 3.3* 3.5  CL 105 106 106  CO2 24 28 32  BUN 25* 32* 36*  CREATININE 6.83* 6.98* 6.26*  GLUCOSE 95 111* 108*   Electrolytes  Recent Labs Lab 06/03/14 0500 06/04/14 0500 06/05/14 0425  CALCIUM 7.3* 7.7* 8.4  MG  --  1.9  --   PHOS  --  7.2*  --    Sepsis Markers  Recent Labs Lab 05/31/14 0545  PROCALCITON 2.00   ABG  Recent Labs Lab 05/31/14 1030 06/01/14 0900  PHART 7.126* 7.345*  PCO2ART 53.9* 35.6  PO2ART 130.0* 135*   Liver Enzymes  Recent Labs Lab 05/30/2014 0500 06/04/14 0500  AST 13  21  ALT 12 15  ALKPHOS 45 52  BILITOT 0.4 0.5  ALBUMIN 2.3* 2.8*   Cardiac Enzymes  Recent Labs Lab 05/31/14 1610 05/31/14 2200 06/01/14 0411  TROPONINI 0.18* 0.18* 0.17*   Glucose  Recent Labs Lab 06/03/14 1958 06/04/14 0042 06/04/14 0340 06/04/14 0800 06/04/14 1245 06/05/14 0810  GLUCAP 119* 129* 111* 115* 104* 97    CXR: CM, low volumes, increasing opacities    ASSESSMENT / PLAN:  PULMONARY OETT 4/20 >>  A: Acute resp failure, hypercarbic in setting of HCAP and renal failure > improving Difficult airway due to KF  syndrome Re-do Tracheostomy by ENT 4/27 Wheezing improved with inhaled steroid P:   SBTs daily No plans for decannulation VAP prevention Continue nebulized steroids and BDs started 4/24   CARDIOVASCULAR R IJ CVL 4/20 >>  A:  Persistent hypotension 4/23 > likely due to acidosis, resolved S/p bradycardic arrest 4/24 New finding of cardiomyopathy by Echo 4/24 > presumably due to acidosis P: Start Bidil per cardiology recs Place PICC when OK by renal  RENAL  Recent Labs Lab 06/03/14 0500 06/04/14 0500 06/05/14 0425  K 3.6 3.3* 3.5    Lab Results  Component Value Date   CREATININE 6.26* 06/05/2014   CREATININE 6.98* 06/04/2014   CREATININE 6.83* 06/03/2014    A:   AKI, nonoliguric presumably ATN secondary to IV contrast > improving Anasarca due to volume administered > improved Hypokalemia P:  Cr improving -may need gentle fluids F/u renal recs No more lasix Replace KCL  GASTROINTESTINAL A:   Chronic G tube, working well Bloating, new 4/29> likely volume of tube feeds is too high, KUB images reviewed, OK Constipation P:   SUP: enteral PPI Cont TFs, decrease volume to 1440ml/hr PRN dulcolax and miralax 4/23  HEMATOLOGIC A:   No issues P:  DVT px: SQ heparin Monitor CBC intermittently Transfuse per usual ICU guidelines   INFECTIOUS A:   HCAP on admission, resolved P:   Monitor fever curve  ENDOCRINE CBG (last 3)   Recent Labs  06/04/14 0800 06/04/14 1245 06/05/14 0810  GLUCAP 115* 104* 97     A:   Hyperglycemia without prior dx of DM Hypoglycemia 4/27 due to decreased po intake High G tube residuals  P:   Cont SSI initiated 4/23 D/C D10   NEUROLOGIC A:   Klippel Feil Syndrome ADD Severe agitation causing vent asynchrony and self extubation> history of Tourette's, ADHD, had EPS reaction to haldol last hospital stay, Depakote helping; precedex ineffective for him  P:   RASS goal: 0 Continue fentanyl gtt, prn versed, precedex gtt Ct   adderall 4/29 Continue depakote  FAMILY  - Updates: updated mother by phone 4/25, left message 4/29  The patient is critically ill with multiple organ systems failure and requires high complexity decision making for assessment and support, frequent evaluation and titration of therapies, application of advanced monitoring technologies and extensive interpretation of multiple databases. Critical Care Time devoted to patient care services described in this note independent of APP time is 35 minutes.    Aaron Mourningakesh Marishka Rentfrow MD. Tonny BollmanFCCP. [facility] Pager [phone] If no response call [phone]

## 2014-06-06 LAB — GLUCOSE, CAPILLARY
GLUCOSE-CAPILLARY: 134 mg/dL — AB (ref 70–99)
Glucose-Capillary: 89 mg/dL (ref 70–99)
Glucose-Capillary: 151 mg/dL — ABNORMAL HIGH (ref 70–99)

## 2014-06-06 LAB — RENAL FUNCTION PANEL
ANION GAP: 13 (ref 5–15)
Albumin: 2.6 g/dL — ABNORMAL LOW (ref 3.5–5.0)
BUN: 39 mg/dL — ABNORMAL HIGH (ref 6–20)
CO2: 31 mmol/L (ref 22–32)
Calcium: 6.8 mg/dL — ABNORMAL LOW (ref 8.9–10.3)
Chloride: 107 mmol/L (ref 101–111)
Creatinine, Ser: 5.12 mg/dL — ABNORMAL HIGH (ref 0.61–1.24)
GFR calc non Af Amer: 15 mL/min — ABNORMAL LOW (ref >60)
GFR, EST AFRICAN AMERICAN: 17 mL/min — AB (ref >60)
GLUCOSE: 133 mg/dL — AB (ref 70–99)
Phosphorus: 7 mg/dL — ABNORMAL HIGH (ref 2.5–4.6)
Potassium: 4 mmol/L (ref 3.5–5.1)
SODIUM: 151 mmol/L — AB (ref 135–145)

## 2014-06-06 LAB — CBC
HCT: 34.2 % — ABNORMAL LOW (ref 39.0–52.0)
HEMOGLOBIN: 10.8 g/dL — AB (ref 13.0–17.0)
MCH: 29.5 pg (ref 26.0–34.0)
MCHC: 31.6 g/dL (ref 30.0–36.0)
MCV: 93.4 fL (ref 78.0–100.0)
Platelets: 365 10*3/uL (ref 150–400)
RBC: 3.66 MIL/uL — AB (ref 4.22–5.81)
RDW: 14.8 % (ref 11.5–15.5)
WBC: 5.2 10*3/uL (ref 4.0–10.5)

## 2014-06-06 LAB — MAGNESIUM: MAGNESIUM: 2 mg/dL (ref 1.7–2.4)

## 2014-06-06 MED ORDER — DOCUSATE SODIUM 50 MG/5ML PO LIQD
100.0000 mg | Freq: Two times a day (BID) | ORAL | Status: DC
  Administered 2014-06-06 – 2014-06-10 (×5): 100 mg
  Filled 2014-06-06 (×7): qty 10

## 2014-06-06 MED ORDER — VITAL 1.5 CAL PO LIQD
1000.0000 mL | ORAL | Status: DC
Start: 2014-06-06 — End: 2014-06-06
  Filled 2014-06-06: qty 1000

## 2014-06-06 MED ORDER — BUDESONIDE 0.5 MG/2ML IN SUSP
0.5000 mg | Freq: Two times a day (BID) | RESPIRATORY_TRACT | Status: DC
  Administered 2014-06-06 – 2014-06-10 (×10): 0.5 mg via RESPIRATORY_TRACT
  Filled 2014-06-06 (×9): qty 2

## 2014-06-06 MED ORDER — QUETIAPINE FUMARATE 100 MG PO TABS
200.0000 mg | ORAL_TABLET | Freq: Two times a day (BID) | ORAL | Status: DC
  Administered 2014-06-06 – 2014-06-10 (×9): 200 mg via ORAL
  Filled 2014-06-06 (×10): qty 2

## 2014-06-06 NOTE — Progress Notes (Signed)
Patient ID: [name], male   DOB: January 04, 1991, 24 y.o.   MRN: [id number]   [facility] Progress Note    Assessment/ Plan:   1. AKI: Data/timeline of events is suggestive of contrast-induced nephropathy versus ischemic ATN. With excellent urine output and now what appears to be improvement of renal function. No acute dialysis needs. Will adjust D5W for hypernatremia. Hold off on furosemide at this time-he is urinating well without diuretics.  2. Ventilatory dependent respiratory failure/structural lung disease: Status post tracheostomy and weaning efforts per CCM 3. Hypernatremia: Started on D5W yesterday, increase rate to 150 mL an hour- he has large amounts of intermittent tube feed residuals that prohibit increasing free water flushes via NG tube.  4. Abdominal pain/distention: Secondary to constipation/flatus-treatment noted.  Subjective:    No acute events overnight except for agitation requiring increased sedation    Objective:   BP 131/66 mmHg  Pulse 111  Temp(Src) 97.3 F (36.3 C) (Core (Comment))  Resp 20  Ht  (1.499 m)  Wt 77.4 kg (170 lb 10.2 oz)  BMI 34.45 kg/m2  SpO2 98%  Intake/Output Summary (Last 24 hours) at 06/06/14 1219 Last data filed at 06/06/14 1100  Gross per 24 hour  Intake 4506.6 ml  Output   3295 ml  Net 1211.6 ml   Weight change:   Physical Exam: Gen: Appears comfortable on the ventilator via tracheostomy CVS: Regular tachycardia, S1 and S2 normal Resp: Coarse breath sounds bilaterally, no rales Abd: Soft, obese, nontender Ext: Right lower extremity hemiatrophy, 1+ edema left lower extremity, 2+ edema upper extremities  Imaging: Dg Abd Portable 1v  06/04/2014   CLINICAL DATA:  Generalized abdominal pain and distention.  EXAM: PORTABLE ABDOMEN - 1 VIEW  COMPARISON:  Same day.  FINDINGS: Gastrostomy tube is seen projected over gastric air bubble. Status post surgical fusion of visualized thoracic and lumbar spine with Harrington  rods. Stable deformity of bilateral lower ribs. Air-filled colon is noted, but no significant bowel dilatation is noted.  IMPRESSION: No definite evidence of bowel obstruction or ileus. Chronic findings as described above.   Electronically Signed   By: Lupita Raider, M.D.   On: 06/04/2014 16:17    Labs: BMET  Recent Labs Lab 06/01/14 0411 2014/06/28 0500 Jun 28, 2014 1634 06/03/14 0500 06/04/14 0500 06/05/14 0425 06/06/14 0640  NA 134* 138 142 141 145 152* 151*  K 3.0* 2.8* 3.6 3.6 3.3* 3.5 4.0  CL 108 108 108 105 106 106 107  CO2 32 31  GLUCOSE 148* 114* 79 95 111* 108* 133*  BUN 25* 32* 36* 39*  CREATININE 7.32* 6.69* 6.92* 6.83* 6.98* 6.26* 5.12*  CALCIUM 6.8* 6.8* 6.9* 7.3* 7.7* 8.4 6.8*  PHOS  --   --   --   --  7.2*  --  7.0*   CBC  Recent Labs Lab 06/01/14 0411 06/28/14 0500 06/03/14 0500 06/04/14 0500 06/06/14 0640  WBC 4.4 3.8* 5.2 8.7 5.2  NEUTROABS 2.6 2.1 3.3  --   --   HGB 10.7* 11.0* 11.0* 10.4* 10.8*  HCT 34.5* 34.4* 35.8* 33.4* 34.2*  MCV 94.5 93.0 93.0 92.0 93.4  PLT 227 233 259 296 365    Medications:    . antiseptic oral rinse  7 mL Mouth Rinse QID  . bacitracin  1 application Topical 3 times per day  . budesonide (PULMICORT) nebulizer solution  0.5 mg Nebulization BID  . chlorhexidine  15 mL Mouth Rinse  BID  . dexmethylphenidate  20 mg Oral QAC breakfast  . docusate  100 mg Per Tube BID  . feeding supplement (PRO-STAT SUGAR FREE 64)  30 mL Per Tube BID  . free water  200 mL Per Tube Q6H  . heparin subcutaneous  5,000 Units Subcutaneous 3 times per day  . insulin aspart  0-9 Units Subcutaneous 6 times per day  . ipratropium-albuterol  3 mL Nebulization Q6H  . pantoprazole sodium  40 mg Per Tube Q1200  . QUEtiapine  200 mg Oral BID  . sodium chloride  3 mL Intravenous Q12H  . valproate sodium  1,000 mg Intravenous Q12H   Zetta BillsJay Katheleen Stella, MD 06/06/2014, 12:19 PM

## 2014-06-06 NOTE — Progress Notes (Signed)
PULMONARY / CRITICAL CARE MEDICINE   Name: [name] MRN: [id number] DOB: 08/15/1990    ADMISSION DATE:  05/22/2014 CONSULTATION DATE:  06/06/2014  REFERRING MD :  [name]  CHIEF COMPLAINT:  Resp Distress  INITIAL PRESENTATION: 24 y.o with long admission in 04/2014 for ARDS, tstomy decannulated 04/27/14, PEG in situ re admitted 4/17 for altered mental status, which improved. Intent was to remove PEG on 4/22. He developed progressive AKI. PCCM consulted for acute resp acidosis on 4/20 after swallow study ? Aspiration.   PMH of club foot, hemiatrophy of RLE, Klippel Feil Syndrome, scoliosis s/p harrington rod surgery (2003), Restrictive lung physiology, Tourette's syndrome He is very functional & [facility], lives with mom  STUDIES:  4/18 CT chest/ abd/pelvis:  BLL atx/ pna 4/24 Echocardiogram (performed prior to bradycardic arrest): Moderate LVH, LVEF 30-35%, decreased from LVEF 50-55% 04/05/14  SIGNIFICANT EVENTS: 4/20 swallow eval >> failed 4/20 intubated for resp acidosis 4/23 Failed self extubation due to rapid and severe hypoxemia 4/24 bradycardic arrest triggered by severe agitation during mouth care with approx 5 mins chest compressions and 2 amps epi 4/25 Cardiology consult for LVEF 30-35%, started on bicarb gtt, ENT consulted for trach 4/27 trach per ENT  SUBJECTIVE: Afebrile Good UO Not weaning Huston FoleyBrady on precedex gtt  VITAL SIGNS: Temp:  [95.4 F (35.2 C)-98.1 F (36.7 C)] 97.7 F (36.5 C) (05/01 0600) Pulse Rate:  [52-108] 100 (05/01 0600) Resp:  [20] 20 (05/01 0600) BP: (123-153)/(70-102) 144/95 mmHg (05/01 0600) SpO2:  [89 %-100 %] 96 % (05/01 0854) FiO2 (%):  [30 %] 30 % (05/01 0859) HEMODYNAMICS:   VENTILATOR SETTINGS: Vent Mode:  [-] PRVC FiO2 (%):  [30 %] 30 % Set Rate:  [20 bmp] 20 bmp Vt Set:  [450 mL] 450 mL PEEP:  [5 cmH20] 5 cmH20 Plateau Pressure:  [31 cmH20-45 cmH20] 41 cmH20 INTAKE / OUTPUT:  Intake/Output Summary (Last 24 hours) at 06/06/14  0905 Last data filed at 06/06/14 0600  Gross per 24 hour  Intake 4053.61 ml  Output   3495 ml  Net 558.61 ml    PHYSICAL EXAMINATION: Gen: awake on vent, intermittently agitated HENT: NCAT, trach in place, trach in place PULM:decreased breath sounds left base, othwerwise clear CV: RRR, no clear mgr GI: BS+, soft, nontender Derm: no breakdown MSK: atrophy of bilateral legs, normal bulk/tone arms with pitting edema Neuro: Agitated intermittently trying to get out of bed  LABS:  CBC  Recent Labs Lab 06/03/14 0500 06/04/14 0500 06/06/14 0640  WBC 5.2 8.7 5.2  HGB 11.0* 10.4* 10.8*  HCT 35.8* 33.4* 34.2*  PLT 259 296 365   Coag's No results for input(s): APTT, INR in the last 168 hours. BMET  Recent Labs Lab 06/04/14 0500 06/05/14 0425 06/06/14 0640  NA 145 152* 151*  K 3.3* 3.5 4.0  CL 106 106 107  CO2 28 32 31  BUN 32* 36* 39*  CREATININE 6.98* 6.26* 5.12*  GLUCOSE 111* 108* 133*   Electrolytes  Recent Labs Lab 06/04/14 0500 06/05/14 0425 06/05/14 1300 06/06/14 0640  CALCIUM 7.7* 8.4  --  6.8*  MG 1.9  --  2.0 2.0  PHOS 7.2*  --   --  7.0*   Sepsis Markers  Recent Labs Lab 05/31/14 0545  PROCALCITON 2.00   ABG  Recent Labs Lab 05/31/14 1030 06/01/14 0900  PHART 7.126* 7.345*  PCO2ART 53.9* 35.6  PO2ART 130.0* 135*   Liver Enzymes  Recent Labs Lab 01/09/2015 0500 06/04/14 0500  06/06/14 0640  AST 13 21  --   ALT 12 15  --   ALKPHOS 45 52  --   BILITOT 0.4 0.5  --   ALBUMIN 2.3* 2.8* 2.6*   Cardiac Enzymes  Recent Labs Lab 05/31/14 1610 05/31/14 2200 06/01/14 0411  TROPONINI 0.18* 0.18* 0.17*   Glucose  Recent Labs Lab 06/04/14 2329 06/05/14 0406 06/05/14 0810 06/05/14 1158 06/05/14 1543 06/06/14 0817  GLUCAP 126* 111* 97 195* 120* 134*    CXR: 4/28 CM, low volumes, increasing opacities    ASSESSMENT / PLAN:  PULMONARY OETT 4/20 >>  A: Acute resp failure, hypercarbic in setting of HCAP and renal failure  > improving 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 Re-do Tracheostomy by ENT 4/27 Wheezing improved with inhaled steroid P:   SBTs daily No plans for decannulation VAP prevention Continue nebulized steroids and BDs started 4/24   CARDIOVASCULAR R IJ CVL 4/20 >>  A:  Persistent hypotension 4/23 > likely due to acidosis, resolved S/p bradycardic arrest 4/24 New finding of cardiomyopathy by Echo 4/24 > presumably due to acidosis P: Start Bidil per cardiology recs Place PICC when OK by renal  RENAL  Recent Labs Lab 06/04/14 0500 06/05/14 0425 06/06/14 0640  K 3.3* 3.5 4.0    Lab Results  Component Value Date   CREATININE 5.12* 06/06/2014   CREATININE 6.26* 06/05/2014   CREATININE 6.98* 06/04/2014    A:   AKI, nonoliguric presumably ATN secondary to IV contrast > improving Anasarca due to volume administered > improved Hypokalemia Hypernatremia P:  Cr improving -ct D5W  F/u renal recs No more lasix Replace lytes as needed  GASTROINTESTINAL A:   Chronic G tube, working well Bloating, new 4/29> likely volume of tube feeds is too high, KUB images reviewed, OK Constipation P:   SUP: enteral PPI Cont TFs,  12ml/hr PRN dulcolax and miralax 4/23  HEMATOLOGIC A:   No issues P:  DVT px: SQ heparin Monitor CBC intermittently Transfuse per usual ICU guidelines   INFECTIOUS A:   HCAP on admission, resolved P:   Monitor fever curve  ENDOCRINE CBG (last 3)   Recent Labs  06/05/14 1158 06/05/14 1543 06/06/14 0817  GLUCAP 195* 120* 134*     A:   Hyperglycemia without prior dx of DM Hypoglycemia 4/27 due to decreased po intake High G tube residuals  P:   Cont SSI initiated 4/23   NEUROLOGIC A:   Klippel Feil Syndrome ADD Severe agitation causing vent asynchrony and self extubation> history of Tourette's, ADHD, had EPS reaction to haldol last hospital stay, Depakote helping; precedex ineffective for him  P:   RASS goal: 0 Continue fentanyl gtt,  prn versed, precedex gtt Ct  adderall 4/29 Continue depakote Add seroquel & titrate to effect  FAMILY  - Updates: updated mother by phone 4/25, left message 4/29  The patient is critically ill with multiple organ systems failure and requires high complexity decision making for assessment and support, frequent evaluation and titration of therapies, application of advanced monitoring technologies and extensive interpretation of multiple databases. Critical Care Time devoted to patient care services described in this note independent of APP time is 35 minutes.    Cyril Mourning MD. Tonny Bollman. [facility] Pager [phone] If no response call [phone]

## 2014-06-06 NOTE — Progress Notes (Signed)
eLink Physician-Brief Progress Note Patient Name: [name] DOB: 11/05/1990 MRN: [id number]   Date of Service  06/06/2014  HPI/Events of Note  Intolerant of TF  eICU Interventions  D/c TF      Intervention Category Intermediate Interventions: Abdominal pain - evaluation and management  [name] 06/06/2014, 5:18 PM

## 2014-06-06 DEATH — deceased

## 2014-06-07 ENCOUNTER — Inpatient Hospital Stay (HOSPITAL_COMMUNITY): Payer: Medicaid Other

## 2014-06-07 DIAGNOSIS — K567 Ileus, unspecified: Secondary | ICD-10-CM

## 2014-06-07 LAB — CBC
HCT: 33.8 % — ABNORMAL LOW (ref 39.0–52.0)
HEMOGLOBIN: 10.5 g/dL — AB (ref 13.0–17.0)
MCH: 29 pg (ref 26.0–34.0)
MCHC: 31.1 g/dL (ref 30.0–36.0)
MCV: 93.4 fL (ref 78.0–100.0)
Platelets: 332 10*3/uL (ref 150–400)
RBC: 3.62 MIL/uL — ABNORMAL LOW (ref 4.22–5.81)
RDW: 14.5 % (ref 11.5–15.5)
WBC: 6.3 10*3/uL (ref 4.0–10.5)

## 2014-06-07 LAB — RENAL FUNCTION PANEL
Albumin: 2.6 g/dL — ABNORMAL LOW (ref 3.5–5.0)
Anion gap: 9 (ref 5–15)
BUN: 35 mg/dL — AB (ref 6–20)
CO2: 32 mmol/L (ref 22–32)
CREATININE: 4.35 mg/dL — AB (ref 0.61–1.24)
Calcium: 7.9 mg/dL — ABNORMAL LOW (ref 8.9–10.3)
Chloride: 106 mmol/L (ref 101–111)
GFR calc Af Amer: 21 mL/min — ABNORMAL LOW (ref >60)
GFR, EST NON AFRICAN AMERICAN: 18 mL/min — AB (ref >60)
Glucose, Bld: 120 mg/dL — ABNORMAL HIGH (ref 70–99)
PHOSPHORUS: 6.3 mg/dL — AB (ref 2.5–4.6)
POTASSIUM: 3.2 mmol/L — AB (ref 3.5–5.1)
SODIUM: 147 mmol/L — AB (ref 135–145)

## 2014-06-07 LAB — GLUCOSE, CAPILLARY
GLUCOSE-CAPILLARY: 94 mg/dL (ref 70–99)
GLUCOSE-CAPILLARY: 111 mg/dL — AB (ref 70–99)
Glucose-Capillary: 100 mg/dL — ABNORMAL HIGH (ref 70–99)
Glucose-Capillary: 105 mg/dL — ABNORMAL HIGH (ref 70–99)
Glucose-Capillary: 111 mg/dL — ABNORMAL HIGH (ref 70–99)
Glucose-Capillary: 112 mg/dL — ABNORMAL HIGH (ref 70–99)
Glucose-Capillary: 113 mg/dL — ABNORMAL HIGH (ref 70–99)
Glucose-Capillary: 123 mg/dL — ABNORMAL HIGH (ref 70–99)
Glucose-Capillary: 125 mg/dL — ABNORMAL HIGH (ref 70–99)
Glucose-Capillary: 138 mg/dL — ABNORMAL HIGH (ref 70–99)

## 2014-06-07 LAB — MAGNESIUM: Magnesium: 1.9 mg/dL (ref 1.7–2.4)

## 2014-06-07 MED ORDER — ISOSORB DINITRATE-HYDRALAZINE 20-37.5 MG PO TABS
1.0000 | ORAL_TABLET | Freq: Two times a day (BID) | ORAL | Status: DC
  Administered 2014-06-07 – 2014-06-09 (×5): 1 via ORAL
  Filled 2014-06-07 (×6): qty 1

## 2014-06-07 MED ORDER — POTASSIUM CHLORIDE 20 MEQ/15ML (10%) PO SOLN
20.0000 meq | Freq: Once | ORAL | Status: AC
  Administered 2014-06-07: 20 meq
  Filled 2014-06-07: qty 15

## 2014-06-07 MED ORDER — LACTULOSE 10 GM/15ML PO SOLN
30.0000 g | Freq: Once | ORAL | Status: AC
  Administered 2014-06-07: 30 g
  Filled 2014-06-07: qty 45

## 2014-06-07 NOTE — Progress Notes (Signed)
NUTRITION FOLLOW UP  Intervention:   If medically able, consider addition of prokinetic agent such as Reglan.   When medically able, recommend initiate Vital 1.5 @ 10 ml/hr via peg tube and advance to goal rate of 40 ml/hr.  30 ml Prostat BID.    Tube feeding regimen provides 1640 kcal (97% of needs), 95 grams of protein, and 730 ml of H2O.    RD to continue to monitor  Nutrition Dx:   Inadequate oral intake related to inability to eat as evidenced by NPO; ongoing  Goal:   Pt to meet >/= 90% of their estimated nutrition needs; not met  Monitor:   TF initiation and tolerance, weight trend, vent status/settings, labs  Assessment:   24 y.o with long admission in 04/2014 for ARDS, tstomy decannulated 04/27/14, PEG in situ re admitted 4/17 for altered mental status, which improved. Intent was to remove PEG on 4/22. He developed progressive AKI. PCCM consulted for acute resp acidosis on 4/20 after swallow study ? aspiration PMH of club foot, hemiatrophy of RLE, Klippel Feil Syndrome, scoliosis s/p harrington rod surgery (2003), Restrictive lung disease, & Tourette's syndrome / Tic disorder  -Intubated 4/20 -Failed self-extubation 4/23 -Bradycardiac arrest triggered by severe agitation during mouth care with approx 5 mins chest compressions and 2 amps epi 4/24  - Pt with 13 lb wt loss in one month. (9%- significant for time frame) - No signs of fat or muscle wasting. - Suspect some form of malnutrition, but pt does not meet criteria at this time.    4/29: - Pt s/p trach placement 4/27.  - Pt with complaints of abdominal distension. MD decreased TF rate to 40 mL/hr.  Damaris Schooner with MD- will change formula. MD asked to stay at 40 mL/hr or below.  - Pt having bowel movements. - Pt in + fl balance. Admission weight used for TF calculations.  - Will change TF formula to better fit pt's nutritional needs and promote tolerance.   5/2: -Per RN, pt's TF held d/t stomach distention and  intolerance  -MD considering Reglan if residuals remain high  Patient is currently intubated on ventilator support MV: 8.8 L/min Temp (24hrs), Avg:98.2 F (36.8 C), Min:97.2 F (36.2 C), Max:99.1 F (37.3 C)  Propofol: none  Labs and medications reviewed  Height: Ht Readings from Last 1 Encounters:  05/29/14 $RemoveB'4\' 11"'[id number]$  (1.499 m)    Weight Status:   Wt Readings from Last 1 Encounters:  06/03/14 170 lb 10.2 oz (77.4 kg)    Re-estimated needs (5/2):  Kcal: 1828 Protein: 90-105 g Fluid: 1.8 L/day  Skin: closed incision on neck  Diet Order:     Intake/Output Summary (Last 24 hours) at 06/07/14 1422 Last data filed at 06/07/14 1200  Gross per 24 hour  Intake 4774.03 ml  Output   5665 ml  Net -890.97 ml    Last BM: 5/2   Labs:   Recent Labs Lab 06/04/14 0500 06/05/14 0425 06/05/14 1300 06/06/14 0640 06/07/14 0450  NA 145 152*  --  151* 147*  K 3.3* 3.5  --  4.0 3.2*  CL 106 106  --  107 106  CO2 28 32  --  31 32  BUN 32* 36*  --  39* 35*  CREATININE 6.98* 6.26*  --  5.12* 4.35*  CALCIUM 7.7* 8.4  --  6.8* 7.9*  MG 1.9  --  2.0 2.0 1.9  PHOS 7.2*  --   --  7.0* 6.3*  GLUCOSE 111*  108*  --  133* 120*    CBG (last 3)   Recent Labs  06/07/14 0452 06/07/14 0738 06/07/14 1139  GLUCAP 112* 94 123*    Scheduled Meds: . antiseptic oral rinse  7 mL Mouth Rinse QID  . bacitracin  1 application Topical 3 times per day  . budesonide (PULMICORT) nebulizer solution  0.5 mg Nebulization BID  . chlorhexidine  15 mL Mouth Rinse BID  . dexmethylphenidate  20 mg Oral QAC breakfast  . docusate  100 mg Per Tube BID  . free water  200 mL Per Tube Q6H  . heparin subcutaneous  5,000 Units Subcutaneous 3 times per day  . insulin aspart  0-9 Units Subcutaneous 6 times per day  . ipratropium-albuterol  3 mL Nebulization Q6H  . isosorbide-hydrALAZINE  1 tablet Oral BID  . pantoprazole sodium  40 mg Per Tube Q1200  . QUEtiapine  200 mg Oral BID  . sodium chloride  3  mL Intravenous Q12H  . valproate sodium  1,000 mg Intravenous Q12H    Continuous Infusions: . dexmedetomidine 1.1 mcg/kg/hr (06/07/14 1135)  . dextrose 150 mL/hr at 06/06/14 1823  . fentaNYL infusion INTRAVENOUS 200 mcg/hr (06/07/14 1335)    Clayton Bibles, MS, RD, LDN Pager: [phone] After Hours Pager: [phone]

## 2014-06-07 NOTE — Progress Notes (Signed)
PULMONARY / CRITICAL CARE MEDICINE   Name: Aaron Aaron MRN: [id number] DOB: 12-13-1990    ADMISSION DATE:  05/15/2014 CONSULTATION DATE:  06/07/2014  REFERRING MD :  Aaron Aaron  CHIEF COMPLAINT:  Resp Distress  INITIAL PRESENTATION: 24 y.o with long admission in 04/2014 for ARDS, tstomy decannulated 04/27/14, PEG in situ re-admitted 4/17 for altered mental status, which improved. Intent was to remove PEG on 4/22. He developed progressive AKI. PCCM consulted for acute resp acidosis on 4/20 after swallow study with ? Aspiration.   PMH - club foot, hemiatrophy of RLE, Klippel Feil Syndrome, scoliosis s/p harrington rod surgery (2003), Restrictive lung physiology, Tourette's syndrome.  He is very functional & goes to [facility], lives with mom.  STUDIES:  4/18 CT chest/ abd/pelvis:  BLL atx/ pna 4/24 Echocardiogram (performed prior to bradycardic arrest): Moderate LVH, LVEF 30-35%, decreased from LVEF 50-55% 04/05/14  SIGNIFICANT EVENTS: 4/20  swallow eval >> failed 4/20 intubated for resp acidosis 4/23  Failed self extubation due to rapid and severe hypoxemia 4/24  bradycardic arrest triggered by severe agitation during mouth care with approx 5 mins chest compressions and 2 amps epi 4/25  Cardiology consult for LVEF 30-35%, started on bicarb gtt, ENT consulted for trach 4/27  trach per ENT  SUBJECTIVE:  Afebrile, awake on precedex 1.2 + 300 fent gtt, trying to write notes.  Pt c/o's abd pain.    VITAL SIGNS: Temp:  [97 F (36.1 C)-99 F (37.2 C)] 98.5 F (36.9 C) (05/02 0742) Pulse Rate:  [55-114] 95 (05/02 0600) Resp:  [18-24] 19 (05/02 0600) BP: (113-155)/(60-115) 139/83 mmHg (05/02 0600) SpO2:  [94 %-100 %] 94 % (05/02 0900) FiO2 (%):  [30 %] 30 % (05/02 0900)   HEMODYNAMICS:     VENTILATOR SETTINGS: Vent Mode:  [-] PRVC FiO2 (%):  [30 %] 30 % Set Rate:  [20 bmp] 20 bmp Vt Set:  [450 mL] 450 mL PEEP:  [5 cmH20] 5 cmH20 Plateau Pressure:  [35 cmH20-44 cmH20] 36 cmH20   INTAKE /  OUTPUT:  Intake/Output Summary (Last 24 hours) at 06/07/14 0928 Last data filed at 06/07/14 0600  Gross per 24 hour  Intake 4359.29 ml  Output   4790 ml  Net -430.71 ml    PHYSICAL EXAMINATION: Gen: awake on vent, intermittently agitated HENT: NCAT, trach in place, trach in place PULM: decreased breath sounds left base, othwerwise clear CV: RRR, no clear mgr GI: distended, bsx4 hypoactive, PEG c/d/i Derm: no breakdown MSK: atrophy of bilateral legs, normal bulk/tone arms with pitting edema Neuro: Awake, trying to write notes, MAE  LABS:  CBC  Recent Labs Lab 06/04/14 0500 06/06/14 0640 06/07/14 0450  WBC 8.7 5.2 6.3  HGB 10.4* 10.8* 10.5*  HCT 33.4* 34.2* 33.8*  PLT 296 365 332   Coag's No results for input(s): APTT, INR in the last 168 hours.   BMET  Recent Labs Lab 06/05/14 0425 06/06/14 0640 06/07/14 0450  NA 152* 151* 147*  K 3.5 4.0 3.2*  CL 106 107 106  CO2 32 31 32  BUN 36* 39* 35*  CREATININE 6.26* 5.12* 4.35*  GLUCOSE 108* 133* 120*   Electrolytes  Recent Labs Lab 06/04/14 0500 06/05/14 0425 06/05/14 1300 06/06/14 0640 06/07/14 0450  CALCIUM 7.7* 8.4  --  6.8* 7.9*  MG 1.9  --  2.0 2.0 1.9  PHOS 7.2*  --   --  7.0* 6.3*   Sepsis Markers No results for input(s): LATICACIDVEN, PROCALCITON, O2SATVEN in the last 168 hours.  ABG  Recent Labs Lab 05/31/14 1030 06/01/14 0900  PHART 7.126* 7.345*  PCO2ART 53.9* 35.6  PO2ART 130.0* 135*   Liver Enzymes  Recent Labs Lab 2014-07-03 0500 06/04/14 0500 06/06/14 0640 06/07/14 0450  AST 13 21  --   --   ALT 12 15  --   --   ALKPHOS 45 52  --   --   BILITOT 0.4 0.5  --   --   ALBUMIN 2.3* 2.8* 2.6* 2.6*   Cardiac Enzymes  Recent Labs Lab 05/31/14 1610 05/31/14 2200 06/01/14 0411  TROPONINI 0.18* 0.18* 0.17*   Glucose  Recent Labs Lab 06/06/14 1220 06/06/14 1741 06/06/14 2010 06/07/14 0020 06/07/14 0452 06/07/14 0738  GLUCAP 89 151* 100* 125* 112* 94    CXR:   4/29 >> tubes in good position, LLL infiltrate KUB:  4/29 >> no definite evidence of bowel obstruction   ASSESSMENT / PLAN:  PULMONARY OETT 4/20 >> 4/27  Trach 4/27 >>  A: Acute Hypercarbic  Resp Failure - in setting of HCAP and renal failure, improving 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 Status - re-do tracheostomy by ENT 4/27 Wheezing - improved with inhaled steroid P:   SBT/WUA daily No plans for decannulation VAP prevention Pulmicort + Duoneb Pulmonary hygiene as able, mobilize Trend CXR intermittently   CARDIOVASCULAR R IJ CVL 4/20 >>  A:  Persistent hypotension-  4/23, likely due to acidosis, resolved Bradycardic arrest 4/24 Cardiomyopathy - new finding by Echo 4/24, presumably due to acidosis P:  Bidil per cardiology recs Place PICC when OK by renal ICU monitoring   RENAL A:   AKI - nonoliguric presumably ATN secondary to IV contrast vs ischemic ATN,  improving Anasarca - due to volume administered,  improved Hypokalemia Hypernatremia  P:   D5 @ 150 ml/hr Free water, 200ml Q6 F/u renal recs Hold further lasix Replace electrolytes as indicated   GASTROINTESTINAL A:   Chronic G tube Bloating + High Gastric Residuals, new 4/29> likely volume of tube feeds is too high, KUB images reviewed, OK Constipation Protein Calorie Malnutrition - albumin 2.6 P:   SUP: enteral PPI Cont TFs, decrease volume to 1540ml/hr PRN dulcolax and miralax 4/23 Consider reglan Repeat KUB 5/2 to ensure no developing ileus  HEMATOLOGIC A:   Anemia - suspect related to critical illness P:  DVT px: SQ heparin Monitor CBC intermittently Transfuse per usual ICU guidelines   INFECTIOUS A:   HCAP - on admission, resolved P:   Monitor fever curve / WBC  ENDOCRINE A:   Hyperglycemia without prior dx of DM Hypoglycemia 4/27 due to decreased po intake P:   Sensitive SSI D5W as above  NEUROLOGIC A:   Klippel Feil Syndrome ADD Severe agitation - causing  vent asynchrony and self extubation, history of Tourette's, ADHD, had EPS reaction to haldol last hospital stay, Depakote helping; precedex appears to be ineffective for him  P:   RASS goal: 0 Continue fentanyl gtt, prn versed, precedex gtt Continue Focalin  Continue depakote (helped during last hospitalization as well for ICU delirium)  FAMILY  - Updates: Called mother Christy Gentles([name] [phone], Salena Saner [phone]), no answer and left message  Canary BrimBrandi Jasmin Trumbull, NP-C [facility] Pgr: [phone] or [phone]

## 2014-06-07 NOTE — Progress Notes (Signed)
[facility] Progress Note   Subjective: 4.9L UOP yesterday, and creat down  Filed Vitals:   06/07/14 1400 06/07/14 1500 06/07/14 1549 06/07/14 1600  BP: 148/79 144/80  185/104  Pulse:  92  88  Temp:   97.4 F (36.3 C)   TempSrc:   Axillary   Resp: 13 20  20   Height:      Weight:      SpO2:  98%  98%   Exam: Gen: Comfortable on the ventilator via tracheostomy CVS: Pulse regular in rate and rhythm Resp: Clear to auscultation, no rales Abd: Soft, obese, tympanitic, bowel sounds normal Ext: Right LE hemiatrophy, bilat 2+ UE pitting edema        Assessment: 1. AKI due to ATN from contrast and/or ischemia; continues to improve, auto-diuresing is appropriate with edema/ vol excess.  2. VDRF 3. Hypernatremia - getting D5W w improvement 4. Hypokalemia - replacing  Plan - no further suggestions, will sign off. Please call as needed.     Vinson Moselleob Chong Wojdyla MD  pager [phone]    cell [phone]  06/07/2014, 4:27 PM     Recent Labs Lab 06/04/14 0500 06/05/14 0425 06/06/14 0640 06/07/14 0450  NA 145 152* 151* 147*  K 3.3* 3.5 4.0 3.2*  CL 106 106 107 106  CO2 28 32 31 32  GLUCOSE 111* 108* 133* 120*  BUN 32* 36* 39* 35*  CREATININE 6.98* 6.26* 5.12* 4.35*  CALCIUM 7.7* 8.4 6.8* 7.9*  PHOS 7.2*  --  7.0* 6.3*    Recent Labs Lab 09-09-2014 0500 06/04/14 0500 06/06/14 0640 06/07/14 0450  AST 13 21  --   --   ALT 12 15  --   --   ALKPHOS 45 52  --   --   BILITOT 0.4 0.5  --   --   PROT 5.1* 6.2  --   --   ALBUMIN 2.3* 2.8* 2.6* 2.6*    Recent Labs Lab 06/01/14 0411 09-09-2014 0500 06/03/14 0500 06/04/14 0500 06/06/14 0640 06/07/14 0450  WBC 4.4 3.8* 5.2 8.7 5.2 6.3  NEUTROABS 2.6 2.1 3.3  --   --   --   HGB 10.7* 11.0* 11.0* 10.4* 10.8* 10.5*  HCT 34.5* 34.4* 35.8* 33.4* 34.2* 33.8*  MCV 94.5 93.0 93.0 92.0 93.4 93.4  PLT 227 233 259 296 365 332   . antiseptic oral rinse  7 mL Mouth Rinse QID  . bacitracin  1 application Topical 3 times per  day  . budesonide (PULMICORT) nebulizer solution  0.5 mg Nebulization BID  . chlorhexidine  15 mL Mouth Rinse BID  . dexmethylphenidate  20 mg Oral QAC breakfast  . docusate  100 mg Per Tube BID  . free water  200 mL Per Tube Q6H  . heparin subcutaneous  5,000 Units Subcutaneous 3 times per day  . insulin aspart  0-9 Units Subcutaneous 6 times per day  . ipratropium-albuterol  3 mL Nebulization Q6H  . isosorbide-hydrALAZINE  1 tablet Oral BID  . pantoprazole sodium  40 mg Per Tube Q1200  . QUEtiapine  200 mg Oral BID  . sodium chloride  3 mL Intravenous Q12H  . valproate sodium  1,000 mg Intravenous Q12H   . dexmedetomidine 1 mcg/kg/hr (06/07/14 1602)  . dextrose 150 mL/hr at 06/07/14 1606  . fentaNYL infusion INTRAVENOUS 175 mcg/hr (06/07/14 1603)   acetaminophen **OR** [DISCONTINUED] acetaminophen, bisacodyl, fentaNYL (SUBLIMAZE) injection, hydrALAZINE, midazolam, [DISCONTINUED] ondansetron **OR** ondansetron (ZOFRAN) IV, polyethylene glycol, sodium chloride

## 2014-06-07 NOTE — Progress Notes (Signed)
24 year old functional gentleman with a history of club foot, hemiatrophy of RLE, Klippel Feil Syndrome, scoliosis s/p harrington rod surgery (2003), restrictive lung physiology, and Tourette's syndrome. He is status post a prior admission with acute respiratory failure secondary to pneumonia and had a complicated hospital course with ARDS, delirium, acute kidney injury, and he required prolonged ventilatory support and underwent tracheostomy. Following discharge, he was readmitted on 05/20/2014 with worsening mental status and respiratory failure requiring reintubation. He has developed worsening renal function and hypotension. Yesterday at 10:19 AM while being suctioned and getting mouth care he developed significant bradycardia with heart rates dropping to 39 bpm, with apparrent sinus bradycardia. Due to posible possible PEA transient CPR was administered. Heart rate ultimately improved. He has not had any further significant bradycardia arrhythmia since. Question if initial episode mediated by a vagal response to suctioning and mouth care. An echo Doppler study done yesterday showed moderate left ventricular hypertrophy with moderately severe new LV dysfunction with an ejection fraction of 30-35% with diffuse hypokinesis. (normal LVEF in 03/2014)  his troponin elevation is flat and most probably sec to CHF and acute renal failure.   No ACEI/ARB, once his BP improves we will start BiDil.   Subjective: Shakes his head no to any pain, resting comfortably.  Objective: Vital signs in last 24 hours: Temp:  [97.2 F (36.2 C)-99 F (37.2 C)] 98.5 F (36.9 C) (05/02 0742) Pulse Rate:  [55-114] 95 (05/02 0600) Resp:  [18-24] 19 (05/02 0600) BP: (113-155)/(60-115) 139/83 mmHg (05/02 0600) SpO2:  [94 %-100 %] 97 % (05/02 0918) FiO2 (%):  [30 %] 30 % (05/02 0918) Weight change:  Last BM Date: 06/05/14 Intake/Output from previous day: -104 05/01 0701 - 05/02 0700 In: 4785.7  [I.V.:4361.4; NG/GT:284.3; IV Piggyback:120] Out: 4990 [Urine:4990] Intake/Output this shift:    PE: General:Pleasant affect, NAD Skin:Warm and dry, brisk capillary refill HEENT:normocephalic, sclera clear, mucus membranes moist Heart:S1S2 RRR without murmur, gallup, rub or click Lungs:clear, ant,  without rales, rhonchi, or wheezes BJY:NWGN, non tender, + BS, do not palpate liver spleen or masses Ext:no lower ext edema, 2+ pedal pulses, 2+ radial pulses, SCD stockings in place Neuro:alert  follows commands, + facial symmetry Tele:   ST with Qtc 0.47 yesterday  Lab Results:  Recent Labs  06/06/14 0640 06/07/14 0450  WBC 5.2 6.3  HGB 10.8* 10.5*  HCT 34.2* 33.8*  PLT 365 332   BMET  Recent Labs  06/06/14 0640 06/07/14 0450  NA 151* 147*  K 4.0 3.2*  CL 107 106  CO2 31 32  GLUCOSE 133* 120*  BUN 39* 35*  CREATININE 5.12* 4.35*  CALCIUM 6.8* 7.9*   No results for input(s): TROPONINI in the last 72 hours.  Invalid input(s): CK, MB  Lab Results  Component Value Date   TRIG 174* 04/06/2014   No results found for: HGBA1C   Lab Results  Component Value Date   TSH 1.679 05/20/2014    Hepatic Function Panel  Recent Labs  06/07/14 0450  ALBUMIN 2.6*   No results for input(s): CHOL in the last 72 hours. No results for input(s): PROTIME in the last 72 hours.     Studies/Results: ECHO: Study Conclusions  - Left ventricle: The cavity size was normal. Wall thickness was increased in a pattern of moderate LVH. Systolic function was moderately to severely reduced. The estimated ejection fraction was in the range of 30% to 35%. Diffuse hypokinesis. Left ventricular diastolic function parameters were normal.  Medications: I have reviewed the patient's current medications. Scheduled Meds: . antiseptic oral rinse  7 mL Mouth Rinse QID  . bacitracin  1 application Topical 3 times per day  . budesonide (PULMICORT) nebulizer solution  0.5 mg  Nebulization BID  . chlorhexidine  15 mL Mouth Rinse BID  . dexmethylphenidate  20 mg Oral QAC breakfast  . docusate  100 mg Per Tube BID  . free water  200 mL Per Tube Q6H  . heparin subcutaneous  5,000 Units Subcutaneous 3 times per day  . insulin aspart  0-9 Units Subcutaneous 6 times per day  . ipratropium-albuterol  3 mL Nebulization Q6H  . pantoprazole sodium  40 mg Per Tube Q1200  . potassium chloride  20 mEq Per Tube Once  . QUEtiapine  200 mg Oral BID  . sodium chloride  3 mL Intravenous Q12H  . valproate sodium  1,000 mg Intravenous Q12H   Continuous Infusions: . dexmedetomidine 1.2 mcg/kg/hr (06/07/14 0715)  . dextrose 150 mL/hr at 06/06/14 1823  . fentaNYL infusion INTRAVENOUS 300 mcg/hr (06/07/14 0529)   PRN Meds:.acetaminophen **OR** [DISCONTINUED] acetaminophen, bisacodyl, fentaNYL (SUBLIMAZE) injection, hydrALAZINE, midazolam, [DISCONTINUED] ondansetron **OR** ondansetron (ZOFRAN) IV, polyethylene glycol, sodium chloride  Assessment/Plan: 1. Transient bradycardia in the setting of aspiration- no further episodes noted 2. 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 Though may not be candidate with pul. Issues, no ACE or ARB with current kidney failure 3. Recurrent respiratory failure- intubated 4. Acute renal failure -SCr 7.43--decreasing today 4.35  5. Klippel Feil Syndrome 6. Restrictive lung physiology- re-do trach on 4/27   7. HCAP- resolved  8. Chronic G tube 9. Hyperglycemia without prior DM      LOS: 15 days   Time spent with pt. :15 minutes. [facility] CenterNGOLD,LAURA R  Nurse Practitioner Certified Pager [phone] or after 5pm and on weekends call [phone] 06/07/2014, 10:09 AM   History and all data above reviewed.  Patient examined.  I agree with the findings as above.  Unable to communicate with the patient.  The patient exam reveals COR:RRR  ,  Lungs: Decreased breath sounds  ,  Abd: Positive bowel sounds, no rebound no guarding, Ext  Diffuse upper extremity edema  .  All available labs, radiology testing, previous records reviewed. Agree with documented assessment and plan. Newly reduced EF:  Question related to acute events.  Given CKD no ACE/ARB.  Given bradycardia (likely vagal episode) I will start with BiDil.    Fayrene FearingJames Albertina Leise  11:49 AM  06/07/2014

## 2014-06-08 ENCOUNTER — Inpatient Hospital Stay (HOSPITAL_COMMUNITY): Payer: Medicaid Other

## 2014-06-08 LAB — RENAL FUNCTION PANEL
Albumin: 2.8 g/dL — ABNORMAL LOW (ref 3.5–5.0)
Anion gap: 10 (ref 5–15)
BUN: 29 mg/dL — ABNORMAL HIGH (ref 6–20)
CALCIUM: 8 mg/dL — AB (ref 8.9–10.3)
CO2: 30 mmol/L (ref 22–32)
Chloride: 104 mmol/L (ref 101–111)
Creatinine, Ser: 3.94 mg/dL — ABNORMAL HIGH (ref 0.61–1.24)
GFR calc Af Amer: 23 mL/min — ABNORMAL LOW (ref >60)
GFR, EST NON AFRICAN AMERICAN: 20 mL/min — AB (ref >60)
Glucose, Bld: 138 mg/dL — ABNORMAL HIGH (ref 70–99)
Phosphorus: 6.8 mg/dL — ABNORMAL HIGH (ref 2.5–4.6)
Potassium: 3.4 mmol/L — ABNORMAL LOW (ref 3.5–5.1)
Sodium: 144 mmol/L (ref 135–145)

## 2014-06-08 LAB — CBC
HEMATOCRIT: 35.4 % — AB (ref 39.0–52.0)
Hemoglobin: 10.7 g/dL — ABNORMAL LOW (ref 13.0–17.0)
MCH: 28.5 pg (ref 26.0–34.0)
MCHC: 30.2 g/dL (ref 30.0–36.0)
MCV: 94.4 fL (ref 78.0–100.0)
Platelets: 311 10*3/uL (ref 150–400)
RBC: 3.75 MIL/uL — ABNORMAL LOW (ref 4.22–5.81)
RDW: 14.7 % (ref 11.5–15.5)
WBC: 18.7 10*3/uL — ABNORMAL HIGH (ref 4.0–10.5)

## 2014-06-08 LAB — GLUCOSE, CAPILLARY
GLUCOSE-CAPILLARY: 107 mg/dL — AB (ref 70–99)
GLUCOSE-CAPILLARY: 113 mg/dL — AB (ref 70–99)
GLUCOSE-CAPILLARY: 116 mg/dL — AB (ref 70–99)
GLUCOSE-CAPILLARY: 130 mg/dL — AB (ref 70–99)
Glucose-Capillary: 124 mg/dL — ABNORMAL HIGH (ref 70–99)
Glucose-Capillary: 116 mg/dL — ABNORMAL HIGH (ref 70–99)

## 2014-06-08 LAB — MAGNESIUM: MAGNESIUM: 1.6 mg/dL — AB (ref 1.7–2.4)

## 2014-06-08 MED ORDER — LABETALOL HCL 5 MG/ML IV SOLN
10.0000 mg | INTRAVENOUS | Status: DC | PRN
  Administered 2014-06-09 – 2014-06-10 (×2): 10 mg via INTRAVENOUS
  Filled 2014-06-08 (×2): qty 4

## 2014-06-08 MED ORDER — MAGNESIUM SULFATE 2 GM/50ML IV SOLN
2.0000 g | Freq: Once | INTRAVENOUS | Status: AC
  Administered 2014-06-08: 2 g via INTRAVENOUS
  Filled 2014-06-08: qty 50

## 2014-06-08 MED ORDER — METOPROLOL TARTRATE 1 MG/ML IV SOLN
2.5000 mg | INTRAVENOUS | Status: DC | PRN
  Administered 2014-06-08: 5 mg via INTRAVENOUS

## 2014-06-08 MED ORDER — METOPROLOL TARTRATE 1 MG/ML IV SOLN
INTRAVENOUS | Status: AC
  Filled 2014-06-08: qty 5

## 2014-06-08 NOTE — Progress Notes (Signed)
eLink Physician-Brief Progress Note Patient Name: [name] DOB: 03/18/1990 MRN: [id number]   Date of Service  06/08/2014  HPI/Events of Note  Tachycardia with HR in the 160s.  Has experienced tachycardia but aggravated by hydralazine.  eICU Interventions  Plan: D/C prn hydralazine PRN Lopressor for HR control PRN labetalol for hypertension     Intervention Category Intermediate Interventions: Arrhythmia - evaluation and management;Hypertension - evaluation and management  DETERDING,ELIZABETH 06/08/2014, 12:26 AM

## 2014-06-08 NOTE — Progress Notes (Signed)
PULMONARY / CRITICAL CARE MEDICINE   Name: [name] MRN: [id number] DOB: 01/29/1991    ADMISSION DATE:  05/17/2014 CONSULTATION DATE:  06/08/2014  REFERRING MD :  [name]  CHIEF COMPLAINT:  Resp Distress  INITIAL PRESENTATION: 24 y.o with long admission in 04/2014 for ARDS, tstomy decannulated 04/27/14, PEG in situ re-admitted 4/17 for altered mental status, which improved. Intent was to remove PEG on 4/22. He developed progressive AKI. PCCM consulted for acute resp acidosis on 4/20 after swallow study with ? Aspiration.   PMH - club foot, hemiatrophy of RLE, Klippel Feil Syndrome, scoliosis s/p harrington rod surgery (2003), Restrictive lung physiology, Tourette's syndrome.  He is very functional & [facility] [employer], lives with mom.  STUDIES:  4/18 CT chest/ abd/pelvis:  BLL atx/ pna 4/24 Echocardiogram (performed prior to bradycardic arrest): Moderate LVH, LVEF 30-35%, decreased from LVEF 50-55% 04/05/14  SIGNIFICANT EVENTS: 4/20  swallow eval >> failed 4/20 intubated for resp acidosis 4/23  Failed self extubation due to rapid and severe hypoxemia 4/24  bradycardic arrest triggered by severe agitation during mouth care with approx 5 mins chest compressions and 2 amps epi 4/25  Cardiology consult for LVEF 30-35%, started on bicarb gtt, ENT consulted for trach 4/27  trach per ENT 5/02  Seroquel increased, fent/precedex gtt's weaned.  BMx1  SUBJECTIVE:  Fent/precedex gtt's weaned 75 mcg/0.6, large BM overnight, tmax 101.2.  More calm.  Tachy 160's overnight, thought related to IV hydralazine  VITAL SIGNS: Temp:  [97.4 F (36.3 C)-101.2 F (38.4 C)] 98.4 F (36.9 C) (05/03 0800) Pulse Rate:  [71-124] 109 (05/03 0700) Resp:  [13-25] 20 (05/03 0700) BP: (94-200)/(32-129) 129/50 mmHg (05/03 0700) SpO2:  [94 %-100 %] 96 % (05/03 0700) FiO2 (%):  [30 %] 30 % (05/03 0523) Weight:  [162 lb 0.6 oz (73.5 kg)] 162 lb 0.6 oz (73.5 kg) (05/03 0500)   HEMODYNAMICS:     VENTILATOR  SETTINGS: Vent Mode:  [-] PRVC FiO2 (%):  [30 %] 30 % Set Rate:  [20 bmp] 20 bmp Vt Set:  [450 mL] 450 mL PEEP:  [5 cmH20] 5 cmH20 Plateau Pressure:  [34 cmH20-47 cmH20] 44 cmH20   INTAKE / OUTPUT:  Intake/Output Summary (Last 24 hours) at 06/08/14 0817 Last data filed at 06/08/14 0700  Gross per 24 hour  Intake 4856.87 ml  Output   4800 ml  Net  56.87 ml    PHYSICAL EXAMINATION: Gen: chronically ill young adult in NAD HENT: NCAT, trach in place, c/d/i PULM: even/non-labored on vent, lungs bilaterally coarse CV: RRR, no clear mgr GI: distended, bsx4 hypoactive, PEG c/d/i Derm: no breakdown MSK: atrophy of bilateral legs, normal bulk/tone arms with pitting edema Neuro: Awake, trying to write notes, MAE  LABS:  CBC  Recent Labs Lab 06/06/14 0640 06/07/14 0450 06/08/14 0545  WBC 5.2 6.3 18.7*  HGB 10.8* 10.5* 10.7*  HCT 34.2* 33.8* 35.4*  PLT 365 332 311   Coag's No results for input(s): APTT, INR in the last 168 hours.   BMET  Recent Labs Lab 06/06/14 0640 06/07/14 0450 06/08/14 0513  NA 151* 147* 144  K 4.0 3.2* 3.4*  CL 107 106 104  CO2 31 32 30  BUN 39* 35* 29*  CREATININE 5.12* 4.35* 3.94*  GLUCOSE 133* 120* 138*   Electrolytes  Recent Labs Lab 06/06/14 0640 06/07/14 0450 06/08/14 0513 06/08/14 0545  CALCIUM 6.8* 7.9* 8.0*  --   MG 2.0 1.9  --  1.6*  PHOS 7.0* 6.3* 6.8*  --  Sepsis Markers No results for input(s): LATICACIDVEN, PROCALCITON, O2SATVEN in the last 168 hours.   ABG  Recent Labs Lab 06/01/14 0900  PHART 7.345*  PCO2ART 35.6  PO2ART 135*   Liver Enzymes  Recent Labs Lab 05/08/2014 0500 06/04/14 0500 06/06/14 0640 06/07/14 0450 06/08/14 0513  AST 13 21  --   --   --   ALT 12 15  --   --   --   ALKPHOS 45 52  --   --   --   BILITOT 0.4 0.5  --   --   --   ALBUMIN 2.3* 2.8* 2.6* 2.6* 2.8*   Cardiac Enzymes No results for input(s): TROPONINI, PROBNP in the last 168 hours. 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 Status - re-do tracheostomy by ENT 4/27 Wheezing - improved with inhaled steroid P:   SBT/WUA daily No plans for decannulation VAP prevention Pulmicort + Duoneb Pulmonary hygiene as able, mobilize Trend CXR intermittently   CARDIOVASCULAR R IJ CVL 4/20 >>  A:  Tachycardia / SVT - overnight 5/3 Persistent hypotension-  4/23, likely due to acidosis, resolved Bradycardic arrest 4/24  Cardiomyopathy - new finding by Echo 4/24, presumably due to acidosis P:  Bidil per cardiology recs Place PICC when OK by renal ICU monitoring  PRN lopressor, labetalol  RENAL A:   AKI - nonoliguric presumably ATN secondary to IV contrast vs ischemic ATN,  improving Anasarca - due to volume administered,  improved Hypokalemia Hypernatremia  Hypomagnesemia  P:   Reduce D5 @ 75 ml/hr with CXR appearance (5/3) Free water, Q6 Renal signed off 5/2 Hold further lasix for now Replace electrolytes as indicated  2 gm Mag x1 5/3  GASTROINTESTINAL A:   Chronic G tube Bloating + High Gastric Residuals, new 4/29> likely volume of tube feeds is too high, KUB images reviewed from 5/2, OK Constipation Protein Calorie Malnutrition - albumin 2.6 P:   SUP: enteral PPI Cont TFs, decrease volume to 63ml/hr PRN dulcolax and miralax 4/23 Consider reglan Repeat KUB 5/2 to ensure no developing ileus  HEMATOLOGIC A:   Anemia - suspect related to critical illness P:  DVT px: SQ heparin Monitor CBC intermittently Transfuse per usual  ICU guidelines   INFECTIOUS A:   HCAP - on admission, resolved Fever - tmax 101.2 on 5/3 P:   Monitor fever curve / WBC Re-culture if temp rises above 101.5  ENDOCRINE A:   Hyperglycemia without prior dx of DM Hypoglycemia 4/27 due to decreased po intake P:   Sensitive SSI D5W as above  NEUROLOGIC A:   Klippel Feil Syndrome ADD Severe agitation - causing vent asynchrony and self extubation, history of Tourette's, ADHD, had EPS reaction to haldol last hospital stay, Depakote helping; precedex appears to be ineffective for him P:   RASS goal: 0 Continue fentanyl gtt, prn versed, precedex gtt Continue Focalin  Continue depakote (helped during last hospitalization as well for ICU delirium) Seroquel 200 mg BID, monitor response  FAMILY  - Updates:  Called mother 5/2 Christy Gentles [phone], Salena Saner [phone]), no answer and left message    Canary Brim, NP-C [facility] Pgr: [phone] or [phone]

## 2014-06-08 NOTE — Progress Notes (Signed)
24 year old functional gentleman with a history of club foot, hemiatrophy of RLE, Klippel Feil Syndrome, scoliosis s/p harrington rod surgery (2003), restrictive lung physiology, and Tourette's syndrome. He is status post a prior admission with acute respiratory failure secondary to pneumonia and had a complicated hospital course with ARDS, delirium, acute kidney injury, and he required prolonged ventilatory support and underwent tracheostomy. Following discharge, he was readmitted on 05/24/2014 with worsening mental status and respiratory failure requiring reintubation. He has developed worsening renal function and hypotension. Yesterday at 10:19 AM while being suctioned and getting mouth care he developed significant bradycardia with heart rates dropping to 39 bpm, with apparrent sinus bradycardia. Due to posible possible PEA transient CPR was administered. Heart rate ultimately improved. He has not had any further significant bradycardia arrhythmia since. Question if initial episode mediated by a vagal response to suctioning and mouth care. An echo Doppler study done yesterday showed moderate left ventricular hypertrophy with moderately severe new LV dysfunction with an ejection fraction of 30-35% with diffuse hypokinesis. (normal LVEF in 03/2014) his troponin elevation is flat and most probably sec to CHF and acute renal failure. No ACEI/ARB, once his BP improves we will start BiDil.    Subjective: Not responding -sedated  Objective: Vital signs in last 24 hours: Temp:  [97.4 F (36.3 C)-101.2 F (38.4 C)] 101.2 F (38.4 C) (05/03 0400) Pulse Rate:  [71-124] 109 (05/03 0700) Resp:  [13-25] 20 (05/03 0700) BP: (94-200)/(32-129) 129/50 mmHg (05/03 0700) SpO2:  [94 %-100 %] 96 % (05/03 0700) FiO2 (%):  [30 %] 30 % (05/03 0523) Weight:  [162 lb 0.6 oz (73.5 kg)] 162 lb 0.6 oz (73.5 kg) (05/03 0500) Weight change:  Last BM Date: 06/07/14 Intake/Output from previous day:  -310 (+19,734) wt stable at 162, overall up from 140 on admit 05/02 0701 - 05/03 0700 In: 5263.3 [I.V.:4798.3; NG/GT:200; IV Piggyback:120] Out: 5400 [Urine:5400] Intake/Output this shift:    PE: General:sedated, no responding Skin:Warm and dry, brisk capillary refill HEENT:normocephalic, sclera clear, mucus membranes moist Neck:  trach in place  Heart:S1S2 RRR without murmur, gallup, rub or click- muffled Lungs:clear, ant,  without rales, rhonchi, or wheezes EAV:WUJWJ, non tender, hypoactive + BS, do not palpate liver spleen or masses Ext:no lower ext edema, 2+ pedal pulses, 2+ radial pulses, scd stockings in place Neuro:sedated + facial symmetry Tele: ST to 162 during the night, today SR     Lab Results:  Recent Labs  06/07/14 0450 06/08/14 0545  WBC 6.3 18.7*  HGB 10.5* 10.7*  HCT 33.8* 35.4*  PLT 332 311   BMET  Recent Labs  06/07/14 0450 06/08/14 0513  NA 147* 144  K 3.2* 3.4*  CL 106 104  CO2 32 30  GLUCOSE 120* 138*  BUN 35* 29*  CREATININE 4.35* 3.94*  CALCIUM 7.9* 8.0*   No results for input(s): TROPONINI in the last 72 hours.  Invalid input(s): CK, MB  Lab Results  Component Value Date   TRIG 174* 04/06/2014   No results found for: HGBA1C   Lab Results  Component Value Date   TSH 1.679 06/01/2014    Hepatic Function Panel  Recent Labs  06/08/14 0513  ALBUMIN 2.8*   No results for input(s): CHOL in the last 72 hours. No results for input(s): PROTIME in the last 72 hours.     Studies/Results: Dg Chest Port 1 View  06/08/2014   CLINICAL DATA:  Respiratory failure  EXAM: PORTABLE CHEST - 1 VIEW  COMPARISON:  06/04/2014  FINDINGS: There is a tracheostomy. There is a right jugular central line with tip in the upper SVC. There is extensive spinal fixation hardware. There is unchanged cardiomegaly. Vascular and interstitial congestive changes persist, probably worsened. Retrocardiac left base opacity persists without significant interval  change.  IMPRESSION: Support equipment appears satisfactorily positioned.  Worsened congestive heart failure. Persistent left base consolidation.   Electronically Signed   By: Ellery Plunk M.D.   On: 06/08/2014 06:38   Dg Abd Portable 1v  06/07/2014   CLINICAL DATA:  Abdominal distension. Recurrent respiratory failure. Chronic gastrostomy tube.  EXAM: PORTABLE ABDOMEN - 1 VIEW  COMPARISON:  06/04/2014  FINDINGS: Again noted is a gastrostomy tube. Spinal rods throughout the thoracolumbar spine. There is a nonobstructive bowel gas pattern. Chronic deformity of the lower ribs. There is gas in the colon. Evidence for a rectal tube.  IMPRESSION: Nonobstructive bowel gas pattern.  Gastrostomy tube.   Electronically Signed   By: Richarda Overlie M.D.   On: 06/07/2014 11:55    Medications: I have reviewed the patient's current medications. Scheduled Meds: . antiseptic oral rinse  7 mL Mouth Rinse QID  . bacitracin  1 application Topical 3 times per day  . budesonide (PULMICORT) nebulizer solution  0.5 mg Nebulization BID  . chlorhexidine  15 mL Mouth Rinse BID  . dexmethylphenidate  20 mg Oral QAC breakfast  . docusate  100 mg Per Tube BID  . free water  200 mL Per Tube Q6H  . heparin subcutaneous  5,000 Units Subcutaneous 3 times per day  . insulin aspart  0-9 Units Subcutaneous 6 times per day  . ipratropium-albuterol  3 mL Nebulization Q6H  . isosorbide-hydrALAZINE  1 tablet Oral BID  . pantoprazole sodium  40 mg Per Tube Q1200  . QUEtiapine  200 mg Oral BID  . sodium chloride  3 mL Intravenous Q12H  . valproate sodium  1,000 mg Intravenous Q12H   Continuous Infusions: . dexmedetomidine 0.6 mcg/kg/hr (06/08/14 0721)  . dextrose 150 mL/hr at 06/07/14 1606  . fentaNYL infusion INTRAVENOUS 75 mcg/hr (06/08/14 0627)   PRN Meds:.acetaminophen **OR** [DISCONTINUED] acetaminophen, bisacodyl, fentaNYL (SUBLIMAZE) injection, labetalol, metoprolol, midazolam, [DISCONTINUED] ondansetron **OR** ondansetron  (ZOFRAN) IV, polyethylene glycol, sodium chloride  Assessment/Plan: 1. Transient bradycardia in the setting of aspiration- no further episodes noted though at rest HR 57 at times. 2. 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 3. Recurrent respiratory failure- intubated- chest xray with increased CHF CCM following  4. Acute renal failure -SCr 7.43--decreasing yesterday--> 4.35--> today 3.94  5. Klippel Feil Syndrome 6. Restrictive lung physiology- re-do trach on 4/27 per CCM 7. HCAP- resolved  8. Chronic G tube,  abd tense, KUB without acute issue WBC elevated  9. Hyperglycemia without prior DM  10. Anemia per CCM 11. Tachycardia, S.T up to 162.  last pm was restless and rec'd IV hydralazine. Thought tachy may be IV hydralazine it was stopped.  Also with fever at that time.  If continued with tachycardia could add low dose po BB- brady was in setting of aspiration.   12. Hypo magnesium, replacing by CCM    LOS: 16 days    [facility] R  Nurse Practitioner Certified Pager [phone] or after 5pm and on weekends call [phone] 06/08/2014, 8:02 AM  History and all data above reviewed.  Patient examined.  I agree with the findings as above. More awake with  less sedation.  Consequently his HR is increased.  The patient exam reveals COR:RRR  ,  Lungs: Decreased breath sounds  ,  Abd: Distended, Ext No edema  .  All available labs, radiology testing, previous records reviewed. Agree with documented assessment and plan. Cardiomyopathy:  I will likely start beta blocker tomorrow.  Started BiDil yesterday.    Fayrene FearingJames Orlie Cundari  1:13 PM  06/08/2014

## 2014-06-08 NOTE — Clinical Documentation Improvement (Signed)
Supporting Information:  I believe that his heart failure is acute (normal LVEF in 03/2014) in the settings of metabolic acidosis, acute kidney failure, his troponin elevation is flat and most probably sec to CHF and acute renal failure per 4/26 progress notes.   Labs: 4/26:  BNP: 460.8    Possible Clinical Condition: . Document acuity --Acute --Acute on Chronic . Document type --Diastolic --Systolic --Combined systolic and diastolic . Due to or associated with --Cardiac or other surgery --Hypertension --Valvular disease --Rheumatic heart disease Endocarditis (valvitis) Pericarditis Myocarditis --Other (specify)    Thank Gabriel CirriYou,  Corayma Cashatt Mathews-Bethea,RN,BSN,Clinical Documentation Specialist [phone] [email or web address]

## 2014-06-08 NOTE — Progress Notes (Signed)
Date:  Jun 08, 2014 Update in condition from chart: Remains on full vent support via Tracheostomy Will continue to follow for Case Management needs.

## 2014-06-09 ENCOUNTER — Inpatient Hospital Stay (HOSPITAL_COMMUNITY): Payer: Medicaid Other

## 2014-06-09 DIAGNOSIS — J189 Pneumonia, unspecified organism: Secondary | ICD-10-CM

## 2014-06-09 LAB — RENAL FUNCTION PANEL
Albumin: 2.9 g/dL — ABNORMAL LOW (ref 3.5–5.0)
Anion gap: 11 (ref 5–15)
BUN: 29 mg/dL — ABNORMAL HIGH (ref 6–20)
CO2: 34 mmol/L — ABNORMAL HIGH (ref 22–32)
Calcium: 8.7 mg/dL — ABNORMAL LOW (ref 8.9–10.3)
Chloride: 107 mmol/L (ref 101–111)
Creatinine, Ser: 3.76 mg/dL — ABNORMAL HIGH (ref 0.61–1.24)
GFR calc Af Amer: 24 mL/min — ABNORMAL LOW
GFR calc non Af Amer: 21 mL/min — ABNORMAL LOW
Glucose, Bld: 118 mg/dL — ABNORMAL HIGH (ref 70–99)
Phosphorus: 5.1 mg/dL — ABNORMAL HIGH (ref 2.5–4.6)
Potassium: 3.3 mmol/L — ABNORMAL LOW (ref 3.5–5.1)
Sodium: 152 mmol/L — ABNORMAL HIGH (ref 135–145)

## 2014-06-09 LAB — GLUCOSE, CAPILLARY
Glucose-Capillary: 104 mg/dL — ABNORMAL HIGH (ref 70–99)
Glucose-Capillary: 105 mg/dL — ABNORMAL HIGH (ref 70–99)
Glucose-Capillary: 120 mg/dL — ABNORMAL HIGH (ref 70–99)
Glucose-Capillary: 120 mg/dL — ABNORMAL HIGH (ref 70–99)
Glucose-Capillary: 130 mg/dL — ABNORMAL HIGH (ref 70–99)
Glucose-Capillary: 93 mg/dL (ref 70–99)

## 2014-06-09 LAB — URINE MICROSCOPIC-ADD ON

## 2014-06-09 LAB — URINALYSIS, ROUTINE W REFLEX MICROSCOPIC
Bilirubin Urine: NEGATIVE
GLUCOSE, UA: NEGATIVE mg/dL
Ketones, ur: NEGATIVE mg/dL
Nitrite: NEGATIVE
Protein, ur: 30 mg/dL — AB
Specific Gravity, Urine: 1.008 (ref 1.005–1.030)
UROBILINOGEN UA: 0.2 mg/dL (ref 0.0–1.0)
pH: 7.5 (ref 5.0–8.0)

## 2014-06-09 LAB — CBC
HCT: 33.9 % — ABNORMAL LOW (ref 39.0–52.0)
Hemoglobin: 10.4 g/dL — ABNORMAL LOW (ref 13.0–17.0)
MCH: 29.1 pg (ref 26.0–34.0)
MCHC: 30.7 g/dL (ref 30.0–36.0)
MCV: 94.7 fL (ref 78.0–100.0)
Platelets: 330 K/uL (ref 150–400)
RBC: 3.58 MIL/uL — ABNORMAL LOW (ref 4.22–5.81)
RDW: 15 % (ref 11.5–15.5)
WBC: 15.9 K/uL — ABNORMAL HIGH (ref 4.0–10.5)

## 2014-06-09 LAB — MAGNESIUM: Magnesium: 2.1 mg/dL (ref 1.7–2.4)

## 2014-06-09 MED ORDER — VANCOMYCIN HCL IN DEXTROSE 750-5 MG/150ML-% IV SOLN
750.0000 mg | INTRAVENOUS | Status: AC
  Administered 2014-06-09 – 2014-06-10 (×2): 750 mg via INTRAVENOUS
  Filled 2014-06-09 (×3): qty 150

## 2014-06-09 MED ORDER — POTASSIUM CHLORIDE 20 MEQ/15ML (10%) PO SOLN
20.0000 meq | Freq: Once | ORAL | Status: AC
  Administered 2014-06-09: 20 meq
  Filled 2014-06-09: qty 15

## 2014-06-09 MED ORDER — FREE WATER
250.0000 mL | Freq: Four times a day (QID) | Status: DC
  Administered 2014-06-09 – 2014-06-10 (×4): 250 mL

## 2014-06-09 MED ORDER — CARVEDILOL 3.125 MG PO TABS
3.1250 mg | ORAL_TABLET | Freq: Two times a day (BID) | ORAL | Status: DC
  Administered 2014-06-09 – 2014-06-10 (×2): 3.125 mg via ORAL
  Filled 2014-06-09 (×2): qty 1

## 2014-06-09 MED ORDER — FENTANYL CITRATE (PF) 100 MCG/2ML IJ SOLN
25.0000 ug | INTRAMUSCULAR | Status: DC | PRN
  Administered 2014-06-09: 50 ug via INTRAVENOUS
  Filled 2014-06-09: qty 2

## 2014-06-09 MED ORDER — FENTANYL CITRATE (PF) 100 MCG/2ML IJ SOLN
25.0000 ug | INTRAMUSCULAR | Status: DC | PRN
  Administered 2014-06-10: 100 ug via INTRAVENOUS
  Administered 2014-06-10: 50 ug via INTRAVENOUS
  Administered 2014-06-10: 100 ug via INTRAVENOUS
  Filled 2014-06-09 (×2): qty 2

## 2014-06-09 MED ORDER — METOPROLOL TARTRATE 1 MG/ML IV SOLN
2.5000 mg | INTRAVENOUS | Status: DC | PRN
  Administered 2014-06-09: 5 mg via INTRAVENOUS
  Administered 2014-06-09: 2.5 mg via INTRAVENOUS
  Administered 2014-06-09: 5 mg via INTRAVENOUS
  Administered 2014-06-09: 2.5 mg via INTRAVENOUS
  Administered 2014-06-10: 5 mg via INTRAVENOUS
  Filled 2014-06-09 (×5): qty 5

## 2014-06-09 MED ORDER — ISOSORB DINITRATE-HYDRALAZINE 20-37.5 MG PO TABS
1.0000 | ORAL_TABLET | Freq: Three times a day (TID) | ORAL | Status: DC
  Administered 2014-06-09 (×2): 1 via ORAL
  Filled 2014-06-09 (×5): qty 1

## 2014-06-09 NOTE — Progress Notes (Signed)
Pt HR has been increasing throughout the night. Now staying between 130-140 bpm. MD made aware of HR and d'cd medications from earlier in the day including fentanyl and precedex. MD specified to restart precedex.

## 2014-06-09 NOTE — Progress Notes (Signed)
24 year old functional gentleman with a history of club foot, hemiatrophy of RLE, Klippel Feil Syndrome, scoliosis s/p harrington rod surgery (2003), restrictive lung physiology, and Tourette's syndrome. He is status post a prior admission with acute respiratory failure secondary to pneumonia and had a complicated hospital course with ARDS, delirium, acute kidney injury, and he required prolonged ventilatory support and underwent tracheostomy. Following discharge, he was readmitted on 05/08/2014 with worsening mental status and respiratory failure requiring reintubation. He has developed worsening renal function and hypotension. Yesterday at 10:19 AM while being suctioned and getting mouth care he developed significant bradycardia with heart rates dropping to 39 bpm, with apparrent sinus bradycardia. Due to posible possible PEA transient CPR was administered. Heart rate ultimately improved. He has not had any further significant bradycardia arrhythmia since. Question if initial episode mediated by a vagal response to suctioning and mouth care. An echo Doppler study done yesterday showed moderate left ventricular hypertrophy with moderately severe new LV dysfunction with an ejection fraction of 30-35% with diffuse hypokinesis. (normal LVEF in 03/2014) his troponin elevation is flat and most probably sec to CHF and acute renal failure. No ACEI/ARB,  we started BiDil.    Subjective: Nauseated, motioning he will vomit, also +BM   Objective: Vital signs in last 24 hours: Temp:  [98.5 F (36.9 C)-103 F (39.4 C)] 100 F (37.8 C) (05/04 0824) Pulse Rate:  [95-145] 111 (05/04 0839) Resp:  [18-31] 24 (05/04 0839) BP: (113-185)/(22-143) 157/85 mmHg (05/04 0839) SpO2:  [90 %-100 %] 94 % (05/04 0839) FiO2 (%):  [30 %-100 %] 100 % (05/04 0908) Weight:  [146 lb 6.2 oz (66.4 kg)] 146 lb 6.2 oz (66.4 kg) (05/04 0444) Weight change: -15 lb 10.4 oz (-7.1 kg) Last BM Date:  06/08/14 Intake/Output from previous day: -4546.8  ( since admit +15,014) wt 146 today down from 162 yesterday.   05/03 0701 - 05/04 0700 In: 2429.8 [I.V.:1984.8; NG/GT:200; IV Piggyback:160] Out: 7150 [Urine:7150] Intake/Output this shift: Total I/O In: 60.5 [I.V.:30.5; Other:30] Out: 950 [Urine:950]  PE: General:awake, anxious NAD Skin:Warm and dry, brisk capillary refill HEENT:normocephalic, sclera clear, mucus membranes moist Neck: trach in place Heart:S1S2 RRR without murmur, gallup, rub or click Lungs:clear, ant. without rales, rhonchi, or wheezes BJY:NWGNAbd:less tense, non tender, + BS, do not palpate liver spleen or masses Ext:no lower ext edema, SCD stockings in place,  2+ radial pulses Neuro:alert , MAE, follows commands, + facial symmetry Tele: SR with ST last night   Lab Results:  Recent Labs  06/08/14 0545 06/09/14 0447  WBC 18.7* 15.9*  HGB 10.7* 10.4*  HCT 35.4* 33.9*  PLT 311 330   BMET  Recent Labs  06/08/14 0513 06/09/14 0447  NA 144 152*  K 3.4* 3.3*  CL 104 107  CO2 30 34*  GLUCOSE 138* 118*  BUN 29* 29*  CREATININE 3.94* 3.76*  CALCIUM 8.0* 8.7*   No results for input(s): TROPONINI in the last 72 hours.  Invalid input(s): CK, MB  Lab Results  Component Value Date   TRIG 174* 04/06/2014   No results found for: HGBA1C   Lab Results  Component Value Date   TSH 1.679 05/26/2014    Hepatic Function Panel  Recent Labs  06/09/14 0447  ALBUMIN 2.9*      Studies/Results: Dg Chest Port 1 View  06/09/2014   CLINICAL DATA:  Respiratory failure  EXAM: PORTABLE CHEST - 1 VIEW  COMPARISON:  06/08/2014  FINDINGS: There is a right jugular central  line. There is marked cardiomegaly. There is improvement, with partial clearance of central airspace opacities.  IMPRESSION: Partial clearance of central airspace opacities. Marked unchanged cardiomegaly.   Electronically Signed   By: Ellery Plunk M.D.   On: 06/09/2014 05:39   Dg Chest Port 1  View  06/08/2014   CLINICAL DATA:  Respiratory failure  EXAM: PORTABLE CHEST - 1 VIEW  COMPARISON:  06/04/2014  FINDINGS: There is a tracheostomy. There is a right jugular central line with tip in the upper SVC. There is extensive spinal fixation hardware. There is unchanged cardiomegaly. Vascular and interstitial congestive changes persist, probably worsened. Retrocardiac left base opacity persists without significant interval change.  IMPRESSION: Support equipment appears satisfactorily positioned.  Worsened congestive heart failure. Persistent left base consolidation.   Electronically Signed   By: Ellery Plunk M.D.   On: 06/08/2014 06:38   Dg Abd Portable 1v  06/07/2014   CLINICAL DATA:  Abdominal distension. Recurrent respiratory failure. Chronic gastrostomy tube.  EXAM: PORTABLE ABDOMEN - 1 VIEW  COMPARISON:  06/04/2014  FINDINGS: Again noted is a gastrostomy tube. Spinal rods throughout the thoracolumbar spine. There is a nonobstructive bowel gas pattern. Chronic deformity of the lower ribs. There is gas in the colon. Evidence for a rectal tube.  IMPRESSION: Nonobstructive bowel gas pattern.  Gastrostomy tube.   Electronically Signed   By: Richarda Overlie M.D.   On: 06/07/2014 11:55    Medications: I have reviewed the patient's current medications. Scheduled Meds: . antiseptic oral rinse  7 mL Mouth Rinse QID  . bacitracin  1 application Topical 3 times per day  . budesonide (PULMICORT) nebulizer solution  0.5 mg Nebulization BID  . chlorhexidine  15 mL Mouth Rinse BID  . dexmethylphenidate  20 mg Oral QAC breakfast  . docusate  100 mg Per Tube BID  . free water  250 mL Per Tube Q6H  . heparin subcutaneous  5,000 Units Subcutaneous 3 times per day  . insulin aspart  0-9 Units Subcutaneous 6 times per day  . ipratropium-albuterol  3 mL Nebulization Q6H  . isosorbide-hydrALAZINE  1 tablet Oral BID  . pantoprazole sodium  40 mg Per Tube Q1200  . potassium chloride  20 mEq Per Tube Once  .  QUEtiapine  200 mg Oral BID  . sodium chloride  3 mL Intravenous Q12H  . valproate sodium  1,000 mg Intravenous Q12H   Continuous Infusions: . dexmedetomidine Stopped (06/09/14 0758)  . dextrose 75 mL/hr at 06/08/14 1703   PRN Meds:.acetaminophen **OR** [DISCONTINUED] acetaminophen, bisacodyl, fentaNYL (SUBLIMAZE) injection, labetalol, metoprolol, midazolam, [DISCONTINUED] ondansetron **OR** ondansetron (ZOFRAN) IV, polyethylene glycol, sodium chloride  Assessment/Plan: 1. Transient bradycardia in the setting of aspiration- no further episodes noted though at rest HR 57 at times.  2. 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  Plan to add BB today - OK with CCM.  Coreg 3.125 per tube.  3. Recurrent respiratory failure- trach, on vent- chest xray with improved CHF today and negative fluid with wt loss. CCM following   4. Acute renal failure -SCr 7.43--decreasing yesterday--> 4.35--> yest. 3.94 -->3.76  5. Klippel Feil Syndrome  6. Restrictive lung physiology- re-do trach on 4/27 per CCM  7. HCAP- resolved   8. Chronic G tube, abd less tense with BMs, KUB without acute issue WBC elevated   9. Hyperglycemia without prior DM per CCM  10. Anemia per CCM possibly due to  acute illness  11. Tachycardia, S.T up to 162 pm 06/07/14 was restless and rec'd IV hydralazine. Thought tachy may be IV hydralazine it was stopped and then fentanyl and precedex. Also with fever at that time. LAST PM again with tachycardia 130-140 precedex resumed, again pt with fever of 103.      12. Hypomagnesium, replaced and now 2.1 by CCM  13. Hypokalemia, replacing by CCM.   LOS: 17 days   Time spent with pt. :15 minutes. [facility]pl[facility] Hills HlthNGOLD,LAURA R  Nurse Practitioner Certified Pager [phone] or after 5pm and on weekends call [phone] 06/09/2014, 9:18 AM  History and all data above reviewed.  He is awake and in no distress  Patient  examined.  I agree with the findings as above.  The patient exam reveals AVW:UJWJXCOR:Tachy  ,  Lungs: Decreased breath sounds with coarse wheezing  ,  Abd: Distended, Ext No edema   .  All available labs, radiology testing, previous records reviewed. Agree with documented assessment and plan. Cardiomyopathy:  Low dose beta blocker started.  I will increase the BiDil to tid.  This could be increased again tomorrow as his BP remains elevated.   Fayrene FearingJames Paulena Servais  11:29 AM  06/09/2014

## 2014-06-09 NOTE — Progress Notes (Signed)
SLP Cancellation Note  Patient Details Name: [name] MRN: [id number] DOB: 11/15/1990   Cancelled treatment:        Orders received for Passy-Muir valve after pt placed on trach collar trials. Discussed with NP who reported pt failed weaning trials. ST will follow along and attempt PMSV when appropriate.    Royce MacadamiaLitaker, Mallerie Blok Willis 06/09/2014, 10:03 AM   Breck CoonsLisa Willis Lonell FaceLitaker M.Ed [employer]-SLP Pager [phone]

## 2014-06-09 NOTE — Progress Notes (Signed)
PULMONARY / CRITICAL CARE MEDICINE   Name: [name] MRN: [id number] DOB: 07/15/1990    ADMISSION DATE:  05/25/2014 CONSULTATION DATE:  06/09/2014  REFERRING MD :  [name]  CHIEF COMPLAINT:  Resp Distress  INITIAL PRESENTATION: 24 y.o with long admission in 04/2014 for ARDS, tstomy decannulated 04/27/14, PEG in situ re-admitted 4/17 for altered mental status, which improved. Intent was to remove PEG on 4/22. He developed progressive AKI. PCCM consulted for acute resp acidosis on 4/20 after swallow study with ? Aspiration.   PMH - club foot, hemiatrophy of RLE, Klippel Feil Syndrome, scoliosis s/p harrington rod surgery (2003), Restrictive lung physiology, Tourette's syndrome.  He is very functional & [facility] [employer], lives with mom.  STUDIES:  4/18 CT chest/ abd/pelvis:  BLL atx/ pna 4/24 Echocardiogram (performed prior to bradycardic arrest): Moderate LVH, LVEF 30-35%, decreased from LVEF 50-55% 04/05/14  SIGNIFICANT EVENTS: 4/20  swallow eval >> failed 4/20 intubated for resp acidosis 4/23  Failed self extubation due to rapid and severe hypoxemia 4/24  bradycardic arrest triggered by severe agitation during mouth care with approx 5 mins chest compressions and 2 amps epi 4/25  Cardiology consult for LVEF 30-35%, started on bicarb gtt, ENT consulted for trach 4/27  trach per ENT 5/02  Seroquel increased, fent/precedex gtt's weaned.  BMx1 5/04  Tachycardia with fever to 103 overnight  SUBJECTIVE:  RN reports fever to 103 overnight, tachycardia.  Precedex off this am.  No distress. Pt asking to be able to talk and drink  VITAL SIGNS: Temp:  [98.5 F (36.9 C)-103 F (39.4 C)] 100 F (37.8 C) (05/04 0824) Pulse Rate:  [95-145] 115 (05/04 0700) Resp:  [18-31] 20 (05/04 0700) BP: (113-183)/(22-103) 169/101 mmHg (05/04 0700) SpO2:  [90 %-100 %] 94 % (05/04 0824) FiO2 (%):  [30 %-40 %] 30 % (05/04 0824) Weight:  [146 lb 6.2 oz (66.4 kg)] 146 lb 6.2 oz (66.4 kg) (05/04 0444)    HEMODYNAMICS:     VENTILATOR SETTINGS: Vent Mode:  [-] PRVC FiO2 (%):  [30 %-40 %] 30 % Set Rate:  [20 bmp] 20 bmp Vt Set:  [450 mL] 450 mL PEEP:  [5 cmH20] 5 cmH20 Plateau Pressure:  [30 cmH20-38 cmH20] 32 cmH20   INTAKE / OUTPUT:  Intake/Output Summary (Last 24 hours) at 06/09/14 0842 Last data filed at 06/09/14 0800  Gross per 24 hour  Intake 2260.66 ml  Output   8100 ml  Net -5839.34 ml    PHYSICAL EXAMINATION: Gen: chronically ill young adult in NAD HENT: NCAT, trach in place, c/d/i PULM: even/non-labored on vent, lungs bilaterally coarse L>R CV: RRR, no clear mgr GI: distended, bsx4 hypoactive, PEG c/d/i Derm: no breakdown MSK: atrophy of bilateral legs, normal bulk/tone arms with pitting edema Neuro: Awake, MAE  LABS:  CBC  Recent Labs Lab 06/07/14 0450 06/08/14 0545 06/09/14 0447  WBC 6.3 18.7* 15.9*  HGB 10.5* 10.7* 10.4*  HCT 33.8* 35.4* 33.9*  PLT 332 311 330   Coag's No results for input(s): APTT, INR in the last 168 hours.   BMET  Recent Labs Lab 06/07/14 0450 06/08/14 0513 06/09/14 0447  NA 147* 144 152*  K 3.2* 3.4* 3.3*  CL 106 104 107  CO2 32 30 34*  BUN 35* 29* 29*  CREATININE 4.35* 3.94* 3.76*  GLUCOSE 120* 138* 118*   Electrolytes  Recent Labs Lab 06/07/14 0450 06/08/14 0513 06/08/14 0545 06/09/14 0447  CALCIUM 7.9* 8.0*  --  8.7*  MG 1.9  --  1.6* 2.1  PHOS 6.3* 6.8*  --  5.1*   Sepsis Markers No results for input(s): LATICACIDVEN, PROCALCITON, O2SATVEN in the last 168 hours.   ABG No results for input(s): PHART, PCO2ART, PO2ART in the last 168 hours. Liver Enzymes  Recent Labs Lab 06/04/14 0500  06/07/14 0450 06/08/14 0513 06/09/14 0447  AST 21  --   --   --   --   ALT 15  --   --   --   --   ALKPHOS 52  --   --   --   --   BILITOT 0.5  --   --   --   --   ALBUMIN 2.8*  < > 2.6* 2.8* 2.9*  < > = values in this interval not displayed.   Cardiac Enzymes No results for input(s): TROPONINI, PROBNP in  the last 168 hours.   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 Status - re-do tracheostomy by ENT 4/27 Wheezing - improved with inhaled steroid P:   SBT/WUA daily Goal to advance to ATC Pulmicort + Duoneb Pulmonary hygiene as able, mobilize Trend CXR intermittently  SLP consult for PMV  CARDIOVASCULAR R IJ CVL 4/20 >> 5/4 A:  Tachycardia / SVT - overnight 5/3, 5/4 Persistent hypotension -  4/23, likely due to acidosis, resolved Bradycardic arrest 4/24  Cardiomyopathy - new finding by Echo 4/24, presumably due to acidosis Acute CHF P:  Bidil per cardiology recs D/C central line 5/4, establish PIV then consider PICC in 24-48 hours ICU monitoring  PRN lopressor, labetalol  RENAL A:   AKI - nonoliguric presumably ATN secondary to IV contrast vs ischemic ATN,  improving Anasarca - due to volume administered,  improved Hypokalemia Hypernatremia  Hypomagnesemia  P:   Reduce D5 @ 75 ml/hr with CXR appearance (5/3) Free water, Q6 Renal signed off 5/2 Hold further lasix for now Replace electrolytes as indicated, KCL 20 x1 on 5/4  GASTROINTESTINAL A:   Chronic G tube Bloating + High Gastric Residuals, new 4/29 > likely volume of tube feeds is too high, KUB images reviewed from 4/29 & 5/2, OK Constipation Protein Calorie Malnutrition - albumin 2.6 P:   SUP: enteral PPI Resume TF 5/4 PRN dulcolax and miralax 4/23 SLP consult  HEMATOLOGIC A:   Anemia - suspect related to critical illness P:  DVT px: SQ heparin Monitor CBC  intermittently Transfuse per usual ICU guidelines   INFECTIOUS A:   HCAP - on admission, resolved Fever - tmax 101.2 on 5/3, 103 on 5/4 P:   Monitor fever curve / WBC Re-culture  D/c central line Assess PCT trend  BCx2 5/4 >> UC 5/4 >>   ENDOCRINE A:   Hyperglycemia without prior dx of DM Hypoglycemia 4/27 due to decreased po intake P:   Sensitive SSI D5W as above  NEUROLOGIC A:   Klippel Feil Syndrome ADD Severe agitation - causing vent asynchrony and self extubation, history of Tourette's, ADHD, had EPS reaction to haldol last hospital stay, Depakote helping; precedex appears to be ineffective for him  P:   RASS goal: 0 Continue fentanyl / versed PRN, precedex gtt Continue Focalin  Continue depakote (helped during last hospitalization as well for ICU delirium) Seroquel 200 mg BID, monitor response  FAMILY  - Updates: Called mother 5/4 [name]([name] W [phone], [name] [phone]), no answer and left message on voicemail   Canary BrimBrandi Clint Biello, NP-C [facility] Pgr: [phone] or [phone]

## 2014-06-09 NOTE — Progress Notes (Signed)
Pt placed on 100% TC per NP. Pt doing well at this time.

## 2014-06-09 NOTE — Progress Notes (Signed)
Pt HR has continually remained elevated despite addition of precedex. Oral temp taken and pt febrile at 103 degress F. Pt given 650 tylenol through PEG, temperature in room decreased, ice pack placed on groin and under arms and MD made aware. Prn order for metoprolol given if needed.

## 2014-06-09 NOTE — Progress Notes (Addendum)
ANTIBIOTIC CONSULT NOTE - INITIAL  Pharmacy Consult for Vancomycin Indication: Possible bacteremia/line sepsis  No Known Allergies  Patient Measurements: Height: 4\' 11"  (149.9 cm) Weight: 146 lb 6.2 oz (66.4 kg) IBW/kg (Calculated) : 47.7  Vital Signs: Temp: 100 F (37.8 C) (05/04 0824) Temp Source: Oral (05/04 0824) BP: 157/85 mmHg (05/04 0839) Pulse Rate: 111 (05/04 0839) Intake/Output from previous day: 05/03 0701 - 05/04 0700 In: 2429.8 [I.V.:1984.8; NG/GT:200; IV Piggyback:160] Out: 7150 [Urine:7150] Intake/Output from this shift: Total I/O In: 60.5 [I.V.:30.5; Other:30] Out: 950 [Urine:950]  Labs:  Recent Labs  06/07/14 0450 06/08/14 0513 06/08/14 0545 06/09/14 0447  WBC 6.3  --  18.7* 15.9*  HGB 10.5*  --  10.7* 10.4*  PLT 332  --  311 330  CREATININE 4.35* 3.94*  --  3.76*   Estimated Creatinine Clearance: 23.9 mL/min (by C-G formula based on Cr of 3.76). No results for input(s): VANCOTROUGH, VANCOPEAK, VANCORANDOM, GENTTROUGH, GENTPEAK, GENTRANDOM, TOBRATROUGH, TOBRAPEAK, TOBRARND, AMIKACINPEAK, AMIKACINTROU, AMIKACIN in the last 72 hours.   Microbiology: Recent Results (from the past 720 hour(s))  Blood Culture (routine x 2)     Status: None   Collection Time: 05/13/2014 10:30 AM  Result Value Ref Range Status   Specimen Description BLOOD RIGHT HAND  [facility]  Final   Special Requests NONE  Final   Culture   Final    NO GROWTH 5 DAYS [facility]formed at [facility]    Report Status 05/29/2014 FINAL  Final  Blood Culture (routine x 2)     Status: None   Collection Time: 05/30/2014 11:02 AM  Result Value Ref Range Status   Specimen Description BLOOD RIGHT HAND  Final   Special Requests BOTTLES DRAWN AEROBIC AND ANAEROBIC 5 CC EACH  Final   Culture   Final    NO GROWTH 5 DAYS Performed at [facility]    Report Status 05/29/2014 FINAL  Final  Urine culture     Status: None   Collection Time: 06/03/2014  1:16 PM  Result Value Ref  Range Status   Specimen Description URINE, CLEAN CATCH  Final   Special Requests NONE  Final   Colony Count NO GROWTH Performed at [facility]   Final   Culture NO GROWTH Performed at [facility]   Final   Report Status 05/24/2014 FINAL  Final  MRSA PCR Screening     Status: None   Collection Time: 05/26/14  9:45 AM  Result Value Ref Range Status   MRSA by PCR NEGATIVE NEGATIVE Final    Comment:        The GeneXpert MRSA Assay (FDA approved for NASAL specimens only), is one component of a comprehensive MRSA colonization surveillance program. It is not intended to diagnose MRSA infection nor to guide or monitor treatment for MRSA infections.   Culture, respiratory (NON-Expectorated)     Status: None   Collection Time: 05/26/14  4:30 PM  Result Value Ref Range Status   Specimen Description TRACHEAL ASPIRATE  Final   Special Requests NONE  Final   Gram Stain   Final    RARE WBC PRESENT, PREDOMINANTLY PMN NO SQUAMOUS EPITHELIAL CELLS SEEN NO ORGANISMS SEEN Performed at [facility]    Culture   Final    Non-Pathogenic Oropharyngeal-type Flora Isolated. Performed at [facility]    Report Status 05/29/2014 FINAL  Final    Medical History: Past Medical History  Diagnosis Date  . Tourette's syndrome   .  Tic disorder   . Scoliosis   . Hemiatrophy of right leg   . Club foot     Right  . Mental retardation     mild    Assessment: [age] y/oM with PMH of Tourette's syndrome,Klippel-Feil syndrome, scoliosis who [facility], initially placed on antibiotics for sepsis/HCAP. Pt subsequently developed hypercarbic respiratory failure requiring intubation. Patient received contrast for CT on 05/24/14 and subsequently developed non-oliguric AKI. Fever up to Tmax 103 this AM. CVL removed, and pharmacy consulted to dose Vancomycin for possible line sepsis.   4/17 >> Vancomycin >> 4/23 4/17 >> Zosyn >> 4/23  5/4 >> Vancomycin  >>  4/17 blood x 2: NGF 4/17 urine: NGF 4/20 MRSA PCR: neg 4/20 trach aspirate: non-pathogenic oropharyngeal flora 5/4 blood x 2: sent  WBC: elevated Renal: AKI, SCr slowing improving, 3.76,CrCl ~ 24 mL/min   Goal of Therapy:  Vancomycin trough level 15-20 mcg/ml  Appropriate antibiotic dosing for renal function and indication Eradication of infection  Plan:   Vancomycin 750 mg IV q24h.  Likely plan to check Vancomycin trough level early, given AKI and previous hx of supratherapeutic levels.   Monitor renal function, cultures, clinical course.    Greer PickerelJigna Jalana Moore, PharmD, BCPS Pager: [phone] 06/09/2014 10:30 AM

## 2014-06-09 NOTE — Progress Notes (Signed)
At bedside, unable to place PIV even with ultrasound. Lab also at bedside and unable to obtain blood.IV team would be glad to place PICC, floor RN aware, notified MD.

## 2014-06-10 ENCOUNTER — Inpatient Hospital Stay (HOSPITAL_COMMUNITY): Payer: Medicaid Other

## 2014-06-10 DIAGNOSIS — I469 Cardiac arrest, cause unspecified: Secondary | ICD-10-CM | POA: Insufficient documentation

## 2014-06-10 LAB — CBC
HEMATOCRIT: 34.6 % — AB (ref 39.0–52.0)
Hemoglobin: 10.2 g/dL — ABNORMAL LOW (ref 13.0–17.0)
MCH: 28.7 pg (ref 26.0–34.0)
MCHC: 29.5 g/dL — ABNORMAL LOW (ref 30.0–36.0)
MCV: 97.5 fL (ref 78.0–100.0)
PLATELETS: 282 10*3/uL (ref 150–400)
RBC: 3.55 MIL/uL — AB (ref 4.22–5.81)
RDW: 15.2 % (ref 11.5–15.5)
WBC: 15.1 10*3/uL — AB (ref 4.0–10.5)

## 2014-06-10 LAB — BLOOD GAS, ARTERIAL
Acid-Base Excess: 1.1 mmol/L (ref 0.0–2.0)
Acid-Base Excess: 4.4 mmol/L — ABNORMAL HIGH (ref 0.0–2.0)
Acid-Base Excess: 6.6 mmol/L — ABNORMAL HIGH (ref 0.0–2.0)
Acid-Base Excess: 4.4 mmol/L — ABNORMAL HIGH (ref 0.0–2.0)
BICARBONATE: 29.8 meq/L — AB (ref 20.0–24.0)
BICARBONATE: 31.1 meq/L — AB (ref 20.0–24.0)
Bicarbonate: 28.3 mEq/L — ABNORMAL HIGH (ref 20.0–24.0)
Bicarbonate: 31.5 mEq/L — ABNORMAL HIGH (ref 20.0–24.0)
DRAWN BY: 281201
Drawn by: 281201
Drawn by: 281201
FIO2: 1 %
FIO2: 100 %
FIO2: 1 %
FIO2: 1 %
LHR: 16 {breaths}/min
LHR: 24 {breaths}/min
MECHVT: 450 mL
O2 SAT: 92.4 %
O2 SAT: 74.3 %
O2 Saturation: 86.8 %
O2 Saturation: 81.6 %
PCO2 ART: 62.4 mmHg — AB (ref 35.0–45.0)
PCO2 ART: 63.2 mmHg — AB (ref 35.0–45.0)
PCO2 ART: 37.9 mmHg (ref 35.0–45.0)
PCO2 ART: 60.3 mmHg — AB (ref 35.0–45.0)
PEEP/CPAP: 8 cmH2O
PEEP: 14 cmH2O
PEEP: 16 cmH2O
PEEP: 20 cmH2O
PH ART: 7.332 — AB (ref 7.350–7.450)
Patient temperature: 98.6
Patient temperature: 98.6
Patient temperature: 98.6
Patient temperature: 98.6
Pressure control: 30 cmH2O
Pressure control: 20 cmH2O
RATE: 35 resp/min
RATE: 35 resp/min
TCO2: 27 mmol/L (ref 0–100)
TCO2: 29.5 mmol/L (ref 0–100)
TCO2: 27 mmol/L (ref 0–100)
TCO2: 29 mmol/L (ref 0–100)
VT: 300 mL
pH, Arterial: 7.278 — ABNORMAL LOW (ref 7.350–7.450)
pH, Arterial: 7.318 — ABNORMAL LOW (ref 7.350–7.450)
pH, Arterial: 7.507 — ABNORMAL HIGH (ref 7.350–7.450)
pO2, Arterial: 82.4 mmHg (ref 80.0–100.0)
pO2, Arterial: 48.5 mmHg — ABNORMAL LOW (ref 80.0–100.0)
pO2, Arterial: 52.4 mmHg — ABNORMAL LOW (ref 80.0–100.0)
pO2, Arterial: 55.8 mmHg — ABNORMAL LOW (ref 80.0–100.0)

## 2014-06-10 LAB — GLUCOSE, CAPILLARY
GLUCOSE-CAPILLARY: 111 mg/dL — AB (ref 70–99)
GLUCOSE-CAPILLARY: 102 mg/dL — AB (ref 70–99)
GLUCOSE-CAPILLARY: 142 mg/dL — AB (ref 70–99)
GLUCOSE-CAPILLARY: 120 mg/dL — AB (ref 70–99)
Glucose-Capillary: 106 mg/dL — ABNORMAL HIGH (ref 70–99)
Glucose-Capillary: 118 mg/dL — ABNORMAL HIGH (ref 70–99)
Glucose-Capillary: 78 mg/dL (ref 70–99)

## 2014-06-10 LAB — COMPREHENSIVE METABOLIC PANEL
ALT: 33 U/L (ref 17–63)
ANION GAP: 9 (ref 5–15)
AST: 122 U/L — ABNORMAL HIGH (ref 15–41)
Albumin: 2.5 g/dL — ABNORMAL LOW (ref 3.5–5.0)
Alkaline Phosphatase: 121 U/L (ref 38–126)
BUN: 28 mg/dL — ABNORMAL HIGH (ref 6–20)
CALCIUM: 7.3 mg/dL — AB (ref 8.9–10.3)
CO2: 31 mmol/L (ref 22–32)
Chloride: 114 mmol/L — ABNORMAL HIGH (ref 101–111)
Creatinine, Ser: 3.81 mg/dL — ABNORMAL HIGH (ref 0.61–1.24)
GFR, EST AFRICAN AMERICAN: 24 mL/min — AB (ref >60)
GFR, EST NON AFRICAN AMERICAN: 21 mL/min — AB (ref >60)
GLUCOSE: 130 mg/dL — AB (ref 70–99)
POTASSIUM: 3.4 mmol/L — AB (ref 3.5–5.1)
SODIUM: 154 mmol/L — AB (ref 135–145)
Total Bilirubin: 0.4 mg/dL (ref 0.3–1.2)
Total Protein: 6.5 g/dL (ref 6.5–8.1)

## 2014-06-10 LAB — BASIC METABOLIC PANEL
ANION GAP: 14 (ref 5–15)
BUN: 28 mg/dL — ABNORMAL HIGH (ref 6–20)
CHLORIDE: 109 mmol/L (ref 101–111)
CO2: 33 mmol/L — AB (ref 22–32)
Calcium: 7.9 mg/dL — ABNORMAL LOW (ref 8.9–10.3)
Creatinine, Ser: 3.51 mg/dL — ABNORMAL HIGH (ref 0.61–1.24)
GFR calc Af Amer: 27 mL/min — ABNORMAL LOW (ref >60)
GFR calc non Af Amer: 23 mL/min — ABNORMAL LOW (ref >60)
GLUCOSE: 104 mg/dL — AB (ref 70–99)
POTASSIUM: 2.9 mmol/L — AB (ref 3.5–5.1)
SODIUM: 156 mmol/L — AB (ref 135–145)

## 2014-06-10 LAB — PROCALCITONIN: Procalcitonin: 5.55 ng/mL

## 2014-06-10 MED ORDER — VANCOMYCIN HCL IN DEXTROSE 1-5 GM/200ML-% IV SOLN: 1000.0000 mg | INTRAVENOUS | Status: DC

## 2014-06-10 MED ORDER — MIDAZOLAM HCL 2 MG/2ML IJ SOLN: 2.0000 mg | Freq: Once | INTRAMUSCULAR | Status: DC

## 2014-06-10 MED ORDER — DEXTROSE-NACL 5-0.45 % IV SOLN
INTRAVENOUS | Status: DC
Start: 2014-06-11 — End: 2014-06-11
  Administered 2014-06-10: via INTRAVENOUS

## 2014-06-10 MED ORDER — MIDAZOLAM HCL 2 MG/2ML IJ SOLN: 2.0000 mg | Freq: Once | INTRAMUSCULAR | Status: AC | PRN

## 2014-06-10 MED ORDER — ROCURONIUM BROMIDE 50 MG/5ML IV SOLN
INTRAVENOUS | Status: AC
  Administered 2014-06-10: 50 mg
  Filled 2014-06-10: qty 2

## 2014-06-10 MED ORDER — CISATRACURIUM BOLUS VIA INFUSION
2.5000 mg | Freq: Once | INTRAVENOUS | Status: AC
  Filled 2014-06-10: qty 3

## 2014-06-10 MED ORDER — VITAL HIGH PROTEIN PO LIQD
1000.0000 mL | ORAL | Status: DC
  Filled 2014-06-10 (×2): qty 1000

## 2014-06-10 MED ORDER — LIDOCAINE HCL (CARDIAC) 20 MG/ML IV SOLN
INTRAVENOUS | Status: AC
  Filled 2014-06-10: qty 5

## 2014-06-10 MED ORDER — SODIUM CHLORIDE 0.9 % IV SOLN
1.0000 mg/h | INTRAVENOUS | Status: DC
  Administered 2014-06-10: 4 mg/h via INTRAVENOUS
  Administered 2014-06-10: 7 mg/h via INTRAVENOUS
  Administered 2014-06-11: 6 mg/h via INTRAVENOUS
  Filled 2014-06-10 (×4): qty 10

## 2014-06-10 MED ORDER — POTASSIUM CHLORIDE 20 MEQ/15ML (10%) PO SOLN
40.0000 meq | Freq: Once | ORAL | Status: AC
  Administered 2014-06-10: 40 meq
  Filled 2014-06-10: qty 30

## 2014-06-10 MED ORDER — CISATRACURIUM BESYLATE (PF) 200 MG/20ML IV SOLN
3.0000 ug/kg/min | INTRAVENOUS | Status: DC
  Filled 2014-06-10: qty 20

## 2014-06-10 MED ORDER — PHENYLEPHRINE HCL 10 MG/ML IJ SOLN
30.0000 ug/min | INTRAVENOUS | Status: DC
  Administered 2014-06-10: 50 ug/min via INTRAVENOUS
  Administered 2014-06-10: 200 ug/min via INTRAVENOUS
  Filled 2014-06-10 (×3): qty 4

## 2014-06-10 MED ORDER — PHENYLEPHRINE HCL 10 MG/ML IJ SOLN
30.0000 ug/min | INTRAVENOUS | Status: DC
  Administered 2014-06-10: 30 ug/min via INTRAVENOUS
  Filled 2014-06-10: qty 1

## 2014-06-10 MED ORDER — ARTIFICIAL TEARS OP OINT
1.0000 "application " | TOPICAL_OINTMENT | Freq: Three times a day (TID) | OPHTHALMIC | Status: DC
  Administered 2014-06-10 – 2014-06-11 (×4): 1 via OPHTHALMIC
  Filled 2014-06-10: qty 3.5

## 2014-06-10 MED ORDER — PHENYLEPHRINE HCL 10 MG/ML IJ SOLN
30.0000 ug/min | INTRAVENOUS | Status: DC
  Administered 2014-06-10: 20 ug/min via INTRAVENOUS
  Filled 2014-06-10: qty 1

## 2014-06-10 MED ORDER — SODIUM CHLORIDE 0.9 % IV SOLN
INTRAVENOUS | Status: DC
  Administered 2014-06-10 (×2): via INTRAVENOUS

## 2014-06-10 MED ORDER — SUCCINYLCHOLINE CHLORIDE 20 MG/ML IJ SOLN
INTRAMUSCULAR | Status: AC
  Filled 2014-06-10: qty 1

## 2014-06-10 MED ORDER — FENTANYL BOLUS VIA INFUSION
50.0000 ug | INTRAVENOUS | Status: DC | PRN
  Filled 2014-06-10: qty 50

## 2014-06-10 MED ORDER — SODIUM CHLORIDE 0.9 % IV SOLN
25.0000 ug/h | INTRAVENOUS | Status: DC
  Administered 2014-06-10: 200 ug/h via INTRAVENOUS
  Administered 2014-06-10 – 2014-06-11 (×2): 300 ug/h via INTRAVENOUS
  Administered 2014-06-11: 400 ug/h via INTRAVENOUS
  Filled 2014-06-10 (×4): qty 50

## 2014-06-10 MED ORDER — ETOMIDATE 2 MG/ML IV SOLN
INTRAVENOUS | Status: AC
  Administered 2014-06-10: 18 mg
  Filled 2014-06-10: qty 20

## 2014-06-10 MED ORDER — SODIUM CHLORIDE 0.9 % IV SOLN
0.5000 ug/kg/min | INTRAVENOUS | Status: DC
  Administered 2014-06-10: 3 ug/kg/min via INTRAVENOUS
  Administered 2014-06-11: 3.5 ug/kg/min via INTRAVENOUS
  Filled 2014-06-10 (×2): qty 20

## 2014-06-10 MED ORDER — FENTANYL CITRATE (PF) 100 MCG/2ML IJ SOLN: 100.0000 ug | Freq: Once | INTRAMUSCULAR | Status: DC

## 2014-06-10 MED ORDER — FENTANYL CITRATE (PF) 100 MCG/2ML IJ SOLN: 100.0000 ug | Freq: Once | INTRAMUSCULAR | Status: AC | PRN

## 2014-06-10 MED ORDER — MIDAZOLAM BOLUS VIA INFUSION
2.0000 mg | INTRAVENOUS | Status: DC | PRN
  Administered 2014-06-10 – 2014-06-11 (×2): 2 mg via INTRAVENOUS
  Filled 2014-06-10 (×3): qty 2

## 2014-06-10 MED ORDER — CRASH CART BULK CHARGE
1.0000 | Freq: Once | Status: DC
  Filled 2014-06-10: qty 1

## 2014-06-10 MED ORDER — SODIUM CHLORIDE 0.9 % IV BOLUS (SEPSIS)
1000.0000 mL | Freq: Once | INTRAVENOUS | Status: AC
  Administered 2014-06-10: 1000 mL via INTRAVENOUS

## 2014-06-10 MED ORDER — NOREPINEPHRINE BITARTRATE 1 MG/ML IV SOLN
0.0000 ug/min | INTRAVENOUS | Status: DC
  Filled 2014-06-10: qty 4

## 2014-06-10 NOTE — Progress Notes (Signed)
Difficult to maintain pt's O2 sat > 88% on full vent support throughout afternoon, frequent desats to 70s on 100% Fi02. Pt required frequent bag ventilation with sats returning to mid 90s, but pt would then desat when placed on ventilator. Respiratory therapy at bedside and MDs Molli KnockYacoub and Tyson AliasFeinstein aware.  Starla Linkarolyn J Hannah Crill, RN

## 2014-06-10 NOTE — Progress Notes (Signed)
eLink Physician-Brief Progress Note Patient Name: [name] DOB: 12/09/1990 MRN: [id number]   Date of Service  06/10/2014  HPI/Events of Note  ARDS, post arrest, hypoxia 87% Total pressures high currently on PC  eICU Interventions  abg now, then will consider redauiction in PC and peep elvation to 20-22 wth permissive hypercapnia     Intervention Category Major Interventions: Hypercarbia - evaluation and management;Respiratory failure - evaluation and management  FEINSTEIN,DANIEL J. 06/10/2014, 4:51 PM

## 2014-06-10 NOTE — Progress Notes (Signed)
Spiritual Care present for code event.  No family present at time.  Will continue to follow for assessment and family support.   Belva CromeStalnaker, Merdith Boyd Wayne MDiv

## 2014-06-10 NOTE — Progress Notes (Signed)
RN called about patients oxygen saturation. Patient sating in the high 80's. RT increased oxygen to 70% and sats are 95%. RT will continue to monitor

## 2014-06-10 NOTE — Procedures (Signed)
Arterial Catheter Insertion Procedure Note [name] [id number] 06/14/1990  Procedure: Insertion of Arterial Catheter  Indications: Blood pressure monitoring and Frequent blood sampling  Procedure Details Consent: Risks of procedure as well as the alternatives and risks of each were explained to the (patient/caregiver).  Consent for procedure obtained. Time Out: Verified patient identification, verified procedure, site/side was marked, verified correct patient position, special equipment/implants available, medications/allergies/relevent history reviewed, required imaging and test results available.  Performed  Maximum sterile technique was used including antiseptics, cap, gloves, gown, hand hygiene, mask and sheet. Skin prep: Chlorhexidine; local anesthetic administered 20 gauge catheter was inserted into left radial artery using the Seldinger technique.  Evaluation Blood flow good; BP tracing good. Complications: No apparent complications.   Aaron BoundYACOUB,[name] 06/10/2014

## 2014-06-10 NOTE — Procedures (Signed)
Arterial Catheter Insertion Procedure Note [name] [id number] 12/14/1990  Procedure: Insertion of Arterial Catheter  Indications: Blood pressure monitoring and Frequent blood sampling  Procedure Details Consent: Risks of procedure as well as the alternatives and risks of each were explained to the (patient/caregiver).  Consent for procedure obtained. Time Out: Verified patient identification, verified procedure, site/side was marked, verified correct patient position, special equipment/implants available, medications/allergies/relevent history reviewed, required imaging and test results available.  Performed  Maximum sterile technique was used including antiseptics, cap, gloves, gown, hand hygiene, mask and sheet. Skin prep: Chlorhexidine; local anesthetic administered 20 gauge catheter was inserted into right radial artery using the Seldinger technique.  Evaluation Blood flow good; BP tracing good. Complications: No apparent complications.   Aaron BoundYACOUB,[name] 06/10/2014

## 2014-06-10 NOTE — Progress Notes (Signed)
Pt continues to have a fever. Rectal temp 103. Pt given oral tylenol, ice packs, and fan to keep body temperature down. Pt resting. MD aware of ongoing fever.

## 2014-06-10 NOTE — Progress Notes (Signed)
eLink Physician-Brief Progress Note Patient Name: [name] DOB: 10/28/1990 MRN: [id number]   Date of Service  06/10/2014  HPI/Events of Note  abg reviewed Remains hyopxia still Ph allows reduction in PC futrther  eICU Interventions  PC down changwe to 2:1 Rate 30 to allow abg in 30      Intervention Category Major Interventions: Hypoxemia - evaluation and management  FEINSTEIN,DANIEL J. 06/10/2014, 5:57 PM

## 2014-06-10 NOTE — Progress Notes (Signed)
Technical Description:  - CPR performance duration: 6mins   - Was defibrillation or cardioversion used?                                                       No   - Was external pacer placed? No  - Was patient intubated pre/post CPR? Pre-existing trach    Post CPR evaluation:  - Final Status - Was patient successfully resuscitated ? Yes - What is current rhythm?   Sinus tach - What is current hemodynamic status?  Hypotensive , on neo gtt  Miscellaneous Information:  - Labs sent, including: CBC, CMET, INR, ABG, lactic   - Primary team notified?    - Family Notified? Yes ,mother  - Additional notes/ transfer status:  Found disconnected from vent, rolled over prone, unresponsive with leads off, connected back to vent, absent pulses, code called, CPR started, required epi x2    Oretha MilchALVA,RAKESH V. MD

## 2014-06-10 NOTE — Evaluation (Signed)
SLP Cancellation Note  Patient Details Name: [name] MRN: [id number] DOB: 09/23/1990   Cancelled treatment:       Reason Eval/Treat Not Completed: Patient not medically ready (spoke to NP who reports pt not going to wean today, code blue called at 1041 am. SLP to continue to follow for PMSV readiness.)  Aaron Burnetamara Alberto Schoch, MS [facility] SLP [phone]

## 2014-06-10 NOTE — Progress Notes (Signed)
Called bed side by NP, patient is decompensating quickly.  Concern he may arrest again.  Patient seen bedside.  Clear vent asynchrony.  Drew ABG and adjusted vent.  Will need to replace TLC and a-line today.  CXR reviewed, severe ARDS, diffuse infiltrate L>R.  Spoke with entire family at length, informed of prognosis and all questions answered.  After discussion, decided to make patient a LCB with no CPR and no cardioversion but otherwise full treatment.  Will change code status.  Additional CC time 60 minutes.  Alyson ReedyWesam G. Yacoub, M.D. [facility]. Pager: [phone]. After hours pager: [phone].

## 2014-06-10 NOTE — Procedures (Signed)
Central Venous Catheter Insertion Procedure Note [name] [id number] 10/15/1990  Procedure: Insertion of Central Venous Catheter Indications: Assessment of intravascular volume, Drug and/or fluid administration and Frequent blood sampling  Procedure Details Consent: Risks of procedure as well as the alternatives and risks of each were explained to the (patient/caregiver).  Consent for procedure obtained. Time Out: Verified patient identification, verified procedure, site/side was marked, verified correct patient position, special equipment/implants available, medications/allergies/relevent history reviewed, required imaging and test results available.  Performed  Maximum sterile technique was used including antiseptics, cap, gloves, gown, hand hygiene, mask and sheet. Skin prep: Chlorhexidine; local anesthetic administered A antimicrobial bonded/coated triple lumen catheter was placed in the left subclavian vein using the Seldinger technique.  Evaluation Blood flow good Complications: No apparent complications Patient did tolerate procedure well. Chest X-ray ordered to verify placement.  CXR: pending.  U/S used in placement.  [name] 06/10/2014, 3:06 PM

## 2014-06-10 NOTE — Progress Notes (Addendum)
Pharmacy: Re-phenylephrine  Phenylephrine drip concentration changed to quardr. Strength per RN's request.  I informed patient's RN Caryn Section(Carolyn Oakley) regarding new concentration and to program infusion pump to new concentration with new bag.  Dorna LeitzAnh Maleiya Pergola, PharmD, BCPS 06/10/2014 12:13 PM

## 2014-06-10 NOTE — Progress Notes (Signed)
ANTIBIOTIC CONSULT NOTE - INITIAL  Pharmacy Consult for Vancomycin Indication: Possible bacteremia/line sepsis  No Known Allergies  Patient Measurements: Height: 4\' 11"  (149.9 cm) Weight: 142 lb 3.2 oz (64.5 kg) IBW/kg (Calculated) : 47.7  Vital Signs: Temp: 101.5 F (38.6 C) (05/05 1200) Temp Source: Rectal (05/05 1200) BP: 125/105 mmHg (05/05 1549) Pulse Rate: 127 (05/05 1549) Intake/Output from previous day: 05/04 0701 - 05/05 0700 In: 2795.5 [I.V.:1980.5; NG/GT:500; IV Piggyback:260] Out: 2925 [Urine:2925] Intake/Output from this shift: Total I/O In: 30 [Other:30] Out: 1500 [Urine:1500]  Labs:  Recent Labs  06/08/14 0545 06/09/14 0447 06/10/14 0404 06/10/14 1349  WBC 18.7* 15.9* 15.1*  --   HGB 10.7* 10.4* 10.2*  --   PLT 311 330 282  --   CREATININE  --  3.76* 3.51* 3.81*   Estimated Creatinine Clearance: 23.2 mL/min (by C-G formula based on Cr of 3.81). No results for input(s): VANCOTROUGH, VANCOPEAK, VANCORANDOM, GENTTROUGH, GENTPEAK, GENTRANDOM, TOBRATROUGH, TOBRAPEAK, TOBRARND, AMIKACINPEAK, AMIKACINTROU, AMIKACIN in the last 72 hours.   Microbiology: Recent Results (from the past 720 hour(s))  Blood Culture (routine x 2)     Status: None   Collection Time: 05/21/2014 10:30 AM  Result Value Ref Range Status   Specimen Description BLOOD RIGHT HAND  3 [facility]  Final   Special Requests NONE  Final   Culture   Final    NO GROWTH 5 DAYS [facility]formed at [facility]    Report Status 05/29/2014 FINAL  Final  Blood Culture (routine x 2)     Status: None   Collection Time: 05/14/2014 11:02 AM  Result Value Ref Range Status   Specimen Description BLOOD RIGHT HAND  Final   Special Requests BOTTLES DRAWN AEROBIC AND ANAEROBIC 5 CC EACH  Final   Culture   Final    NO GROWTH 5 DAYS Performed at [facility]    Report Status 05/29/2014 FINAL  Final  Urine culture     Status: None   Collection Time: 05/19/2014  1:16 PM  Result Value Ref  Range Status   Specimen Description URINE, CLEAN CATCH  Final   Special Requests NONE  Final   Colony Count NO GROWTH Performed at [facility]   Final   Culture NO GROWTH Performed at [facility]   Final   Report Status 05/24/2014 FINAL  Final  MRSA PCR Screening     Status: None   Collection Time: 05/26/14  9:45 AM  Result Value Ref Range Status   MRSA by PCR NEGATIVE NEGATIVE Final    Comment:        The GeneXpert MRSA Assay (FDA approved for NASAL specimens only), is one component of a comprehensive MRSA colonization surveillance program. It is not intended to diagnose MRSA infection nor to guide or monitor treatment for MRSA infections.   Culture, respiratory (NON-Expectorated)     Status: None   Collection Time: 05/26/14  4:30 PM  Result Value Ref Range Status   Specimen Description TRACHEAL ASPIRATE  Final   Special Requests NONE  Final   Gram Stain   Final    RARE WBC PRESENT, PREDOMINANTLY PMN NO SQUAMOUS EPITHELIAL CELLS SEEN NO ORGANISMS SEEN Performed at [facility]    Culture   Final    Non-Pathogenic Oropharyngeal-type Flora Isolated. Performed at [facility]    Report Status 05/29/2014 FINAL  Final  Culture, blood (routine x 2)     Status: None (Preliminary result)  Collection Time: 06/09/14  9:35 AM  Result Value Ref Range Status   Specimen Description BLOOD LEFT ARM  Final   Special Requests BOTTLES DRAWN AEROBIC ONLY 1.5CC  Final   Culture   Final           BLOOD CULTURE RECEIVED NO GROWTH TO DATE CULTURE WILL BE HELD FOR 5 DAYS BEFORE ISSUING A FINAL NEGATIVE REPORT Performed at [facility]    Report Status PENDING  Incomplete  Culture, blood (routine x 2)     Status: None (Preliminary result)   Collection Time: 06/09/14  9:40 AM  Result Value Ref Range Status   Specimen Description BLOOD RIGHT ARM  Final   Special Requests BOTTLES DRAWN AEROBIC ONLY 5CC  Final   Culture   Final           BLOOD  CULTURE RECEIVED NO GROWTH TO DATE CULTURE WILL BE HELD FOR 5 DAYS BEFORE ISSUING A FINAL NEGATIVE REPORT Performed at [facility]    Report Status PENDING  Incomplete    Medical History: Past Medical History  Diagnosis Date  . Tourette's syndrome   . Tic disorder   . Scoliosis   . Hemiatrophy of right leg   . Club foot     Right  . Mental retardation     mild    Assessment: [age] y/oM with PMH of Tourette's syndrome,Klippel-Feil syndrome, scoliosis who [facility], initially placed on antibiotics for sepsis/HCAP. Pt subsequently developed hypercarbic respiratory failure requiring intubation. Patient received contrast for CT on 05/24/14 and subsequently developed non-oliguric AKI. Fever up to Tmax 103 this AM. CVL removed, and pharmacy consulted to dose Vancomycin for possible line sepsis.   Significant Events: 5/5: Code blue called after pt found disconnected from vent, unresponsive   4/17 >> Vancomycin >> 4/23 4/17 >> Zosyn >> 4/23  5/4 >> Vancomycin >>  4/17 blood x 2: NGF 4/17 urine: NGF 4/20 MRSA PCR: neg 4/20 trach aspirate: non-pathogenic oropharyngeal flora 5/4 blood x 2: NGTD  WBC: elevated Renal: AKI, SCr rising, 3.81,CrCl ~ 23 mL/min   Goal of Therapy:  Vancomycin trough level 15-20 mcg/ml  Appropriate antibiotic dosing for renal function and indication Eradication of infection  Plan:   Given rising SCr, will adjust Vancomycin regimen to 1g IV q48h after dose given today. New regimen to start on 5/7.   Plan for Vancomycin level in near future, given AKI and previous hx of supratherapeutic levels.   Monitor renal function, cultures, clinical course.   Greer PickerelJigna Lc Joynt, PharmD, BCPS Pager: [phone] 06/10/2014 3:59 PM

## 2014-06-10 NOTE — Progress Notes (Signed)
eLink Physician-Brief Progress Note Patient Name: Massie [name] DOB: 06/03/1990 MRN: [id number]   Date of Service  06/10/2014  HPI/Events of Note  Failed PC hfoc required, but not working    eICU Interventions  Ordered HFOC from [facility] to come over     Intervention Category Major Interventions: Respiratory failure - evaluation and management  Rennee Coyne J. 06/10/2014, 7:05 PM

## 2014-06-10 NOTE — Progress Notes (Signed)
24 year old functional gentleman with a history of club foot, hemiatrophy of RLE, Klippel Feil Syndrome, scoliosis s/p harrington rod surgery (2003), restrictive lung physiology, and Tourette's syndrome. He is status post a prior admission with acute respiratory failure secondary to pneumonia and had a complicated hospital course with ARDS, delirium, acute kidney injury, and he required prolonged ventilatory support and underwent tracheostomy. Following discharge, he was readmitted on 06/17/2014 with worsening mental status and respiratory failure requiring reintubation. He has developed worsening renal function and hypotension. Yesterday at 10:19 AM while being suctioned and getting mouth care he developed significant bradycardia with heart rates dropping to 39 bpm, with apparrent sinus bradycardia. Due to posible possible PEA transient CPR was administered. Heart rate ultimately improved. He has not had any further significant bradycardia arrhythmia since. Question if initial episode mediated by a vagal response to suctioning and mouth care. An echo Doppler study showed moderate left ventricular hypertrophy with moderately severe new LV dysfunction with an ejection fraction of 30-35% with diffuse hypokinesis. (normal LVEF in 03/2014) his troponin elevation is flat and most probably sec to CHF and acute renal failure. No ACEI/ARB, we started BiDil and coreg.     Subjective: Awake, pointing at drinking cup. No pain  Objective: Vital signs in last 24 hours: Temp:  [100 F (37.8 C)-103.2 F (39.6 C)] 101.2 F (38.4 C) (05/05 0400) Pulse Rate:  [108-144] 128 (05/05 0600) Resp:  [18-29] 20 (05/05 0600) BP: (86-185)/(19-143) 157/99 mmHg (05/05 0600) SpO2:  [87 %-97 %] 90 % (05/05 0600) FiO2 (%):  [30 %-100 %] 70 % (05/05 0636) Weight:  [142 lb 3.2 oz (64.5 kg)] 142 lb 3.2 oz (64.5 kg) (05/05 0326) Weight change: -4 lb 3 oz (-1.9 kg) Last BM Date: 06/08/14 Intake/Output from  previous day:-129 (wt down 4 lbs from yesterday) 05/04 0701 - 05/05 0700 In: 2795.5 [I.V.:1980.5; NG/GT:500; IV Piggyback:260] Out: 2925 [Urine:2925] Intake/Output this shift:    PE: General:Pleasant affect, NAD Skin:Warm and dry, brisk capillary refill HEENT:normocephalic, sclera clear, mucus membranes moist Neck:trach in place  Heart:S1S2 RRR-rapid without murmur, gallup, rub or click Lungs: without rales, + rhonchi ant., no wheezes RUE:AVWU, non tender, hypoactive + BS, do not palpate liver spleen or masses Ext:no lower ext edema, SCD stockings in place 2+ radial pulses Neuro:alert,  MAE, follows commands, + facial symmetry Tele:  ST at 122 currently    Lab Results:  Recent Labs  06/09/14 0447 06/10/14 0404  WBC 15.9* 15.1*  HGB 10.4* 10.2*  HCT 33.9* 34.6*  PLT 330 282   BMET  Recent Labs  06/09/14 0447 06/10/14 0404  NA 152* 156*  K 3.3* 2.9*  CL 107 109  CO2 34* 33*  GLUCOSE 118* 104*  BUN 29* 28*  CREATININE 3.76* 3.51*  CALCIUM 8.7* 7.9*   No results for input(s): TROPONINI in the last 72 hours.  Invalid input(s): CK, MB  Lab Results  Component Value Date   TRIG 174* 04/06/2014   No results found for: HGBA1C   Lab Results  Component Value Date   TSH 1.679 Jun 17, 2014    Hepatic Function Panel  Recent Labs  06/09/14 0447  ALBUMIN 2.9*   No results for input(s): CHOL in the last 72 hours. No results for input(s): PROTIME in the last 72 hours.     Studies/Results: Dg Chest Port 1 View  06/09/2014   CLINICAL DATA:  Respiratory failure  EXAM: PORTABLE CHEST - 1 VIEW  COMPARISON:  06/08/2014  FINDINGS: There  is a right jugular central line. There is marked cardiomegaly. There is improvement, with partial clearance of central airspace opacities.  IMPRESSION: Partial clearance of central airspace opacities. Marked unchanged cardiomegaly.   Electronically Signed   By: Ellery Plunkaniel R Mitchell M.D.   On: 06/09/2014 05:39    Medications: I have reviewed  the patient's current medications. Scheduled Meds: . antiseptic oral rinse  7 mL Mouth Rinse QID  . bacitracin  1 application Topical 3 times per day  . budesonide (PULMICORT) nebulizer solution  0.5 mg Nebulization BID  . carvedilol  3.125 mg Oral BID WC  . chlorhexidine  15 mL Mouth Rinse BID  . dexmethylphenidate  20 mg Oral QAC breakfast  . docusate  100 mg Per Tube BID  . free water  250 mL Per Tube Q6H  . heparin subcutaneous  5,000 Units Subcutaneous 3 times per day  . insulin aspart  0-9 Units Subcutaneous 6 times per day  . ipratropium-albuterol  3 mL Nebulization Q6H  . isosorbide-hydrALAZINE  1 tablet Oral TID  . pantoprazole sodium  40 mg Per Tube Q1200  . QUEtiapine  200 mg Oral BID  . sodium chloride  3 mL Intravenous Q12H  . valproate sodium  1,000 mg Intravenous Q12H  . vancomycin  750 mg Intravenous Q24H   Continuous Infusions: . dexmedetomidine Stopped (06/09/14 0758)  . dextrose 75 mL/hr at 06/09/14 2159   PRN Meds:.acetaminophen **OR** [DISCONTINUED] acetaminophen, bisacodyl, fentaNYL (SUBLIMAZE) injection, labetalol, metoprolol, midazolam, [DISCONTINUED] ondansetron **OR** ondansetron (ZOFRAN) IV, polyethylene glycol, sodium chloride  Assessment/Plan: 1. Transient bradycardia in the setting of aspiration- no further episodes noted though at rest HR 57 at times.  2. 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 coreg started yesterday - OK with CCM. Coreg 3.125 per tube.  BP to 86/61 last pm- may need to hold further titration of BiDil until BP stable, Pk BP after receiving coreg and hydralazine   3. Recurrent respiratory failure- trach, on vent- chest xray with improved CHF yesterday and negative fluid with wt loss. CCM following Failed weaning yesterday.    4. Acute renal failure -SCr 7.43--decreasing yesterday--> 4.35--> 06/08/14- 3.94 -->3.76-->3.51 today  5.  Klippel Feil Syndrome  6. Restrictive lung physiology- re-do trach on 4/27 per CCM  7. HCAP- resolved   8. Chronic G tube, abd less tense with BMs, KUB without acute issue WBC elevated   9. Hyperglycemia without prior DM per CCM  10. Anemia per CCM possibly due to acute illness  11. Tachycardia, S.T up to 162 pm 06/07/14 was restless and rec'd IV hydralazine. Thought tachy may be IV hydralazine it was stopped and then fentanyl and precedex. Also with fever at that time. LAST 2 PMs continued tachycardia 115- 131, slower, again pt with fever of 103.   12. Hypomagnesium, replaced and yesterday 2.1 by CCM  13. Hypokalemia, down to 2.9 this AM, replacing by CCM.     LOS: 18 days   Time spent with pt. :15 minutes. [facility],LAURA R  Nurse Practitioner Certified Pager [phone] or after 5pm and on weekends call [phone] 06/10/2014, 7:59 AM   History and all data above reviewed.  Patient examined.  I agree with the findings as above.  Events noted.  Status post code.  No not arousable.  The patient exam reveals COR:RRR  ,  Lungs: Decreased Breath sounds ,  Abd: Positive bowel sounds, no rebound no guarding,  Ext No edema  .  All available labs, radiology testing, previous records reviewed. Agree with documented assessment and plan. Now hypotensive after acute respiratory event.  Beta blocker and BiDil on hold for now.   Fayrene FearingJames Lorcan Shelp  12:42 PM  06/10/2014

## 2014-06-10 NOTE — Progress Notes (Signed)
eLink Physician-Brief Progress Note Patient Name: Siler [name] DOB: 10/20/1990 MRN: [id number]   Date of Service  06/10/2014  HPI/Events of Note  Discussed case with Dr Tyson AliasFeinstein. ABG s reviewed.    Recent Labs Lab 06/10/14 1140 06/10/14 1515 06/10/14 1655 06/10/14 1740  PHART 7.278* 7.318* 7.507* 7.332*  PCO2ART 62.4* 63.2* 37.9 60.3*  PO2ART 82.4 48.5* 52.4* 55.8*  HCO3 28.3* 31.5* 29.8* 31.1*  TCO2 27.0 29.5 27.0 29.0  O2SAT 92.4 74.3 86.8 81.6   Most recent pH 7.60  eICU Interventions  Will increase Hx to 6.0 Increase Pmean to 34 ABG in 1 hour     Intervention Category Major Interventions: Respiratory failure - evaluation and management  Jorja Empie S. 06/10/2014, 11:20 PM

## 2014-06-10 NOTE — Progress Notes (Signed)
ABG noted, very poor oxygenation.  Attempted bi-vent but patient desaturated significantly.  Attempted higher PEEP without success.  Placed on PCV with PEEP of 16 cmH2O and O2 sat increased to 89-90%.  Will maintain at current settings and F/U ABG ordered.  Anticipate will be respiratory alkalosis (patient is CO2 retainer due to restrictive lung disease.  Additional CC time 35 min.  Alyson ReedyWesam G. Yacoub, M.D. [facility]. Pager: [phone]. After hours pager: [phone].

## 2014-06-10 NOTE — Progress Notes (Signed)
NUTRITION FOLLOW UP  Intervention:   When medically able, recommend initiation of trickle feeds of Vital 1.5 @ 10 ml/hr via peg tube and advance by 10 mL every 8 hours to to goal rate of 40 ml/hr.  30 ml Prostat BID.    Tube feeding regimen provides 1640 kcal (97% of needs), 95 grams of protein, and 730 ml of H2O.    RD to continue to monitor  Nutrition Dx:   Inadequate oral intake related to inability to eat as evidenced by NPO; ongoing  Goal:   Pt to meet >/= 90% of their estimated nutrition needs; not met  Monitor:   TF initiation and tolerance, weight trend, vent status/settings, labs  Assessment:   24 y.o with long admission in 04/2014 for ARDS, tstomy decannulated 04/27/14, PEG in situ re admitted 4/17 for altered mental status, which improved. Intent was to remove PEG on 4/22. He developed progressive AKI. PCCM consulted for acute resp acidosis on 4/20 after swallow study ? aspiration PMH of club foot, hemiatrophy of RLE, Klippel Feil Syndrome, scoliosis s/p harrington rod surgery (2003), Restrictive lung disease, & Tourette's syndrome / Tic disorder  -Intubated 4/20 -Failed self-extubation 4/23 -Bradycardiac arrest triggered by severe agitation during mouth care with approx 5 mins chest compressions and 2 amps epi 4/24  - Pt with 13 lb wt loss in one month. (9%- significant for time frame) - No signs of fat or muscle wasting.  4/29: - Pt s/p trach placement 4/27.  - Pt with complaints of abdominal distension. MD decreased TF rate to 40 mL/hr.   - Pt having bowel movements. - Pt in + fl balance. Admission weight used for TF calculations.  - Will change TF formula to better fit pt's nutritional needs and promote tolerance.   5/5: - Consult for TF initiation and management - Code blue called at 1041 am. Spoke with NP who said to hold off on TF at this time. Will resume trickle feeds with slow advancement possibly tomorrow. - Pt running fever, estimated nutritional needs  elevated. Will reassess 5/6.   Patient is currently intubated on ventilator support MV: 10.8 L/min Temp (24hrs), Avg:101.6 F (38.7 C), Min:100.5 F (38.1 C), Max:103.2 F (39.6 C)  Propofol: none  Labs and medications reviewed  Na 154  K 3.4  BUN 28  Cr 3.81  Ca 7.3  Albumin 2.5  RD will continue to monitor.   Height: Ht Readings from Last 1 Encounters:  05/29/14 $RemoveB'4\' 11"'vspbwssv$  (1.499 m)    Weight Status:   Wt Readings from Last 1 Encounters:  06/10/14 142 lb 3.2 oz (64.5 kg)    Re-estimated needs (5/2):  Kcal: 1930 Protein: 90-105 g Fluid: 1.8 L/day  Skin: closed incision on neck  Diet Order:     Intake/Output Summary (Last 24 hours) at 06/10/14 1524 Last data filed at 06/10/14 1100  Gross per 24 hour  Intake   2565 ml  Output   2200 ml  Net    365 ml    Last BM: 5/4   Labs:   Recent Labs Lab 06/07/14 0450 06/08/14 0513 06/08/14 0545 06/09/14 0447 06/10/14 0404 06/10/14 1349  NA 147* 144  --  152* 156* 154*  K 3.2* 3.4*  --  3.3* 2.9* 3.4*  CL 106 104  --  107 109 114*  CO2 32 30  --  34* 33* 31  BUN 35* 29*  --  29* 28* 28*  CREATININE 4.35* 3.94*  --  3.76* 3.51*  3.81*  CALCIUM 7.9* 8.0*  --  8.7* 7.9* 7.3*  MG 1.9  --  1.6* 2.1  --   --   PHOS 6.3* 6.8*  --  5.1*  --   --   GLUCOSE 120* 138*  --  118* 104* 130*    CBG (last 3)   Recent Labs  06/09/14 2336 06/10/14 0746 06/10/14 1218  GLUCAP 118* 102* 142*    Scheduled Meds: . antiseptic oral rinse  7 mL Mouth Rinse QID  . artificial tears  1 application Both Eyes 3 times per day  . bacitracin  1 application Topical 3 times per day  . budesonide (PULMICORT) nebulizer solution  0.5 mg Nebulization BID  . chlorhexidine  15 mL Mouth Rinse BID  . cisatracurium  2.5 mg Intravenous Once  . dexmethylphenidate  20 mg Oral QAC breakfast  . docusate  100 mg Per Tube BID  . etomidate      . feeding supplement (VITAL HIGH PROTEIN)  1,000 mL Per Tube Q24H  . fentaNYL (SUBLIMAZE) injection   100 mcg Intravenous Once  . free water  250 mL Per Tube Q6H  . heparin subcutaneous  5,000 Units Subcutaneous 3 times per day  . insulin aspart  0-9 Units Subcutaneous 6 times per day  . ipratropium-albuterol  3 mL Nebulization Q6H  . lidocaine (cardiac) 100 mg/27ml      . midazolam  2 mg Intravenous Once  . pantoprazole sodium  40 mg Per Tube Q1200  . sodium chloride  3 mL Intravenous Q12H  . vancomycin  750 mg Intravenous Q24H    Continuous Infusions: . cisatracurium (NIMBEX) infusion    . dextrose 75 mL/hr at 06/09/14 2159  . fentaNYL infusion INTRAVENOUS    . midazolam (VERSED) infusion    . norepinephrine (LEVOPHED) Adult infusion 2 mcg/min (06/10/14 1316)  . phenylephrine (NEO-SYNEPHRINE) Adult infusion 200 mcg/min (06/10/14 1232)    [name] [place], [place], [place]

## 2014-06-10 NOTE — Progress Notes (Signed)
eLink Physician-Brief Progress Note Patient Name: [name] DOB: 07/20/1990 MRN: [id number]   Date of Service  06/10/2014  HPI/Events of Note    eICU Interventions  IVF changed to d50.45NS @ 50cc/h     Intervention Category Minor Interventions: Electrolytes abnormality - evaluation and management  Dreanna Kyllo S. 06/10/2014, 11:48 PM

## 2014-06-10 NOTE — Progress Notes (Signed)
PULMONARY / CRITICAL CARE MEDICINE   Name: [name] MRN: [id number] DOB: [birth year]    ADMISSION DATE:  06/16/14 CONSULTATION DATE:  06/10/2014  REFERRING MD :  [name]  CHIEF COMPLAINT:  Resp Distress  INITIAL PRESENTATION: 24 y.o with long admission in 04/2014 for ARDS, tstomy decannulated 04/27/14, PEG in situ re-admitted 4/17 for altered mental status, which improved. Intent was to remove PEG on 4/22. He developed progressive AKI. PCCM consulted for acute resp acidosis on 4/20 after swallow study with ? Aspiration.   PMH - club foot, hemiatrophy of RLE, Klippel Feil Syndrome, scoliosis s/p harrington rod surgery (2003), Restrictive lung physiology, Tourette's syndrome.  He is very functional & goes to [place], lives with mom.  STUDIES:  4/18 CT chest/ abd/pelvis:  BLL atx/ pna 4/24 Echocardiogram (performed prior to bradycardic arrest): Moderate LVH, LVEF 30-35%, decreased from LVEF 50-55% 04/05/14  SIGNIFICANT EVENTS: 4/20  swallow eval >> failed 4/20  intubated for resp acidosis 4/23  Failed self extubation due to rapid and severe hypoxemia 4/24  bradycardic arrest triggered by severe agitation during mouth care with approx 5 mins chest compressions and 2 amps epi 4/25  Cardiology consult for LVEF 30-35%, started on bicarb gtt, ENT consulted for trach 4/27  trach per ENT 5/02  Seroquel increased, fent/precedex gtt's weaned.  BMx1 5/04  Tachycardia with fever to 103 overnight  SUBJECTIVE:  Tmax 101.2, desaturations overnight to 70's, FiO2 increased to 70%  VITAL SIGNS: Temp:  [100 F (37.8 C)-103.2 F (39.6 C)] 101.2 F (38.4 C) (05/05 0400) Pulse Rate:  [108-144] 128 (05/05 0600) Resp:  [18-29] 20 (05/05 0600) BP: (86-175)/(19-106) 157/99 mmHg (05/05 0600) SpO2:  [87 %-97 %] 90 % (05/05 0600) FiO2 (%):  [30 %-100 %] 70 % (05/05 0636) Weight:  [142 lb 3.2 oz (64.5 kg)] 142 lb 3.2 oz (64.5 kg) (05/05 0326)   HEMODYNAMICS:     VENTILATOR SETTINGS: Vent Mode:  [-]  PRVC FiO2 (%):  [30 %-100 %] 70 % Set Rate:  [20 bmp] 20 bmp Vt Set:  [450 mL] 450 mL PEEP:  [5 cmH20] 5 cmH20 Plateau Pressure:  [29 cmH20-40 cmH20] 38 cmH20   INTAKE / OUTPUT:  Intake/Output Summary (Last 24 hours) at 06/10/14 0817 Last data filed at 06/10/14 0600  Gross per 24 hour  Intake   2735 ml  Output   1975 ml  Net    760 ml    PHYSICAL EXAMINATION: Gen: chronically ill young adult in NAD HENT: NCAT, trach in place, c/d/i PULM: even/non-labored on vent, lungs bilaterally coarse  CV: RRR, no clear mgr GI: distended, bsx4 hypoactive, PEG c/d/i Derm: no breakdown MSK: atrophy of bilateral legs, normal bulk/tone arms with pitting edema Neuro: Awake, MAE  LABS:  CBC  Recent Labs Lab 06/08/14 0545 06/09/14 0447 06/10/14 0404  WBC 18.7* 15.9* 15.1*  HGB 10.7* 10.4* 10.2*  HCT 35.4* 33.9* 34.6*  PLT 311 330 282   Coag's No results for input(s): APTT, INR in the last 168 hours.   BMET  Recent Labs Lab 06/08/14 0513 06/09/14 0447 06/10/14 0404  NA 144 152* 156*  K 3.4* 3.3* 2.9*  CL 104 107 109  CO2 30 34* 33*  BUN 29* 29* 28*  CREATININE 3.94* 3.76* 3.51*  GLUCOSE 138* 118* 104*   Electrolytes  Recent Labs Lab 06/07/14 0450 06/08/14 0513 06/08/14 0545 06/09/14 0447 06/10/14 0404  CALCIUM 7.9* 8.0*  --  8.7* 7.9*  MG 1.9  --  1.6* 2.1  --  PHOS 6.3* 6.8*  --  5.1*  --    Sepsis Markers No results for input(s): LATICACIDVEN, PROCALCITON, O2SATVEN in the last 168 hours.   ABG No results for input(s): PHART, PCO2ART, PO2ART in the last 168 hours. Liver Enzymes  Recent Labs Lab 06/04/14 0500  06/07/14 0450 06/08/14 0513 06/09/14 0447  AST 21  --   --   --   --   ALT 15  --   --   --   --   ALKPHOS 52  --   --   --   --   BILITOT 0.5  --   --   --   --   ALBUMIN 2.8*  < > 2.6* 2.8* 2.9*  < > = values in this interval not displayed.   Cardiac Enzymes No results for input(s): TROPONINI, PROBNP in the last 168 hours.    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 Status - re-do tracheostomy by ENT 4/27 Wheezing - improved with inhaled steroid P:   SBT/WUA daily Wean PEEP/FiO2 for sats > 92% Goal to advance to ATC Pulmicort + Duoneb Pulmonary hygiene as able, mobilize Trend CXR intermittently  SLP consult for PMV  CARDIOVASCULAR R IJ CVL 4/20 >> 5/4 A:  Tachycardia / SVT - overnight 5/3, 5/4 Persistent hypotension -  4/23, likely due to acidosis, resolved Bradycardic arrest 4/24  Cardiomyopathy - new finding by Echo 4/24, presumably due to acidosis Acute CHF P:  Bidil, coreg per cardiology recs D/C central line 5/4, establish PIV then consider PICC in 24-48 hours ICU monitoring  PRN lopressor, labetalol  RENAL A:   AKI - nonoliguric presumably ATN secondary to IV contrast vs ischemic ATN,  improving Anasarca - due to volume administered,  improved Hypokalemia Hypernatremia  Hypomagnesemia  P:   Reduce D5 @ 75 ml/hr  Free water, 250ml Q6 Renal signed off 5/2 Hold further lasix for now Replace electrolytes as indicated, KCL 20 x1 on 5/4  GASTROINTESTINAL A:   Chronic G tube Bloating + High Gastric Residuals, new 4/29 > likely volume of tube feeds is too high, KUB images reviewed from 4/29 & 5/2, OK Constipation Protein Calorie Malnutrition - albumin 2.6 P:   SUP: enteral PPI Resume TF 5/5 PRN dulcolax and miralax 4/23 SLP consult  HEMATOLOGIC A:   Anemia - suspect related to critical illness P:  DVT px: SQ heparin Monitor CBC intermittently Transfuse  per usual ICU guidelines   INFECTIOUS A:   HCAP - on admission, resolved Fever - tmax 101.2 on 5/3, 103 on 5/4 P:   Monitor fever curve / WBC Re-culture  D/c central line once PICC line placed Assess PCT trend  BCx2 5/4 >> UC 5/4 >>  Sputum 5/5 >>  Vanco 5/4 (fever) >>   ENDOCRINE A:   Hyperglycemia without prior dx of DM Hypoglycemia 4/27 due to decreased po intake P:   Sensitive SSI D5W as above  NEUROLOGIC A:   Klippel Feil Syndrome ADD Severe agitation - causing vent asynchrony and self extubation, history of Tourette's, ADHD,  had EPS reaction to haldol last hospital stay, Depakote helping; precedex appears to be ineffective for him P:   RASS goal: 0 Continue fentanyl / versed PRN Continue Focalin  Continue depakote (helped during last hospitalization as well for ICU delirium) Seroquel 200 mg BID, monitor response  FAMILY  - Updates: Called mother 5/4 Christy Gentles([name] [phone], Salena Saner [phone]), no answer and left message on voicemail   Canary BrimBrandi Vilma Will, NP-C [facility] Pgr: [phone] or [phone]

## 2014-06-11 ENCOUNTER — Inpatient Hospital Stay (HOSPITAL_COMMUNITY): Payer: Medicaid Other

## 2014-06-11 DIAGNOSIS — J80 Acute respiratory distress syndrome: Secondary | ICD-10-CM

## 2014-06-11 DIAGNOSIS — I5023 Acute on chronic systolic (congestive) heart failure: Secondary | ICD-10-CM

## 2014-06-11 LAB — BLOOD GAS, ARTERIAL
ACID-BASE EXCESS: 4.5 mmol/L — AB (ref 0.0–2.0)
AMPLITUDE: 5
AMPLITUDE: 6
AMPLITUDE: 8
Acid-Base Excess: 4.2 mmol/L — ABNORMAL HIGH (ref 0.0–2.0)
Acid-Base Excess: 4.5 mmol/L — ABNORMAL HIGH (ref 0.0–2.0)
Acid-Base Excess: 5.1 mmol/L — ABNORMAL HIGH (ref 0.0–2.0)
Acid-Base Excess: 5.7 mmol/L — ABNORMAL HIGH (ref 0.0–2.0)
Acid-Base Excess: 5.8 mmol/L — ABNORMAL HIGH (ref 0.0–2.0)
Acid-Base Excess: 6.3 mmol/L — ABNORMAL HIGH (ref 0.0–2.0)
Amplitude: 6.5
Amplitude: 6.5
Amplitude: 6.5
BICARBONATE: 25.6 meq/L — AB (ref 20.0–24.0)
BICARBONATE: 26.1 meq/L — AB (ref 20.0–24.0)
BICARBONATE: 26.3 meq/L — AB (ref 20.0–24.0)
BICARBONATE: 27.3 meq/L — AB (ref 20.0–24.0)
Bicarbonate: 27.8 mEq/L — ABNORMAL HIGH (ref 20.0–24.0)
Bicarbonate: 26.3 mEq/L — ABNORMAL HIGH (ref 20.0–24.0)
Bicarbonate: 24.9 mEq/L — ABNORMAL HIGH (ref 20.0–24.0)
DRAWN BY: 232811
DRAWN BY: 232811
DRAWN BY: 232811
DRAWN BY: 276051
DRAWN BY: 276051
Drawn by: 232811
Drawn by: 276051
FIO2: 1 %
FIO2: 1 %
FIO2: 1 %
FIO2: 1 %
FIO2: 1 %
FIO2: 1 %
FIO2: 1 %
HERTZ: 6
HERTZ: 8
HERTZ: 8
Hertz: 5
Hertz: 6
Hertz: 6
Hertz: 6
MAP: 35 cmH2O
MAP: 35 cmH2O
Map: 30.5 cmH20
Map: 34 cmH20
Map: 35 cmH20
Map: 35 cmH20
Map: 35 cmH20
O2 SAT: 82 %
O2 Saturation: 76 %
O2 Saturation: 78 %
O2 Saturation: 78.8 %
O2 Saturation: 82.6 %
O2 Saturation: 82.6 %
O2 Saturation: 86.9 %
PATIENT TEMPERATURE: 100
PATIENT TEMPERATURE: 98.4
PATIENT TEMPERATURE: 99.1
PCO2 ART: 25.1 mmHg — AB (ref 35.0–45.0)
PH ART: 7.553 — AB (ref 7.350–7.450)
PH ART: 7.604 — AB (ref 7.350–7.450)
PH ART: 7.621 — AB (ref 7.350–7.450)
PH ART: 7.63 — AB (ref 7.350–7.450)
PO2 ART: 40.6 mmHg — AB (ref 80.0–100.0)
PO2 ART: 44.1 mmHg — AB (ref 80.0–100.0)
PO2 ART: 43.1 mmHg — AB (ref 80.0–100.0)
Patient temperature: 100.2
Patient temperature: 99.3
Patient temperature: 99.1
Patient temperature: 99.1
TCO2: 21.5 mmol/L (ref 0–100)
TCO2: 22.7 mmol/L (ref 0–100)
TCO2: 23 mmol/L (ref 0–100)
TCO2: 23.2 mmol/L (ref 0–100)
TCO2: 23.6 mmol/L (ref 0–100)
TCO2: 24.1 mmol/L (ref 0–100)
TCO2: 25 mmol/L (ref 0–100)
pCO2 arterial: 24.6 mmHg — ABNORMAL LOW (ref 35.0–45.0)
pCO2 arterial: 25.5 mmHg — ABNORMAL LOW (ref 35.0–45.0)
pCO2 arterial: 26.3 mmHg — ABNORMAL LOW (ref 35.0–45.0)
pCO2 arterial: 28.2 mmHg — ABNORMAL LOW (ref 35.0–45.0)
pCO2 arterial: 29.9 mmHg — ABNORMAL LOW (ref 35.0–45.0)
pCO2 arterial: 35.1 mmHg (ref 35.0–45.0)
pH, Arterial: 7.619 (ref 7.350–7.450)
pH, Arterial: 7.512 — ABNORMAL HIGH (ref 7.350–7.450)
pH, Arterial: 7.567 — ABNORMAL HIGH (ref 7.350–7.450)
pO2, Arterial: 41.1 mmHg — ABNORMAL LOW (ref 80.0–100.0)
pO2, Arterial: 41.4 mmHg — ABNORMAL LOW (ref 80.0–100.0)
pO2, Arterial: 47.6 mmHg — ABNORMAL LOW (ref 80.0–100.0)
pO2, Arterial: 48 mmHg — ABNORMAL LOW (ref 80.0–100.0)

## 2014-06-11 LAB — BASIC METABOLIC PANEL
ANION GAP: 11 (ref 5–15)
BUN: 34 mg/dL — ABNORMAL HIGH (ref 6–20)
CALCIUM: 7.3 mg/dL — AB (ref 8.9–10.3)
CO2: 26 mmol/L (ref 22–32)
Chloride: 117 mmol/L — ABNORMAL HIGH (ref 101–111)
Creatinine, Ser: 3.55 mg/dL — ABNORMAL HIGH (ref 0.61–1.24)
GFR calc non Af Amer: 23 mL/min — ABNORMAL LOW (ref >60)
GFR, EST AFRICAN AMERICAN: 26 mL/min — AB (ref >60)
Glucose, Bld: 110 mg/dL — ABNORMAL HIGH (ref 70–99)
Potassium: 2.3 mmol/L — CL (ref 3.5–5.1)
Sodium: 154 mmol/L — ABNORMAL HIGH (ref 135–145)

## 2014-06-11 LAB — CBC
HCT: 36 % — ABNORMAL LOW (ref 39.0–52.0)
Hemoglobin: 10.9 g/dL — ABNORMAL LOW (ref 13.0–17.0)
MCH: 29.3 pg (ref 26.0–34.0)
MCHC: 30.3 g/dL (ref 30.0–36.0)
MCV: 96.8 fL (ref 78.0–100.0)
PLATELETS: 224 10*3/uL (ref 150–400)
RBC: 3.72 MIL/uL — ABNORMAL LOW (ref 4.22–5.81)
RDW: 15.2 % (ref 11.5–15.5)
WBC: 14.3 10*3/uL — AB (ref 4.0–10.5)

## 2014-06-11 LAB — GLUCOSE, CAPILLARY
GLUCOSE-CAPILLARY: 84 mg/dL (ref 70–99)
GLUCOSE-CAPILLARY: 97 mg/dL (ref 70–99)
Glucose-Capillary: 92 mg/dL (ref 70–99)
Glucose-Capillary: 105 mg/dL — ABNORMAL HIGH (ref 70–99)

## 2014-06-11 LAB — PHOSPHORUS: PHOSPHORUS: 1.8 mg/dL — AB (ref 2.5–4.6)

## 2014-06-11 LAB — MAGNESIUM: Magnesium: 1.6 mg/dL — ABNORMAL LOW (ref 1.7–2.4)

## 2014-06-11 MED ORDER — POTASSIUM CHLORIDE 10 MEQ/50ML IV SOLN
10.0000 meq | INTRAVENOUS | Status: AC
  Administered 2014-06-11 (×6): 10 meq via INTRAVENOUS
  Filled 2014-06-11 (×4): qty 50

## 2014-06-11 MED ORDER — DEXMETHYLPHENIDATE HCL 5 MG PO TABS: 10.0000 mg | ORAL_TABLET | Freq: Two times a day (BID) | ORAL | Status: DC

## 2014-06-11 MED ORDER — ATROPINE SULFATE 1 % OP SOLN
2.0000 [drp] | Freq: Four times a day (QID) | OPHTHALMIC | Status: DC | PRN
  Filled 2014-06-11: qty 2

## 2014-06-11 MED ORDER — PIPERACILLIN-TAZOBACTAM 3.375 G IVPB
3.3750 g | Freq: Three times a day (TID) | INTRAVENOUS | Status: DC
  Administered 2014-06-11: 3.375 g via INTRAVENOUS
  Filled 2014-06-11: qty 50

## 2014-06-12 LAB — CULTURE, RESPIRATORY

## 2014-06-12 LAB — CULTURE, RESPIRATORY W GRAM STAIN

## 2014-06-14 NOTE — Discharge Summary (Addendum)
PULMONARY / CRITICAL CARE MEDICINE   Name: [name] MRN: [id number] DOB: 10/15/1990    ADMISSION DATE:  05/25/2014 CONSULTATION DATE:  06/14/2014  REFERRING MD :  [name]  CHIEF COMPLAINT:  Resp Distress  INITIAL PRESENTATION: 24 y.o with long admission in 04/2014 for ARDS, tstomy decannulated 04/27/14, PEG in situ re-admitted 4/17 for altered mental status, which improved. Intent was to remove PEG on 4/22. He developed progressive AKI. PCCM consulted for acute resp acidosis on 4/20 after swallow study with ? Aspiration.   PMH - club foot, hemiatrophy of RLE, Klippel Feil Syndrome, scoliosis s/p harrington rod surgery (2003), Restrictive lung physiology, Tourette's syndrome.  [facility] is very functional & [facility] [employer], lives with mom.  STUDIES:  4/18 CT chest/ abd/pelvis:  BLL atx/ pna 4/24 Echocardiogram (performed prior to bradycardic arrest): Moderate LVH, LVEF 30-35%, decreased from LVEF 50-55% 04/05/14  SIGNIFICANT EVENTS: 4/20  swallow eval >> failed 4/20  intubated for resp acidosis 4/23  Failed self extubation due to rapid and severe hypoxemia 4/24  bradycardic arrest triggered by severe agitation during mouth care with approx 5 mins chest compressions and 2 amps epi 4/25  Cardiology consult for LVEF 30-35%, started on bicarb gtt, ENT consulted for trach 4/27  trach per ENT 5/02  Seroquel increased, fent/precedex gtt's weaned.  BMx1 5/04  Tachycardia with fever to 103 overnight 5/5 6min PEA arrest >> severe ARDS >> HFO   CXR:  5/06 >> tubes in good position, BL airspace opacities   ASSESSMENT / PLAN/ Course:  PULMONARY OETT 4/20 >> 4/27  Trach 4/27 >>  A: Acute Hypercarbic  Resp Failure - in setting of HCAP and renal failure, improving L Basilar Consolidation  Tracheostomy Status - re-do tracheostomy by ENT 4/27 ARDS 5/5 post arrest, required HFO   CARDIOVASCULAR R IJ CVL 4/20 >> 5/4 Lt [place] 5/5 >> A:  Tachycardia / SVT - overnight 5/3, 5/4 Persistent hypotension -   4/23, likely due to acidosis, resolved Bradycardic arrest 4/24 , 5/5 ?due to hypoxia Cardiomyopathy - new finding by Echo 4/24, presumably due to acidosis Acute systolic CHF   RENAL A:   AKI - nonoliguric presumably ATN secondary to IV contrast vs ischemic ATN,  Was improving until arrest on 5/5, expect ATN  Anasarca - due to volume administered,  improved Hypokalemia Hypernatremia  Hypomagnesemia       GASTROINTESTINAL A:   Chronic G tube Bloating + High Gastric Residuals, new 4/29 > likely volume of tube feeds is too high, KUB images reviewed from 4/29 & 5/2, OK Constipation Protein Calorie Malnutrition - albumin 2.6    HEMATOLOGIC A:   Anemia - suspect related to critical illness   INFECTIOUS A:   HCAP - on admission, resolved Fever - tmax 101.2 on 5/3, 103 on 5/4 P:     BCx2 5/4 >>ng Sputum 5/5 >>klebsiella  Vanco 5/4 (fever) >>  Zosyn 5/6 >>  ENDOCRINE A:   Hyperglycemia without prior dx of DM Hypoglycemia 4/27 due to decreased po intake   NEUROLOGIC A:   Klippel Feil Syndrome ADD Severe agitation - causing vent asynchrony and self extubation, history of Tourette's, ADHD, had EPS reaction to haldol last hospital stay, Depakote helping; precedex appears to be ineffective for him   FAMILY  - Updates: Updated  mother 5/5 Christy Gentles([name] [phone], C [phone])  No CPR, no cardioversion on 5/5  Summary - Unfortunately he suffered a cardiac arrest and has difficulty with oxygenation (despite HFOV). Hospital course has been  complicated by ARDS, acute renal failure, prior cardiac arrest on 4/24, agitation/delirium, fever, SVT, constipation, anemia, and electrolyte disturbances. Prior to this admission, he had a prolonged hospitalization due to HCAP requiring tracheostomy (subsequently decannulated). Family requested to proceed toward comfort care.Life support was withdrawn & he passed away on 5/6   Cause of death - ARDS, HCAP, acute resp failure,  AKI  Oretha MilchALVA,Jacquelene Kopecky V. MD

## 2014-06-15 LAB — CULTURE, BLOOD (ROUTINE X 2)
CULTURE: NO GROWTH
Culture: NO GROWTH

## 2014-06-21 ENCOUNTER — Telehealth: Payer: Self-pay

## 2014-06-21 NOTE — Telephone Encounter (Signed)
On 06/21/2014 I received a death certificate from [employer]. This patient is a patient of Doctor Vassie LollAlva. This death certificate is for burial. I am taking the death certificate to [facility] tomorrow morning for Doctor Vassie Lolllva to sign death certificate tomorrow night and pick it up on Wednesday am. On 06/23/2014 I received the death certificate back completed from Doctor Vassie LollAlva. I called the funeral home to let them know the death certificate was ready for pick up.

## 2014-07-07 NOTE — Progress Notes (Signed)
PULMONARY / CRITICAL CARE MEDICINE   Name: [name] MRN: [id number] DOB: 03/08/1990    ADMISSION DATE:  05/30/2014 CONSULTATION DATE:  07/05/2014  REFERRING MD :  [name]  CHIEF COMPLAINT:  Resp Distress  INITIAL PRESENTATION: 24 y.o with long admission in 04/2014 for ARDS, tstomy decannulated 04/27/14, PEG in situ re-admitted 4/17 for altered mental status, which improved. Intent was to remove PEG on 4/22. He developed progressive AKI. PCCM consulted for acute resp acidosis on 4/20 after swallow study with ? Aspiration.   PMH - club foot, hemiatrophy of RLE, Klippel Feil Syndrome, scoliosis s/p harrington rod surgery (2003), Restrictive lung physiology, Tourette's syndrome.  [facility] is very functional & [facility], lives with mom.  STUDIES:  4/18 CT chest/ abd/pelvis:  BLL atx/ pna 4/24 Echocardiogram (performed prior to bradycardic arrest): Moderate LVH, LVEF 30-35%, decreased from LVEF 50-55% 04/05/14  SIGNIFICANT EVENTS: 4/20  swallow eval >> failed 4/20  intubated for resp acidosis 4/23  Failed self extubation due to rapid and severe hypoxemia 4/24  bradycardic arrest triggered by severe agitation during mouth care with approx 5 mins chest compressions and 2 amps epi 4/25  Cardiology consult for LVEF 30-35%, started on bicarb gtt, ENT consulted for trach 4/27  trach per ENT 5/02  Seroquel increased, fent/precedex gtt's weaned.  BMx1 5/04  Tachycardia with fever to 103 overnight 5/5 6min PEA arrest >> severe ARDS >> HFO  SUBJECTIVE:   HFO initiated last 24h - remains difficult to oxygenate Febrile Off pressors UO dropping   VITAL SIGNS: Temp:  [99 F (37.2 C)-101.9 F (38.8 C)] 99 F (37.2 C) (05/06 0400) Pulse Rate:  [35-133] 108 (05/06 0700) Resp:  [0-45] 0 (05/06 0700) BP: (59-180)/(19-106) 127/74 mmHg (05/06 0700) SpO2:  [67 %-100 %] 83 % (05/06 0700) FiO2 (%):  [70 %-100 %] 100 % (05/06 0613) Weight:  [146 lb 2.6 oz (66.3 kg)] 146 lb 2.6 oz (66.3 kg) (05/06 0500)    HEMODYNAMICS: CVP:  [21 mmHg] 21 mmHg   VENTILATOR SETTINGS: Vent Mode:  [-] Other (Comment) FiO2 (%):  [70 %-100 %] 100 % Set Rate:  [16 bmp-35 bmp] 30 bmp Vt Set:  [450 mL] 450 mL PEEP:  [8 cmH20-20 cmH20] 20 cmH20 Mean Airway Pressure:  [30.5 cmH2O-35 cmH2O] 35 cmH2O Plateau Pressure:  [32 cmH20-43 cmH20] 32 cmH20   INTAKE / OUTPUT:  Intake/Output Summary (Last 24 hours) at December 18, 2014 0745 Last data filed at December 18, 2014 0700  Gross per 24 hour  Intake 3658.98 ml  Output   3275 ml  Net 383.98 ml    PHYSICAL EXAMINATION: Gen: chronically ill young adult HENT: NCAT, trach in place, c/d/i PULM: even/non-labored on vent, lungs bilaterally coarse  CV: RRR, no clear mgr GI: distended, bsx4 hypoactive, PEG c/d/i Derm: no breakdown MSK: atrophy of bilateral legs, normal bulk/tone arms with pitting edema Neuro: sedated , paralysed  LABS:  CBC  Recent Labs Lab 06/09/14 0447 06/10/14 0404 December 18, 2014 0347  WBC 15.9* 15.1* 14.3*  HGB 10.4* 10.2* 10.9*  HCT 33.9* 34.6* 36.0*  PLT 330 282 224   Coag's No results for input(s): APTT, INR in the last 168 hours.   BMET  Recent Labs Lab 06/10/14 0404 06/10/14 1349 December 18, 2014 0347  NA 156* 154* 154*  K 2.9* 3.4* 2.3*  CL 109 114* 117*  CO2 33* 31 26  BUN 28* 28* 34*  CREATININE 3.51* 3.81* 3.55*  GLUCOSE 104* 130* 110*   Electrolytes  Recent Labs Lab 06/08/14 0513 06/08/14 0545  06/09/14 0447 06/10/14 0404 06/10/14 1349 06/19/2014 0347  CALCIUM 8.0*  --  8.7* 7.9* 7.3* 7.3*  MG  --  1.6* 2.1  --   --  1.6*  PHOS 6.8*  --  5.1*  --   --  1.8*   Sepsis Markers  Recent Labs Lab 06/10/14 0400  PROCALCITON 5.55     ABG  Recent Labs Lab 06/10/14 2245 07/01/2014 0015 06/16/2014 0550  PHART 7.619* 7.630* 7.512*  PCO2ART 26.3* 24.6* 35.1  PO2ART 40.6* 47.6* 48.0*   Liver Enzymes  Recent Labs Lab 06/08/14 0513 06/09/14 0447 06/10/14 1349  AST  --   --  122*  ALT  --   --  33  ALKPHOS  --   --  121   BILITOT  --   --  0.4  ALBUMIN 2.8* 2.9* 2.5*     Cardiac Enzymes No results for input(s): TROPONINI, PROBNP in the last 168 hours.   Glucose  Recent Labs Lab 06/10/14 0746 06/10/14 1218 06/10/14 1807 06/10/14 2020 06/10/14 2340 06/13/2014 0146  GLUCAP 102* 142* 106* 120* 78 92    CXR:  5/06 >> tubes in good position, BL airspace opacities   ASSESSMENT / PLAN:  PULMONARY OETT 4/20 >> 4/27  Trach 4/27 >>  A: Acute Hypercarbic  Resp Failure - in setting of HCAP and renal failure, improving L Basilar Consolidation  Tracheostomy Status - re-do tracheostomy by ENT 4/27 ARDS 5/5 post arrest on HFO P:   Increase mean airway pr to 35 , to improve pO2 - will accept 50 & above Pulmicort + Duoneb  CXR q 12h while on HFO Ct nimnbex  CARDIOVASCULAR R IJ CVL 4/20 >> 5/4 Lt [place] 5/5 >> A:  Tachycardia / SVT - overnight 5/3, 5/4 Persistent hypotension -  4/23, likely due to acidosis, resolved Bradycardic arrest 4/24 , 5/5 ?due to hypoxia Cardiomyopathy - new finding by Echo 4/24, presumably due to acidosis Acute CHF P:  Bidil, coreg on hold   PRN lopressor, labetalol  RENAL A:   AKI - nonoliguric presumably ATN secondary to IV contrast vs ischemic ATN,  Was improving until arrest on 5/5, expect ATN  Anasarca - due to volume administered,  improved Hypokalemia Hypernatremia  Hypomagnesemia  P:    D5W @ 75 ml/hr  Free water, 250ml Q6 Renal signed off 5/2 Hold further lasix for now Replace electrolytes as indicated  GASTROINTESTINAL A:   Chronic G tube Bloating + High Gastric Residuals, new 4/29 > likely volume of tube feeds is too high, KUB images reviewed from 4/29 & 5/2, OK Constipation Protein Calorie Malnutrition - albumin 2.6 P:   SUP: enteral PPI Hold TF while on HFO PRN dulcolax and miralax 4/23   HEMATOLOGIC A:   Anemia - suspect related to critical illness P:  DVT px: SQ heparin Monitor CBC intermittently Transfuse per usual ICU guidelines    INFECTIOUS A:   HCAP - on admission, resolved Fever - tmax 101.2 on 5/3, 103 on 5/4 P:   Monitor fever curve / WBC Re-culture  D/c central line once PICC line placed Assess PCT trend  BCx2 5/4 >> UC 5/4 >>  Sputum 5/5 >>ng  Vanco 5/4 (fever) >>  Zosyn 5/6 >>  ENDOCRINE A:   Hyperglycemia without prior dx of DM Hypoglycemia 4/27 due to decreased po intake P:   Sensitive SSI D5W as above  NEUROLOGIC A:   Klippel Feil Syndrome ADD Severe agitation - causing vent asynchrony and self extubation, history  of Tourette's, ADHD, had EPS reaction to haldol last hospital stay, Depakote helping; precedex appears to be ineffective for him P:   RASS goal: -5 Continue fentanyl / versed gtt, BIS may not be accurate on HFO Continue Focalin  Dc depakote (helped during last hospitalization as well for ICU delirium) Seroquel 200 mg BID, monitor response  FAMILY  - Updates: Updated  mother 5/5 Christy Gentles [phone], C [phone])  No CPR, no cardioversion on 5/5  Summary - Unfortunately refractory ARDS requiring HFO, failed conventional ventilation, titrate mean airway pr to improve pO2, if more organ failure -may have to increse level of palliation  The patient is critically ill with multiple organ systems failure and requires high complexity decision making for assessment and support, frequent evaluation and titration of therapies, application of advanced monitoring technologies and extensive interpretation of multiple databases. Critical Care Time devoted to patient care services described in this note independent of APP time is 35 minutes.    Oretha Milch MD

## 2014-07-07 NOTE — Progress Notes (Addendum)
Extensive discussion with mother and father at bedside regarding patients current status.  He has had an unfortunate progressive decline despite escalating support.   The past 24 hours he has suffered a cardiac arrest and has difficulty with oxygenation (despite HFOV).  Hospital course has been complicated by ARDS, acute renal failure, prior cardiac arrest on 4/24, agitation/delirium, fever, SVT, constipation, anemia, and electrolyte disturbances.  Prior to this admission, he had a prolonged hospitalization due to HCAP requiring tracheostomy (subsequently decannulated).  Family requests to proceed toward comfort care.  Discussed plan of care with Attending MD.     Plan: -Discontinue paralytics now, allow for metabolization of drug.  Q30 min - 1 hour TOF until 4/4 achieved.   -[facility] -Continue current sedation / pain medications for now -Once TOF 4/4 achieved, may transition to ATC for comfort -Full DNR -Atropine gtts for secretions  -No further labs, xrays etc.  Comfort focused care.  -Family offered chaplain support    Canary BrimBrandi Ollis, NP-C [facility] Pgr: [phone] or [phone]

## 2014-07-07 NOTE — Evaluation (Signed)
SLP Cancellation Note  Patient Details Name: [name] MRN: [id number] DOB: 04/01/1990   Cancelled treatment:       Reason Eval/Treat Not Completed: Patient not medically ready   Donavan Burnetamara Denese Mentink, MS [facility] SLP [phone]

## 2014-07-07 NOTE — Progress Notes (Signed)
Repeat abg drawn/received at 0231.  Vent settings: HFOV, MAP:35, Hz:6, fio2:100%, Amp: 6.5.  Temp corrected abg results are as follows:  Ph:7.621 Pco2: 25.5 Po2: 44.1 Hco3: 26.3  This RT unable to transfer results from flexilab system into epic.  Results [facility].  This RT will forward abg to pulmonary staff later this morning to enter results in epic.  Dr Delton CoombesByrum made aware and ok with this.

## 2014-07-07 NOTE — Progress Notes (Addendum)
NUTRITION FOLLOW UP  Intervention:   IF TF to be resumed while on HFOV, recommend that feeding tube be placed post-pyloric.   When medically able, recommend initiation of Vital 1.5 @ 10 ml/hr via peg tube and advance by 10 mL every 8 hours to to goal rate of 40 ml/hr vs trickle feeds.   30 ml Prostat BID.    Tube feeding regimen provides 1640 kcal (90% of needs), 95 grams of protein, and 730 ml of H2O.    RD to continue to monitor  Nutrition Dx:   Inadequate oral intake related to inability to eat as evidenced by NPO; ongoing  Goal:   Pt to meet >/= 90% of their estimated nutrition needs; not met  Monitor:   TF initiation and tolerance, weight trend, vent status/settings, labs  Assessment:   24 y.o with long admission in 04/2014 for ARDS, tstomy decannulated 04/27/14, PEG in situ re admitted 4/17 for altered mental status, which improved. Intent was to remove PEG on 4/22. He developed progressive AKI. PCCM consulted for acute resp acidosis on 4/20 after swallow study ? aspiration PMH of club foot, hemiatrophy of RLE, Klippel Feil Syndrome, scoliosis s/p harrington rod surgery (2003), Restrictive lung disease, & Tourette's syndrome / Tic disorder  -Intubated 4/20 -Failed self-extubation 4/23 -Bradycardiac arrest triggered by severe agitation during mouth care with approx 5 mins chest compressions and 2 amps epi 4/24 - Pt s/p trach placement 4/27.   5/6: - ARDS 5/5 post arrest on HFOV - Per NP, will hold off on TF at this time.  - Pt having bowel movements (most recently 5/5) - Pt has been without nutrition since 5/1.  - If TF to be resumed while pt on HFOV, recommend post-pyloric feeding tube.  - RD to re-estimate nutritional needs at follow-up. - Pt no longer with fever.  RD will continue to monitor.   Labs and medications reviewed  Na 154  K 2.3  BUN 34  Cr 3.55  Ca 7.3  Phos 1.8  Mg 1.6  Height: Ht Readings from Last 1 Encounters:  05/29/14 $RemoveB'4\' 11"'NcvxEosi$  (1.499 m)     Weight Status:   Wt Readings from Last 1 Encounters:  2014/07/01 146 lb 2.6 oz (66.3 kg)    Re-estimated needs (5/2):  Kcal: 1828 Protein: 90-105 g Fluid: 1.8 L/day  Skin: closed incision on neck  Diet Order:     Intake/Output Summary (Last 24 hours) at 07/01/14 1319 Last data filed at 07/01/14 1300  Gross per 24 hour  Intake 4547.58 ml  Output   2285 ml  Net 2262.58 ml    Last BM: 5/5   Labs:   Recent Labs Lab 06/08/14 0513 06/08/14 0545 06/09/14 0447 06/10/14 0404 06/10/14 1349 Jul 01, 2014 0347  NA 144  --  152* 156* 154* 154*  K 3.4*  --  3.3* 2.9* 3.4* 2.3*  CL 104  --  107 109 114* 117*  CO2 30  --  34* 33* 31 26  BUN 29*  --  29* 28* 28* 34*  CREATININE 3.94*  --  3.76* 3.51* 3.81* 3.55*  CALCIUM 8.0*  --  8.7* 7.9* 7.3* 7.3*  MG  --  1.6* 2.1  --   --  1.6*  PHOS 6.8*  --  5.1*  --   --  1.8*  GLUCOSE 138*  --  118* 104* 130* 110*    CBG (last 3)   Recent Labs  06/10/14 2340 07-01-2014 0146 2014/07/01 0750  GLUCAP 78 92  84    Scheduled Meds: . antiseptic oral rinse  7 mL Mouth Rinse QID  . artificial tears  1 application Both Eyes 3 times per day  . bacitracin  1 application Topical 3 times per day  . budesonide (PULMICORT) nebulizer solution  0.5 mg Nebulization BID  . chlorhexidine  15 mL Mouth Rinse BID  . Crash Cart Bulk Charge  1 each Does not apply Once  . dexmethylphenidate  10 mg Per Tube BID  . docusate  100 mg Per Tube BID  . feeding supplement (VITAL HIGH PROTEIN)  1,000 mL Per Tube Q24H  . fentaNYL (SUBLIMAZE) injection  100 mcg Intravenous Once  . free water  250 mL Per Tube Q6H  . heparin subcutaneous  5,000 Units Subcutaneous 3 times per day  . insulin aspart  0-9 Units Subcutaneous 6 times per day  . ipratropium-albuterol  3 mL Nebulization Q6H  . midazolam  2 mg Intravenous Once  . pantoprazole sodium  40 mg Per Tube Q1200  . piperacillin-tazobactam (ZOSYN)  IV  3.375 g Intravenous Q8H  . sodium chloride  3 mL  Intravenous Q12H  . [START ON 06/12/2014] vancomycin  1,000 mg Intravenous Q48H    Continuous Infusions: . cisatracurium (NIMBEX) infusion 3.5 mcg/kg/min (2014-06-16 0700)  . dextrose 75 mL/hr at 16-Jun-2014 0908  . fentaNYL infusion INTRAVENOUS 400 mcg/hr (Jun 16, 2014 1057)  . midazolam (VERSED) infusion 8 mg/hr (Jun 16, 2014 0754)  . norepinephrine (LEVOPHED) Adult infusion    . phenylephrine (NEO-SYNEPHRINE) Adult infusion Stopped (06/10/14 2121)    [name], [name], [name]

## 2014-07-07 NOTE — Progress Notes (Signed)
eLink Physician-Brief Progress Note Patient Name: [name] DOB: 06/04/1990 MRN: [id number]   Date of Service  06/16/2014  HPI/Events of Note  Pt remains alkalotic, but improved since decreasing cuff pressure and power. Having difficulty maintaining stable mean pressures with cuff down.   eICU Interventions       Intervention Category Major Interventions: Respiratory failure - evaluation and management  Aruna Nestler S. 06/09/2014, 6:09 AM

## 2014-07-07 NOTE — Progress Notes (Signed)
9 Days Post-Op Procedure(s) (LRB): TRACHEOSTOMY (N/A) Subjective: Patient examined, most recent CT scan of chest and echocardiogram reviewed personally. Situation discussed with patient's family  I was asked to evaluate this patient for potential ECMO support. In summary the patient is a 24 year old who is currently on maximal ventilatory support with 100% FiO2, high PEEP, and oscillator-ventilator. His PO2 is 41 and chest x-ray shows whiteout of both lungs consistent with ARDS. He is in acute renal failure as well. He had a respiratory and cardiac arrest with the past 24 hours and required several minutes of CPR before regaining adequate hemodynamics. He is currently off pressors His neurologic status currently is not clear because heof  his medically induced sedation and paralysis however after the resuscitation he had poor neurologic response before he was heavily sedated. The patient is been on the ventilator for 2 weeks. He has had chronic pulmonary problems over the past 2 months and is currently on his second tracheostomy. He has underlying restrictive lung disease and swallow dysfunction with gastrostomy tube. Objective: Vital signs in last 24 hours: Temp:  [98.6 F (37 C)-101.9 F (38.8 C)] 98.6 F (37 C) (05/06 1230) Pulse Rate:  [35-132] 106 (05/06 1300) Cardiac Rhythm:  [-] Sinus tachycardia (05/06 1200) Resp:  [0-35] 0 (05/06 1300) BP: (70-180)/(29-106) 124/79 mmHg (05/06 1300) SpO2:  [67 %-100 %] 85 % (05/06 1300) FiO2 (%):  [100 %] 100 % (05/06 1200) Weight:  [146 lb 2.6 oz (66.3 kg)] 146 lb 2.6 oz (66.3 kg) (05/06 0500)  Hemodynamic parameters for last 24 hours: CVP:  [21 mmHg] 21 mmHg  Intake/Output from previous day: 05/05 0701 - 05/06 0700 In: 3659 [I.V.:3379; IV Piggyback:250] Out: 3275 [Urine:3275] Intake/Output this shift: Total I/O In: 918.6 [I.V.:618.6; Other:50; IV Piggyback:250] Out: 510 [Urine:510]   Review of systems  Review of Systems  :Patient  nonverbal on ventilator, review of systems obtained from chart  General:  No weight loss   no fever   no decreased energy  no night sweats Cardiac: - Chest pain with exertion - - resting chest pain +  SOB with exertion +  Orthopnea                  -PND  ankle edema  -syncope Pulmonary: +  dyspnea,no cough, + productive cough no home oxygen no hemoptysis GI: + difficulty swallowing + PEG + GERD no jaundice  no melena  no hematemesis no        abdominal pain GU:  no dysuria  no hematuria  no frequent UTI no BPH Vascular:  no claudication  No TIA  No varicose veins no DVT Neuro:  no sroke + seizures  + tremors no TIA no head trauma no vision changes Musculoskeletal: - normal mobility no arthritis  no gout  no joint swelling Skin: no rash  no skin ulceration  no skin cancer Endocrine: -diabetes  no thyroid didease Hematologic: no easy bruising  no blood transfusions  no frequent epistaxis ENT : no painful teeth no dentures no loose teeth Psych : no anxiety  no depression  o psych hospitalizations    exam Young AA male sedated on ventilator  Sedated unresponsive Distant breath sounds difficult here due to the oscillator -ventilator noise Sinus tachycardia Trace pulses palpable Pupils small nonreactive Lab Results:  Recent Labs  06/10/14 0404 07/01/2014 0347  WBC 15.1* 14.3*  HGB 10.2* 10.9*  HCT 34.6* 36.0*  PLT 282 224   BMET:  Recent Labs  06/10/14 1349  06/21/2014 0347  NA 154* 154*  K 3.4* 2.3*  CL 114* 117*  CO2 31 26  GLUCOSE 130* 110*  BUN 28* 34*  CREATININE 3.81* 3.55*  CALCIUM 7.3* 7.3*    PT/INR: No results for input(s): LABPROT, INR in the last 72 hours. ABG    Component Value Date/Time   PHART 7.567* 06/07/2014 1200   HCO3 25.6* 07/03/2014 1200   TCO2 22.7 06/30/2014 1200   ACIDBASEDEF 5.6* 06/01/2014 0900   O2SAT 78.8 06/07/2014 1200   CBG (last 3)   Recent Labs  06/10/14 2340 07/01/2014 0146 06/08/2014 0750  GLUCAP 78 92 84     Assessment/Plan: S/P Procedure(s) (LRB): TRACHEOSTOMY (N/A) Impression-recommendation  The patient would not benefit of ECMO as additional support because of his prolonged course of the ventilator support-14 days. Patient also has multisystem failure so the predicted benefit from  would be negligible. This was discussed with the patient's family and they understand and agree that adding ECMOto his current support would be futile   LOS: 19 days    Kathlee Nationseter Van Trigt III 06/29/2014

## 2014-07-07 NOTE — Progress Notes (Signed)
eLink Physician-Brief Progress Note Patient Name: [name] DOB: 08/01/1990 MRN: [id number]   Date of Service  06/08/2014  HPI/Events of Note  PH 7.62 on current settings, pO2 not at goal  eICU Interventions  - will change Pmean to 35 - leave power and Hz at current settings and put cuff down - follow abg     Intervention Category Major Interventions: Respiratory failure - evaluation and management  [name] S. 07/04/2014, 12:32 AM

## 2014-07-07 NOTE — Progress Notes (Signed)
Patient Profile: 24 year old functional gentleman with a history of club foot, hemiatrophy of RLE, Klippel Feil Syndrome, scoliosis s/p harrington rod surgery (2003), restrictive lung physiology, and Tourette's syndrome. He is status post a prior admission with acute respiratory failure secondary to pneumonia and had a complicated hospital course with ARDS, delirium, acute kidney injury, and he required prolonged ventilatory support and underwent tracheostomy. Following discharge, he was readmitted on 11-10-2014 with worsening mental status and respiratory failure requiring reintubation. He has developed worsening renal function and hypotension.4/24 he was receiving oral care when he became agitated and HR slowed to 40, code blue called. He received 2 amps of Epi and approximately 5 minutes of CPR.  Echo 4/24 demonstrated new LVD w/ EF of 30-35%. 5/5 he had his second inpatient PEA arrest requiring Epi and 6 minutes of CPR. Now with refractory ARDS requiring HFO.  Marland Kitchen. antiseptic oral rinse  7 mL Mouth Rinse QID  . artificial tears  1 application Both Eyes 3 times per day  . bacitracin  1 application Topical 3 times per day  . budesonide (PULMICORT) nebulizer solution  0.5 mg Nebulization BID  . chlorhexidine  15 mL Mouth Rinse BID  . Crash Cart Bulk Charge  1 each Does not apply Once  . dexmethylphenidate  10 mg Per Tube BID  . docusate  100 mg Per Tube BID  . feeding supplement (VITAL HIGH PROTEIN)  1,000 mL Per Tube Q24H  . fentaNYL (SUBLIMAZE) injection  100 mcg Intravenous Once  . free water  250 mL Per Tube Q6H  . heparin subcutaneous  5,000 Units Subcutaneous 3 times per day  . insulin aspart  0-9 Units Subcutaneous 6 times per day  . ipratropium-albuterol  3 mL Nebulization Q6H  . midazolam  2 mg Intravenous Once  . pantoprazole sodium  40 mg Per Tube Q1200  . piperacillin-tazobactam (ZOSYN)  IV  3.375 g Intravenous Q8H  . sodium chloride  3 mL Intravenous Q12H  . [START ON 06/12/2014]  vancomycin  1,000 mg Intravenous Q48H   . cisatracurium (NIMBEX) infusion 3.5 mcg/kg/min (06/16/2014 0700)  . dextrose 75 mL/hr at 06/06/2014 0908  . fentaNYL infusion INTRAVENOUS 400 mcg/hr (07/06/2014 1057)  . midazolam (VERSED) infusion 8 mg/hr (06/08/2014 0754)  . norepinephrine (LEVOPHED) Adult infusion    . phenylephrine (NEO-SYNEPHRINE) Adult infusion Stopped (06/10/14 2121)   Subjective: Sedated.   Objective: Vital signs in last 24 hours: Temp:  [99 F (37.2 C)-101.9 F (38.8 C)] 99 F (37.2 C) (05/06 0400) Pulse Rate:  [35-133] 108 (05/06 0700) Resp:  [0-45] 0 (05/06 0700) BP: (59-180)/(19-106) 127/74 mmHg (05/06 0700) SpO2:  [67 %-100 %] 83 % (05/06 0700) FiO2 (%):  [70 %-100 %] 100 % (05/06 0613) Weight:  [146 lb 2.6 oz (66.3 kg)] 146 lb 2.6 oz (66.3 kg) (05/06 0500) Last BM Date: 06/10/14  Intake/Output from previous day: 05/05 0701 - 05/06 0700 In: 3659 [I.V.:3379; IV Piggyback:250] Out: 3275 [Urine:3275] Intake/Output this shift:    Medications   PE: General appearance: sedated Neck: no JVD Lungs: lung sounds no audible due to noise of HFO Heart: heart sounds not audible due to noise of HFO Extremities: bilateral edema Skin: warm and dry Neurologic: sedated Lab Results:   Recent Labs  06/09/14 0447 06/10/14 0404 06/30/2014 0347  WBC 15.9* 15.1* 14.3*  HGB 10.4* 10.2* 10.9*  HCT 33.9* 34.6* 36.0*  PLT 330 282 224   BMET  Recent Labs  06/10/14 0404 06/10/14 1349 06/08/2014 0347  NA 156*  154* 154*  K 2.9* 3.4* 2.3*  CL 109 114* 117*  CO2 33* 31 26  GLUCOSE 104* 130* 110*  BUN 28* 28* 34*  CREATININE 3.51* 3.81* 3.55*  CALCIUM 7.9* 7.3* 7.3*     Assessment/Plan  Active Problems:   Attention deficit disorder   Tics of organic origin   Acute encephalopathy   Tourettes syndrome   Sepsis   Abdominal pain   Aspiration pneumonia   Atelectasis of left lung   AKI (acute kidney injury)   Hypokalemia   Ileus   Cardiac arrest  1. Cardiac  Arrest: Patient suffered his 2nd PEA arrest yesterday. HR dropped into the 30s. Required Epi and 6 min of CPR.HR now in the low 100s.    2. LV Dysfunction: EF 30-35%. New compared to prior Echo in 03/2014. Troponin trend has been flat 0.18-->0.18--> 0.17. Not a candidate for ACE/ARB therapy secondary to renal insufficiency with Scr at 3.55. No BB due to recent brady arrest. Continue to hold BiDil.   3. Hypokalemia: K 2.6 this am. Primary team aware. Receiving IV K. Supplement Mg if needed.   4. ARDS: refractory. Now sedated and on HFO.   5. Renal Failure: SCr 3.55.  management per primary team   Overall prognosis is poor given multiorgan failure with refractory ARDS requiring high frequency oscillation, severe LV dysfunction and renal failure. Nothing more for cardiology to add at this point. Call if any further assistance needed.     LOS: 19 days   Brittainy M. Delmer IslamSimmons, PA-C 06/26/2014 7:40 AM  The patient was seen, examined and discussed with Brittainy M. Sharol HarnessSimmons, PA-C and I agree with the above.   24 year old male with history of club foot, hemiatrophy of RLE, Klippel Feil Syndrome, scoliosis s/p harrington rod surgery (2003), restrictive lung physiology, and Tourette's syndrome admitted on 05/24/2014 with acute respiratory failure, , prolonged ventilatory support, now s/p second PEA yesterday with bradycardia sec to multiorgan failure including ARDS, kidney failure. The patient is in sinus tachycardia in low 100' sec to pressors. He was previously significantly fluid overloaded, now significantly improved - down 20 lbs. New decrease in LVEF from Feb 2016 with minimal troponin elevation in the settings of kidney failure and hypoxia. Unable to use ACEI/ARB at this point.  There is not much cardiology can add at this point as it seems that the mainstay of therapy is underlying lung and kidney problems. Please call us with any questions.   Lars MassonELSON, Jilliane Kazanjian H 06/23/2014

## 2014-07-07 NOTE — Progress Notes (Signed)
10cc of 50mg  in 50mL Versed wasted in sink with Lissa MerlinMichael Morgan, RN.   Starla Linkarolyn J Shelba Susi, RN

## 2014-07-07 NOTE — Progress Notes (Signed)
Follow up ABG post HFOV changes reviewed. PaO2 decreased to 41.  Discussed ABG with attending MD.     Plan: Reduce Frequency to 6  Repeat ABG in one hour   Canary BrimBrandi Shyvonne Chastang, NP-C [facility] Pgr: [phone] or [phone]

## 2014-07-07 NOTE — Progress Notes (Signed)
Pt transitioned from ventilator to trach collar for comfort at 1615. Pt expired at 1630. Heart and breath sounds auscultated by myself and Lissa MerlinMichael Morgan, RN. No heart or breath sounds heard. [facility] notified. CDS had already consulted on pt and pt's family declined organ, tissue, and eye donation. CDS notified of time of death. Pt's mother and father and other family members at bedside.   Starla Linkarolyn J Auset Fritzler, RN

## 2014-07-07 NOTE — Progress Notes (Signed)
ANTIBIOTIC CONSULT NOTE - INITIAL  Pharmacy Consult for Vancomycin and Zosyn Indication: Possible bacteremia/line sepsis, aspiration pneumonia  No Known Allergies  Patient Measurements: Height: 4\' 11"  (149.9 cm) Weight: 146 lb 2.6 oz (66.3 kg) IBW/kg (Calculated) : 47.7  Vital Signs: Temp: 99 F (37.2 C) (05/06 0400) Temp Source: Axillary (05/06 0400) BP: 143/73 mmHg (05/06 0754) Pulse Rate: 106 (05/06 0754) Intake/Output from previous day: 05/05 0701 - 05/06 0700 In: 3659 [I.V.:3379; IV Piggyback:250] Out: 3275 [Urine:3275] Intake/Output from this shift:    Labs:  Recent Labs  06/09/14 0447 06/10/14 0404 06/10/14 1349 06/18/2014 0347  WBC 15.9* 15.1*  --  14.3*  HGB 10.4* 10.2*  --  10.9*  PLT 330 282  --  224  CREATININE 3.76* 3.51* 3.81* 3.55*   Estimated Creatinine Clearance: 25.2 mL/min (by C-G formula based on Cr of 3.55). No results for input(s): VANCOTROUGH, VANCOPEAK, VANCORANDOM, GENTTROUGH, GENTPEAK, GENTRANDOM, TOBRATROUGH, TOBRAPEAK, TOBRARND, AMIKACINPEAK, AMIKACINTROU, AMIKACIN in the last 72 hours.   Microbiology: Recent Results (from the past 720 hour(s))  Blood Culture (routine x 2)     Status: None   Collection Time: 04-27-2014 10:30 AM  Result Value Ref Range Status   Specimen Description BLOOD RIGHT HAND  [facility]  Final   Special Requests NONE  Final   Culture   Final    NO GROWTH 5 DAYS [facility]formed [facility]    Report Status 05/29/2014 FINAL  Final  Blood Culture (routine x 2)     Status: None   Collection Time: 04-27-2014 11:02 AM  Result Value Ref Range Status   Specimen Description BLOOD RIGHT HAND  Final   Special Requests BOTTLES DRAWN AEROBIC AND ANAEROBIC 5 CC EACH  Final   Culture   Final    NO GROWTH 5 DAYS Performed at [facility]    Report Status 05/29/2014 FINAL  Final  Urine culture     Status: None   Collection Time: 04-27-2014  1:16 PM  Result Value Ref Range Status   Specimen Description  URINE, CLEAN CATCH  Final   Special Requests NONE  Final   Colony Count NO GROWTH Performed at [facility]   Final   Culture NO GROWTH Performed at [facility]   Final   Report Status 05/24/2014 FINAL  Final  MRSA PCR Screening     Status: None   Collection Time: 05/26/14  9:45 AM  Result Value Ref Range Status   MRSA by PCR NEGATIVE NEGATIVE Final    Comment:        The GeneXpert MRSA Assay (FDA approved for NASAL specimens only), is one component of a comprehensive MRSA colonization surveillance program. It is not intended to diagnose MRSA infection nor to guide or monitor treatment for MRSA infections.   Culture, respiratory (NON-Expectorated)     Status: None   Collection Time: 05/26/14  4:30 PM  Result Value Ref Range Status   Specimen Description TRACHEAL ASPIRATE  Final   Special Requests NONE  Final   Gram Stain   Final    RARE WBC PRESENT, PREDOMINANTLY PMN NO SQUAMOUS EPITHELIAL CELLS SEEN NO ORGANISMS SEEN Performed at [facility]    Culture   Final    Non-Pathogenic Oropharyngeal-type Flora Isolated. Performed at [facility]    Report Status 05/29/2014 FINAL  Final  Culture, blood (routine x 2)     Status: None (Preliminary result)   Collection Time: 06/09/14  9:35  AM  Result Value Ref Range Status   Specimen Description BLOOD LEFT ARM  Final   Special Requests BOTTLES DRAWN AEROBIC ONLY 1.5CC  Final   Culture   Final           BLOOD CULTURE RECEIVED NO GROWTH TO DATE CULTURE WILL BE HELD FOR 5 DAYS BEFORE ISSUING A FINAL NEGATIVE REPORT Performed at [facility]    Report Status PENDING  Incomplete  Culture, blood (routine x 2)     Status: None (Preliminary result)   Collection Time: 06/09/14  9:40 AM  Result Value Ref Range Status   Specimen Description BLOOD RIGHT ARM  Final   Special Requests BOTTLES DRAWN AEROBIC ONLY 5CC  Final   Culture   Final           BLOOD CULTURE RECEIVED NO GROWTH TO DATE  CULTURE WILL BE HELD FOR 5 DAYS BEFORE ISSUING A FINAL NEGATIVE REPORT Performed at [facility]    Report Status PENDING  Incomplete  Culture, respiratory (NON-Expectorated)     Status: None (Preliminary result)   Collection Time: 06/10/14 10:57 AM  Result Value Ref Range Status   Specimen Description TRACHEAL ASPIRATE  Final   Special Requests NONE  Final   Gram Stain PENDING  Incomplete   Culture   Final    FEW GRAM NEGATIVE RODS Performed at [facility]    Report Status PENDING  Incomplete    Medical History: Past Medical History  Diagnosis Date  . Tourette's syndrome   . Tic disorder   . Scoliosis   . Hemiatrophy of right leg   . Club foot     Right  . Mental retardation     mild    Assessment: 89 y/oM with PMH of Tourette's syndrome,Klippel-Feil syndrome, scoliosis who [facility], initially placed on antibiotics for sepsis/HCAP. Pt subsequently developed hypercarbic respiratory failure requiring intubation. Patient received contrast for CT on 05/24/14 and subsequently developed non-oliguric AKI.  Significant Events: 5/4: Fever, CVL removed, Vanc started for possible line sepsis. 5/5: Code blue called after pt found disconnected from vent, unresponsive.  Today, 07/05/2014:  Afebrile now. WBCs elevated but improving. SCr slightly better at 3.55, CrCl 25CG, 33N. Good UOP as of 5/5.  4/17 >> Vancomycin >> 4/23 4/17 >> Zosyn >> 4/23  5/4 >> Vancomycin >> 5/6 >> Zosyn >>  4/17 blood x 2: NGF 4/17 urine: NGF 4/20 MRSA PCR: neg 4/20 trach aspirate: non-pathogenic oropharyngeal flora 5/4 blood x 2: NGTD 5/5 trach asp: few GN rods  Goal of Therapy:  Vancomycin trough level 15-20 mcg/ml  Appropriate antibiotic dosing for renal function; eradication of infection  Plan:  Cont Vanc 1g IV q48h, starting on 5/7.   Plan for Vancomycin level in near future, given AKI and previous hx of supratherapeutic levels. Start Zosyn 3.375g  IV Q8H infused over 4hrs. Follow up renal fxn, culture results, and clinical course.  Charolotte Ekeom Vander Kueker, PharmD, pager [phone]. 06/19/2014,8:33 AM.

## 2014-07-07 NOTE — Progress Notes (Signed)
Chaplain visited patient on yesterday because he coded. There was no family present at the time. Chaplain visited family today. Chaplain spoke with family and provided emotional support.   08/02/2014 1500  Clinical Encounter Type  Visited With Patient;Family;Patient and family together  Visit Type Follow-up  Referral From Other (Comment)

## 2014-07-07 NOTE — Progress Notes (Signed)
200cc of 2500 mcg in 250 ml Fentanyl gtt and 15cc of 50mg  in 50 ml Versed gtt wasted in sink with Lezlie LyeLisa Frei, RN.   Starla Linkarolyn J Deziray Nabi, RN

## 2014-07-07 DEATH — deceased

## 2016-05-09 ENCOUNTER — Telehealth: Payer: Self-pay | Admitting: Pulmonary Disease

## 2016-05-09 IMAGING — DX DG CHEST 1V PORT
1 series · 1 of 1 positions shown · non-contrast
Comparison: Chest CT 05/24/2014 and earlier.

CLINICAL DATA: 23-year-old male with shortness of breath since this
morning. Initial encounter.

EXAM:
PORTABLE CHEST - 1 VIEW

[chest ap]
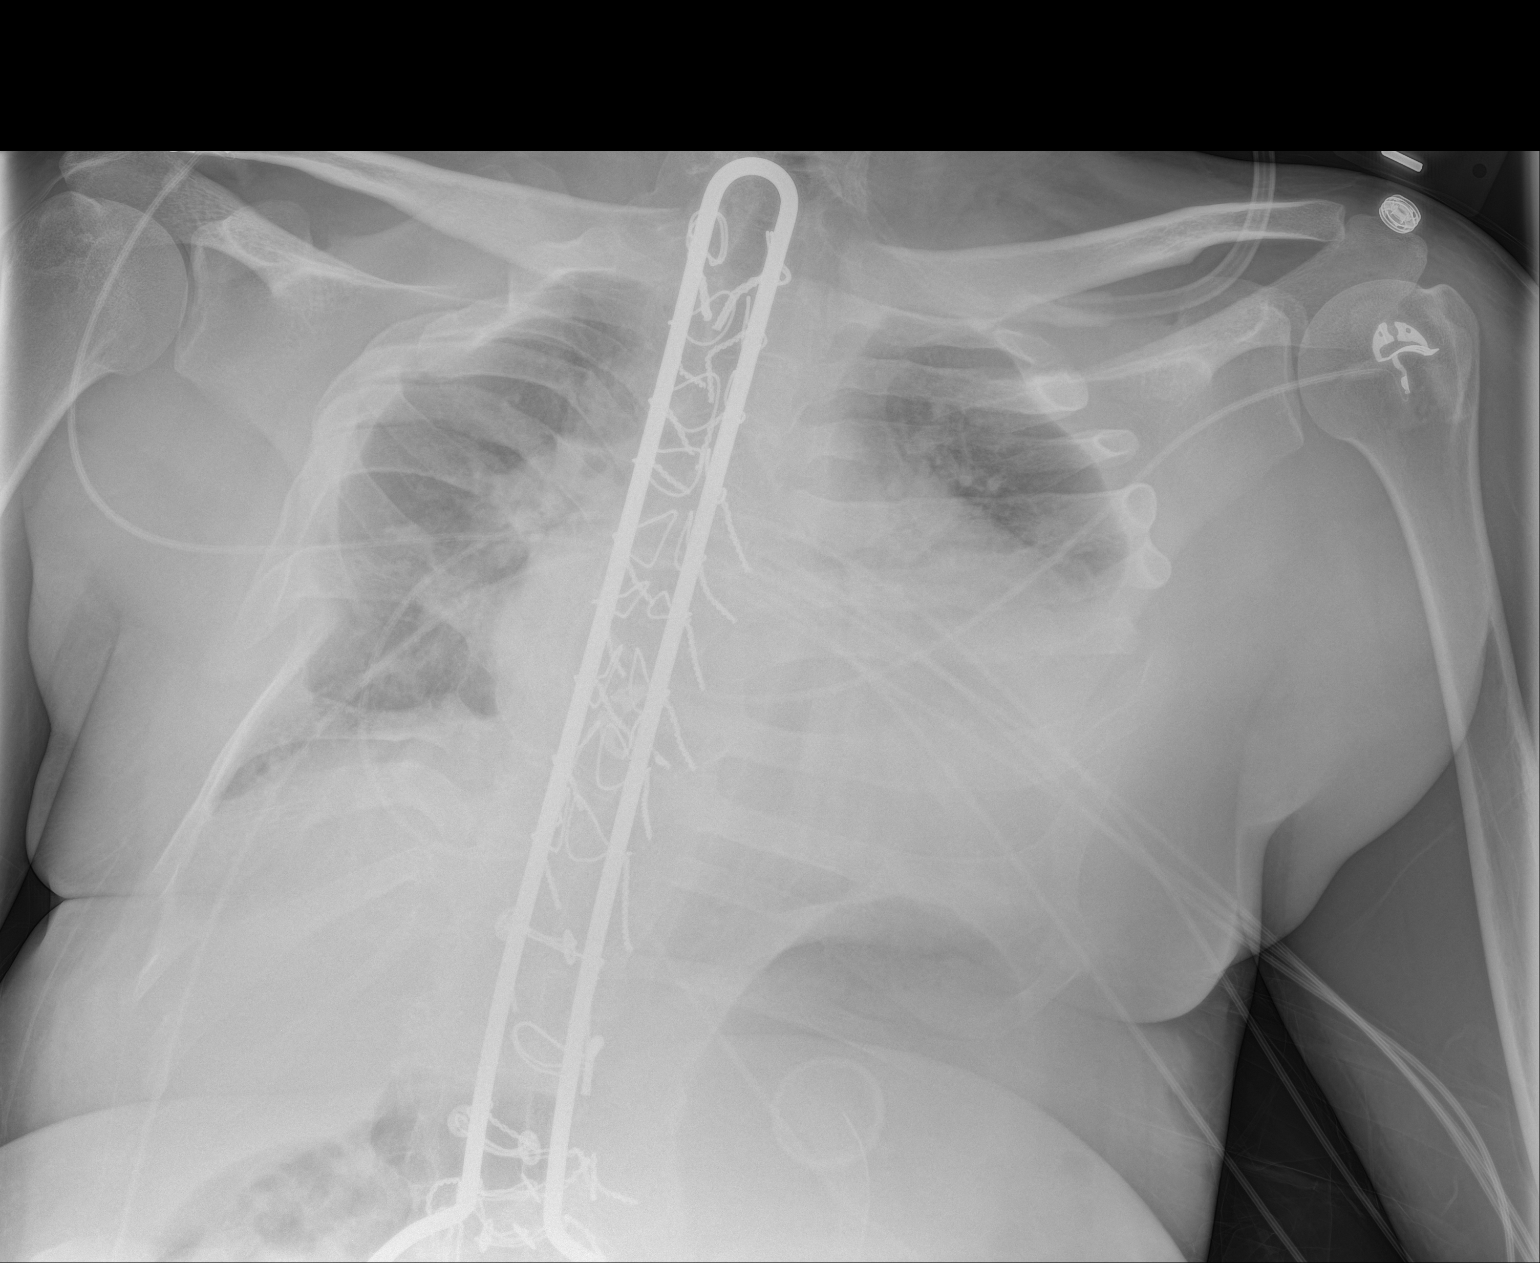

[1 of 1 positions shown; findings below may reference images not displayed]

FINDINGS: Portable AP semi upright view at 2929 hours. Chronic/congenital bone
dysplasia of the thorax including discontinuous and conjoined ribs
better demonstrated on the recent CT. Posterior spinal rods appears
stable and intact. Cardiomegaly and mediastinal contours are stable.
Stable lung parenchyma. No pneumothorax or acute pulmonary opacity
identified.

Left upper quadrant percutaneous gastrostomy re- identified.
Increased proximal stomach gas.
IMPRESSION: Stable.  No acute cardiopulmonary abnormality.

## 2016-05-09 IMAGING — DX DG CHEST 1V PORT
2 series · 2 of 2 positions shown · non-contrast
Comparison: 05/26/2014 at [DATE] p.m. and 05/23/2014 and CT scan of
the chest dated 05/24/2014

CLINICAL DATA: Endotracheal tube placement and right IJ central
line placement. Pneumonia. Aspiration.

EXAM:
PORTABLE CHEST - 1 VIEW [DATE] p.m.

[chest ap (1 of 2)]
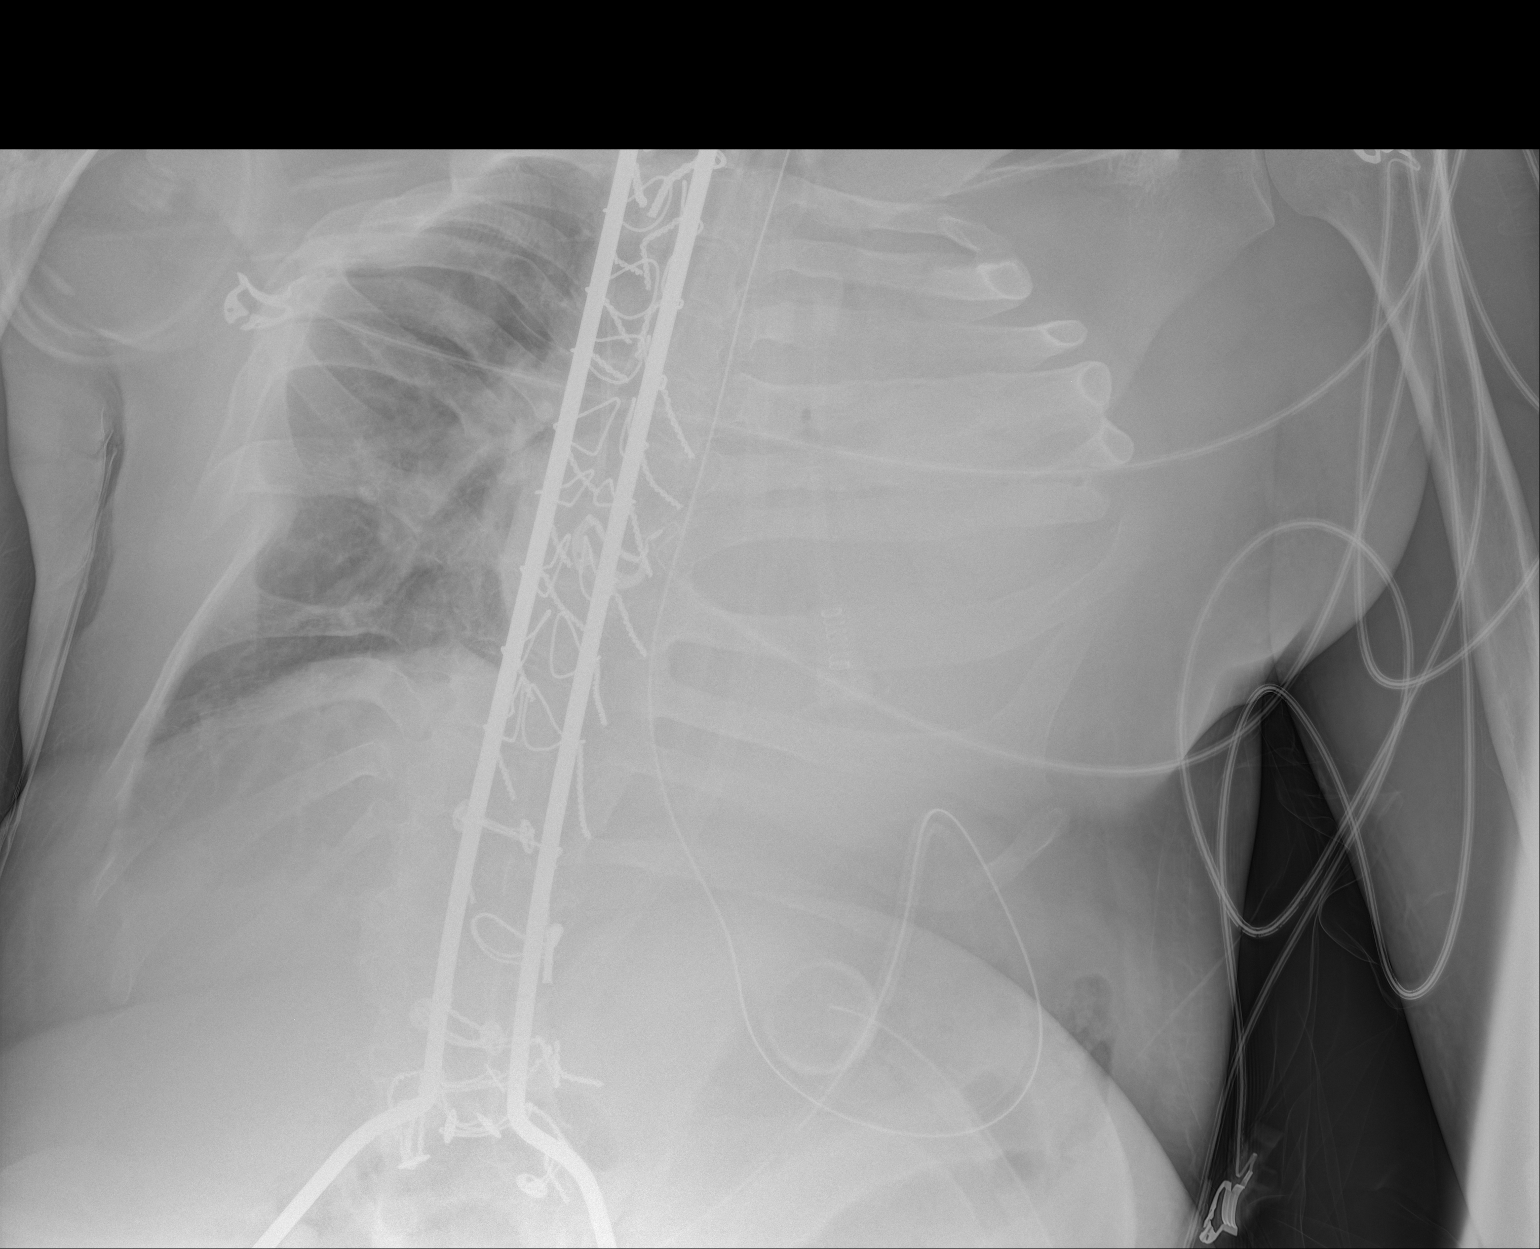

[chest ap (2 of 2)]
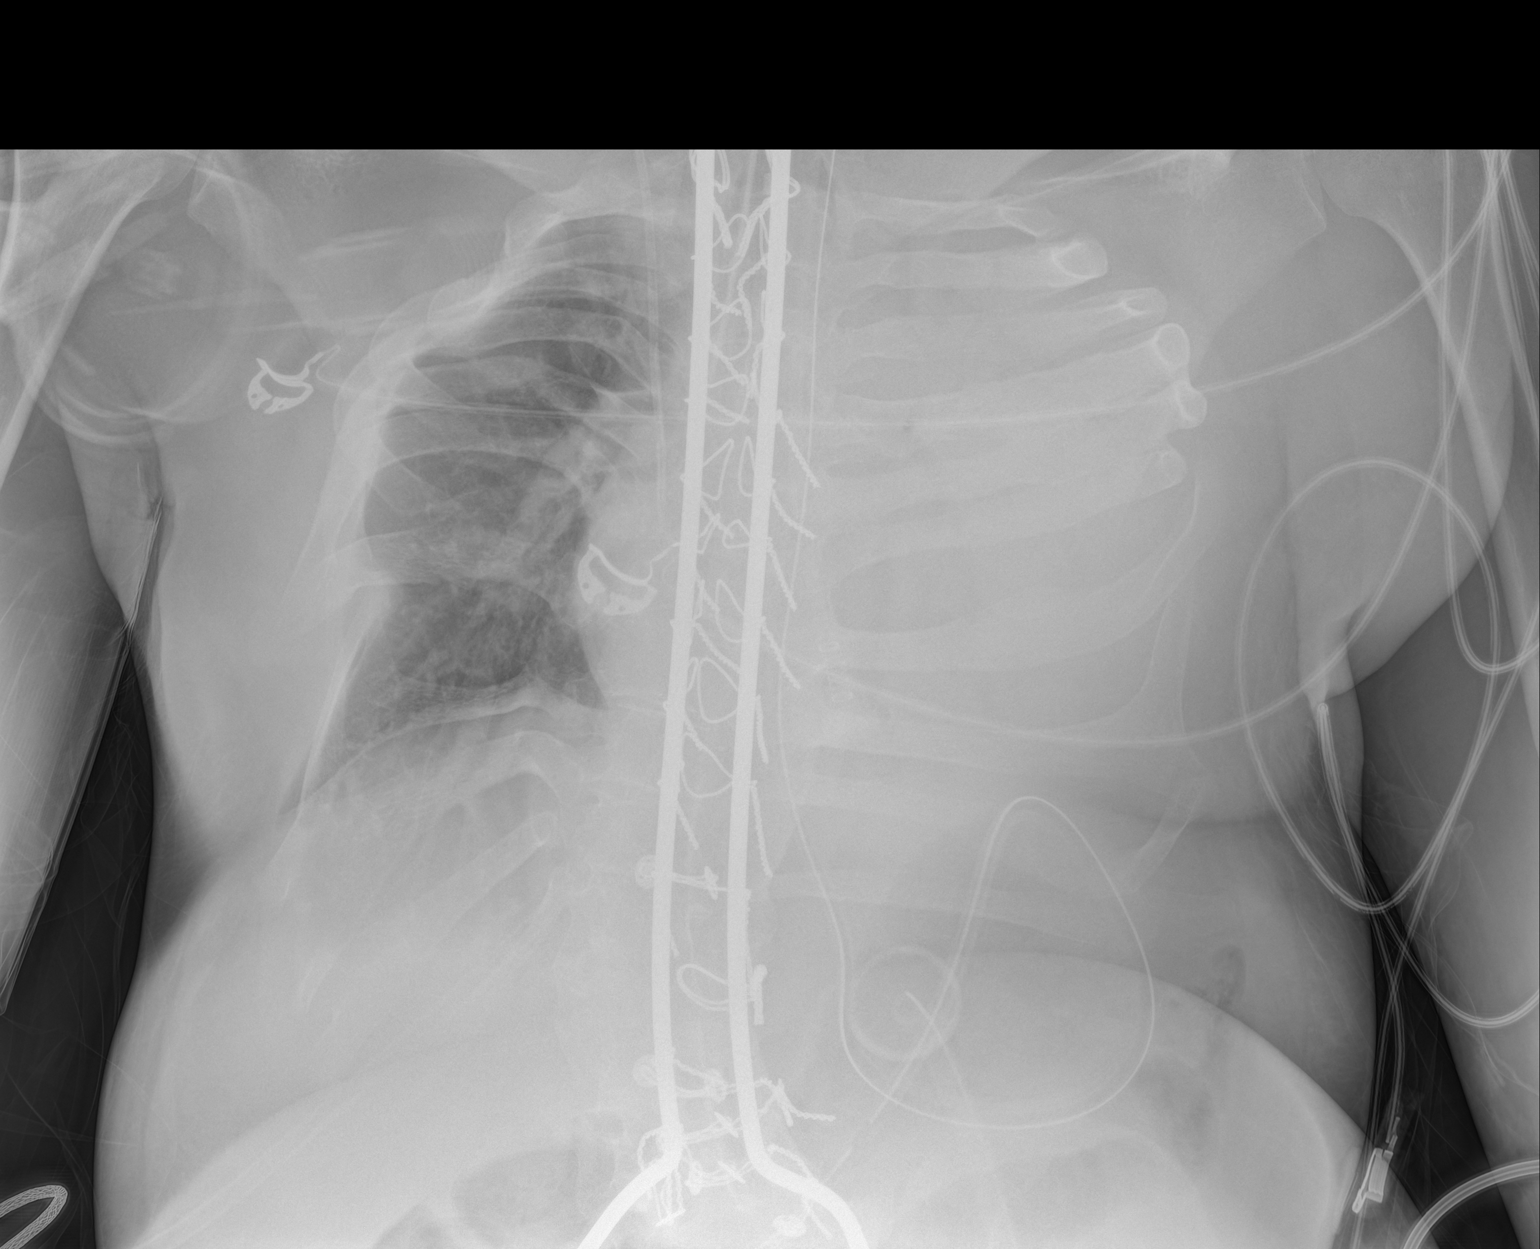

[2 of 2 positions shown; findings below may reference images not displayed]

FINDINGS: Endotracheal tube tip is at the T4 level at the carina. I recommend
retracting approximately 2 cm. OG tube tip is in the distal stomach.
IJ catheter tip is just above the cavoatrial junction in good
position. No pneumothorax.

There is new complete opacification of the left hemi thorax with
some air bronchograms.

No acute abnormalities of the right lung. Chronic deformity of the
bony thorax.

Gastrostomy tube in place.  Spinal rods and wires in place.
IMPRESSION: 1. Endotracheal tube is at the carina and needs to be retracted
approximately 2 cm.
2. IJ catheter appears in good position with no pneumothorax.
3. New complete opacification of the left lung.

Critical Value/emergent results were called by telephone at the time
of interpretation on 05/26/2014 at [DATE] to Ylsi Manthjo, RN , who
verbally acknowledged these results.

## 2016-05-09 NOTE — Telephone Encounter (Signed)
Form has been given to RA. Will await his signature.

## 2016-05-11 IMAGING — DX DG CHEST 1V PORT
1 series · 1 of 1 positions shown · non-contrast
Comparison: 05/27/2014

CLINICAL DATA: Respiratory failure.

EXAM:
PORTABLE CHEST - 1 VIEW

[chest ap]
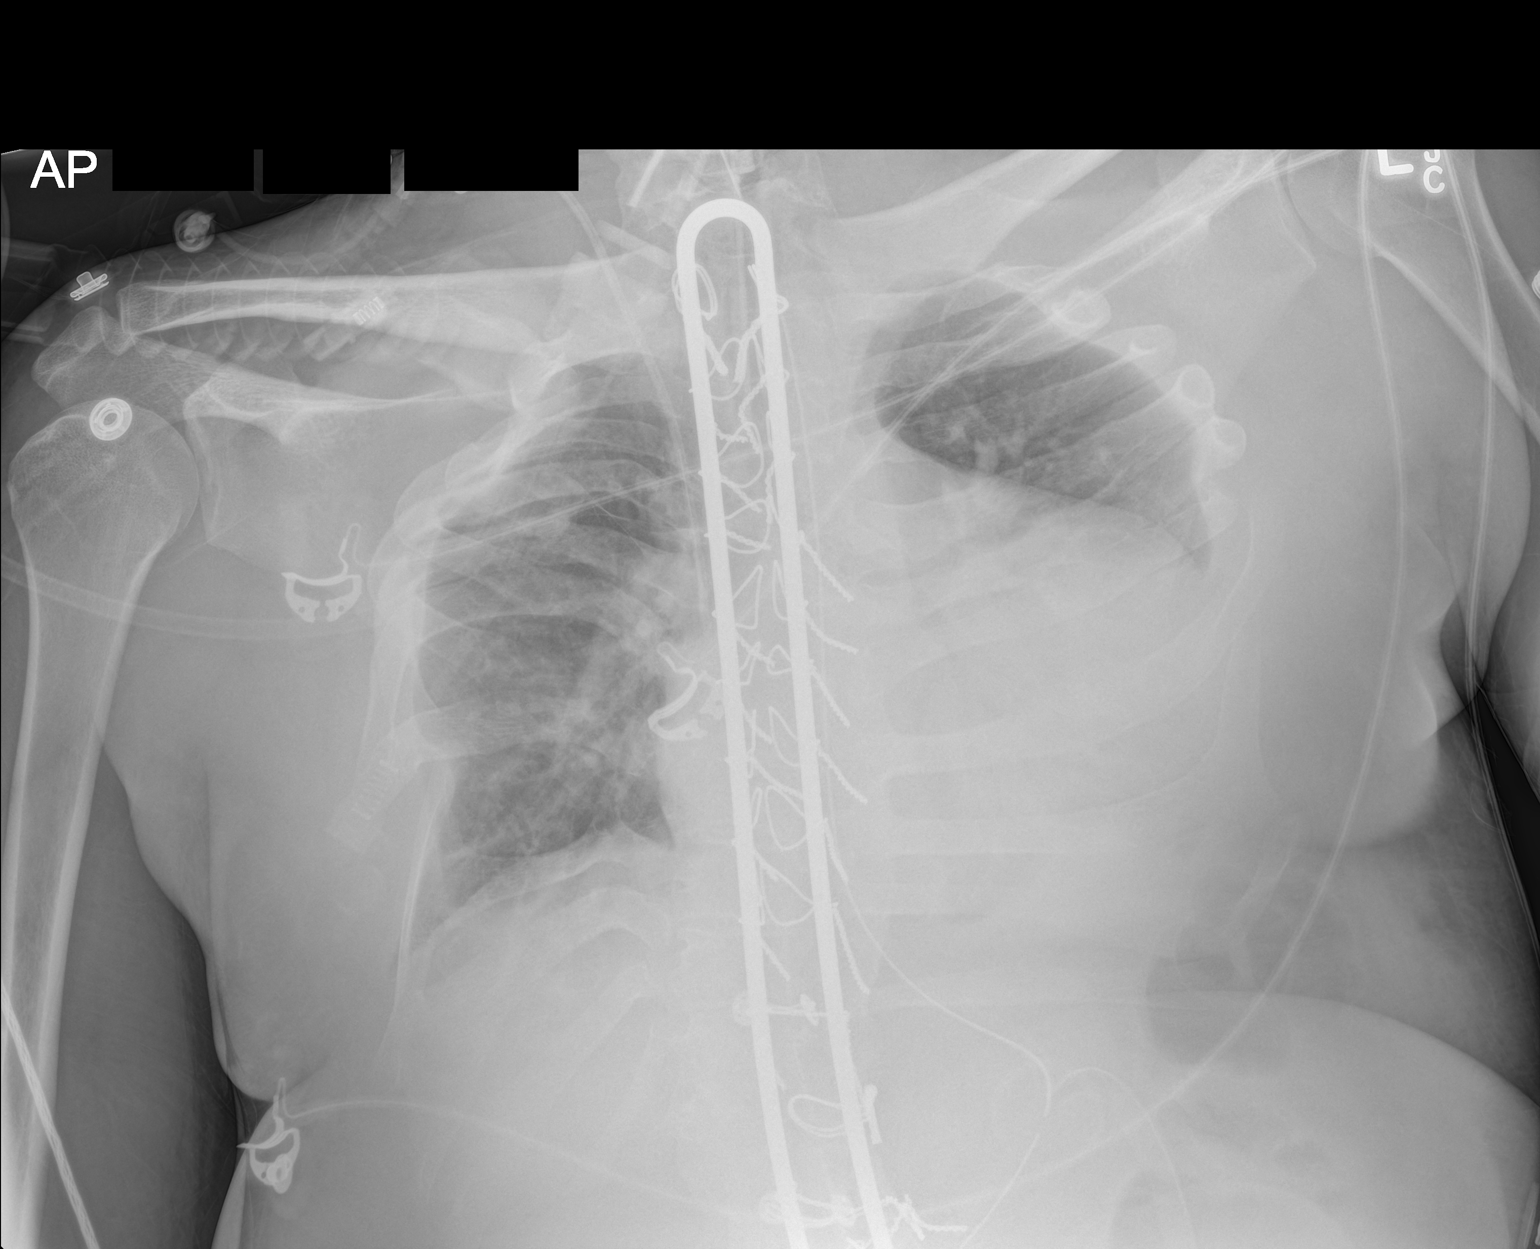

[1 of 1 positions shown; findings below may reference images not displayed]

FINDINGS: Support devices are in stable position. Cardiomegaly. Bilateral
airspace opacities, most pronounced in the left lower lobe are
unchanged. Congenital deformity of the thoracic cage, stable.
IMPRESSION: No significant change.

## 2016-05-11 IMAGING — DX DG CHEST 1V PORT
1 series · 1 of 1 positions shown · non-contrast
Comparison: 05/28/2014 at [DATE] a.m.

CLINICAL DATA: Endotracheal tube placement.

EXAM:
PORTABLE CHEST - 1 VIEW [DATE] p.m.

[chest ap]
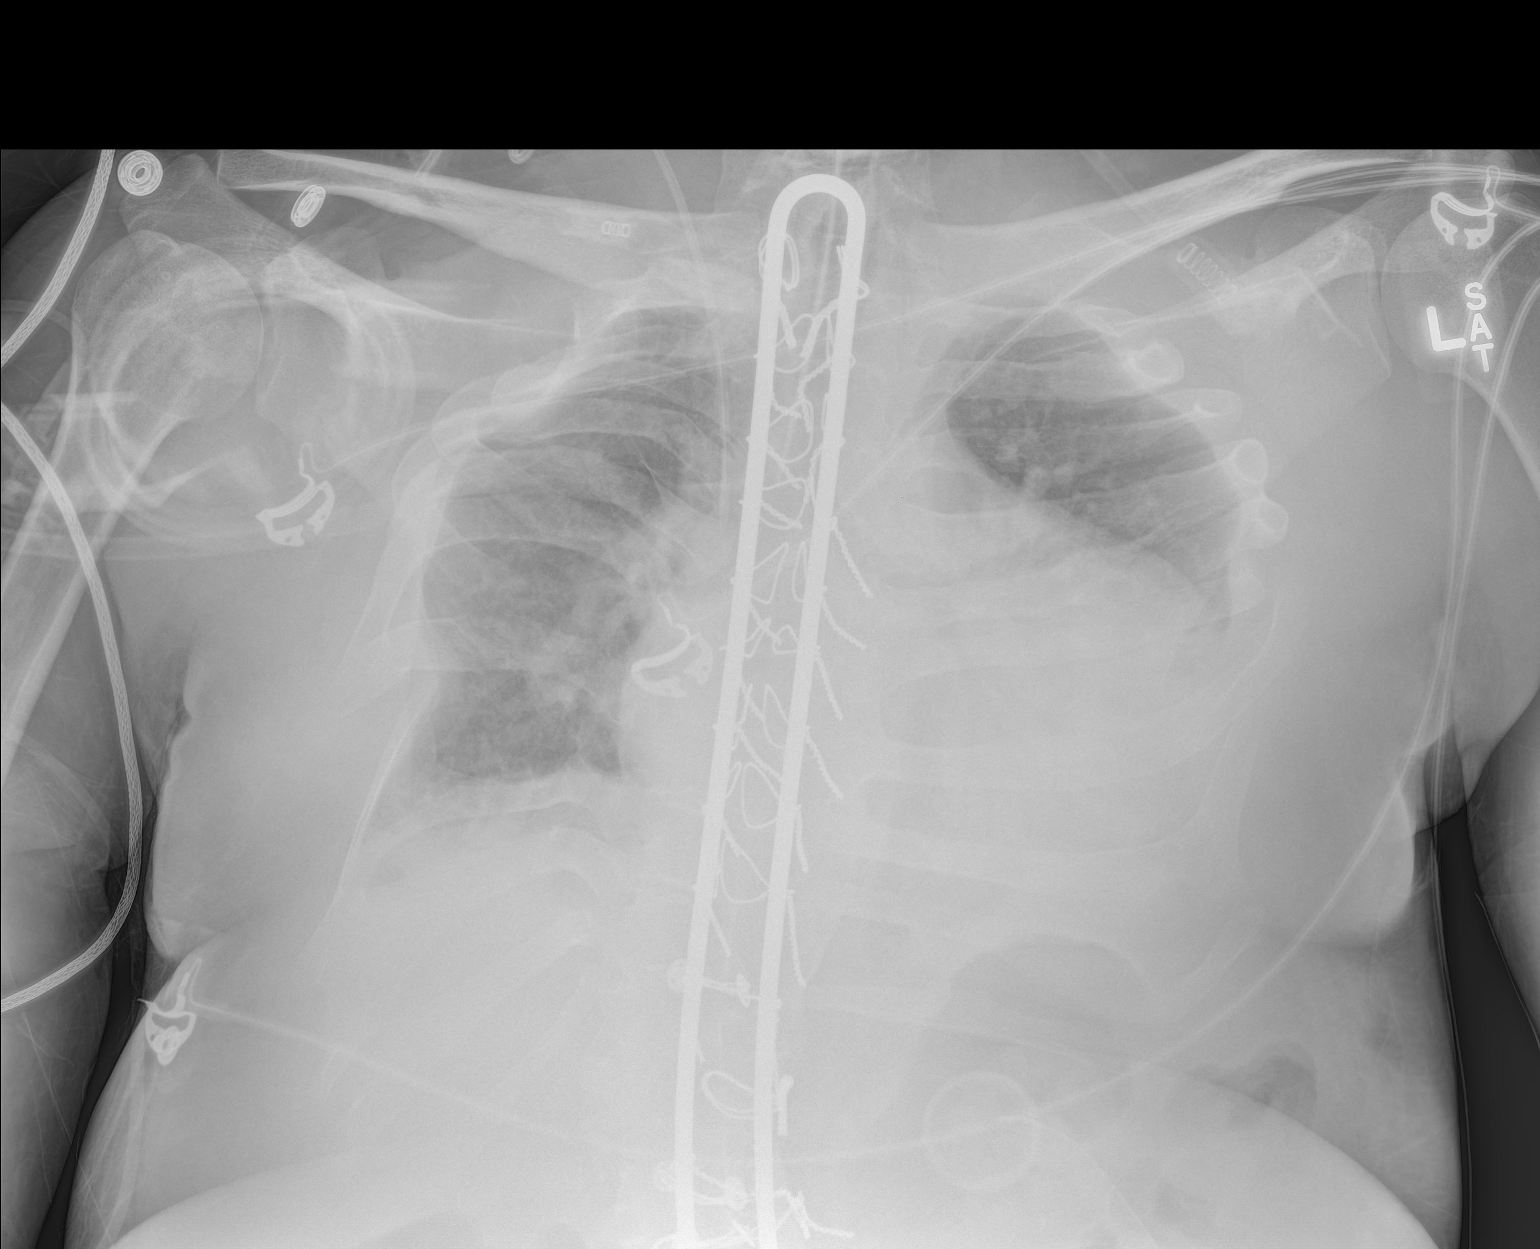

[1 of 1 positions shown; findings below may reference images not displayed]

FINDINGS: The endotracheal tube is approximately 15 mm above the carina. Right
central line tip is in the superior vena cava at the level of the
azygos vein. NG tube has been removed.

Cardiomegaly. No change in the appearance of the lungs. No acute
infiltrates or effusions. Marked chronic deformity of the thorax.
IMPRESSION: Endotracheal tube is 15 mm above the carina.  Lungs are unchanged.

## 2016-05-15 NOTE — Telephone Encounter (Signed)
Rec'd completed forms - fwd to [employer] via interoffice mail - 05/15/16 -pr  °

## 2016-05-15 NOTE — Telephone Encounter (Signed)
Paperwork has been signed. Will give papers to [facility].
